# Patient Record
Sex: Female | Born: 1965 | Race: Black or African American | Hispanic: No | Smoking: Never smoker
Health system: Southern US, Community
[De-identification: ages and names within clinical notes are randomized; demographics above are authoritative.]

## PROBLEM LIST (undated history)

## (undated) DIAGNOSIS — G8929 Other chronic pain: Secondary | ICD-10-CM

## (undated) DIAGNOSIS — F450 Somatization disorder: Secondary | ICD-10-CM

## (undated) DIAGNOSIS — R112 Nausea with vomiting, unspecified: Secondary | ICD-10-CM

## (undated) DIAGNOSIS — I10 Essential (primary) hypertension: Secondary | ICD-10-CM

## (undated) DIAGNOSIS — K219 Gastro-esophageal reflux disease without esophagitis: Secondary | ICD-10-CM

## (undated) DIAGNOSIS — E1110 Type 2 diabetes mellitus with ketoacidosis without coma: Secondary | ICD-10-CM

## (undated) DIAGNOSIS — E079 Disorder of thyroid, unspecified: Secondary | ICD-10-CM

## (undated) DIAGNOSIS — R109 Unspecified abdominal pain: Secondary | ICD-10-CM

## (undated) DIAGNOSIS — G4730 Sleep apnea, unspecified: Secondary | ICD-10-CM

## (undated) DIAGNOSIS — K3184 Gastroparesis: Secondary | ICD-10-CM

## (undated) DIAGNOSIS — A419 Sepsis, unspecified organism: Secondary | ICD-10-CM

## (undated) DIAGNOSIS — E785 Hyperlipidemia, unspecified: Secondary | ICD-10-CM

## (undated) DIAGNOSIS — R079 Chest pain, unspecified: Secondary | ICD-10-CM

## (undated) DIAGNOSIS — I4891 Unspecified atrial fibrillation: Secondary | ICD-10-CM

## (undated) DIAGNOSIS — E669 Obesity, unspecified: Secondary | ICD-10-CM

## (undated) DIAGNOSIS — D496 Neoplasm of unspecified behavior of brain: Secondary | ICD-10-CM

## (undated) HISTORY — DX: Obesity, unspecified: E66.9

## (undated) HISTORY — DX: Sleep apnea, unspecified: G47.30

## (undated) HISTORY — DX: Unspecified atrial fibrillation: I48.91

## (undated) HISTORY — PX: CHOLECYSTECTOMY: SHX55

## (undated) HISTORY — PX: NASAL SINUS SURGERY: SHX719

## (undated) HISTORY — DX: Hyperlipidemia, unspecified: E78.5

## (undated) HISTORY — DX: Essential (primary) hypertension: I10

## (undated) HISTORY — DX: Neoplasm of unspecified behavior of brain: D49.6

## (undated) HISTORY — DX: Gastro-esophageal reflux disease without esophagitis: K21.9

## (undated) HISTORY — PX: PARTIAL HYSTERECTOMY: SHX80

## (undated) HISTORY — PX: ABDOMINAL HYSTERECTOMY: SHX81

## (undated) HISTORY — DX: Disorder of thyroid, unspecified: E07.9

---

## 2000-12-10 ENCOUNTER — Encounter: Payer: Self-pay | Admitting: Internal Medicine

## 2000-12-10 ENCOUNTER — Ambulatory Visit (HOSPITAL_COMMUNITY): Admission: RE | Admit: 2000-12-10 | Discharge: 2000-12-10 | Payer: Self-pay | Admitting: Internal Medicine

## 2000-12-17 ENCOUNTER — Ambulatory Visit (HOSPITAL_COMMUNITY): Admission: RE | Admit: 2000-12-17 | Discharge: 2000-12-17 | Payer: Self-pay | Admitting: Internal Medicine

## 2000-12-17 ENCOUNTER — Encounter: Payer: Self-pay | Admitting: Internal Medicine

## 2000-12-18 ENCOUNTER — Encounter: Payer: Self-pay | Admitting: Internal Medicine

## 2001-01-13 ENCOUNTER — Emergency Department (HOSPITAL_COMMUNITY): Admission: EM | Admit: 2001-01-13 | Discharge: 2001-01-13 | Payer: Self-pay | Admitting: Emergency Medicine

## 2002-07-09 ENCOUNTER — Encounter: Payer: Self-pay | Admitting: Internal Medicine

## 2002-07-09 ENCOUNTER — Ambulatory Visit (HOSPITAL_COMMUNITY): Admission: RE | Admit: 2002-07-09 | Discharge: 2002-07-09 | Payer: Self-pay | Admitting: Internal Medicine

## 2003-09-20 ENCOUNTER — Emergency Department (HOSPITAL_COMMUNITY): Admission: EM | Admit: 2003-09-20 | Discharge: 2003-09-20 | Payer: Self-pay | Admitting: Emergency Medicine

## 2004-07-22 ENCOUNTER — Ambulatory Visit (HOSPITAL_BASED_OUTPATIENT_CLINIC_OR_DEPARTMENT_OTHER): Admission: RE | Admit: 2004-07-22 | Discharge: 2004-07-22 | Payer: Self-pay | Admitting: Internal Medicine

## 2004-07-27 ENCOUNTER — Ambulatory Visit: Payer: Self-pay | Admitting: Internal Medicine

## 2004-09-15 ENCOUNTER — Emergency Department (HOSPITAL_COMMUNITY): Admission: EM | Admit: 2004-09-15 | Discharge: 2004-09-15 | Payer: Self-pay | Admitting: Family Medicine

## 2004-11-26 ENCOUNTER — Ambulatory Visit (HOSPITAL_COMMUNITY): Admission: RE | Admit: 2004-11-26 | Discharge: 2004-11-26 | Payer: Self-pay | Admitting: Internal Medicine

## 2006-04-03 ENCOUNTER — Emergency Department (HOSPITAL_COMMUNITY): Admission: EM | Admit: 2006-04-03 | Discharge: 2006-04-03 | Payer: Self-pay | Admitting: Emergency Medicine

## 2006-11-13 ENCOUNTER — Emergency Department (HOSPITAL_COMMUNITY): Admission: EM | Admit: 2006-11-13 | Discharge: 2006-11-13 | Payer: Self-pay | Admitting: Emergency Medicine

## 2007-04-04 ENCOUNTER — Emergency Department (HOSPITAL_COMMUNITY): Admission: EM | Admit: 2007-04-04 | Discharge: 2007-04-04 | Payer: Self-pay | Admitting: Family Medicine

## 2007-04-21 ENCOUNTER — Emergency Department (HOSPITAL_COMMUNITY): Admission: EM | Admit: 2007-04-21 | Discharge: 2007-04-21 | Payer: Self-pay | Admitting: Family Medicine

## 2007-04-22 ENCOUNTER — Emergency Department (HOSPITAL_COMMUNITY): Admission: EM | Admit: 2007-04-22 | Discharge: 2007-04-22 | Payer: Self-pay | Admitting: Emergency Medicine

## 2007-12-28 ENCOUNTER — Emergency Department (HOSPITAL_BASED_OUTPATIENT_CLINIC_OR_DEPARTMENT_OTHER): Admission: EM | Admit: 2007-12-28 | Discharge: 2007-12-28 | Payer: Self-pay | Admitting: Emergency Medicine

## 2008-05-15 ENCOUNTER — Emergency Department (HOSPITAL_COMMUNITY): Admission: EM | Admit: 2008-05-15 | Discharge: 2008-05-15 | Payer: Self-pay | Admitting: Emergency Medicine

## 2008-05-21 ENCOUNTER — Emergency Department (HOSPITAL_COMMUNITY): Admission: EM | Admit: 2008-05-21 | Discharge: 2008-05-21 | Payer: Self-pay | Admitting: Family Medicine

## 2008-09-10 ENCOUNTER — Emergency Department (HOSPITAL_COMMUNITY): Admission: EM | Admit: 2008-09-10 | Discharge: 2008-09-10 | Payer: Self-pay | Admitting: Family Medicine

## 2008-09-11 ENCOUNTER — Emergency Department (HOSPITAL_COMMUNITY): Admission: EM | Admit: 2008-09-11 | Discharge: 2008-09-11 | Payer: Self-pay | Admitting: Family Medicine

## 2008-09-13 ENCOUNTER — Emergency Department (HOSPITAL_COMMUNITY): Admission: EM | Admit: 2008-09-13 | Discharge: 2008-09-13 | Payer: Self-pay | Admitting: Family Medicine

## 2008-12-09 ENCOUNTER — Emergency Department (HOSPITAL_COMMUNITY): Admission: EM | Admit: 2008-12-09 | Discharge: 2008-12-09 | Payer: Self-pay | Admitting: Family Medicine

## 2009-05-07 ENCOUNTER — Emergency Department (HOSPITAL_COMMUNITY): Admission: EM | Admit: 2009-05-07 | Discharge: 2009-05-07 | Payer: Self-pay | Admitting: Family Medicine

## 2009-09-10 ENCOUNTER — Emergency Department (HOSPITAL_COMMUNITY): Admission: EM | Admit: 2009-09-10 | Discharge: 2009-09-10 | Payer: Self-pay | Admitting: Family Medicine

## 2009-09-27 ENCOUNTER — Ambulatory Visit: Payer: Self-pay | Admitting: Family Medicine

## 2009-09-27 DIAGNOSIS — E785 Hyperlipidemia, unspecified: Secondary | ICD-10-CM | POA: Insufficient documentation

## 2009-09-27 DIAGNOSIS — J309 Allergic rhinitis, unspecified: Secondary | ICD-10-CM | POA: Insufficient documentation

## 2009-09-27 DIAGNOSIS — D332 Benign neoplasm of brain, unspecified: Secondary | ICD-10-CM | POA: Insufficient documentation

## 2009-09-27 DIAGNOSIS — J45909 Unspecified asthma, uncomplicated: Secondary | ICD-10-CM | POA: Insufficient documentation

## 2009-09-27 DIAGNOSIS — E119 Type 2 diabetes mellitus without complications: Secondary | ICD-10-CM

## 2009-09-27 DIAGNOSIS — I10 Essential (primary) hypertension: Secondary | ICD-10-CM

## 2009-10-03 ENCOUNTER — Telehealth (INDEPENDENT_AMBULATORY_CARE_PROVIDER_SITE_OTHER): Payer: Self-pay | Admitting: *Deleted

## 2009-10-03 LAB — CONVERTED CEMR LAB
Basophils Absolute: 0 10*3/uL (ref 0.0–0.1)
HCT: 37.1 % (ref 36.0–46.0)
Lymphs Abs: 1.8 10*3/uL (ref 0.7–4.0)
MCV: 89.2 fL (ref 78.0–100.0)
Monocytes Absolute: 0.4 10*3/uL (ref 0.1–1.0)
Monocytes Relative: 7.8 % (ref 3.0–12.0)
Platelets: 309 10*3/uL (ref 150.0–400.0)
RDW: 12.8 % (ref 11.5–14.6)
TSH: 1.07 microintl units/mL (ref 0.35–5.50)
Total Bilirubin: 0.9 mg/dL (ref 0.3–1.2)

## 2009-10-25 ENCOUNTER — Ambulatory Visit: Payer: Self-pay | Admitting: Family Medicine

## 2009-10-25 DIAGNOSIS — R740 Nonspecific elevation of levels of transaminase and lactic acid dehydrogenase [LDH]: Secondary | ICD-10-CM

## 2009-12-26 ENCOUNTER — Ambulatory Visit: Payer: Self-pay | Admitting: Family Medicine

## 2009-12-30 LAB — CONVERTED CEMR LAB
Chloride: 103 meq/L (ref 96–112)
Hgb A1c MFr Bld: 11 % — ABNORMAL HIGH (ref 4.6–6.5)
Potassium: 3.7 meq/L (ref 3.5–5.1)
Sodium: 139 meq/L (ref 135–145)

## 2010-02-17 ENCOUNTER — Ambulatory Visit: Payer: Self-pay | Admitting: Family Medicine

## 2010-02-17 ENCOUNTER — Encounter (INDEPENDENT_AMBULATORY_CARE_PROVIDER_SITE_OTHER): Payer: Self-pay | Admitting: *Deleted

## 2010-02-17 DIAGNOSIS — K219 Gastro-esophageal reflux disease without esophagitis: Secondary | ICD-10-CM | POA: Insufficient documentation

## 2010-04-01 ENCOUNTER — Emergency Department (HOSPITAL_COMMUNITY)
Admission: EM | Admit: 2010-04-01 | Discharge: 2010-04-01 | Payer: Self-pay | Source: Home / Self Care | Admitting: Family Medicine

## 2010-04-02 ENCOUNTER — Ambulatory Visit: Payer: Self-pay | Admitting: Family Medicine

## 2010-04-02 DIAGNOSIS — J019 Acute sinusitis, unspecified: Secondary | ICD-10-CM

## 2010-04-03 ENCOUNTER — Encounter: Payer: Self-pay | Admitting: Family Medicine

## 2010-05-18 LAB — CONVERTED CEMR LAB
Albumin: 3.8 g/dL (ref 3.5–5.2)
Cholesterol: 252 mg/dL — ABNORMAL HIGH (ref 0–200)
Direct LDL: 189.7 mg/dL
Total CHOL/HDL Ratio: 5
Triglycerides: 136 mg/dL (ref 0.0–149.0)
VLDL: 27.2 mg/dL (ref 0.0–40.0)

## 2010-05-20 NOTE — Progress Notes (Signed)
Summary: labs  Phone Note Outgoing Call   Call placed by: Doristine Devoid,  October 03, 2009 11:52 AM Call placed to: Patient Summary of Call: t's DM is out of control.  needs to start Metformin 500mg  two times a day x1 week and then increase to 1000mg  two times a day.  prescribe 1000mg  pills, take 1/2 two times a day x1 week and then 1 two times a day.  liver enzymes also abnormal.  should abstain from tylenol and ETOH and recheck in 2 weeks (or pt's fasting lab draw if more than 1 week from now)   Follow-up for Phone Call        left message on machine ............Marland KitchenDoristine Devoid  October 03, 2009 11:53 AM   spoke w/ patient aware of labs and that additional medication needs to be started also mailed copy of labs .........Marland KitchenDoristine Devoid  October 04, 2009 8:30 AM     New/Updated Medications: METFORMIN HCL 1000 MG TABS (METFORMIN HCL) take one half tablet two times a day x1 week, then 1 tablet two times a day Prescriptions: METFORMIN HCL 1000 MG TABS (METFORMIN HCL) take one half tablet two times a day x1 week, then 1 tablet two times a day  #60 x 3   Entered by:   Doristine Devoid   Authorized by:   Neena Rhymes MD   Signed by:   Doristine Devoid on 10/04/2009   Method used:   Electronically to        [facility]* (retail)       [address]       [address] Shipping/mailing       [place], [place]  [address]       Ph: [phone]       Fax: [phone]   RxID:   [id number]

## 2010-05-20 NOTE — Assessment & Plan Note (Signed)
Summary: cpx & lab/cbs   Vital Signs:  Patient profile:   45 year old female Height:      61.50 inches Weight:      201 pounds BMI:     37.50 Pulse rate:   70 / minute BP sitting:   126 / 80  (left arm)  Vitals Entered By: Doristine Devoid (October 25, 2009 8:13 AM) CC: CPX AND LABS    History of Present Illness: 45 yo woman here today for CPE.  mammogram in Dec at [facility].  1) DM- recent A1C was 12.3.  was previously only taking Glipizide.  pt now on Metformin.  tolerating w/out difficulty.  not checking CBGs.  doesn't have a meter.  denies symptomatic lows.  not exercising.  not following ADA diet.  2) Abormal LFTs- due for recheck  Preventive Screening-Counseling & Management  Alcohol-Tobacco     Alcohol drinks/day: 0     Smoking Status: never  Caffeine-Diet-Exercise     Does Patient Exercise: no      Drug Use:  never.    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 2 Puffs Every 4 Hours As Needed 2)  Lisinopril 5 Mg Tabs (Lisinopril) .... Take One Tablet Daily 3)  Atenolol-Chlorthalidone 50-25 Mg Tabs (Atenolol-Chlorthalidone) .... Take One Tablet Daily 4)  Klor-Con M20 20 Meq Cr-Tabs (Potassium Chloride Crys Cr) .... Take One Tablet Daily 5)  Glipizide 10 Mg Tabs (Glipizide) .... Take One Tablet Daily 6)  Nasonex 50 Mcg/act Susp (Mometasone Furoate) .... 2 Sprays Each Nostril Once Daily 7)  Metformin Hcl 1000 Mg Tabs (Metformin Hcl) .... Take One Half Tablet Two Times A Day X1 Week, Then 1 Tablet Two Times A Day  Allergies (verified): No Known Drug Allergies  Past  History:  Past Medical History: Last updated: 09/27/2009 Asthma Diabetes mellitus, type II Hyperlipidemia Hypertension Cerebellar Tumor- [facility]  Past Surgical History: Last updated: 09/27/2009 Cholecystectomy Hysterectomy-ovaries remain Sinus Surgery- [facility]  Family History: Last updated: 09/27/2009 CAD-father deaceased cardiac issues HTN-father DM-maternal grandparents STROKE-no COLON CA-no BREAST CA-no  Social History: Never Smoked Alcohol use-no Drug use-no works at [employer] Does Patient Exercise:  no Drug Use:  never  Review of Systems  The patient denies anorexia, fever, weight loss, weight gain, vision loss, decreased hearing, hoarseness, chest pain, syncope, dyspnea on exertion, peripheral edema, prolonged cough, headaches, abdominal pain, melena, hematochezia, severe indigestion/heartburn, hematuria, suspicious skin lesions, depression, abnormal bleeding, enlarged lymph nodes, and breast masses.    Physical Exam  General:  Well-developed,well-nourished,in no acute distress; alert,appropriate and cooperative throughout examination Head:  Normocephalic and atraumatic without obvious abnormalities. No apparent alopecia or balding. Eyes:  No corneal or conjunctival inflammation noted. EOMI. Perrla. Funduscopic exam benign, without hemorrhages, exudates or papilledema. Vision grossly normal. Ears:  External ear exam shows no significant lesions or deformities.  Otoscopic examination reveals clear canals, tympanic membranes are intact bilaterally without bulging, retraction, inflammation or discharge. Hearing is grossly normal bilaterally. Nose:  External nasal examination shows no deformity or inflammation. Nasal mucosa are pink and moist without lesions or exudates. Mouth:  Oral mucosa and oropharynx  without lesions or exudates.  Teeth in good repair. Neck:  No deformities, masses, or tenderness noted. Breasts:  No mass, nodules, thickening,  tenderness, bulging, retraction, inflamation, nipple discharge or skin changes noted.   Lungs:  Normal respiratory effort, chest expands symmetrically. Lungs are clear to auscultation, no crackles or wheezes. Heart:  Normal rate and regular rhythm. S1 and S2 normal without gallop, murmur, click, rub or other extra sounds. Abdomen:  Bowel sounds positive,abdomen soft and non-tender without masses, organomegaly or hernias noted. Genitalia:  normal introitus, no external lesions, no vaginal discharge, mucosa pink and moist, and no adnexal masses or tenderness.   Msk:  No deformity or scoliosis noted of thoracic or lumbar spine.   Pulses:  +2 carotid, radial, DP Extremities:  No clubbing, cyanosis, edema, or deformity noted with normal full range of motion of all joints.   Neurologic:  No cranial nerve deficits noted. Station and gait are normal. Plantar reflexes are down-going bilaterally. DTRs are symmetrical throughout. Sensory, motor and coordinative functions appear intact. Skin:  Intact without suspicious lesions or rashes Cervical Nodes:  No lymphadenopathy noted Axillary Nodes:  No palpable lymphadenopathy Psych:  Cognition and judgment appear intact. Alert and cooperative with normal attention span and concentration. No apparent delusions, illusions, hallucinations   Impression & Recommendations:  Problem # 1:  PHYSICAL EXAMINATION (ICD-V70.0) Assessment New pt's PE WNL.  UTD on mammogram.  no need for pap due to hysterectomy.  bimanual exam normal.  anticipatory guidance provided.  Problem # 2:  DIABETES MELLITUS, TYPE II (ICD-250.00) Assessment: Unchanged  pt w/ terrible glycemic control.  reports she doesn't have a meter.  1 provided today along w/ instructions on use.  stressed importance of ADA diet and regular exercise.  will have pt check sugars for 6 weeks and return w/ data.  at that time will likely need to add another med.  will follow closely. Her updated medication list for  this problem includes:    Lisinopril 5 Mg Tabs (Lisinopril) .Marland Kitchen... Take one tablet daily    Glipizide 10 Mg Tabs (Glipizide) .Marland Kitchen... Take one tablet daily    Metformin Hcl 1000 Mg Tabs (Metformin hcl) .Marland Kitchen... Take one half tablet two times a day x1 week, then 1 tablet two times a day  Orders: EKG w/ Interpretation (93000)  Problem # 3:  HYPERLIPIDEMIA (ICD-272.4) Assessment: Unchanged per pt's report.  not currently on meds.  check labs. Orders: Venipuncture (04540) TLB-Lipid Panel (80061-LIPID)  Problem # 4:  TRANSAMINASES, SERUM, ELEVATED (ICD-790.4) Assessment: New LFTs elevated at last check.  repeat today in anticipation of starting a statin. Orders: TLB-Hepatic/Liver Function Pnl (80076-HEPATIC)  Complete Medication List: 1)  Ventolin Hfa 108 (90 Base) Mcg/act Aers (Albuterol sulfate) .... 2 puffs every 4 hours as needed 2)  Lisinopril 5 Mg Tabs (Lisinopril) .... Take one tablet daily 3)  Atenolol-chlorthalidone 50-25 Mg Tabs (Atenolol-chlorthalidone) .... Take one tablet daily 4)  Klor-con M20 20 Meq Cr-tabs (Potassium chloride crys cr) .... Take one tablet daily 5)  Glipizide 10 Mg Tabs (Glipizide) .... Take one tablet daily 6)  Nasonex 50 Mcg/act Susp (Mometasone furoate) .... 2 sprays each nostril once daily 7)  Metformin Hcl 1000 Mg Tabs (Metformin hcl) .... Take one half tablet two times a day x1 week, then 1 tablet two times a day 8)  Onetouch Ultra Test Strp (Glucose blood) .... Use two times a day 9)  Onetouch Delica Lancets Misc (Lancets) .... Use two times a day  Patient Instructions: 1)  Please return in 6 weeks with your meter and your log book to review your diabetes 2)  We'll notify you of your lab results 3)  Check your sugar every morning before eating and then 2 hours after eating either breakfast or dinner 4)  Make sure you are making healthy food choices and try and get regular exercise 5)  Call with any questions or concerns 6)  See you  soon! Prescriptions: ONETOUCH DELICA LANCETS  MISC (LANCETS) use two times a day  #1box x 3   Entered by:   Doristine Devoid   Authorized by:   Neena Rhymes MD   Signed by:   Doristine Devoid on 10/25/2009   Method used:   Electronically to        [facility]* (retail)       [address]       [address] Shipping/mailing       [place], [place]  [address]       Ph: [phone]       Fax: [phone]   RxID:   [id number] [place] TEST  STRP (GLUCOSE BLOOD) use two times a day  #1box x 2   Entered by:   Doristine Devoid   Authorized by:   Neena Rhymes MD   Signed by:   Doristine Devoid on 10/25/2009   Method used:   Electronically to        [facility]* (retail)       [address]       [address] Shipping/mailing       [place], [place]  [address]       Ph: [phone]       Fax: [phone]   RxID:   [id number]

## 2010-05-20 NOTE — Assessment & Plan Note (Signed)
Summary: new to est/cbs   Vital Signs:  Patient profile:   45 year old female Height:      61.50 inches Weight:      196 pounds BMI:     36.57 Pulse rate:   96 / minute BP sitting:   134 / 82  (left arm)  Vitals Entered By: Doristine Devoid (September 27, 2009 10:15 AM) CC: NEW EST- f/u on diabetes    History of Present Illness: 45 yo woman here today to establish care.  Previous MD- Dr Brock Bad, [place].  Health Maintainence- UTD on mammograms, no need for pap- needs bimanual exam.  1) DM- on Glipizide, last A1C 'sometime last year'.  last eye exam 5/10.  not checking sugars regularly.  no symptomatic lows.  no N/V/D, polyuria, polydipsia.  2) HTN- controlled on Lisinopril, Atenolol/Chlorthiadone.  no CP, SOB, edema, HAs, visual changes  3) Allergies- constant nasal drainage, sinus pressure, sneezing, itchy eyes in the AM.  hx of sinus surgery- they told her it would need to be repeated.  4) Asthma- has rescue inhaler, never needed controller medication.  sxs adequately controlled.  5) Cerebellar tumor- reports this has shrunk, only to f/u if again w/ severe HAs, dizziness.  saw [facility]  Preventive Screening-Counseling & Management  Alcohol-Tobacco     Alcohol drinks/day: 0     Smoking Status: never  Caffeine-Diet-Exercise     Does Patient Exercise: yes     Type of exercise: walking      Drug Use:  no.    Current Medications (verified): 1)  Ventolin Hfa 108 (90 Base) Mcg/act Aers (Albuterol Sulfate) .... 2 Puffs Every 4 Hours As Needed 2)  Lisinopril 5 Mg Tabs (Lisinopril) .... Take One Tablet Daily 3)  Atenolol-Chlorthalidone 50-25 Mg Tabs (Atenolol-Chlorthalidone) .... Take One Tablet Daily 4)  Klor-Con M20 20 Meq Cr-Tabs (Potassium Chloride Crys Cr) .... Take One Tablet Daily 5)  Glipizide 10 Mg Tabs (Glipizide) .... Take One Tablet Daily  Allergies (verified): No Known Drug Allergies  Past History:  Past Medical History: Asthma Diabetes mellitus, type  II Hyperlipidemia Hypertension Cerebellar Tumor- [facility]  Past Surgical History: Cholecystectomy Hysterectomy-ovaries remain Sinus Surgery- [facility]  Family History: CAD-father deaceased cardiac issues HTN-father DM-maternal grandparents STROKE-no COLON CA-no BREAST CA-no  Social History: Never Smoked Alcohol use-no Drug use-no Smoking Status:  never Drug Use:  no Does Patient Exercise:  yes  Review of Systems      See HPI  Physical Exam  General:  Well-developed,well-nourished,in no acute distress; alert,appropriate and cooperative throughout examination Head:  Normocephalic and atraumatic without obvious abnormalities. No apparent alopecia or balding. Eyes:  no injxn, inflammation Nose:  turbinate edema and mild congestion Mouth:  + PND Neck:  No deformities, masses, or tenderness noted. Lungs:  Normal respiratory effort, chest expands symmetrically. Lungs are clear to auscultation, no crackles or wheezes. Heart:  Normal rate and regular rhythm. S1 and S2 normal without gallop, murmur, click, rub or other extra sounds. Pulses:  +2 carotid, radial, DP Extremities:  no edema on R, trace edema on L Psych:  Cognition and judgment appear intact. Alert and cooperative with normal attention span and concentration. No apparent delusions, illusions, hallucinations   Impression & Recommendations:  Problem # 1:  DIABETES MELLITUS, TYPE II (ICD-250.00) Assessment New pt due for eye exam, overdue for A1C- check labs today, adjust meds as needed.  encouraged healthy diet and regular exercise. Her updated medication list for this problem includes:    Lisinopril  5 Mg Tabs (Lisinopril) .Marland Kitchen... Take one tablet daily    Glipizide 10 Mg Tabs (Glipizide) .Marland Kitchen... Take one tablet daily  Orders: Venipuncture (38756) TLB-A1C / Hgb A1C (Glycohemoglobin) (83036-A1C) TLB-TSH (Thyroid Stimulating Hormone) (84443-TSH)  Problem # 2:  HYPERTENSION (ICD-401.9) Assessment: New BP  adequate but not ideal.  pt pleased w/ reading today.  continue current meds.  may increase lisinopril in the future if better control is needed. Her updated medication list for this problem includes:    Lisinopril 5 Mg Tabs (Lisinopril) .Marland Kitchen... Take one tablet daily    Atenolol-chlorthalidone 50-25 Mg Tabs (Atenolol-chlorthalidone) .Marland Kitchen... Take one tablet daily  Orders: TLB-CBC Platelet - w/Differential (85025-CBCD)  Problem # 3:  ASTHMA (ICD-493.90) Assessment: New reports sxs well controlled on albuterol as needed.  will follow. Her updated medication list for this problem includes:    Ventolin Hfa 108 (90 Base) Mcg/act Aers (Albuterol sulfate) .Marland Kitchen... 2 puffs every 4 hours as needed  Problem # 4:  HYPERLIPIDEMIA (ICD-272.4) Assessment: New not on meds but has hx by pt report.  not fasting today but will check labs at upcoming CPE. Orders: TLB-Hepatic/Liver Function Pnl (80076-HEPATIC)  Problem # 5:  BENIGN NEOPLASM OF BRAIN (ICD-225.0) Assessment: New had 'complete w/u' per pt, saw [facility].  was told to only f/u if severe HAs or dizziness.  will follow.  Problem # 6:  RHINITIS (ICD-477.9) Assessment: New pt's nasal congestion, drainage, and PND all likely due to allergies.  not currently treated.  start Claritin and Nasonex. Her updated medication list for this problem includes:    Nasonex 50 Mcg/act Susp (Mometasone furoate) .Marland Kitchen... 2 sprays each nostril once daily  Complete Medication List: 1)  Ventolin Hfa 108 (90 Base) Mcg/act Aers (Albuterol sulfate) .... 2 puffs every 4 hours as needed 2)  Lisinopril 5 Mg Tabs (Lisinopril) .... Take one tablet daily 3)  Atenolol-chlorthalidone 50-25 Mg Tabs (Atenolol-chlorthalidone) .... Take one tablet daily 4)  Klor-con M20 20 Meq Cr-tabs (Potassium chloride crys cr) .... Take one tablet daily 5)  Glipizide 10 Mg Tabs (Glipizide) .... Take one tablet daily 6)  Nasonex 50 Mcg/act Susp (Mometasone furoate) .... 2 sprays each nostril once  daily  Patient Instructions: 1)  Please schedule your complete physical in the next few weeks at your convenience- do not eat before this appt 2)  We'll notify you of your lab results 3)  Call and schedule your eye exam 4)  Use the Nasonex as directed and take the Claritin daily for nasal congestion/post nasal drip 5)  Call with any questions or concerns 6)  Welcome!  We're glad to have you! Prescriptions: GLIPIZIDE 10 MG TABS (GLIPIZIDE) take one tablet daily  #90 x 3   Entered and Authorized by:   Neena Rhymes MD   Signed by:   Neena Rhymes MD on 09/27/2009   Method used:   Electronically to        [facility]* (retail)       [address]       [address] Shipping/mailing       [place], [place]  [address]       Ph: [phone]       Fax: [phone]   RxID:   [id number] KLOR-CON M20 20 MEQ CR-TABS (POTASSIUM CHLORIDE CRYS CR) take one tablet daily  #90 x 3   Entered and Authorized by:   Neena Rhymes MD   Signed by:   Neena Rhymes MD on 09/27/2009  Method used:   Electronically to        [facility]* (retail)       [address]       [address] Shipping/mailing       [place], [place]  [address]       Ph: [phone]       Fax: [phone]   RxID:   [id number] ATENOLOL-CHLORTHALIDONE 50-25 MG TABS (ATENOLOL-CHLORTHALIDONE) take one tablet daily  #90 x 3   Entered and Authorized by:   Neena Rhymes MD   Signed by:   Neena Rhymes MD on 09/27/2009   Method used:   Electronically to        [facility]* (retail)       [address]       [address] Shipping/mailing       [place], [place]  [address]       Ph: [phone]       Fax: [phone]   RxID:   [id number] LISINOPRIL 5 MG TABS (LISINOPRIL) take one tablet daily  #90 x 3   Entered and Authorized by:   Neena Rhymes MD   Signed by:   Neena Rhymes MD on 09/27/2009   Method used:   Electronically to         [facility]* (retail)       [address]       [address] Shipping/mailing       [place], [place]  [address]       Ph: [phone]       Fax: [phone]   RxID:   [id number] VENTOLIN HFA 108 (90 BASE) MCG/ACT AERS (ALBUTEROL SULFATE) 2 puffs every 4 hours as needed  #1 x 3   Entered and Authorized by:   Neena Rhymes MD   Signed by:   Neena Rhymes MD on 09/27/2009   Method used:   Electronically to        [facility]* (retail)       [address]       [address] Shipping/mailing       [place], [place]  [address]       Ph: [phone]       Fax: [phone]   RxID:   [id number] NASONEX 50 MCG/ACT SUSP (MOMETASONE FUROATE) 2 sprays each nostril once daily  #1 x 3   Entered and Authorized by:   Neena Rhymes MD   Signed by:   Neena Rhymes MD on 09/27/2009   Method used:   Electronically to        [facility]* (retail)       [address]       [address] Shipping/mailing       [place], [place]  [address]       Ph: [phone]       Fax: [phone]   RxID:   [id number]

## 2010-05-20 NOTE — Assessment & Plan Note (Signed)
Summary: 6 week followup/kn   Vital Signs:  Patient profile:   45 year old Crystal Peck Weight:      209 pounds Pulse rate:   88 / minute BP sitting:   120 / 78  (left arm)  Vitals Entered By: Doristine Devoid CMA (December 26, 2009 2:31 PM) CC: f/u on DM    History of Present Illness: 45 yo woman here today for f/u on  1) DM- A1C 12.3 in June.  started on Metformin.  CBGs 140 after eating by pt report- did not bring meter or log book.  has been walking regularly.  no edema, no CP, SOB, HAs, visual changes.  no symptomatic lows.  has not had eye exam- plans on going on her next day off.  2) Cholesterol- LDL was 189.  goal is <70.  needs to start medication to bring lipids within range.  3) HTN- well controlled.  asymptomatic  Current Medications (verified): 1)  Ventolin Hfa 108 (90 Base) Mcg/act Aers (Albuterol Sulfate) .... 2 Puffs Every 4 Hours As Needed 2)  Lisinopril 5 Mg Tabs (Lisinopril) .... Take One Tablet Daily 3)  Atenolol-Chlorthalidone 50-25 Mg Tabs (Atenolol-Chlorthalidone) .... Take One Tablet Daily 4)  Klor-Con M20 20 Meq Cr-Tabs (Potassium Chloride Crys Cr) .... Take One Tablet Daily 5)  Glipizide 10 Mg Tabs (Glipizide) .... Take One Tablet Daily 6)  Nasonex 50 Mcg/act Susp (Mometasone Furoate) .... 2 Sprays Each Nostril Once Daily 7)  Metformin Hcl 1000 Mg Tabs (Metformin Hcl) .... Then 1 Tablet Two Times A Day 8)  Onetouch Ultra Test  Strp (Glucose Blood) .... Use Two Times A Day 9)  Onetouch Delica Lancets  Misc (Lancets) .... Use Two Times A Day  Allergies (verified): No Known Drug Allergies  Past History:  Past Medical History: Last updated: 09/27/2009 Asthma Diabetes mellitus, type II Hyperlipidemia Hypertension Cerebellar Tumor- [facility]  Review of Systems      See HPI  Physical Exam  General:  Well-developed,well-nourished,in no acute distress; alert,appropriate and cooperative throughout examination Neck:  No deformities, masses, or  tenderness noted. Lungs:  Normal respiratory effort, chest expands symmetrically. Lungs are clear to auscultation, no crackles or wheezes. Heart:  Normal rate and regular rhythm. S1 and S2 normal without gallop, murmur, click, rub or other extra sounds. Pulses:  +2 carotid, radial, DP Extremities:  No clubbing, cyanosis, edema, or deformity noted Psych:  pt w/ poor insight into disease process   Impression & Recommendations:  Problem # 1:  DIABETES MELLITUS, TYPE II (ICD-250.00) Assessment Unchanged pt did not bring meter or log- no actual values to review.  will get A1C to determine med adjustments.  stressed importance of eye exam, regular exercise, and healthy diet.  will follow closely. Her updated medication list for this problem includes:    Lisinopril 5 Mg Tabs (Lisinopril) .Marland Kitchen... Take one tablet daily    Glipizide 10 Mg Tabs (Glipizide) .Marland Kitchen... Take one tablet daily    Metformin Hcl 1000 Mg Tabs (Metformin hcl) .Marland Kitchen... Then 1 tablet two times a day    Onglyza 5 Mg Tabs (Saxagliptin hcl) .Marland Kitchen... Take 1 tab once daily  Orders: Venipuncture ([address]) Specimen Handling ([address]) TLB-BMP (Basic Metabolic Panel-BMET) (80048-METABOL) TLB-A1C / Hgb A1C (Glycohemoglobin) (83036-A1C)  Problem # 2:  HYPERLIPIDEMIA (ICD-272.4) Assessment: Unchanged pt's cholesterol not anywhere near goal.  needs to start crestor as she needs >50% reduction. Her updated medication list for this problem includes:    Crestor 40 Mg Tabs (Rosuvastatin calcium) .Marland KitchenMarland KitchenMarland KitchenMarland Kitchen 1  tablet by mouth daily  Problem # 3:  HYPERTENSION (ICD-401.9) Assessment: Unchanged well controlled today.  asymptomatic. Her updated medication list for this problem includes:    Lisinopril 5 Mg Tabs (Lisinopril) .Marland Kitchen... Take one tablet daily    Atenolol-chlorthalidone 50-25 Mg Tabs (Atenolol-chlorthalidone) .Marland Kitchen... Take one tablet daily  Complete Medication List: 1)  Ventolin Hfa 108 (90 Base) Mcg/act Aers (Albuterol sulfate) .... 2 puffs every 4 hours  as needed 2)  Lisinopril 5 Mg Tabs (Lisinopril) .... Take one tablet daily 3)  Atenolol-chlorthalidone 50-25 Mg Tabs (Atenolol-chlorthalidone) .... Take one tablet daily 4)  Klor-con M20 20 Meq Cr-tabs (Potassium chloride crys cr) .... Take one tablet daily 5)  Glipizide 10 Mg Tabs (Glipizide) .... Take one tablet daily 6)  Nasonex 50 Mcg/act Susp (Mometasone furoate) .... 2 sprays each nostril once daily 7)  Metformin Hcl 1000 Mg Tabs (Metformin hcl) .... Then 1 tablet two times a day 8)  Onetouch Ultra Test Strp (Glucose blood) .... Use two times a day 9)  Onetouch Delica Lancets Misc (Lancets) .... Use two times a day 10)  Crestor 40 Mg Tabs (Rosuvastatin calcium) .Marland Kitchen.. 1 tablet by mouth daily 11)  Onglyza 5 Mg Tabs (Saxagliptin hcl) .... Take 1 tab once daily  Patient Instructions: 1)  Please schedule a follow-up appointment in 6 weeks to check on the diabetes and cholesterol. 2)  We'll call you with your lab results 3)  Start the Crestor nightly- 2 tabs of the samples, 1 tab when you pick up your prescription (40 mg in your dose) 4)  Continue the Metformin and Glipizide 5)  I'm proud of your for exercising!  Keep up the good work! 6)  Schedule your eye exam! 7)  Call with any questions or concerns 8)  Hang in there!!  Prescriptions: CRESTOR 40 MG TABS (ROSUVASTATIN CALCIUM) 1 tablet by mouth daily  #30 x 3   Entered and Authorized by:   Neena Rhymes MD   Signed by:   Neena Rhymes MD on 12/26/2009   Method used:   Electronically to        [facility]* (retail)       [address]       [address] Shipping/mailing       [place], [place]  [address]       Ph: [phone]       Fax: [phone]   RxID:   [id number]

## 2010-05-20 NOTE — Assessment & Plan Note (Signed)
Summary: rto 6 weeks/cbs   Vital Signs:  Patient profile:   45 year old female Weight:      209 pounds Pulse rate:   98 / minute BP sitting:   120 / 80  (left arm)  Vitals Entered By: Doristine Devoid CMA (February 17, 2010 9:52 AM) CC: rx for nexium and f/u on diabetes    CC:  rx for nexium and f/u on diabetes .  History of Present Illness: 45 yo woman here today for   1) DM- has not been check sugars b/c she did not have strips but she did not call office.  CBGs 120 after eating (by pt report) prior to running out of strips.  no symptomatic lows- denies shaky or dizziness.  no CP, SOB, HAs, visual changes, edema  2) Hyperlipidemia- stopped taking Crestor, 'it didn't agree w/ me'.  'i got to hurtin' (legs).  was taking med for 3 days when legs started hurting.  denied abd pain, N/V.  3) GERD- 'i've always had sxs'.  wants script for nexium.  has taken this previously.  Current Medications (verified): 1)  Ventolin Hfa 108 (90 Base) Mcg/act Aers (Albuterol Sulfate) .... 2 Puffs Every 4 Hours As Needed 2)  Lisinopril 5 Mg Tabs (Lisinopril) .... Take One Tablet Daily 3)  Atenolol-Chlorthalidone 50-25 Mg Tabs (Atenolol-Chlorthalidone) .... Take One Tablet Daily 4)  Klor-Con M20 20 Meq Cr-Tabs (Potassium Chloride Crys Cr) .... Take One Tablet Daily 5)  Glipizide 10 Mg Tabs (Glipizide) .... Take One Tablet Daily 6)  Nasonex 50 Mcg/act Susp (Mometasone Furoate) .... 2 Sprays Each Nostril Once Daily 7)  Metformin Hcl 1000 Mg Tabs (Metformin Hcl) .... Then 1 Tablet Two Times A Day 8)  Onetouch Ultra Test  Strp (Glucose Blood) .... Use Two Times A Day 9)  Onetouch Delica Lancets  Misc (Lancets) .... Use Two Times A Day 10)  Onglyza 5 Mg Tabs (Saxagliptin Hcl) .... Take 1 Tab Once Daily 11)  Fish Oil  Oil (Fish Oil) .... Take Three Times A Day 12)  Lipitor 40 Mg Tabs (Atorvastatin Calcium) .... Take 1 Tab By Mouth Daily 13)  Nexium 40 Mg Cpdr (Esomeprazole Magnesium) .... Take 1 Tab Each  Morning  Allergies (verified): No Known Drug Allergies  Past History:  Past medical, surgical, family and social histories (including risk factors) reviewed, and no changes noted (except as noted below).  Past Medical History: Asthma Diabetes mellitus, type II Hyperlipidemia Hypertension Cerebellar Tumor- [facility] GERD  Past Surgical History: Reviewed history from 09/27/2009 and no changes required. Cholecystectomy Hysterectomy-ovaries remain Sinus Surgery- [place]  Family History: Reviewed history from 09/27/2009 and no changes required. CAD-father deaceased cardiac issues HTN-father DM-maternal grandparents STROKE-no COLON CA-no BREAST CA-no  Social History: Reviewed history from 10/25/2009 and no changes required. Never Smoked Alcohol use-no Drug use-no works at [employer]  Review of Systems      See HPI  Physical Exam  General:  Well-developed,well-nourished,in no acute distress; alert,appropriate and cooperative throughout examination Head:  Normocephalic and atraumatic without obvious abnormalities. No apparent alopecia or balding. Eyes:  PERRL, EOMI Neck:  No deformities, masses, or tenderness noted. Lungs:  Normal respiratory effort, chest expands symmetrically. Lungs are clear to auscultation, no crackles or wheezes. Heart:  Normal rate and regular rhythm. S1 and S2 normal without gallop, murmur, click, rub or other extra sounds. Abdomen:  Bowel sounds positive,abdomen soft and non-tender without masses, organomegaly or hernias noted. Pulses:  +2 carotid, radial, DP Extremities:  No clubbing, cyanosis, edema, or deformity noted Cervical Nodes:  No lymphadenopathy noted Psych:  pt w/ poor insight into disease process   Impression & Recommendations:  Problem # 1:  DIABETES MELLITUS, TYPE II (ICD-250.00) Assessment Unchanged pt did not bring meter or log.  ran out of strips (not sure when) but reports CBGs were improved w/  starting Onglyza.  again stressed the importance of compliance w/ meds and management.  pt has been trying to make better food choices.  will follow closely.  if A1C not improved next visit, will refer to Endo. Her updated medication list for this problem includes:    Lisinopril 5 Mg Tabs (Lisinopril) .Marland Kitchen... Take one tablet daily    Glipizide 10 Mg Tabs (Glipizide) .Marland Kitchen... Take one tablet daily    Metformin Hcl 1000 Mg Tabs (Metformin hcl) .Marland Kitchen... Then 1 tablet two times a day    Onglyza 5 Mg Tabs (Saxagliptin hcl) .Marland Kitchen... Take 1 tab once daily  Problem # 2:  HYPERLIPIDEMIA (ICD-272.4) Assessment: Unchanged Pt stopped meds w/out informing us of the side effects.  stressed to her that w/ LDL of 190 and LDL goal of 70, fish oil will not be sufficient.  pt willing to try lipitor and call if she has problems.  will follow. The following medications were removed from the medication list:    Crestor 40 Mg Tabs (Rosuvastatin calcium) .Marland Kitchen... 1 tablet by mouth daily Her updated medication list for this problem includes:    Lipitor 40 Mg Tabs (Atorvastatin calcium) .Marland Kitchen... Take 1 tab by mouth daily  Problem # 3:  GERD (ICD-530.81) Assessment: New nexium script given at pt's request. Her updated medication list for this problem includes:    Nexium 40 Mg Cpdr (Esomeprazole magnesium) .Marland Kitchen... Take 1 tab each morning  Complete Medication List: 1)  Ventolin Hfa 108 (90 Base) Mcg/act Aers (Albuterol sulfate) .... 2 puffs every 4 hours as needed 2)  Lisinopril 5 Mg Tabs (Lisinopril) .... Take one tablet daily 3)  Atenolol-chlorthalidone 50-25 Mg Tabs (Atenolol-chlorthalidone) .... Take one tablet daily 4)  Klor-con M20 20 Meq Cr-tabs (Potassium chloride crys cr) .... Take one tablet daily 5)  Glipizide 10 Mg Tabs (Glipizide) .... Take one tablet daily 6)  Nasonex 50 Mcg/act Susp (Mometasone furoate) .... 2 sprays each nostril once daily 7)  Metformin Hcl 1000 Mg Tabs (Metformin hcl) .... Then 1 tablet two times a  day 8)  Onetouch Ultra Test Strp (Glucose blood) .... Use two times a day 9)  Onetouch Delica Lancets Misc (Lancets) .... Use two times a day 10)  Onglyza 5 Mg Tabs (Saxagliptin hcl) .... Take 1 tab once daily 11)  Fish Oil Oil (Fish oil) .... Take three times a day 12)  Lipitor 40 Mg Tabs (Atorvastatin calcium) .... Take 1 tab by mouth daily 13)  Nexium 40 Mg Cpdr (Esomeprazole magnesium) .... Take 1 tab each morning  Patient Instructions: 1)  Please schedule a follow-up appointment in 1 month for our regular diabetes visit. 2)  Check your sugars regularly and bring your meter to your next appt 3)  Start the Lipitor daily- call me if this doesn't work for you 4)  Continue the Fish Oil 5)  Keep up the good work on healthy diet- this is very important for your diabetes 6)  Call with any questions or concerns 7)  Hang in there!!!  Prescriptions: NEXIUM 40 MG CPDR (ESOMEPRAZOLE MAGNESIUM) Take 1 tab each morning  #30 x 6   Entered and  Authorized by:   Neena Rhymes MD   Signed by:   Neena Rhymes MD on 02/17/2010   Method used:   Electronically to        [facility]* (retail)       [address]       [address] Shipping/mailing       [place], [place]  [address]       Ph: [phone]       Fax: [phone]   RxID:   [id number] LIPITOR 40 MG TABS (ATORVASTATIN CALCIUM) Take 1 tab by mouth daily  #30 x 3   Entered and Authorized by:   Neena Rhymes MD   Signed by:   Neena Rhymes MD on 02/17/2010   Method used:   Electronically to        [facility]* (retail)       [address]       [address] Shipping/mailing       [place], [place]  [address]       Ph: [phone]       Fax: [phone]   RxID:   [id number] ONGLYZA 5 MG TABS (SAXAGLIPTIN HCL) Take 1 tab once daily  #30 x 6   Entered and Authorized by:   Neena Rhymes MD   Signed by:   Neena Rhymes MD on 02/17/2010   Method used:   Electronically to         [facility]* (retail)       [address]       [address] Shipping/mailing       [place], [place]  [address]       Ph: [phone]       Fax: [phone]   RxID:   [id number] Dola Argyle LANCETS  MISC (LANCETS) use two times a day  #1box x 11   Entered and Authorized by:   Neena Rhymes MD   Signed by:   Neena Rhymes MD on 02/17/2010   Method used:   Electronically to        [facility]* (retail)       [address]       [address] Shipping/mailing       [place], [place]  [address]       Ph: [phone]       Fax: [phone]   RxID:   [id number] [place] Bound TEST  STRP (GLUCOSE BLOOD) use two times a day  #1box x 11   Entered and Authorized by:   Neena Rhymes MD   Signed by:   Neena Rhymes MD on 02/17/2010   Method used:   Electronically to        [facility]* (retail)       [address]       [address] Shipping/mailing       [place], [place]  [address]       Ph: [phone]       Fax: [phone]   RxID:   [id number]    Orders Added: 1)  Est. Patient Level IV [[address]]

## 2010-05-20 NOTE — Miscellaneous (Signed)
  Clinical Lists Changes  Observations: Added new observation of FLU VAX: Historical (02/03/2010 14:47)      Immunization History:  Influenza Immunization History:    Influenza:  historical (02/03/2010)

## 2010-05-22 NOTE — Letter (Signed)
Summary: Narda Bonds MD ENT  Narda Bonds MD ENT   Imported By: Lanelle Bal 04/17/2010 09:34:54  _____________________________________________________________________  External Attachment:    Type:   Image     Comment:   External Document

## 2010-05-22 NOTE — Assessment & Plan Note (Signed)
Summary: sinus inf///sph   Vital Signs:  Patient profile:   45 year old female Weight:      207 pounds BMI:     38.62 Temp:     98.4 degrees F oral BP sitting:   120 / 80  (left arm)  Vitals Entered By: Doristine Devoid CMA (April 02, 2010 2:34 PM) CC: sinus infection? L eye swollen on atb drops    History of Present Illness: 45 yo woman here today for ? sinus infxn.  sxs started Sunday w/ itchy L eye, rinsed w/ saline w/out relief.  went to UC on tuesday and started Polymyxin drops and ketorolac.  yesterday eye was swollen.  has ENT appt tomorrow.  having current facial pressure.  hx of sinus surgery.  intermittant ear pain.  Current Medications (verified): 1)  Ventolin Hfa 108 (90 Base) Mcg/act Aers (Albuterol Sulfate) .... 2 Puffs Every 4 Hours As Needed 2)  Lisinopril 5 Mg Tabs (Lisinopril) .... Take One Tablet Daily 3)  Atenolol-Chlorthalidone 50-25 Mg Tabs (Atenolol-Chlorthalidone) .... Take One Tablet Daily 4)  Klor-Con M20 20 Meq Cr-Tabs (Potassium Chloride Crys Cr) .... Take One Tablet Daily 5)  Glipizide 10 Mg Tabs (Glipizide) .... Take One Tablet Daily 6)  Nasonex 50 Mcg/act Susp (Mometasone Furoate) .... 2 Sprays Each Nostril Once Daily 7)  Metformin Hcl 1000 Mg Tabs (Metformin Hcl) .... Then 1 Tablet Two Times A Day 8)  Onetouch Ultra Test  Strp (Glucose Blood) .... Use Two Times A Day 9)  Onetouch Delica Lancets  Misc (Lancets) .... Use Two Times A Day 10)  Onglyza 5 Mg Tabs (Saxagliptin Hcl) .... Take 1 Tab Once Daily 11)  Fish Oil  Oil (Fish Oil) .... Take Three Times A Day 12)  Lipitor 40 Mg Tabs (Atorvastatin Calcium) .... Take 1 Tab By Mouth Daily 13)  Nexium 40 Mg Cpdr (Esomeprazole Magnesium) .... Take 1 Tab Each Morning  Allergies (verified): No Known Drug Allergies  Past History:  Past medical, surgical, family and social histories (including risk factors) reviewed for relevance to current acute and chronic problems.  Past Medical History: Reviewed  history from 02/17/2010 and no changes required. Asthma Diabetes mellitus, type II Hyperlipidemia Hypertension Cerebellar Tumor- [facility] GERD  Past Surgical History: Reviewed history from 09/27/2009 and no changes required. Cholecystectomy Hysterectomy-ovaries remain Sinus Surgery- [place]  Family History: Reviewed history from 09/27/2009 and no changes required. CAD-father deaceased cardiac issues HTN-father DM-maternal grandparents STROKE-no COLON CA-no BREAST CA-no  Social History: Reviewed history from 10/25/2009 and no changes required. Never Smoked Alcohol use-no Drug use-no works at [employer]  Review of Systems      See HPI  Physical Exam  General:  Well-developed,well-nourished,in no acute distress; alert,appropriate and cooperative throughout examination Head:  + TTP over maxillary sinuses Eyes:  L lower lid erythematous, swollen, TTP no injxn or inflammation Ears:  External ear exam shows no significant lesions or deformities.  Otoscopic examination reveals clear canals, tympanic membranes are intact bilaterally without bulging, retraction, inflammation or discharge. Hearing is grossly normal bilaterally. Nose:  External nasal examination shows no deformity or inflammation. Nasal mucosa are pink and moist without lesions or exudates. Mouth:  Oral mucosa and oropharynx without lesions or exudates.  Teeth in good repair. Lungs:  Normal respiratory effort, chest expands symmetrically. Lungs are clear to auscultation, no crackles or wheezes. Heart:  Normal rate and regular rhythm. S1 and S2 normal without gallop, murmur, click, rub or other extra sounds.   Impression &  Recommendations:  Problem # 1:  CELLULITIS, FACE (ICD-682.0) Assessment New pt w/out evidence of pink eye but obvious cellulitis under L eye.  start abx.  hot compresses.  has ENT appt tomorrow. Her updated medication list for this problem includes:    Doxycycline Hyclate  100 Mg Caps (Doxycycline hyclate) .Marland Kitchen... Take 1 tab twice a day.  take w/ food to avoid upset stomach  Problem # 2:  SINUSITIS - ACUTE-NOS (ICD-461.9) Assessment: New + TTP over maxillary sinuses.  start abx.  continue allergy meds. Her updated medication list for this problem includes:    Nasonex 50 Mcg/act Susp (Mometasone furoate) .Marland Kitchen... 2 sprays each nostril once daily    Doxycycline Hyclate 100 Mg Caps (Doxycycline hyclate) .Marland Kitchen... Take 1 tab twice a day.  take w/ food to avoid upset stomach  Complete Medication List: 1)  Ventolin Hfa 108 (90 Base) Mcg/act Aers (Albuterol sulfate) .... 2 puffs every 4 hours as needed 2)  Lisinopril 5 Mg Tabs (Lisinopril) .... Take one tablet daily 3)  Atenolol-chlorthalidone 50-25 Mg Tabs (Atenolol-chlorthalidone) .... Take one tablet daily 4)  Klor-con M20 20 Meq Cr-tabs (Potassium chloride crys cr) .... Take one tablet daily 5)  Glipizide 10 Mg Tabs (Glipizide) .... Take one tablet daily 6)  Nasonex 50 Mcg/act Susp (Mometasone furoate) .... 2 sprays each nostril once daily 7)  Metformin Hcl 1000 Mg Tabs (Metformin hcl) .... Then 1 tablet two times a day 8)  Onetouch Ultra Test Strp (Glucose blood) .... Use two times a day 9)  Onetouch Delica Lancets Misc (Lancets) .... Use two times a day 10)  Onglyza 5 Mg Tabs (Saxagliptin hcl) .... Take 1 tab once daily 11)  Fish Oil Oil (Fish oil) .... Take three times a day 12)  Lipitor 40 Mg Tabs (Atorvastatin calcium) .... Take 1 tab by mouth daily 13)  Nexium 40 Mg Cpdr (Esomeprazole magnesium) .... Take 1 tab each morning 14)  Doxycycline Hyclate 100 Mg Caps (Doxycycline hyclate) .... Take 1 tab twice a day.  take w/ food to avoid upset stomach  Patient Instructions: 1)  This appears to be a cellulitis or skin infection 2)  Take the Doxycycline two times a day as directed- take w/ food to avoid upset stomach 3)  You do not have pink eye so you do not have to use the polymyxin in the eye but you can continue to  use the pain drops 4)  Apply hot compresses to the area for pain relief 5)  Call with any questions or concerns 6)  Hang in there!!! 7)  Happy Holidays! Prescriptions: DOXYCYCLINE HYCLATE 100 MG CAPS (DOXYCYCLINE HYCLATE) Take 1 tab twice a day.  take w/ food to avoid upset stomach  #20 x 0   Entered and Authorized by:   Neena Rhymes MD   Signed by:   Neena Rhymes MD on 04/02/2010   Method used:   Electronically to        [facility] [phone]* (retail)       [address]       [place], [place]  [address]       Ph: [phone]       Fax: [phone]   RxID:   [id number]    Orders Added: 1)  Est. Patient Level III [[address]]

## 2010-06-03 ENCOUNTER — Encounter: Payer: Self-pay | Admitting: Family Medicine

## 2010-06-04 ENCOUNTER — Encounter: Payer: Self-pay | Admitting: Family Medicine

## 2010-06-09 ENCOUNTER — Encounter: Payer: Self-pay | Admitting: Family Medicine

## 2010-06-11 NOTE — Miscellaneous (Signed)
Summary: Diabetes Care Coordinator Requests  Clinical Lists Changes  at request of Dr Eda Keys PharmD will switch pt to Januvia.  will also provide scripts for pt's testing supplies.   Prescriptions: ALCOHOL PREP 70 % PADS (ALCOHOL SWABS) use for diabetic testing.  disp 1 box  #1 x 3   Entered and Authorized by:   Neena Rhymes MD   Signed by:   Neena Rhymes MD on 06/04/2010   Method used:   Electronically to        [facility]* (retail)       [address]       [address] Shipping/mailing       [place], [place]  [address]       Ph: [phone]       Fax: [phone]   RxID:   [id number] LANCETS  MISC (LANCETS) use to test as directed.  disp 1 box  #1 x 3   Entered and Authorized by:   Neena Rhymes MD   Signed by:   Neena Rhymes MD on 06/04/2010   Method used:   Electronically to        [facility]* (retail)       [address]       [address] Shipping/mailing       [place], [place]  [address]       Ph: [phone]       Fax: [phone]   RxID:   [id number] TRUETEST TEST  STRP (GLUCOSE BLOOD) use to test as directed.  disp 1 box  #1 x 6   Entered and Authorized by:   Neena Rhymes MD   Signed by:   Neena Rhymes MD on 06/04/2010   Method used:   Electronically to        [facility]* (retail)       [address]       [address] Shipping/mailing       [place], [place]  [address]       Ph: [phone]       Fax: [phone]   RxID:   [id number] JANUVIA 100 MG TABS (SITAGLIPTIN PHOSPHATE) Take 1 tab by mouth daily  #90 x 3   Entered and Authorized by:   Neena Rhymes MD   Signed by:   Neena Rhymes MD on 06/04/2010   Method used:   Electronically to        [facility]* (retail)       [address]       [address] Shipping/mailing       [place], [place]  [address]       Ph: [phone]       Fax: [phone]   RxID:    [id number]

## 2010-06-12 ENCOUNTER — Encounter (INDEPENDENT_AMBULATORY_CARE_PROVIDER_SITE_OTHER): Payer: Self-pay | Admitting: *Deleted

## 2010-06-17 NOTE — Miscellaneous (Signed)
  Clinical Lists Changes  Observations: Added new observation of MAMMOGRAM: normal (06/09/2010 13:47)      Preventive Care Screening  Mammogram:    Date:  06/09/2010    Results:  normal

## 2010-06-17 NOTE — Miscellaneous (Signed)
Summary: Request for Rx and Progress Note/[facility]  Request for Rx and Progress Note/[facility]   Imported By: Maryln Gottron 06/11/2010 12:39:43  _____________________________________________________________________  External Attachment:    Type:   Image     Comment:   External Document

## 2010-07-29 LAB — CULTURE, ROUTINE-ABSCESS

## 2010-07-29 LAB — GLUCOSE, CAPILLARY
Glucose-Capillary: 211 mg/dL — ABNORMAL HIGH (ref 70–99)
Glucose-Capillary: 366 mg/dL — ABNORMAL HIGH (ref 70–99)

## 2010-09-05 NOTE — Procedures (Signed)
Crystal Peck, Crystal Peck                 ACCOUNT NO.:  [id number]   MEDICAL RECORD NO.:  [id number]          PATIENT TYPE:  OUT   LOCATION:  SLEEP CENTER                 FACILITY:  [facility]   PHYSICIAN:  Clinton D. Maple Hudson, M.D. DATE OF BIRTH:  August 06, 1965   DATE OF STUDY:                              NOCTURNAL POLYSOMNOGRAM   STUDY DATE:  July 22, 2004   REFERRING PHYSICIAN:  Dr. Barney Drain   INDICATION FOR STUDY:  Hypersomnia with sleep apnea.  Epworth Sleepiness  Score 15/24, BMI 43, weight 230 pounds.   SLEEP ARCHITECTURE:  Total sleep time 339 minutes with sleep efficiency 84%.  Stage I was 4%, stage II 78%, stages III and IV were absent, REM was 18% of  total sleep time.  Sleep latency 15 minutes, REM latency 86 minutes, awake  after sleep onset 52 minutes, arousal index 43.  No medication was taken.   RESPIRATORY DATA:  Split-study protocol.  Respiratory disturbance index  (RDI/AHI) 137 obstructive events per hour indicating severe obstructive  sleep apnea/hypopnea syndrome before CPAP.  This included 174 obstructive  apneas and 196 hypopneas before CPAP.  Events were recorded supine and while  on right side.  REM RDI 50 per hour.  CPAP was titrated to 13 CWP, RDI 1 per  hour using a small Respironics ComfortGel Nasal Mask with heated humidifier.   OXYGEN DATA:  Moderate to loud snoring with oxygen desaturation to a nadir  of 64% before CPAP.  After CPAP control saturation held 95-98% on room air.   CARDIAC DATA:  Normal sinus rhythm.   MOVEMENT/PARASOMNIA:  Occasional leg jerk with little effect on sleep.   IMPRESSION/RECOMMENDATION:  1.  Severe obstructive sleep apnea/hypopnea syndrome, respiratory      disturbance index 137 per hour with loud snoring and oxygen desaturation      to 64%.  2.  Successful continuous positive airway pressure titration to 13 CWP,      respiratory disturbance index 1 per hour using a small Respironics      ComfortGel Nasal Mask with heated  humidifier.      CDY/MEDQ  D:  07/27/2004 11:52:02  T:  07/27/2004 13:53:39  Job:  [id number]

## 2010-10-10 ENCOUNTER — Encounter: Payer: Self-pay | Admitting: Family Medicine

## 2010-10-21 ENCOUNTER — Other Ambulatory Visit: Payer: Self-pay | Admitting: Family Medicine

## 2010-10-21 NOTE — Telephone Encounter (Signed)
Pt is overdue for ov, noted on rx and sent refill.

## 2011-01-05 ENCOUNTER — Encounter: Payer: Self-pay | Admitting: Family Medicine

## 2011-01-05 ENCOUNTER — Ambulatory Visit (INDEPENDENT_AMBULATORY_CARE_PROVIDER_SITE_OTHER): Payer: 59 | Admitting: Family Medicine

## 2011-01-05 DIAGNOSIS — I10 Essential (primary) hypertension: Secondary | ICD-10-CM

## 2011-01-05 DIAGNOSIS — E785 Hyperlipidemia, unspecified: Secondary | ICD-10-CM

## 2011-01-05 DIAGNOSIS — E119 Type 2 diabetes mellitus without complications: Secondary | ICD-10-CM

## 2011-01-05 NOTE — Patient Instructions (Signed)
Follow up in 3 months to recheck diabetes We'll notify you of your lab results and make changes to meds if needed Call with any questions or concerns Keep up the good work on diet and exercise- you can do it! Hang in there!

## 2011-01-05 NOTE — Progress Notes (Signed)
  Subjective:    Patient ID: Crystal Peck, female    DOB: 12/22/1965, 45 y.o.   MRN: [id number]  HPI DM- chronic problem for pt, has not been seen since Dec 2011.  On Metformin, Januvia, glipizide.  Reports CBGs are 'pretty good'.  In [facility] program.  Walking regularly.  Goal is to weigh less than 200 lbs.  Not following ADA diet.  Denies symptomatic lows.  Had eye exam at [facility] within the year.  Hyperlipidemia- chronic problem, overdue for labs.  On Lipitor.  Denies abd pain, N/V, myalgias.  HTN- chronic problem, on Tenoretic, Lisinopril.  Well controlled today.  Denies CP, SOB, visual changes, edema.  Review of Systems For ROS see HPI     Objective:   Physical Exam  Constitutional: She is oriented to person, place, and time. She appears well-developed and well-nourished. No distress.  HENT:  Head: Normocephalic and atraumatic.  Eyes: Conjunctivae and EOM are normal. Pupils are equal, round, and reactive to light.  Neck: Normal range of motion. Neck supple. No thyromegaly present.  Cardiovascular: Normal rate, regular rhythm, normal heart sounds and intact distal pulses.   No murmur heard. Pulmonary/Chest: Effort normal and breath sounds normal. No respiratory distress.  Abdominal: Soft. She exhibits no distension. There is no tenderness.  Musculoskeletal: She exhibits no edema.  Lymphadenopathy:    She has no cervical adenopathy.  Neurological: She is alert and oriented to person, place, and time.  Skin: Skin is warm and dry.  Psychiatric: She has a normal mood and affect. Her behavior is normal.          Assessment & Plan:

## 2011-01-06 NOTE — Assessment & Plan Note (Signed)
Chronic problem, well controlled today.  Asymptomatic.  No changes at this time.

## 2011-01-06 NOTE — Assessment & Plan Note (Signed)
Chronic problem, LDL goal is 70 due to DM.  Tolerating current statin w/out difficulty.  Check labs and adjust meds prn.

## 2011-01-06 NOTE — Assessment & Plan Note (Signed)
Chronic problem for pt, typically not well controlled.  Has not been following up as directed.  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  Reports she is working on healthy diet and attempting to increase her amount of exercise.  Applauded these efforts.  Check labs.  Adjust meds prn.

## 2011-01-07 LAB — POCT RAPID STREP A: Streptococcus, Group A Screen (Direct): NEGATIVE

## 2011-01-08 ENCOUNTER — Telehealth: Payer: Self-pay

## 2011-01-08 DIAGNOSIS — Z0279 Encounter for issue of other medical certificate: Secondary | ICD-10-CM

## 2011-01-08 NOTE — Telephone Encounter (Signed)
Left message for pt to return call for clarification of reason for FMLA document completion

## 2011-01-16 ENCOUNTER — Other Ambulatory Visit: Payer: Self-pay | Admitting: Family Medicine

## 2011-01-16 DIAGNOSIS — E119 Type 2 diabetes mellitus without complications: Secondary | ICD-10-CM

## 2011-01-16 DIAGNOSIS — E785 Hyperlipidemia, unspecified: Secondary | ICD-10-CM

## 2011-01-16 DIAGNOSIS — I10 Essential (primary) hypertension: Secondary | ICD-10-CM

## 2011-01-19 ENCOUNTER — Other Ambulatory Visit (INDEPENDENT_AMBULATORY_CARE_PROVIDER_SITE_OTHER): Payer: 59

## 2011-01-19 DIAGNOSIS — E785 Hyperlipidemia, unspecified: Secondary | ICD-10-CM

## 2011-01-19 DIAGNOSIS — E119 Type 2 diabetes mellitus without complications: Secondary | ICD-10-CM

## 2011-01-19 DIAGNOSIS — I10 Essential (primary) hypertension: Secondary | ICD-10-CM

## 2011-01-19 LAB — BASIC METABOLIC PANEL
CO2: 29 mEq/L (ref 19–32)
Chloride: 99 mEq/L (ref 96–112)
Creatinine, Ser: 0.7 mg/dL (ref 0.4–1.2)
Potassium: 3.4 mEq/L — ABNORMAL LOW (ref 3.5–5.1)
Sodium: 138 mEq/L (ref 135–145)

## 2011-01-19 LAB — HEPATIC FUNCTION PANEL
ALT: 25 U/L (ref 0–35)
AST: 16 U/L (ref 0–37)
Albumin: 3.9 g/dL (ref 3.5–5.2)
Alkaline Phosphatase: 88 U/L (ref 39–117)
Total Protein: 7.3 g/dL (ref 6.0–8.3)

## 2011-01-19 LAB — CBC WITH DIFFERENTIAL/PLATELET
Basophils Absolute: 0 10*3/uL (ref 0.0–0.1)
Hemoglobin: 12 g/dL (ref 12.0–15.0)
Lymphocytes Relative: 25.2 % (ref 12.0–46.0)
Monocytes Relative: 7.3 % (ref 3.0–12.0)
Neutro Abs: 4.2 10*3/uL (ref 1.4–7.7)
RDW: 13.6 % (ref 11.5–14.6)

## 2011-01-19 LAB — HEMOGLOBIN A1C: Hgb A1c MFr Bld: 12.4 % — ABNORMAL HIGH (ref 4.6–6.5)

## 2011-01-19 LAB — LIPID PANEL
Cholesterol: 204 mg/dL — ABNORMAL HIGH (ref 0–200)
HDL: 50 mg/dL (ref >39.00)
Total CHOL/HDL Ratio: 4
Triglycerides: 109 mg/dL (ref 0.0–149.0)
VLDL: 21.8 mg/dL (ref 0.0–40.0)

## 2011-01-19 NOTE — Progress Notes (Signed)
Labs only

## 2011-01-20 ENCOUNTER — Telehealth: Payer: Self-pay

## 2011-01-20 NOTE — Progress Notes (Signed)
Quick Note:  Pt aware ______ 

## 2011-01-20 NOTE — Telephone Encounter (Signed)
Message copied by Beverely Low on Tue Jan 20, 2011  3:35 PM ------      Message from: Sheliah Hatch      Created: Mon Jan 19, 2011  3:19 PM       Pt's A1C is worse than previously- 11 --> 12.4  This indicates terrible control of her diabetes.  I fear that she is going to have serious complications unless this gets better.  Please refer to Endo for more intensive treatment.            Based on LDL it seems unlikely she is taking her Lipitor.  Please ask her if she is taking her meds and stress the importance of this.            Thyroid and blood count look fine.

## 2011-01-20 NOTE — Telephone Encounter (Signed)
Left message for pt to call back  °

## 2011-01-20 NOTE — Telephone Encounter (Signed)
Message copied by Beverely Low on Tue Jan 20, 2011  3:56 PM ------      Message from: Sheliah Hatch      Created: Mon Jan 19, 2011  3:19 PM       Pt's A1C is worse than previously- 11 --> 12.4  This indicates terrible control of her diabetes.  I fear that she is going to have serious complications unless this gets better.  Please refer to Endo for more intensive treatment.            Based on LDL it seems unlikely she is taking her Lipitor.  Please ask her if she is taking her meds and stress the importance of this.            Thyroid and blood count look fine.

## 2011-01-20 NOTE — Progress Notes (Signed)
Quick Note:  Left message for pt to call back ______ 

## 2011-01-20 NOTE — Telephone Encounter (Signed)
Pt aware and referral placed.  

## 2011-01-23 LAB — POCT URINALYSIS DIP (DEVICE)
Glucose, UA: >=1000 — AB
Hgb urine dipstick: NEGATIVE
Specific Gravity, Urine: 1.025
Urobilinogen, UA: 0.2

## 2011-01-28 ENCOUNTER — Ambulatory Visit: Payer: 59 | Admitting: Endocrinology

## 2011-01-30 ENCOUNTER — Ambulatory Visit: Payer: 59 | Admitting: Endocrinology

## 2011-02-02 LAB — CULTURE, ROUTINE-ABSCESS

## 2011-02-13 ENCOUNTER — Ambulatory Visit: Payer: 59 | Admitting: Endocrinology

## 2011-02-17 ENCOUNTER — Ambulatory Visit (INDEPENDENT_AMBULATORY_CARE_PROVIDER_SITE_OTHER): Payer: 59 | Admitting: Endocrinology

## 2011-02-17 ENCOUNTER — Encounter: Payer: Self-pay | Admitting: Endocrinology

## 2011-02-17 DIAGNOSIS — E119 Type 2 diabetes mellitus without complications: Secondary | ICD-10-CM

## 2011-02-17 NOTE — Patient Instructions (Addendum)
good diet and exercise habits significanly improve the control of your diabetes.  please let me know if you wish to be referred to a dietician.  high blood sugar is very risky to your health.  you should see an eye doctor every year. controlling your blood pressure and cholesterol drastically reduces the damage diabetes does to your body.  this also applies to quitting smoking.  please discuss these with your doctor.  you should take an aspirin every day, unless you have been advised by a doctor not to. we will need to take this complex situation in stages check your blood sugar 1 time a day.  vary the time of day when you check, between before the 3 meals, and at bedtime.  also check if you have symptoms of your blood sugar being too high or too low.  please keep a record of the readings and bring it to your next appointment here.  please call us sooner if you are having low blood sugar episodes, or if it stays over 200. Please try to remember your meds by taking during your mid-morning break from work, or when you get home. Please come back for a follow-up appointment in 2 weeks

## 2011-02-17 NOTE — Progress Notes (Signed)
Subjective:    Patient ID: Crystal Peck, female    DOB: 1966-01-09, 45 y.o.   MRN: [id number]  HPI pt states 4 years h/o dm.  she is unaware of any chronic complications.  she has never been on insulin.  she takes 3 oral agents.  pt says her diet and exercise are good.  She says she inconsistently takes her meds.  She says she has trouble remembering to take her meds in am, as she works an early shift.   Symptomatically, she has 1 month of moderate congestion at the maxillary areas, and assoc rhinorrhea. Past Medical History  Diagnosis Date  . Asthma   . Diabetes mellitus   . Hyperlipidemia   . Hypertension   . GERD (gastroesophageal reflux disease)   . Cerebellar tumor     Past Surgical History  Procedure Date  . Cholecystectomy   . Partial hysterectomy     ovaries remain  . Nasal sinus surgery     Dr. Marisa Sprinkles    History   Social History  . Marital Status: Legally Separated    Spouse Name: N/A    Number of Children: N/A  . Years of Education: N/A   Occupational History  . Not on file.   Social History Main Topics  . Smoking status: Never Smoker   . Smokeless tobacco: Not on file  . Alcohol Use: No  . Drug Use: No  . Sexually Active:    Other Topics Concern  . Not on file   Social History Narrative  . No narrative on file    Current Outpatient Prescriptions on File Prior to Visit  Medication Sig Dispense Refill  . albuterol (VENTOLIN HFA) 108 (90 BASE) MCG/ACT inhaler Inhale 2 puffs into the lungs every 4 (four) hours as needed.        . Alcohol Swabs (ALCOHOL PREP) 70 % PADS by Does not apply route as directed.        Marland Kitchen atenolol-chlorthalidone (TENORETIC) 50-25 MG per tablet Take 1 tablet by mouth daily.        Marland Kitchen atorvastatin (LIPITOR) 40 MG tablet TAKE 1 TABLET BY MOUTH DAILY  30 tablet  0  . esomeprazole (NEXIUM) 40 MG capsule Take 40 mg by mouth daily before breakfast.        . glipiZIDE (GLUCOTROL) 10 MG tablet Take 10 mg by mouth daily.        Marland Kitchen  glucose blood (TRUETEST TEST) test strip 1 each by Other route as directed. Use as instructed       . glucose blood test strip 1 each by Other route as needed. Onetouch ultra test strip       . Lancet Devices (B-D LANCET DEVICE) MISC by Does not apply route as directed.        Marland Kitchen lisinopril (PRINIVIL,ZESTRIL) 5 MG tablet Take 5 mg by mouth daily.        . metFORMIN (GLUCOPHAGE) 1000 MG tablet TAKE 1/2 TABLETS TWO TIMES A DAY FOR 1 WEEK, THEN 1 TABLET TWO TIMES DAY  60 tablet  0  . mometasone (NASONEX) 50 MCG/ACT nasal spray Place 2 sprays into the nose daily.        . Omega-3 Fatty Acids (FISH OIL) 1000 MG CAPS Take by mouth 3 (three) times daily.        Letta Pate DELICA LANCETS MISC by Does not apply route as directed.        . potassium chloride SA (K-DUR,KLOR-CON) 20 MEQ tablet  Take 20 mEq by mouth daily.        . sitaGLIPtan (JANUVIA) 100 MG tablet Take 100 mg by mouth daily.          No Known Allergies  Family History  Problem Relation Age of Onset  . Coronary artery disease Father   . Hypertension Father   . Diabetes      maternal grandparents  dm: brother  BP 166/98  Pulse 109  Temp(Src) 98 F (36.7 C) (Oral)  Resp 14  Ht 5' 1.5" (1.562 m)  Wt 200 lb (90.719 kg)  BMI 37.18 kg/m2  SpO2 97%  Review of Systems denies weight loss, blurry vision, headache, chest pain, sob, n/v, urinary frequency, cramps, excessive diaphoresis, memory loss, depression, hypoglycemia, easy bruising, and numbness.     Objective:   Physical Exam VS: see vs page GEN: no distress HEAD: head: no deformity eyes: no periorbital swelling, no proptosis external nose and ears are normal mouth: no lesion seen NECK: supple, thyroid is not enlarged CHEST WALL: no deformity LUNGS:  Clear to auscultation CV: reg rate and rhythm, no murmur ABD: abdomen is soft, nontender.  no hepatosplenomegaly.  not distended.  no hernia MUSCULOSKELETAL: muscle bulk and strength are grossly normal.  no obvious joint  swelling.  gait is normal and steady EXTEMITIES: no deformity.  no ulcer on the feet.  feet are of normal color and temp.  no edema PULSES: dorsalis pedis intact bilat.  no carotid bruit NEURO:  cn 2-12 grossly intact.   readily moves all 4's.  sensation is intact to touch on the feet SKIN:  Normal texture and temperature.  No rash or suspicious lesion is visible.   NODES:  None palpable at the neck PSYCH: alert, oriented x3.  Does not appear anxious nor depressed.  Lab Results  Component Value Date   HGBA1C 12.4* 01/19/2011   Lab Results  Component Value Date   ALT 25 01/19/2011   AST 16 01/19/2011   ALKPHOS 88 01/19/2011   BILITOT 0.5 01/19/2011   Lab Results  Component Value Date   CHOL 204* 01/19/2011   HDL 50.00 01/19/2011   LDLDIRECT 146.9 01/19/2011   TRIG 109.0 01/19/2011   CHOLHDL 4 01/19/2011      Assessment & Plan:  Type 2 dm, therapy severely limited by noncompliance.  This causes severe risk to her health.  i'll do the best i can. Htn, therapy severely limited by noncompliance.  i'll do the best i can.  Allergic rhinitis, chronic Dyslipidemia.  Therapy also limited by noncompliance

## 2011-03-02 ENCOUNTER — Ambulatory Visit: Payer: 59 | Admitting: Endocrinology

## 2011-03-16 ENCOUNTER — Ambulatory Visit: Payer: 59 | Admitting: Endocrinology

## 2011-03-30 ENCOUNTER — Ambulatory Visit (INDEPENDENT_AMBULATORY_CARE_PROVIDER_SITE_OTHER): Payer: 59 | Admitting: Endocrinology

## 2011-03-30 ENCOUNTER — Encounter: Payer: Self-pay | Admitting: Endocrinology

## 2011-03-30 VITALS — BP 140/82 | HR 67 | Temp 98.2°F | Ht 61.0 in | Wt 203.0 lb

## 2011-03-30 DIAGNOSIS — E119 Type 2 diabetes mellitus without complications: Secondary | ICD-10-CM

## 2011-03-30 NOTE — Patient Instructions (Addendum)
check your blood sugar 1 time a day.  vary the time of day when you check, between before the 3 meals, and at bedtime.  also check if you have symptoms of your blood sugar being too high or too low.  please keep a record of the readings and bring it to your next appointment here.  please call us sooner if you are having low blood sugar episodes, or if it stays over 200. Please try to remember your meds by taking during your mid-morning break from work, or when you get home. Please come back for a follow-up appointment in 6 weeks.  Please do the a1c a few days prior.

## 2011-03-30 NOTE — Progress Notes (Signed)
Subjective:    Patient ID: Crystal Peck, female    DOB: Sep 06, 1965, 45 y.o.   MRN: [id number]  HPI Pt returns for type 2 DM (2010).  She takes 3 oral agents.  pt states she feels well in general.  Since last ov, she has missed her meds only 3 days. no cbg record, but states cbg's are low to mid-100's.  Past Medical History  Diagnosis Date  . Asthma   . Diabetes mellitus   . Hyperlipidemia   . Hypertension   . GERD (gastroesophageal reflux disease)   . Cerebellar tumor     Past Surgical History  Procedure Date  . Cholecystectomy   . Partial hysterectomy     ovaries remain  . Nasal sinus surgery     Dr. Marisa Sprinkles    History   Social History  . Marital Status: Legally Separated    Spouse Name: N/A    Number of Children: N/A  . Years of Education: N/A   Occupational History  . Not on file.   Social History Main Topics  . Smoking status: Never Smoker   . Smokeless tobacco: Not on file  . Alcohol Use: No  . Drug Use: No  . Sexually Active:    Other Topics Concern  . Not on file   Social History Narrative  . No narrative on file    Current Outpatient Prescriptions on File Prior to Visit  Medication Sig Dispense Refill  . albuterol (VENTOLIN HFA) 108 (90 BASE) MCG/ACT inhaler Inhale 2 puffs into the lungs every 4 (four) hours as needed.        . Alcohol Swabs (ALCOHOL PREP) 70 % PADS by Does not apply route as directed.        Marland Kitchen atenolol-chlorthalidone (TENORETIC) 50-25 MG per tablet Take 1 tablet by mouth daily.        Marland Kitchen atorvastatin (LIPITOR) 40 MG tablet TAKE 1 TABLET BY MOUTH DAILY  30 tablet  0  . esomeprazole (NEXIUM) 40 MG capsule Take 40 mg by mouth daily before breakfast.        . glipiZIDE (GLUCOTROL) 10 MG tablet Take 10 mg by mouth daily.        Marland Kitchen glucose blood (TRUETEST TEST) test strip 1 each by Other route as directed. Use as instructed       . glucose blood test strip 1 each by Other route as needed. Onetouch ultra test strip       . Lancet  Devices (B-D LANCET DEVICE) MISC by Does not apply route as directed.        Marland Kitchen lisinopril (PRINIVIL,ZESTRIL) 5 MG tablet Take 5 mg by mouth daily.        . metFORMIN (GLUCOPHAGE) 1000 MG tablet TAKE 1/2 TABLETS TWO TIMES A DAY FOR 1 WEEK, THEN 1 TABLET TWO TIMES DAY  60 tablet  0  . mometasone (NASONEX) 50 MCG/ACT nasal spray Place 2 sprays into the nose daily.        . Omega-3 Fatty Acids (FISH OIL) 1000 MG CAPS Take by mouth 3 (three) times daily.        Letta Pate DELICA LANCETS MISC by Does not apply route as directed.        . potassium chloride SA (K-DUR,KLOR-CON) 20 MEQ tablet Take 20 mEq by mouth daily.        . sitaGLIPtan (JANUVIA) 100 MG tablet Take 100 mg by mouth daily.          No Known  Allergies  Family History  Problem Relation Age of Onset  . Coronary artery disease Father   . Hypertension Father   . Diabetes      maternal grandparents    BP 140/82  Pulse 67  Temp(Src) 98.2 F (36.8 C) (Oral)  Ht 5\' 1"  (1.549 m)  Wt 203 lb (92.08 kg)  BMI 38.36 kg/m2  SpO2 97%  Review of Systems denies hypoglycemia    Objective:   Physical Exam VITAL SIGNS:  See vs page GENERAL: no distress Trace left leg edema--none on the right.     Assessment & Plan:  Type 2 DM, with apparently improved control, due to improved compliance.

## 2011-04-07 ENCOUNTER — Other Ambulatory Visit: Payer: Self-pay | Admitting: Family Medicine

## 2011-04-08 NOTE — Telephone Encounter (Signed)
rx sent to pharmacy by e-script  

## 2011-05-11 ENCOUNTER — Ambulatory Visit: Payer: 59 | Admitting: Endocrinology

## 2011-06-05 ENCOUNTER — Ambulatory Visit: Payer: 59 | Admitting: Endocrinology

## 2011-09-16 ENCOUNTER — Other Ambulatory Visit: Payer: Self-pay | Admitting: Family Medicine

## 2011-09-16 NOTE — Telephone Encounter (Signed)
Refills x 3 1-*Truetest glucose test strip  Qty 100 Use to test as directed Last fill 12.18.12  2-Januvia 100MG  Tablet  Qty 90 Take one table by mouth daily  Last filled 12.18.12  3-Ventolin HFA 90 MCG Inhalet  Qty 18 Use 2 puffs every 4-hours as needed Last filled 06.10.11  Last OV 9.17.12

## 2011-09-16 NOTE — Telephone Encounter (Signed)
Pt should get diabetes meds/supplies from treating MD (in this case Dr Everardo All)

## 2011-09-16 NOTE — Telephone Encounter (Signed)
Noted pt has seen MD Everardo All 03-30-11, does he need to be the MD to prescribe Januvia and test strips, ok to send ventolin per last refill 09-27-09

## 2011-09-17 ENCOUNTER — Other Ambulatory Visit: Payer: Self-pay | Admitting: *Deleted

## 2011-09-17 MED ORDER — SITAGLIPTIN PHOSPHATE 100 MG PO TABS: 100.0000 mg | ORAL_TABLET | Freq: Every day | ORAL | Status: DC

## 2011-09-17 MED ORDER — GLUCOSE BLOOD VI STRP: ORAL_STRIP | Status: DC

## 2011-09-17 NOTE — Telephone Encounter (Signed)
R'cd fax from [facility] for refill of Januvia and test strips.

## 2011-09-17 NOTE — Telephone Encounter (Signed)
Called [facility] pharmacy to advise DM supplies need to be sent to MD Everardo All per in charge of pt DM, spoke to rep Shanda Bumps to advise the medication refill for the test strips and Januvia to go to MD Everardo All, did give verbal order for the ventolin inhaler, one inhaler with 3 refills, Shanda Bumps advised she will fill the inhaler RX and send the other to MD Everardo All

## 2011-12-18 ENCOUNTER — Telehealth: Payer: Self-pay | Admitting: Family Medicine

## 2011-12-18 MED ORDER — LISINOPRIL 5 MG PO TABS: 5.0000 mg | ORAL_TABLET | Freq: Every day | ORAL | Status: DC

## 2011-12-18 MED ORDER — POTASSIUM CHLORIDE CRYS ER 20 MEQ PO TBCR: 20.0000 meq | EXTENDED_RELEASE_TABLET | Freq: Every day | ORAL | Status: DC

## 2011-12-18 NOTE — Telephone Encounter (Signed)
Refill: Lisinopril 5mg  tablet. Take one tablet by mouth daily. Qty 90. Last fill 06-03-10 Klor-con m20 tablet. Take one tablet by mouth daily. Qty 90. Last fill 09-27-09

## 2011-12-18 NOTE — Telephone Encounter (Signed)
Scheduled pt for upcoming CPE 02-19-12 at 8:30am, MD Tabori advised to send pt #30 with 2 refills to last til OV, pt accepted apt and is aware to be fasting

## 2011-12-18 NOTE — Telephone Encounter (Signed)
Ok for #30, no refills w/out appt b/c pt has not followed up as directed

## 2011-12-18 NOTE — Telephone Encounter (Signed)
FYI: last OV noted as med check on 01-05-11, last OV with Everardo All noted on 03-30-11 and was noted to f/u in 6 weeks, apts cancelled per pt

## 2011-12-23 ENCOUNTER — Telehealth: Payer: Self-pay | Admitting: *Deleted

## 2011-12-23 NOTE — Telephone Encounter (Signed)
Noted incoming fax to advise 2nd refill request for lisinopril, noted sent on 12-17-11, called [facility] and spoke to [place] whom advised that they did receive the RX refill and to disregard the request

## 2011-12-29 ENCOUNTER — Ambulatory Visit (HOSPITAL_BASED_OUTPATIENT_CLINIC_OR_DEPARTMENT_OTHER)
Admission: RE | Admit: 2011-12-29 | Discharge: 2011-12-29 | Disposition: A | Discharge status: Home / Self Care | Payer: 59 | Source: Ambulatory Visit | Attending: Family | Admitting: Family

## 2011-12-29 ENCOUNTER — Encounter: Payer: Self-pay | Admitting: Family

## 2011-12-29 ENCOUNTER — Ambulatory Visit (INDEPENDENT_AMBULATORY_CARE_PROVIDER_SITE_OTHER): Payer: 59 | Admitting: Family

## 2011-12-29 VITALS — BP 140/88 | HR 78 | Temp 98.5°F | Resp 16 | Wt 195.1 lb

## 2011-12-29 DIAGNOSIS — L03116 Cellulitis of left lower limb: Secondary | ICD-10-CM

## 2011-12-29 DIAGNOSIS — M7989 Other specified soft tissue disorders: Secondary | ICD-10-CM | POA: Insufficient documentation

## 2011-12-29 DIAGNOSIS — R609 Edema, unspecified: Secondary | ICD-10-CM

## 2011-12-29 DIAGNOSIS — L02419 Cutaneous abscess of limb, unspecified: Secondary | ICD-10-CM

## 2011-12-29 DIAGNOSIS — M79609 Pain in unspecified limb: Secondary | ICD-10-CM | POA: Insufficient documentation

## 2011-12-29 DIAGNOSIS — E119 Type 2 diabetes mellitus without complications: Secondary | ICD-10-CM | POA: Insufficient documentation

## 2011-12-29 NOTE — Progress Notes (Signed)
Subjective:    Patient ID: Crystal Peck, female    DOB: 02-May-1965, 46 y.o.   MRN: [id number]  HPI  Crystal Peck is a 46 yr old female who presents today with chief complaint of left leg swelling.  She reports that she developed pain in the left leg on 12/23/11.  She denies recent travel or injury.  No recent travel or injury. Pt denies associated chest pain  except "burping".  She also denies associated shortness of breath. Review of Systems See HPI  Past Medical History  Diagnosis Date  . Asthma   . Diabetes mellitus   . Hyperlipidemia   . Hypertension   . GERD (gastroesophageal reflux disease)   . Cerebellar tumor     History   Social History  . Marital Status: Legally Separated    Spouse Name: N/A    Number of Children: N/A  . Years of Education: N/A   Occupational History  . Not on file.   Social History Main Topics  . Smoking status: Never Smoker   . Smokeless tobacco: Not on file  . Alcohol Use: No  . Drug Use: No  . Sexually Active:    Other Topics Concern  . Not on file   Social History Narrative  . No narrative on file    Past Surgical History  Procedure Date  . Cholecystectomy   . Partial hysterectomy     ovaries remain  . Nasal sinus surgery     Dr. Marisa Sprinkles    Family History  Problem Relation Age of Onset  . Coronary artery disease Father   . Hypertension Father   . Diabetes      maternal grandparents    No Known Allergies  Current Outpatient Prescriptions on File Prior to Visit  Medication Sig Dispense Refill  . albuterol (VENTOLIN HFA) 108 (90 BASE) MCG/ACT inhaler Inhale 2 puffs into the lungs every 4 (four) hours as needed.        . Alcohol Swabs (ALCOHOL PREP) 70 % PADS by Does not apply route as directed.        Marland Kitchen atenolol-chlorthalidone (TENORETIC) 50-25 MG per tablet Take 1 tablet by mouth daily.        Marland Kitchen atorvastatin (LIPITOR) 40 MG tablet TAKE 1 TABLET BY MOUTH DAILY  30 tablet  3  . glipiZIDE (GLUCOTROL) 10 MG tablet Take  10 mg by mouth daily.        Marland Kitchen glucose blood (TRUETEST TEST) test strip Use as directed once daily  90 each  1  . glucose blood test strip 1 each by Other route as needed. Onetouch ultra test strip       . Lancet Devices (B-D LANCET DEVICE) MISC by Does not apply route as directed.        Marland Kitchen lisinopril (PRINIVIL,ZESTRIL) 5 MG tablet Take 1 tablet (5 mg total) by mouth daily.  30 tablet  2  . metFORMIN (GLUCOPHAGE) 1000 MG tablet TAKE 1/2 TABLET BY MOUTH TWICE DAILY FOR 1 WEEK, THEN 1 TABLET TWICE DAILY  60 tablet  3  . mometasone (NASONEX) 50 MCG/ACT nasal spray Place 2 sprays into the nose daily.        Marland Kitchen NEXIUM 40 MG capsule TAKE 1 CAPSULE BY MOUTH EACH MORNING  30 capsule  6  . Omega-3 Fatty Acids (FISH OIL) 1000 MG CAPS Take by mouth 3 (three) times daily.        Letta Pate DELICA LANCETS MISC by Does not apply route  as directed.        . potassium chloride SA (K-DUR,KLOR-CON) 20 MEQ tablet Take 1 tablet (20 mEq total) by mouth daily.  30 tablet  2  . sitaGLIPtin (JANUVIA) 100 MG tablet Take 1 tablet (100 mg total) by mouth daily.  30 tablet  6    BP 140/88  Pulse 78  Temp 98.5 F (36.9 C) (Oral)  Resp 16  Wt 195 lb 1.3 oz (88.488 kg)  SpO2 98%       Objective:   Physical Exam  Constitutional: She is oriented to person, place, and time. She appears well-developed and well-nourished. No distress.       AA female.   Cardiovascular: Normal rate and regular rhythm.   No murmur heard. Pulmonary/Chest: Effort normal and breath sounds normal. No respiratory distress. She has no wheezes. She has no rales. She exhibits no tenderness.  Musculoskeletal:       + significant swelling is noted of the left leg extending above the left knee.  Some tenderness to palpation of the left calf.  No visible erythema.  No unusual warmth.  Neurological: She is alert and oriented to person, place, and time.  Skin: Skin is warm and dry. No rash noted. No erythema. No pallor.  Psychiatric: She has a  normal mood and affect. Her behavior is normal. Judgment and thought content normal.          Assessment & Plan:

## 2011-12-29 NOTE — Patient Instructions (Addendum)
Please go to imaging on first floor for ultrasound of your leg. We will contact you with the results.

## 2011-12-30 DIAGNOSIS — L03116 Cellulitis of left lower limb: Secondary | ICD-10-CM | POA: Insufficient documentation

## 2011-12-30 NOTE — Assessment & Plan Note (Signed)
LE doppler is performed and is negative for DVT.  Will plan to treat for cellulitis with keflex QID x 7 days.  Pt instructed to arrange follow up with Dr. Beverely Low in 2 days.

## 2012-01-15 ENCOUNTER — Encounter: Payer: Self-pay | Admitting: *Deleted

## 2012-01-15 ENCOUNTER — Encounter: Payer: 59 | Attending: Family Medicine | Admitting: *Deleted

## 2012-01-15 VITALS — Ht 61.0 in | Wt 194.5 lb

## 2012-01-15 DIAGNOSIS — E669 Obesity, unspecified: Secondary | ICD-10-CM | POA: Insufficient documentation

## 2012-01-15 DIAGNOSIS — E119 Type 2 diabetes mellitus without complications: Secondary | ICD-10-CM | POA: Insufficient documentation

## 2012-01-15 DIAGNOSIS — E785 Hyperlipidemia, unspecified: Secondary | ICD-10-CM | POA: Insufficient documentation

## 2012-01-15 DIAGNOSIS — Z713 Dietary counseling and surveillance: Secondary | ICD-10-CM | POA: Insufficient documentation

## 2012-01-15 DIAGNOSIS — I10 Essential (primary) hypertension: Secondary | ICD-10-CM | POA: Insufficient documentation

## 2012-01-15 NOTE — Progress Notes (Signed)
  Medical Nutrition Therapy:  Appt start time: 0830 end time:  0930.  Assessment:  Primary concerns today: diabetes (HbA1C: 13.7%), obesity, hyperlipidemia.   MEDICATIONS: see list   DIETARY INTAKE:  Usual eating pattern includes 3-4 meals and 1 snacks per day.  Everyday foods include refined carbohydrates, fatty meats, some vegetables.  Avoided foods include- just stopped drinking soda.    24-hr recall:  B ( AM): egg white, cheese toast, 8 oz grits with minute maid juice (20 oz)  Snk ( AM): none  L ( PM): vegetables or meat Snk ( PM): pb and j sandwich or hamburger D ( PM): burger with vegetables Snk ( PM): cookie Beverages: water, juice,   Usual physical activity: walks 20 minutes every day  Estimated energy needs: 1400 calories 158 g carbohydrates 105 g protein 39 g fat  Progress Towards Goal(s):  In progress.   Nutritional Diagnosis:  NB-1.1 Food and nutrition-related knowledge deficit As related to proper balance of fats, proteins, and carbohydrates.  As evidenced by uncontrolled diabetes, hyperlipidemia, HTN, and obesity.    Intervention:  Nutrition counseling provided.  Crystal Peck is here for nutrition counseling.  She is obese, has DM, HTN, and hyperlipidemia.  She reports knowing how to control her HTN and somewhat how to control her cholesterol, but she is not knowledgeable about her diabetes.  Her current diet is very high in refined carbohydrates and fatty meats.  She eats some vegetables, but not enough, and her only fruits come in the form of fruit juice. She walks some.  Discussed physiology of diabetes and role of cholesterol, HTN, and obesity on rising blood sugars and progression of the disease.  Recommended eye exam (she's complaining of declining vision).  Recommended 10-15 pound weight loss for increased insulin sensitivity.  Discussed what foods contain carbohydrates (ie what foods raise blood glucose).  Taught [name] how to count carbs in starches, fruits, and dairy  products.  She says she's recently cut out sodas, doesn't drink tea, but she does drink copious amounts of fruit juice.  She realized she needs to cut that back.  Discussed limiting portions of fats and proteins for weight reduction and lowering cholesterol.  Reviewed reading food labels and discussed MyPlate recommendations for meal planning.    Handouts given during visit include: Living Well with Diabetes Carb Counting and Food Label handouts Meal Plan Card  Monitoring/Evaluation:  Dietary intake, exercise, BGM, and body weight in 1 month(s).

## 2012-01-15 NOTE — Patient Instructions (Addendum)
Goals:  Follow Diabetes Meal Plan as instructed  Eat 3 meals and 2 snacks, every 3-5 hrs  Limit carbohydrate intake to 30-45 grams carbohydrate/meal  Limit carbohydrate intake to 15 grams carbohydrate/snack  Add lean protein foods to meals/snacks  Monitor glucose levels as instructed by your doctor  Aim for 20 mins of physical activity daily  Bring food record and glucose log to your next nutrition visit 

## 2012-02-15 ENCOUNTER — Ambulatory Visit: Payer: 59 | Admitting: *Deleted

## 2012-02-16 ENCOUNTER — Encounter: Payer: 59 | Attending: Family Medicine | Admitting: *Deleted

## 2012-02-16 VITALS — Ht 61.0 in | Wt 194.0 lb

## 2012-02-16 DIAGNOSIS — E785 Hyperlipidemia, unspecified: Secondary | ICD-10-CM | POA: Insufficient documentation

## 2012-02-16 DIAGNOSIS — E669 Obesity, unspecified: Secondary | ICD-10-CM | POA: Insufficient documentation

## 2012-02-16 DIAGNOSIS — I10 Essential (primary) hypertension: Secondary | ICD-10-CM | POA: Insufficient documentation

## 2012-02-16 DIAGNOSIS — Z713 Dietary counseling and surveillance: Secondary | ICD-10-CM | POA: Insufficient documentation

## 2012-02-16 DIAGNOSIS — E119 Type 2 diabetes mellitus without complications: Secondary | ICD-10-CM | POA: Insufficient documentation

## 2012-02-16 NOTE — Patient Instructions (Addendum)
Aim for 30-45 g carbohydrates per meal Aim for 15 g carbohydrates per snack Always have protein at each meal (eggs, nuts, meats, [place] cheese wedges) Continue exercise Continue to monitor portions- use portion guide in brochure Read food labels for carbs, sugars, fiber, and fat.  Look at serving size to determine how much to eat!

## 2012-02-16 NOTE — Progress Notes (Signed)
  Medical Nutrition Therapy:  Appt start time: 1500 end time:  1530.   Assessment:  Primary concerns today: diabetes, hyperlipidemia, HTN, obesity.   MEDICATIONS: see list   DIETARY INTAKE:  Usual eating pattern includes 3 meals and 1 snacks per day.  Everyday foods include lean proteins, starches, fruit and vegetable.  Avoided foods include fatty meats and sweets.    24-hr recall:  B ( AM): egg whites and [place] sausage  Snk ( AM): not usually  L ( PM): bead with rotisserie chicken sometimes has veggies Snk ( PM): pb and j sandwich D ( PM): spaghetti and broccoli; vegetables and chicken or steak Snk ( PM): none usually Beverages: water, tea sometimes, 4 oz juice  Usual physical activity: walks 3 days a week  Estimated energy needs: 1500 calories 170 g carbohydrates 112 g protein 42 g fat  Progress Towards Goal(s):  Some progress.  Increased physical activity by walking 3 days a weeks for 45 minutes and stretching; cut back portions; cut back on fried food   Nutritional Diagnosis:  NB-1.1 Food and nutrition-related knowledge deficit As related to proper balance of fats, proteins, and carbohydrates. As evidenced by uncontrolled diabetes, hyperlipidemia, HTN, and obesity.    Intervention:  Nutrition counseling provided.  Divija reports positive changes in her HbA1C and cholesterol levels.  She's been monitoring her glucose at home BID and reports her values range in the 190-210 mg/dl range.  This is a great improvement from her previous values of 300mg /dl ++.  She's cut back in a lot of ways, but is not counting her carbohydrates.  When quizzed about carb choices and how many grams of carbohydrate she should be consuming, she was not sure.  Reviewed carb counting and how many carbs she should be eating.  Recommended 30-45g/meal and 15g/snack.  Suggested moving 15 grams of carbohydrate to breakfast and 15 to lunch from her afternoon snack and her dinner meals in order to better spread  our her carbohydrates through the day.  Recommended regular activity and minimizing fats.  Reviewed reading food labels.    Handouts given during visit include:  Carb counting brochure  Monitoring/Evaluation:  Dietary intake, exercise, BGM, and body weight in 3 month(s).

## 2012-02-19 ENCOUNTER — Ambulatory Visit (INDEPENDENT_AMBULATORY_CARE_PROVIDER_SITE_OTHER): Payer: 59 | Admitting: Family Medicine

## 2012-02-19 ENCOUNTER — Encounter: Payer: Self-pay | Admitting: Family Medicine

## 2012-02-19 VITALS — BP 132/80 | HR 102 | Temp 98.2°F | Ht 61.5 in | Wt 194.0 lb

## 2012-02-19 DIAGNOSIS — Z0000 Encounter for general adult medical examination without abnormal findings: Secondary | ICD-10-CM | POA: Insufficient documentation

## 2012-02-19 DIAGNOSIS — E119 Type 2 diabetes mellitus without complications: Secondary | ICD-10-CM

## 2012-02-19 DIAGNOSIS — E785 Hyperlipidemia, unspecified: Secondary | ICD-10-CM

## 2012-02-19 DIAGNOSIS — Z23 Encounter for immunization: Secondary | ICD-10-CM

## 2012-02-19 DIAGNOSIS — I10 Essential (primary) hypertension: Secondary | ICD-10-CM

## 2012-02-19 LAB — CBC WITH DIFFERENTIAL/PLATELET
Basophils Absolute: 0.1 10*3/uL (ref 0.0–0.1)
Eosinophils Absolute: 0.1 10*3/uL (ref 0.0–0.7)
MCHC: 32.2 g/dL (ref 30.0–36.0)
MCV: 89.7 fl (ref 78.0–100.0)
Monocytes Absolute: 0.4 10*3/uL (ref 0.1–1.0)
Neutrophils Relative %: 60.5 % (ref 43.0–77.0)
Platelets: 320 10*3/uL (ref 150.0–400.0)
RDW: 12.9 % (ref 11.5–14.6)
WBC: 5.6 10*3/uL (ref 4.5–10.5)

## 2012-02-19 LAB — BASIC METABOLIC PANEL
BUN: 17 mg/dL (ref 6–23)
CO2: 26 mEq/L (ref 19–32)
Calcium: 9.2 mg/dL (ref 8.4–10.5)
Chloride: 102 mEq/L (ref 96–112)
Creatinine, Ser: 0.6 mg/dL (ref 0.4–1.2)
GFR: 127.71 mL/min (ref >60.00)
Glucose, Bld: 255 mg/dL — ABNORMAL HIGH (ref 70–99)
Potassium: 3.5 mEq/L (ref 3.5–5.1)
Sodium: 137 mEq/L (ref 135–145)

## 2012-02-19 LAB — LDL CHOLESTEROL, DIRECT: Direct LDL: 167.7 mg/dL

## 2012-02-19 LAB — HEPATIC FUNCTION PANEL
AST: 19 U/L (ref 0–37)
Alkaline Phosphatase: 88 U/L (ref 39–117)
Bilirubin, Direct: 0 mg/dL (ref 0.0–0.3)
Total Bilirubin: 0.7 mg/dL (ref 0.3–1.2)

## 2012-02-19 LAB — LIPID PANEL
Cholesterol: 223 mg/dL — ABNORMAL HIGH (ref 0–200)
HDL: 46 mg/dL (ref >39.00)
Total CHOL/HDL Ratio: 5
Triglycerides: 94 mg/dL (ref 0.0–149.0)
VLDL: 18.8 mg/dL (ref 0.0–40.0)

## 2012-02-19 NOTE — Progress Notes (Signed)
  Subjective:    Patient ID: Crystal Peck, female    DOB: 28-Aug-1965, 46 y.o.   MRN: [id number]  HPI CPE- pt has not been seen in over a year.  Mammo 07/15/11 at [facility], too young for colonoscopy.  HTN- chronic problem, on lisinopril.  Adequate control.  No CP, SOB, HAs, visual changes, edema.  DM- chronic problem, not well controlled.  Overdue on eye exam-scheduled for today.  On Glipizide, metformin, januvia.  Went and saw nutrition- after the appt saw some improvement in #s.  Home CBGs 'running like 2 something'.  Denies symptomatic lows, dizziness, shaky feelings.  Hyperlipidemia- chronic problem, on Lipitor.  Denies abd pain, N/V, myalgias.   Review of Systems Patient reports no vision/ hearing changes, adenopathy,fever, weight change,  persistant/recurrent hoarseness , swallowing issues, chest pain, palpitations, edema, persistant/recurrent cough, hemoptysis, dyspnea (rest/exertional/paroxysmal nocturnal), gastrointestinal bleeding (melena, rectal bleeding), abdominal pain, significant heartburn, bowel changes, GU symptoms (dysuria, hematuria, incontinence), Gyn symptoms (abnormal  bleeding, pain),  syncope, focal weakness, memory loss, numbness & tingling, skin/hair/nail changes, abnormal bruising or bleeding, anxiety, or depression.     Objective:   Physical Exam General Appearance:    Alert, cooperative, no distress, appears stated age  Head:    Normocephalic, without obvious abnormality, atraumatic  Eyes:    PERRL, conjunctiva/corneas clear, EOM's intact, fundi    benign, both eyes  Ears:    Normal TM's and external ear canals, both ears  Nose:   Nares normal, septum midline, mucosa normal, no drainage    or sinus tenderness  Throat:   Lips, mucosa, and tongue normal; teeth and gums normal  Neck:   Supple, symmetrical, trachea midline, no adenopathy;    Thyroid: no enlargement/tenderness/nodules  Back:     Symmetric, no curvature, ROM normal, no CVA tenderness  Lungs:      Clear to auscultation bilaterally, respirations unlabored  Chest Wall:    No tenderness or deformity   Heart:    Regular rate and rhythm, S1 and S2 normal, no murmur, rub   or gallop  Breast Exam:    Deferred to GYN  Abdomen:     Soft, non-tender, bowel sounds active all four quadrants,    no masses, no organomegaly  Genitalia:    Deferred to GYN  Rectal:    Extremities:   Extremities normal, atraumatic, no cyanosis or edema  Pulses:   2+ and symmetric all extremities  Skin:   Skin color, texture, turgor normal, no rashes or lesions  Lymph nodes:   Cervical, supraclavicular, and axillary nodes normal  Neurologic:   CNII-XII intact, normal strength, sensation and reflexes    throughout          Assessment & Plan:

## 2012-02-19 NOTE — Patient Instructions (Addendum)
Follow up in 3 months to recheck diabetes Keep up the good work on healthy diet and regular exercise We'll notify you of your lab results and make any changes if needed Please have them send me a copy of your eye exam Call with any questions or concerns Happy Fall!!!

## 2012-02-26 ENCOUNTER — Telehealth: Payer: Self-pay | Admitting: Family Medicine

## 2012-02-26 ENCOUNTER — Encounter: Payer: Self-pay | Admitting: General Practice

## 2012-02-26 LAB — VITAMIN D 1,25 DIHYDROXY
Vitamin D 1, 25 (OH)2 Total: 98 pg/mL — ABNORMAL HIGH (ref 18–72)
Vitamin D3 1, 25 (OH)2: 98 pg/mL

## 2012-02-26 MED ORDER — ESOMEPRAZOLE MAGNESIUM 40 MG PO CPDR: DELAYED_RELEASE_CAPSULE | ORAL | Status: DC

## 2012-02-26 MED ORDER — SITAGLIPTIN PHOSPHATE 100 MG PO TABS: 100.0000 mg | ORAL_TABLET | Freq: Every day | ORAL | Status: DC

## 2012-02-26 MED ORDER — LISINOPRIL 5 MG PO TABS: 5.0000 mg | ORAL_TABLET | Freq: Every day | ORAL | Status: DC

## 2012-02-26 MED ORDER — GLIPIZIDE 10 MG PO TABS: 10.0000 mg | ORAL_TABLET | Freq: Every day | ORAL | Status: DC

## 2012-02-26 MED ORDER — METFORMIN HCL 1000 MG PO TABS: ORAL_TABLET | ORAL | Status: DC

## 2012-02-26 MED ORDER — ATENOLOL-CHLORTHALIDONE 50-25 MG PO TABS: 1.0000 | ORAL_TABLET | Freq: Every day | ORAL | Status: DC

## 2012-02-26 MED ORDER — POTASSIUM CHLORIDE CRYS ER 20 MEQ PO TBCR: EXTENDED_RELEASE_TABLET | ORAL | Status: DC

## 2012-02-26 MED ORDER — ATORVASTATIN CALCIUM 40 MG PO TABS: ORAL_TABLET | ORAL | Status: DC

## 2012-02-26 NOTE — Telephone Encounter (Signed)
Patient walk in for her labs result.

## 2012-02-26 NOTE — Telephone Encounter (Signed)
Reviewed lab results with Pt. Pt indicated that she is taking the Lipitor but not regularly. Pt states that she will start taking med daily and will contact endo for a appt since she already seeing Dr Everardo All. Pt is requested refill on all med. Please advise which med you are prescribing for Pt.

## 2012-02-26 NOTE — Telephone Encounter (Signed)
Ok for 3 months of: Lipitor Tenoretic Glipizide Lisinopril Metformin 1000mg  BID Nexium Januvia K Dur

## 2012-02-26 NOTE — Telephone Encounter (Signed)
Meds filled

## 2012-02-28 NOTE — Assessment & Plan Note (Signed)
Chronic problem, pt has hx of noncompliance and has not been seen in over 1 year.  Reports she is taking meds as directed, has seen nutrition.  Check labs.  Adjust meds prn or refer to endo if very poor control.  Pt has eye exam scheduled for today.

## 2012-02-28 NOTE — Assessment & Plan Note (Signed)
Pt's PE WNL.  UTD on mammo.  Check labs.  Stressed importance of routine followup for her multiple medical conditions.  Anticipatory guidance provided.

## 2012-02-28 NOTE — Assessment & Plan Note (Signed)
Chronic problem.  Tolerating statin w/out difficulty.  Check labs.  Adjust meds prn.  When pt called w/ lab results, she indicated she has not been taking statin regularly.  Plans to resume.

## 2012-02-28 NOTE — Assessment & Plan Note (Signed)
Chronic problem.  Adequate control.  Asymptomatic.  Check labs.  No anticipated changes. 

## 2012-04-14 DIAGNOSIS — Z0279 Encounter for issue of other medical certificate: Secondary | ICD-10-CM

## 2012-05-18 ENCOUNTER — Ambulatory Visit: Payer: 59 | Admitting: *Deleted

## 2012-05-23 ENCOUNTER — Ambulatory Visit (INDEPENDENT_AMBULATORY_CARE_PROVIDER_SITE_OTHER): Payer: 59 | Admitting: Family Medicine

## 2012-05-23 ENCOUNTER — Encounter: Payer: Self-pay | Admitting: Family Medicine

## 2012-05-23 VITALS — BP 120/80 | HR 83 | Temp 98.6°F | Ht 61.75 in | Wt 190.6 lb

## 2012-05-23 DIAGNOSIS — E119 Type 2 diabetes mellitus without complications: Secondary | ICD-10-CM

## 2012-05-23 DIAGNOSIS — J45909 Unspecified asthma, uncomplicated: Secondary | ICD-10-CM

## 2012-05-23 DIAGNOSIS — I10 Essential (primary) hypertension: Secondary | ICD-10-CM

## 2012-05-23 LAB — BASIC METABOLIC PANEL
CO2: 28 mEq/L (ref 19–32)
Calcium: 9.1 mg/dL (ref 8.4–10.5)
Creatinine, Ser: 0.6 mg/dL (ref 0.4–1.2)
GFR: 140.12 mL/min (ref >60.00)
Sodium: 138 mEq/L (ref 135–145)

## 2012-05-23 LAB — HEMOGLOBIN A1C: Hgb A1c MFr Bld: 12.6 % — ABNORMAL HIGH (ref 4.6–6.5)

## 2012-05-23 MED ORDER — BECLOMETHASONE DIPROPIONATE 80 MCG/ACT IN AERS: 1.0000 | INHALATION_SPRAY | RESPIRATORY_TRACT | Status: DC | PRN

## 2012-05-23 NOTE — Patient Instructions (Addendum)
Follow up in 3 months to recheck diabetes We'll notify you of your lab results and make any changes if needed Start the Qvar- 1 puff twice daily Call with any questions or concerns Happy New Year!!

## 2012-05-23 NOTE — Assessment & Plan Note (Signed)
Deteriorated.  Pt reports SOB w/ walking from parking lot.  Has never been on daily controller inhaled corticosteroid.  Start Qvar and monitor for decreased albuterol use.  If no improvement, will need pulm referral.  Will follow.

## 2012-05-23 NOTE — Assessment & Plan Note (Signed)
Pt reports CBGs have improved but labs today indicate this is not the case.  A1C has increased.  Pt now needs Endo referral as she likely needs to start insulin.  UTD on eye exam.  Foot exam done today.  Will continue to follow.

## 2012-05-23 NOTE — Assessment & Plan Note (Signed)
Chronic problem.  Adequate control.  Asymptomatic.  No changes. 

## 2012-05-23 NOTE — Progress Notes (Signed)
  Subjective:    Patient ID: Crystal Peck, female    DOB: Dec 20, 1965, 47 y.o.   MRN: [id number]  HPI DM- pt's last A1C was 11.6.  Did not go to Endo as recommended.  Currently seeing DM educator.  On Januvia, Metformin, glipizide.  UTD on eye exam.  CBGs 120-150s.  Has had rare symptomatic lows.  No CP, + SOB, no HAs, visual changes, edema.  No numbness/tingling hands/feet.  HTN- chronic problem, well controlled.  On Tenoretic, lisinopril.  Currently asymptomatic.  Asthma- chronic problem, pt reports breathing has worsened.  Having SOB w/ walking from parking lot.  Using Albuterol daily.  Has not been on daily controller medication.   Review of Systems For ROS see HPI     Objective:   Physical Exam  Vitals reviewed. Constitutional: She is oriented to person, place, and time. She appears well-developed and well-nourished. No distress.  HENT:  Head: Normocephalic and atraumatic.  Eyes: Conjunctivae normal and EOM are normal. Pupils are equal, round, and reactive to light.  Neck: Normal range of motion. Neck supple. No thyromegaly present.  Cardiovascular: Normal rate, regular rhythm, normal heart sounds and intact distal pulses.   No murmur heard. Pulmonary/Chest: Effort normal and breath sounds normal. No respiratory distress.  Abdominal: Soft. She exhibits no distension. There is no tenderness.  Musculoskeletal: She exhibits no edema.  Lymphadenopathy:    She has no cervical adenopathy.  Neurological: She is alert and oriented to person, place, and time.  Skin: Skin is warm and dry.  Psychiatric: She has a normal mood and affect. Her behavior is normal.          Assessment & Plan:

## 2012-07-13 ENCOUNTER — Ambulatory Visit (INDEPENDENT_AMBULATORY_CARE_PROVIDER_SITE_OTHER): Payer: 59 | Admitting: Internal Medicine

## 2012-07-13 ENCOUNTER — Encounter: Payer: Self-pay | Admitting: Internal Medicine

## 2012-07-13 VITALS — BP 128/88 | HR 79 | Temp 98.0°F | Wt 195.0 lb

## 2012-07-13 DIAGNOSIS — R600 Localized edema: Secondary | ICD-10-CM

## 2012-07-13 DIAGNOSIS — R0789 Other chest pain: Secondary | ICD-10-CM

## 2012-07-13 DIAGNOSIS — L299 Pruritus, unspecified: Secondary | ICD-10-CM

## 2012-07-13 DIAGNOSIS — R609 Edema, unspecified: Secondary | ICD-10-CM

## 2012-07-13 LAB — CK TOTAL AND CKMB (NOT AT ARMC)
CK, MB: 1.7 ng/mL (ref 0.3–4.0)
Relative Index: 1.4 (ref 0.0–2.5)
Total CK: 122 U/L (ref 7–177)

## 2012-07-13 NOTE — Progress Notes (Signed)
  Subjective:    Patient ID: Crystal Peck, female    DOB: [birth year], 47 y.o.   MRN: [id number]  HPI  Symptoms began 07/08/12 as soreness in the left lower extremity in the anterior inferior ankle area. The soreness did radiate superiorly but not above the knee. She used a topical agent on this with improvement in the soreness. Associated swelling in the LLE did not respond to the topical agent.  Yesterday she began to have substernal burning discomfort which has been persistent. This was reminiscent of her reflux;but she takes a PPI prn only. This was treated with Rolaids without resolution  Significantly she does have a history of reflux, asthma, and left lower extremity cellulitis.  She has diabetes which is uncontrolled; A1c was 12.6% in February. She is on Januvia, metformin, glipizide, and a long-acting basal insulin. She was referred to Dr Katrinka Blazing , Endocrinologist    Review of Systems Today she developed diffuse itching for which she has taken Zyrtec.  She also describes a burning ear discomfort without discharge.  There has been no associated frontal headache, facial pain, nasal purulence, dental pain, or sore throat.She describes some bitemporal discomfort.     Objective:   Physical Exam  General appearance:good health ;well nourished; no acute distress or increased work of breathing is present.  No  lymphadenopathy about the head, neck, or axilla noted.   Eyes: No conjunctival inflammation or lid edema is present.   Ears:  External ear exam shows no significant lesions or deformities.  Otoscopic examination reveals clear canals, tympanic membranes are intact bilaterally without bulging, retraction, inflammation or discharge.  Nose:  External nasal examination shows no deformity or inflammation. Nasal mucosa are pink and moist without lesions or exudates. No septal dislocation or deviation.No obstruction to airflow.   Oral exam: Dental hygiene is good; lips and gums are healthy  appearing.There is no oropharyngeal erythema or exudate noted.   Neck:  No deformities or masses. Slight  tenderness on R with palpation.    Heart:  Normal rate and regular rhythm. S1 and S2 normal without gallop,  click, rub or other extra sounds. Grade 1/6 systolic murmur  Lungs:Chest clear to auscultation; no wheezes, rhonchi,rales ,or rubs present.No increased work of breathing.    Bowel sounds are normal. Abdomen is protuberant but soft and nontender with no organomegaly, hernias  or masses.  Extremities:  No cyanosis  or clubbing  noted . Homan's negative. Trace -1/2 + edema   Skin: Warm & dry w/o urticaria         Assessment & Plan:  #1 atypical chest pain  #2 GERD; suboptimal maintenance medication adherence  #3 ankle pain and swelling; clinically deep venous thrombosis is not suggested  #4 diabetes, severely uncontrolled with associated increased cardiovascular risk  Plan: See orders and recommendations

## 2012-07-13 NOTE — Patient Instructions (Addendum)
Reflux of gastric acid may be asymptomatic as this may occur mainly during sleep.The triggers for reflux  include stress; the "aspirin family" ; alcohol; peppermint; and caffeine (coffee, tea, cola, and chocolate). The aspirin family would include aspirin and the nonsteroidal agents such as ibuprofen &  Naproxen. Tylenol would not cause reflux. If having symptoms ; food & drink should be avoided for @ least 2 hours before going to bed.   Please take the Prilosec 30 minutes before breakfast and evening meal.   If you activate the  My Chart system; lab & Xray results will be released directly  to you as soon as I review & address these through the computer. If you choose not to sign up for My Chart within 36 hours of labs being drawn; results will be reviewed & interpretation added before being copied & mailed, causing a delay in getting the results to you.If you do not receive that report within 7-10 days ,please call. Additionally you can use this system to gain direct  access to your records  if  out of town or @ an office of a  physician who is not in  the My Chart network.  This improves continuity of care & places you in control of your medical record.

## 2012-07-14 ENCOUNTER — Telehealth: Payer: Self-pay | Admitting: Family Medicine

## 2012-07-14 LAB — CBC WITH DIFFERENTIAL/PLATELET
Basophils Relative: 0.7 % (ref 0.0–3.0)
Eosinophils Absolute: 0.1 10*3/uL (ref 0.0–0.7)
Lymphocytes Relative: 39.1 % (ref 12.0–46.0)
MCHC: 33.2 g/dL (ref 30.0–36.0)
Neutrophils Relative %: 53.1 % (ref 43.0–77.0)
Platelets: 322 10*3/uL (ref 150.0–400.0)
RBC: 3.98 Mil/uL (ref 3.87–5.11)
WBC: 6 10*3/uL (ref 4.5–10.5)

## 2012-07-14 NOTE — Telephone Encounter (Signed)
[facility] Triage Call Report Triage Record Num: [id number] Operator: Tomasita Crumble Patient Name: Crystal Peck Call Date & Time: 07/13/2012 8:57:08PM Patient Phone: [phone] PCP: Jethro Bolus Name: Casimiro Needle Relationship to Patient: Unknown Patient Gender: Female PCP Fax : Patient DOB: 1965-10-01 Practice Name: [facility] Reason for Call: Caller: Montel Clock; PCP: Marga Melnick; CB#: [phone]; Call regarding Manjou is calling from [facility] regarding CK total, CK MB and D-Dimer ordered by Dr. Alwyn Ren. CK total 122, CK-MB 1.7, Relative index 1.4, D-dimer 0.27. These were done STAT and all were within range per caller. Information noted and sent to office for follow up per PCP Calls, No Triage protocol and direction from L. Steuer, RN Protocol(s) Used: PCP Calls, No Triage (Adult) Recommended Outcome per Protocol: Call Provider within 24 Hours Reason for Outcome: Lab calling with test results Care Advice: ~

## 2012-07-14 NOTE — Telephone Encounter (Signed)
She should be considered for cardiac evaluation if her symptoms do not respond to the omeprazole samples one before breakfast and evening meal.

## 2012-07-14 NOTE — Telephone Encounter (Signed)
Left message on VM for patient to return call when available  

## 2012-07-14 NOTE — Telephone Encounter (Signed)
Patient returned your call. CB# [phone]

## 2012-07-14 NOTE — Telephone Encounter (Signed)
Spoke with patient, patient verbalized understanding of Dr.Hopper's instructions.  Patient has pending appointment 08/22/2012 with Dr.Tabori

## 2012-07-14 NOTE — Telephone Encounter (Signed)
Please have pt schedule f/u in 1 month to see if symptoms improve

## 2012-07-14 NOTE — Telephone Encounter (Signed)
Dr Alwyn Ren ordered labs- will forward for review

## 2012-08-18 ENCOUNTER — Telehealth: Payer: Self-pay | Admitting: Family Medicine

## 2012-08-18 NOTE — Telephone Encounter (Signed)
Pt called to give lab results. LM for her to return call.

## 2012-08-18 NOTE — Telephone Encounter (Signed)
Pt came in to office and stated she already had received the results we had in the computer. Pt just turned in the IFOB on Tuesday has an appt with Tabori on Monday.

## 2012-08-18 NOTE — Telephone Encounter (Signed)
Pt called about her recent lab results seeing were they back yet. thanks

## 2012-08-22 ENCOUNTER — Encounter: Payer: Self-pay | Admitting: Family Medicine

## 2012-08-22 ENCOUNTER — Ambulatory Visit (INDEPENDENT_AMBULATORY_CARE_PROVIDER_SITE_OTHER): Payer: 59 | Admitting: Family Medicine

## 2012-08-22 VITALS — BP 140/80 | HR 70 | Temp 98.3°F | Ht 61.75 in | Wt 200.0 lb

## 2012-08-22 DIAGNOSIS — J45909 Unspecified asthma, uncomplicated: Secondary | ICD-10-CM

## 2012-08-22 DIAGNOSIS — I10 Essential (primary) hypertension: Secondary | ICD-10-CM

## 2012-08-22 DIAGNOSIS — E785 Hyperlipidemia, unspecified: Secondary | ICD-10-CM

## 2012-08-22 MED ORDER — ALBUTEROL SULFATE HFA 108 (90 BASE) MCG/ACT IN AERS: 2.0000 | INHALATION_SPRAY | RESPIRATORY_TRACT | Status: DC | PRN

## 2012-08-22 MED ORDER — BECLOMETHASONE DIPROPIONATE 80 MCG/ACT IN AERS: 1.0000 | INHALATION_SPRAY | RESPIRATORY_TRACT | Status: DC | PRN

## 2012-08-22 NOTE — Assessment & Plan Note (Signed)
Chronic problem, adequate control.  Asymptomatic.  No med changes at this time.  Check labs.  Anticipatory guidance provided.

## 2012-08-22 NOTE — Assessment & Plan Note (Signed)
Now following w/ Endo and on insulin.  Pt reports she is being compliant w/ their advice.  Applauded her efforts at compliance.  Will continue to follow along.

## 2012-08-22 NOTE — Patient Instructions (Addendum)
Schedule your complete physical in 6 months We'll notify you of your lab results and make any changes if needed Keep up the good work! Happy Spring!

## 2012-08-22 NOTE — Assessment & Plan Note (Signed)
Chronic problem.  On statin.  Check labs.  Adjust meds prn

## 2012-08-22 NOTE — Assessment & Plan Note (Signed)
Refills provided on inhalers

## 2012-08-22 NOTE — Progress Notes (Signed)
  Subjective:    Patient ID: Crystal Peck, female    DOB: 04-02-1966, 47 y.o.   MRN: [id number]  HPI DM- now following w/ Endo.  On Insulin.  Seeing Diabetes Educator to improve understanding and compliance.  HTN- chronic problem, on Tenoretic and Lisinopril.  Fair control today.  Denies CP, SOB, HAs, visual changes, edema.  Recently started walking.  Hyperlipidemia- chronic problem, on Lipitor.  Denies abd pain, N/V, myalgias.   Review of Systems For ROS see HPI     Objective:   Physical Exam  Vitals reviewed. Constitutional: She is oriented to person, place, and time. She appears well-developed and well-nourished. No distress.  HENT:  Head: Normocephalic and atraumatic.  Eyes: Conjunctivae and EOM are normal. Pupils are equal, round, and reactive to light.  Neck: Normal range of motion. Neck supple. No thyromegaly present.  Cardiovascular: Normal rate, regular rhythm, normal heart sounds and intact distal pulses.   No murmur heard. Pulmonary/Chest: Effort normal and breath sounds normal. No respiratory distress.  Abdominal: Soft. She exhibits no distension. There is no tenderness.  Musculoskeletal: She exhibits no edema.  Lymphadenopathy:    She has no cervical adenopathy.  Neurological: She is alert and oriented to person, place, and time.  Skin: Skin is warm and dry.  Psychiatric: She has a normal mood and affect. Her behavior is normal.          Assessment & Plan:

## 2012-08-23 LAB — BASIC METABOLIC PANEL
BUN: 23 mg/dL (ref 6–23)
Calcium: 9.1 mg/dL (ref 8.4–10.5)
Creatinine, Ser: 0.7 mg/dL (ref 0.4–1.2)
GFR: 113.05 mL/min (ref >60.00)
Glucose, Bld: 130 mg/dL — ABNORMAL HIGH (ref 70–99)
Potassium: 3.4 mEq/L — ABNORMAL LOW (ref 3.5–5.1)

## 2012-08-23 LAB — LIPID PANEL
Cholesterol: 162 mg/dL (ref 0–200)
HDL: 42 mg/dL (ref >39.00)
LDL Cholesterol: 96 mg/dL (ref 0–99)
Triglycerides: 122 mg/dL (ref 0.0–149.0)
VLDL: 24.4 mg/dL (ref 0.0–40.0)

## 2012-08-23 LAB — HEPATIC FUNCTION PANEL
AST: 26 U/L (ref 0–37)
Albumin: 3.9 g/dL (ref 3.5–5.2)
Total Bilirubin: 0.6 mg/dL (ref 0.3–1.2)

## 2012-08-24 ENCOUNTER — Encounter: Payer: Self-pay | Admitting: *Deleted

## 2012-08-25 ENCOUNTER — Other Ambulatory Visit (INDEPENDENT_AMBULATORY_CARE_PROVIDER_SITE_OTHER): Payer: 59

## 2012-08-25 DIAGNOSIS — Z0000 Encounter for general adult medical examination without abnormal findings: Secondary | ICD-10-CM

## 2012-08-25 DIAGNOSIS — Z1289 Encounter for screening for malignant neoplasm of other sites: Secondary | ICD-10-CM

## 2012-08-25 LAB — HEMOCCULT GUIAC POC 1CARD (OFFICE): Card #2 Fecal Occult Blod, POC: NEGATIVE

## 2012-10-23 ENCOUNTER — Ambulatory Visit (INDEPENDENT_AMBULATORY_CARE_PROVIDER_SITE_OTHER): Payer: 59 | Admitting: Internal Medicine

## 2012-10-23 ENCOUNTER — Encounter: Payer: Self-pay | Admitting: Internal Medicine

## 2012-10-23 VITALS — BP 144/84 | HR 80 | Temp 98.1°F | Resp 16 | Ht 61.5 in | Wt 195.4 lb

## 2012-10-23 DIAGNOSIS — H5711 Ocular pain, right eye: Secondary | ICD-10-CM

## 2012-10-23 DIAGNOSIS — B300 Keratoconjunctivitis due to adenovirus: Secondary | ICD-10-CM

## 2012-10-23 DIAGNOSIS — H5710 Ocular pain, unspecified eye: Secondary | ICD-10-CM

## 2012-10-23 MED ORDER — OFLOXACIN 0.3 % OP SOLN: 1.0000 [drp] | OPHTHALMIC | Status: DC

## 2012-10-23 NOTE — Patient Instructions (Addendum)

## 2012-10-23 NOTE — Progress Notes (Signed)
  Subjective:    Patient ID: Crystal Peck, female    DOB: May 18, 1965, 47 y.o.   MRN: [id number]  HPI    Review of Systems     Objective:   Physical Exam Mildly red eye /conjunctiva on right  No vision loss,       Assessment & Plan:  Pain eye right/Probable [facility] Ocuflox and see your opthalmologist

## 2012-11-11 ENCOUNTER — Encounter: Payer: Self-pay | Admitting: Family Medicine

## 2013-01-12 ENCOUNTER — Other Ambulatory Visit: Payer: Self-pay | Admitting: Family Medicine

## 2013-01-12 NOTE — Telephone Encounter (Signed)
Rx filled for 2 month supply. Patient has an upcoming appointment 02/24/13. SW, CMA

## 2013-01-13 ENCOUNTER — Telehealth: Payer: Self-pay | Admitting: General Practice

## 2013-01-13 MED ORDER — PANTOPRAZOLE SODIUM 40 MG PO TBEC: 40.0000 mg | DELAYED_RELEASE_TABLET | Freq: Every day | ORAL | Status: DC

## 2013-01-13 NOTE — Telephone Encounter (Signed)
Med switched and filled. Chart updated.

## 2013-01-13 NOTE — Telephone Encounter (Signed)
Please switch -  

## 2013-01-13 NOTE — Telephone Encounter (Signed)
Received a fax from the pharmacy. In order for pt to have $0 for copay for medication can pt be switched from nexium 40mg  to pantoprazole 40mg ?

## 2013-02-22 ENCOUNTER — Telehealth: Payer: Self-pay

## 2013-02-22 NOTE — Telephone Encounter (Signed)
Left message for call back Non identifiable  Pap--? Flu vaccine? [facility]--10/13/2012 normal [facility]--09/2012--(4-6 wks follow up?)

## 2013-02-24 ENCOUNTER — Encounter: Payer: Self-pay | Admitting: Family Medicine

## 2013-02-24 ENCOUNTER — Ambulatory Visit (INDEPENDENT_AMBULATORY_CARE_PROVIDER_SITE_OTHER): Payer: 59 | Admitting: Family Medicine

## 2013-02-24 ENCOUNTER — Other Ambulatory Visit: Payer: Self-pay | Admitting: General Practice

## 2013-02-24 ENCOUNTER — Ambulatory Visit: Payer: 59 | Admitting: Family Medicine

## 2013-02-24 VITALS — BP 132/80 | HR 85 | Temp 98.3°F | Resp 16 | Ht 62.5 in | Wt 194.0 lb

## 2013-02-24 DIAGNOSIS — IMO0001 Reserved for inherently not codable concepts without codable children: Secondary | ICD-10-CM

## 2013-02-24 DIAGNOSIS — J45909 Unspecified asthma, uncomplicated: Secondary | ICD-10-CM

## 2013-02-24 DIAGNOSIS — Z1331 Encounter for screening for depression: Secondary | ICD-10-CM

## 2013-02-24 DIAGNOSIS — Z0000 Encounter for general adult medical examination without abnormal findings: Secondary | ICD-10-CM

## 2013-02-24 LAB — HEPATIC FUNCTION PANEL
Alkaline Phosphatase: 83 U/L (ref 39–117)
Bilirubin, Direct: 0 mg/dL (ref 0.0–0.3)

## 2013-02-24 LAB — CBC WITH DIFFERENTIAL/PLATELET
Basophils Absolute: 0.1 10*3/uL (ref 0.0–0.1)
Eosinophils Absolute: 0.1 10*3/uL (ref 0.0–0.7)
HCT: 35 % — ABNORMAL LOW (ref 36.0–46.0)
Lymphs Abs: 1.8 10*3/uL (ref 0.7–4.0)
MCHC: 33.1 g/dL (ref 30.0–36.0)
MCV: 86.7 fl (ref 78.0–100.0)
Monocytes Absolute: 0.4 10*3/uL (ref 0.1–1.0)
Monocytes Relative: 5.8 % (ref 3.0–12.0)
Platelets: 314 10*3/uL (ref 150.0–400.0)
RDW: 13.2 % (ref 11.5–14.6)

## 2013-02-24 LAB — HEMOGLOBIN A1C: Hgb A1c MFr Bld: 12.9 % — ABNORMAL HIGH (ref 4.6–6.5)

## 2013-02-24 LAB — BASIC METABOLIC PANEL
Calcium: 9.3 mg/dL (ref 8.4–10.5)
GFR: 120.61 mL/min (ref >60.00)
Sodium: 138 mEq/L (ref 135–145)

## 2013-02-24 LAB — LIPID PANEL
Cholesterol: 240 mg/dL — ABNORMAL HIGH (ref 0–200)
HDL: 49.2 mg/dL (ref >39.00)
Total CHOL/HDL Ratio: 5
Triglycerides: 127 mg/dL (ref 0.0–149.0)

## 2013-02-24 LAB — LDL CHOLESTEROL, DIRECT: Direct LDL: 183.1 mg/dL

## 2013-02-24 MED ORDER — ATORVASTATIN CALCIUM 40 MG PO TABS: ORAL_TABLET | ORAL | Status: DC

## 2013-02-24 MED ORDER — BECLOMETHASONE DIPROPIONATE 80 MCG/ACT IN AERS: 1.0000 | INHALATION_SPRAY | RESPIRATORY_TRACT | Status: DC | PRN

## 2013-02-24 MED ORDER — ATENOLOL-CHLORTHALIDONE 50-25 MG PO TABS: 1.0000 | ORAL_TABLET | Freq: Every day | ORAL | Status: DC

## 2013-02-24 MED ORDER — ALBUTEROL SULFATE HFA 108 (90 BASE) MCG/ACT IN AERS: 2.0000 | INHALATION_SPRAY | RESPIRATORY_TRACT | Status: DC | PRN

## 2013-02-24 MED ORDER — PANTOPRAZOLE SODIUM 40 MG PO TBEC: 40.0000 mg | DELAYED_RELEASE_TABLET | Freq: Every day | ORAL | Status: DC

## 2013-02-24 MED ORDER — LISINOPRIL 5 MG PO TABS: ORAL_TABLET | ORAL | Status: DC

## 2013-02-24 NOTE — Telephone Encounter (Signed)
Unable to reach pre visit.  

## 2013-02-24 NOTE — Assessment & Plan Note (Signed)
Check A1C.  Fax to Endo.

## 2013-02-24 NOTE — Patient Instructions (Signed)
Follow up in 6 months to recheck BP and cholesterol We'll notify you of your lab results and make any changes if needed Try and make healthy food choices and get regular exercise Call with any questions or concerns Happy Holidays! 

## 2013-02-24 NOTE — Progress Notes (Signed)
  Subjective:    Patient ID: Crystal Peck, female    DOB: 06-Nov-1965, 47 y.o.   MRN: [id number]  HPI CPE- UTD on mammo.  No need for pap due to hysterectomy.  Endo- Dr Katrinka Blazing [facility]), last seen May, has upcoming appt   Review of Systems Patient reports no vision/ hearing changes, adenopathy,fever, weight change,  persistant/recurrent hoarseness , swallowing issues, chest pain, palpitations, edema, persistant/recurrent cough, hemoptysis, dyspnea (rest/exertional/paroxysmal nocturnal), gastrointestinal bleeding (melena, rectal bleeding), abdominal pain, significant heartburn, bowel changes, GU symptoms (dysuria, hematuria, incontinence), Gyn symptoms (abnormal  bleeding, pain),  syncope, focal weakness, memory loss, numbness & tingling, skin/hair/nail changes, abnormal bruising or bleeding, anxiety, or depression.     Objective:   Physical Exam  General Appearance:    Alert, cooperative, no distress, appears stated age  Head:    Normocephalic, without obvious abnormality, atraumatic  Eyes:    PERRL, conjunctiva/corneas clear, EOM's intact, fundi    benign, both eyes  Ears:    Normal TM's and external ear canals, both ears  Nose:   Nares normal, septum midline, mucosa normal, no drainage    or sinus tenderness  Throat:   Lips, mucosa, and tongue normal; teeth and gums normal  Neck:   Supple, symmetrical, trachea midline, no adenopathy;    Thyroid: no enlargement/tenderness/nodules  Back:     Symmetric, no curvature, ROM normal, no CVA tenderness  Lungs:     Clear to auscultation bilaterally, respirations unlabored  Chest Wall:    No tenderness or deformity   Heart:    Regular rate and rhythm, S1 and S2 normal, no murmur, rub   or gallop  Breast Exam:    No tenderness, masses, or nipple abnormality  Abdomen:     Soft, non-tender, bowel sounds active all four quadrants,    no masses, no organomegaly  Genitalia:    External genitalia normal, cervix and uterus surgically absent, adnexa  w/out mass or tenderness, mucosa pink and moist, no lesions or discharge present  Rectal:    Normal external appearance  Extremities:   Extremities normal, atraumatic, no cyanosis or edema  Pulses:   2+ and symmetric all extremities  Skin:   Skin color, texture, turgor normal, no rashes or lesions  Lymph nodes:   Cervical, supraclavicular, and axillary nodes normal  Neurologic:   CNII-XII intact, normal strength, sensation and reflexes    throughout          Assessment & Plan:

## 2013-02-24 NOTE — Assessment & Plan Note (Signed)
Pt's PE WNL.  UTD on mammo.  Pelvic exam done today.  Check labs.  Anticipatory guidance provided.

## 2013-02-27 LAB — VITAMIN D 1,25 DIHYDROXY
Vitamin D 1, 25 (OH)2 Total: 102 pg/mL — ABNORMAL HIGH (ref 18–72)
Vitamin D2 1, 25 (OH)2: <8 pg/mL

## 2013-03-02 ENCOUNTER — Encounter: Payer: Self-pay | Admitting: General Practice

## 2013-03-19 ENCOUNTER — Ambulatory Visit (INDEPENDENT_AMBULATORY_CARE_PROVIDER_SITE_OTHER): Payer: 59 | Admitting: Family Medicine

## 2013-03-19 VITALS — BP 156/88 | HR 107 | Temp 98.7°F | Resp 16 | Ht 62.2 in | Wt 190.2 lb

## 2013-03-19 DIAGNOSIS — R599 Enlarged lymph nodes, unspecified: Secondary | ICD-10-CM

## 2013-03-19 DIAGNOSIS — J320 Chronic maxillary sinusitis: Secondary | ICD-10-CM

## 2013-03-19 DIAGNOSIS — R590 Localized enlarged lymph nodes: Secondary | ICD-10-CM

## 2013-03-19 LAB — POCT CBC
Granulocyte percent: 62.3 %G (ref 37–80)
HCT, POC: 39 % (ref 37.7–47.9)
Lymph, poc: 2.1 (ref 0.6–3.4)
MCHC: 30 g/dL — AB (ref 31.8–35.4)
MCV: 92.7 fL (ref 80–97)
MID (cbc): 0.4 (ref 0–0.9)
MPV: 8.4 fL (ref 0–99.8)
POC Granulocyte: 4.2 (ref 2–6.9)
POC LYMPH PERCENT: 31.6 %L (ref 10–50)
POC MID %: 6.1 %M (ref 0–12)
Platelet Count, POC: 300 10*3/uL (ref 142–424)
RDW, POC: 14.1 %

## 2013-03-19 MED ORDER — AMOXICILLIN-POT CLAVULANATE 875-125 MG PO TABS: 1.0000 | ORAL_TABLET | Freq: Two times a day (BID) | ORAL | Status: DC

## 2013-03-19 MED ORDER — IPRATROPIUM BROMIDE 0.03 % NA SOLN: 2.0000 | Freq: Two times a day (BID) | NASAL | Status: DC

## 2013-03-19 NOTE — Progress Notes (Signed)
Subjective:    Patient ID: Crystal Peck, female    DOB: [birth year], 47 y.o.   MRN: [id number]  This chart was scribed for Jamyia Fortune-MD, by Ladona Ridgel Day, Scribe. This patient was seen in room 9 and the patient's care was started at 2:21 PM.  HPI Crystal Peck is a 47 y.o. female who presents to the [facility] complaining of constant, gradually worsened sinus congestion and soreness of her right-lateral upper neck, onset 4 days ago. She reports symptoms are worse on the right side of her face/neck w/soreness and congestion. She states associated flushed/hot feeling, HA, discomfort/"tingling" of her right ear, intermittent rhinorrhea (green/yellow colored nasal d/c) and mild cough (she states was somewhat SOB and fatigued this AM while cleaning her house). She denies any sore throat, ear drainage, fever, nausea/emesis episodes. She states that she did have an episode of diarrhea 3 days ago which has since resolved. She tried taking tylenol cold and sinus medicine w/out any relief.   She also reports constant, aching, bilateral lower back pain, onset 4 days ago. She states her back pain improved slightly w/tylenol. She denies any acute injury. She denies pain radiating into her legs. She denies dysuria or other urinary symptoms.  She is diabetic and reports CBG this AM of 270 at home. She states normally runs about 150. Last A1C in November - 12.9 when she saw Dr. Beverely Low.  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   Past Medical History  Diagnosis Date  . Asthma   . Diabetes mellitus   . Hyperlipidemia   . Hypertension   . GERD (gastroesophageal reflux disease)   . Cerebellar tumor   . Obesity   . Sleep apnea     Past Surgical History  Procedure Laterality Date  . Cholecystectomy    . Partial hysterectomy      ovaries remain  . Nasal sinus surgery      Dr. Marisa Sprinkles  . Abdominal hysterectomy      Family History  Problem Relation Age of Onset  .  Coronary artery disease Father   . Hypertension Father   . Diabetes      maternal grandparents  . Hyperlipidemia Other     History   Social History  . Marital Status: Legally Separated    Spouse Name: N/A    Number of Children: N/A  . Years of Education: N/A   Occupational History  . Not on file.   Social History Main Topics  . Smoking status: Never Smoker   . Smokeless tobacco: Not on file  . Alcohol Use: No  . Drug Use: No  . Sexual Activity:    Other Topics Concern  . Not on file   Social History Narrative  . No narrative on file    No Known Allergies  Patient Active Problem List   Diagnosis Date Noted  . Type II or unspecified type diabetes mellitus without mention of complication, uncontrolled 07/13/2012  . Left leg cellulitis 12/30/2011  . GERD 02/17/2010  . TRANSAMINASES, SERUM, ELEVATED 10/25/2009  . BENIGN NEOPLASM OF BRAIN 09/27/2009  . HYPERLIPIDEMIA 09/27/2009  . HYPERTENSION 09/27/2009  . RHINITIS 09/27/2009  . ASTHMA 09/27/2009     Review of Systems  Constitutional: Positive for fatigue. Negative for fever and chills.  HENT: Positive for congestion, ear pain (right ear feels "tingly") and rhinorrhea. Negative for ear discharge, sore throat and tinnitus.   Respiratory: Positive for cough. Negative for shortness  of breath.   Cardiovascular: Negative for chest pain.  Gastrointestinal: Positive for diarrhea. Negative for nausea, vomiting and abdominal pain.  Genitourinary: Negative for dysuria.  Musculoskeletal: Positive for back pain (bilateral lower back pain). Negative for gait problem, neck pain and neck stiffness.  Skin: Negative for color change.  Neurological: Positive for headaches. Negative for syncope, weakness and numbness.   Triage Vitals: BP 156/88  Pulse 107  Temp(Src) 98.7 F (37.1 C) (Oral)  Resp 16  Ht 5' 2.2" (1.58 m)  Wt 190 lb 3.2 oz (86.274 kg)  BMI 34.56 kg/m2  SpO2 97%     Objective:   Physical Exam  Nursing note  and vitals reviewed. Constitutional: She is oriented to person, place, and time. She appears well-developed and well-nourished. No distress.  HENT:  Head: Normocephalic and atraumatic.  Right Ear: External ear normal.  Left Ear: External ear normal.  Mouth/Throat: Oropharynx is clear and moist. No oropharyngeal exudate.  Drainage in nose bilaterally.  Posterior oropharynx is clear, no erythema or exudates. Tender of frontal and maxillary sinuses on right side  Eyes: Conjunctivae are normal. Right eye exhibits no discharge. Left eye exhibits no discharge.  Neck: Normal range of motion. No tracheal deviation present.  Cervical adenopathy right worse than left. No pain w/flexion of neck. Neck is supple.   Cardiovascular: Normal rate, regular rhythm and normal heart sounds.   No murmur heard. Pulmonary/Chest: Effort normal and breath sounds normal. No respiratory distress. She has no wheezes. She has no rales.  Musculoskeletal: Normal range of motion. She exhibits no edema.  Full ROM of back w/rotation and forward flexion of spine    Lymphadenopathy:    She has cervical adenopathy.  Neurological: She is alert and oriented to person, place, and time.  Skin: Skin is warm and dry. No rash noted.  Psychiatric: She has a normal mood and affect. Thought content normal.   Results for orders placed in visit on 03/19/13  POCT CBC      Result Value Range   WBC 6.8  4.6 - 10.2 K/uL   Lymph, poc 2.1  0.6 - 3.4   POC LYMPH PERCENT 31.6  10 - 50 %L   MID (cbc) 0.4  0 - 0.9   POC MID % 6.1  0 - 12 %M   POC Granulocyte 4.2  2 - 6.9   Granulocyte percent 62.3  37 - 80 %G   RBC 4.21  4.04 - 5.48 M/uL   Hemoglobin 11.7 (*) 12.2 - 16.2 g/dL   HCT, POC 16.1  09.6 - 47.9 %   MCV 92.7  80 - 97 fL   MCH, POC 27.8  27 - 31.2 pg   MCHC 30.0 (*) 31.8 - 35.4 g/dL   RDW, POC 04.5     Platelet Count, POC 300  142 - 424 K/uL   MPV 8.4  0 - 99.8 fL  GLUCOSE, POCT (MANUAL RESULT ENTRY)      Result Value  Range   POC Glucose 276 (*) 70 - 99 mg/dl      Assessment & Plan:   1. Maxillary sinusitis   2. Type II or unspecified type diabetes mellitus without mention of complication, uncontrolled   3. Adenopathy, cervical   4. LAD (lymphadenopathy), cervical    1. Acute maxillary sinusitis:  New.  Rx for Augmentin provided; Atrovent nasal spray also provided. 2. Cervical LAD R anterior:  New.  Associated with sinusitis; rx for Augmentin provided; should expect complete resolution  in 2-3 weeks. 3. DMII: uncontrolled but chronic issue; recent worsening with acute infection; monitor closely with acute infection.  Meds ordered this encounter  Medications  . amoxicillin-clavulanate (AUGMENTIN) 875-125 MG per tablet    Sig: Take 1 tablet by mouth 2 (two) times daily.    Dispense:  20 tablet    Refill:  0  . ipratropium (ATROVENT) 0.03 % nasal spray    Sig: Place 2 sprays into the nose 2 (two) times daily.    Dispense:  30 mL    Refill:  5    I personally performed the services described in this documentation, which was scribed in my presence.  The recorded information has been reviewed and is accurate.

## 2013-05-10 ENCOUNTER — Encounter: Payer: Self-pay | Admitting: Family Medicine

## 2013-05-16 ENCOUNTER — Other Ambulatory Visit: Payer: Self-pay | Admitting: Family Medicine

## 2013-05-17 NOTE — Telephone Encounter (Signed)
Glipizide and Metformin refilled per protocol. JG//CMA

## 2013-06-01 ENCOUNTER — Emergency Department (HOSPITAL_COMMUNITY)
Admission: EM | Admit: 2013-06-01 | Discharge: 2013-06-01 | Disposition: A | Discharge status: Home / Self Care | Payer: 59 | Attending: Emergency Medicine | Admitting: Emergency Medicine

## 2013-06-01 ENCOUNTER — Emergency Department (HOSPITAL_COMMUNITY): Payer: 59

## 2013-06-01 ENCOUNTER — Encounter (HOSPITAL_COMMUNITY): Payer: Self-pay | Admitting: Emergency Medicine

## 2013-06-01 DIAGNOSIS — J45901 Unspecified asthma with (acute) exacerbation: Secondary | ICD-10-CM | POA: Insufficient documentation

## 2013-06-01 DIAGNOSIS — Z8669 Personal history of other diseases of the nervous system and sense organs: Secondary | ICD-10-CM | POA: Insufficient documentation

## 2013-06-01 DIAGNOSIS — Z79899 Other long term (current) drug therapy: Secondary | ICD-10-CM | POA: Insufficient documentation

## 2013-06-01 DIAGNOSIS — R079 Chest pain, unspecified: Secondary | ICD-10-CM | POA: Insufficient documentation

## 2013-06-01 DIAGNOSIS — I10 Essential (primary) hypertension: Secondary | ICD-10-CM | POA: Insufficient documentation

## 2013-06-01 DIAGNOSIS — M7989 Other specified soft tissue disorders: Secondary | ICD-10-CM | POA: Insufficient documentation

## 2013-06-01 DIAGNOSIS — J45909 Unspecified asthma, uncomplicated: Secondary | ICD-10-CM

## 2013-06-01 DIAGNOSIS — IMO0002 Reserved for concepts with insufficient information to code with codable children: Secondary | ICD-10-CM | POA: Insufficient documentation

## 2013-06-01 DIAGNOSIS — Z792 Long term (current) use of antibiotics: Secondary | ICD-10-CM | POA: Insufficient documentation

## 2013-06-01 DIAGNOSIS — K219 Gastro-esophageal reflux disease without esophagitis: Secondary | ICD-10-CM | POA: Insufficient documentation

## 2013-06-01 DIAGNOSIS — Z794 Long term (current) use of insulin: Secondary | ICD-10-CM | POA: Insufficient documentation

## 2013-06-01 DIAGNOSIS — Z9089 Acquired absence of other organs: Secondary | ICD-10-CM | POA: Insufficient documentation

## 2013-06-01 DIAGNOSIS — E119 Type 2 diabetes mellitus without complications: Secondary | ICD-10-CM | POA: Insufficient documentation

## 2013-06-01 DIAGNOSIS — E785 Hyperlipidemia, unspecified: Secondary | ICD-10-CM | POA: Insufficient documentation

## 2013-06-01 DIAGNOSIS — E669 Obesity, unspecified: Secondary | ICD-10-CM | POA: Insufficient documentation

## 2013-06-01 LAB — CBC WITH DIFFERENTIAL/PLATELET
BASOS ABS: 0 10*3/uL (ref 0.0–0.1)
Basophils Relative: 1 % (ref 0–1)
EOS PCT: 2 % (ref 0–5)
Eosinophils Absolute: 0.1 10*3/uL (ref 0.0–0.7)
HEMATOCRIT: 37.4 % (ref 36.0–46.0)
HEMOGLOBIN: 12.9 g/dL (ref 12.0–15.0)
LYMPHS ABS: 2.4 10*3/uL (ref 0.7–4.0)
Lymphocytes Relative: 30 % (ref 12–46)
MCH: 29.7 pg (ref 26.0–34.0)
MCHC: 34.5 g/dL (ref 30.0–36.0)
MCV: 86 fL (ref 78.0–100.0)
MONO ABS: 0.5 10*3/uL (ref 0.1–1.0)
MONOS PCT: 6 % (ref 3–12)
Neutro Abs: 4.9 10*3/uL (ref 1.7–7.7)
Neutrophils Relative %: 62 % (ref 43–77)
Platelets: 317 10*3/uL (ref 150–400)
RBC: 4.35 MIL/uL (ref 3.87–5.11)
RDW: 12.2 % (ref 11.5–15.5)
WBC: 7.9 10*3/uL (ref 4.0–10.5)

## 2013-06-01 LAB — BASIC METABOLIC PANEL
BUN: 15 mg/dL (ref 6–23)
CALCIUM: 9.5 mg/dL (ref 8.4–10.5)
CHLORIDE: 97 meq/L (ref 96–112)
CO2: 25 mEq/L (ref 19–32)
Creatinine, Ser: 0.58 mg/dL (ref 0.50–1.10)
GFR calc non Af Amer: >90 mL/min (ref >90)
Glucose, Bld: 371 mg/dL — ABNORMAL HIGH (ref 70–99)
Potassium: 3.8 mEq/L (ref 3.7–5.3)
SODIUM: 139 meq/L (ref 137–147)

## 2013-06-01 LAB — POCT I-STAT TROPONIN I
TROPONIN I, POC: 0.08 ng/mL (ref 0.00–0.08)
TROPONIN I, POC: 0.02 ng/mL (ref 0.00–0.08)

## 2013-06-01 LAB — D-DIMER, QUANTITATIVE (NOT AT ARMC)

## 2013-06-01 MED ORDER — SODIUM CHLORIDE 0.9 % IV BOLUS (SEPSIS)
1000.0000 mL | Freq: Once | INTRAVENOUS | Status: AC
  Administered 2013-06-01: 1000 mL via INTRAVENOUS

## 2013-06-01 MED ORDER — MORPHINE SULFATE 4 MG/ML IJ SOLN
4.0000 mg | Freq: Once | INTRAMUSCULAR | Status: AC
  Administered 2013-06-01: 4 mg via INTRAVENOUS
  Filled 2013-06-01: qty 1

## 2013-06-01 MED ORDER — METHYLPREDNISOLONE SODIUM SUCC 125 MG IJ SOLR
125.0000 mg | Freq: Once | INTRAMUSCULAR | Status: AC
  Administered 2013-06-01: 125 mg via INTRAVENOUS
  Filled 2013-06-01: qty 2

## 2013-06-01 MED ORDER — ATENOLOL 50 MG PO TABS
50.0000 mg | ORAL_TABLET | Freq: Once | ORAL | Status: AC
  Administered 2013-06-01: 50 mg via ORAL
  Filled 2013-06-01: qty 1

## 2013-06-01 MED ORDER — LISINOPRIL 10 MG PO TABS
5.0000 mg | ORAL_TABLET | Freq: Once | ORAL | Status: AC
  Administered 2013-06-01: 5 mg via ORAL
  Filled 2013-06-01: qty 1

## 2013-06-01 MED ORDER — PREDNISONE 20 MG PO TABS: 40.0000 mg | ORAL_TABLET | Freq: Every day | ORAL | Status: DC

## 2013-06-01 MED ORDER — ALBUTEROL SULFATE HFA 108 (90 BASE) MCG/ACT IN AERS: 2.0000 | INHALATION_SPRAY | RESPIRATORY_TRACT | Status: DC | PRN

## 2013-06-01 MED ORDER — ALBUTEROL SULFATE (2.5 MG/3ML) 0.083% IN NEBU
5.0000 mg | INHALATION_SOLUTION | Freq: Once | RESPIRATORY_TRACT | Status: AC
  Administered 2013-06-01: 5 mg via RESPIRATORY_TRACT
  Filled 2013-06-01: qty 6

## 2013-06-01 MED ORDER — CHLORTHALIDONE 25 MG PO TABS
25.0000 mg | ORAL_TABLET | Freq: Once | ORAL | Status: AC
  Administered 2013-06-01: 25 mg via ORAL
  Filled 2013-06-01: qty 1

## 2013-06-01 MED ORDER — IPRATROPIUM BROMIDE 0.02 % IN SOLN
0.5000 mg | Freq: Once | RESPIRATORY_TRACT | Status: AC
  Administered 2013-06-01: 0.5 mg via RESPIRATORY_TRACT
  Filled 2013-06-01: qty 2.5

## 2013-06-01 MED ORDER — ATENOLOL-CHLORTHALIDONE 50-25 MG PO TABS: 1.0000 | ORAL_TABLET | Freq: Every day | ORAL | Status: DC

## 2013-06-01 NOTE — ED Notes (Signed)
Patient presents to ED with complaints of shortness of breath that started about 15 minutes ago at work.

## 2013-06-01 NOTE — ED Provider Notes (Signed)
CSN: [id number]     Arrival date & time 06/01/13  0744 History   First MD Initiated Contact with Patient 06/01/13 0745     Chief Complaint  Patient presents with  . Asthma     (Consider location/radiation/quality/duration/timing/severity/associated sxs/prior Treatment) HPI Comments: Patient is 48 year old female history of diabetes, asthma, hyperlipidemia, hypertension, obesity who presents today with shortness of breath. She reports that this began approximately 20 minutes ago and feels like her asthma. She feels as though she's been wheezing. The pain is not pleuritic in nature. She has not taken any medications to improve her symptoms. She denies any recent illness, fever, cough, congestion. She has had several recent trips. She reports that she had left leg swelling that has since gone down. This was last week. She denies any other symptoms including nausea, vomiting, abdominal pain.   The history is provided by the patient. No language interpreter was used.    Past Medical History  Diagnosis Date  . Asthma   . Diabetes mellitus   . Hyperlipidemia   . Hypertension   . GERD (gastroesophageal reflux disease)   . Cerebellar tumor   . Obesity   . Sleep apnea    Past Surgical History  Procedure Laterality Date  . Cholecystectomy    . Partial hysterectomy      ovaries remain  . Nasal sinus surgery      Dr. Lyn Hollingshead  . Abdominal hysterectomy     Family History  Problem Relation Age of Onset  . Coronary artery disease Father   . Hypertension Father   . Diabetes      maternal grandparents  . Hyperlipidemia Other    History  Substance Use Topics  . Smoking status: Never Smoker   . Smokeless tobacco: Not on file  . Alcohol Use: No   OB History   Grav Para Term Preterm Abortions TAB SAB Ect Mult Living                 Review of Systems  Constitutional: Negative for fever and chills.  Respiratory: Positive for shortness of breath and wheezing. Negative for cough.    Cardiovascular: Positive for leg swelling. Negative for chest pain.  Gastrointestinal: Negative for nausea, vomiting and abdominal pain.  All other systems reviewed and are negative.      Allergies  Review of patient's allergies indicates no known allergies.  Home Medications   Current Outpatient Rx  Name  Route  Sig  Dispense  Refill  . albuterol (VENTOLIN HFA) 108 (90 BASE) MCG/ACT inhaler   Inhalation   Inhale 2 puffs into the lungs every 4 (four) hours as needed.   1 Inhaler   6   . Alcohol Swabs (ALCOHOL PREP) 70 % PADS   Does not apply   by Does not apply route as directed.           Marland Kitchen amoxicillin-clavulanate (AUGMENTIN) 875-125 MG per tablet   Oral   Take 1 tablet by mouth 2 (two) times daily.   20 tablet   0   . atenolol-chlorthalidone (TENORETIC) 50-25 MG per tablet   Oral   Take 1 tablet by mouth daily.   30 tablet   5   . atorvastatin (LIPITOR) 40 MG tablet      TAKE 1 TABLET BY MOUTH DAILY   30 tablet   5   . beclomethasone (QVAR) 80 MCG/ACT inhaler   Inhalation   Inhale 1 puff into the lungs as needed.   1  Inhaler   12   . glipiZIDE (GLUCOTROL) 10 MG tablet      TAKE 1 TABLET (10 MG TOTAL) BY MOUTH DAILY.   30 tablet   5   . glucose blood (TRUETEST TEST) test strip      Use as directed once daily   90 each   1   . glucose blood test strip   Other   1 each by Other route as needed. Onetouch ultra test strip          . insulin detemir (LEVEMIR FLEXPEN) 100 UNIT/ML injection   Subcutaneous   Inject 80 Units into the skin every morning.         Marland Kitchen ipratropium (ATROVENT) 0.03 % nasal spray   Nasal   Place 2 sprays into the nose 2 (two) times daily.   30 mL   5   . Lancet Devices (B-D LANCET DEVICE) MISC   Does not apply   by Does not apply route as directed.           Marland Kitchen lisinopril (PRINIVIL,ZESTRIL) 5 MG tablet      TAKE 1 TABLET (5 MG TOTAL) BY MOUTH DAILY.   30 tablet   5   . metFORMIN (GLUCOPHAGE) 1000 MG  tablet      1 TABLET TWICE DAILY         . metFORMIN (GLUCOPHAGE) 1000 MG tablet      TAKE 1/2 TABLET BY MOUTH TWICE DAILY FOR 1 WEEK, THEN 1 TABLET TWICE DAILY   60 tablet   5   . mometasone (NASONEX) 50 MCG/ACT nasal spray   Nasal   Place 2 sprays into the nose daily.           Marland Kitchen ofloxacin (OCUFLOX) 0.3 % ophthalmic solution   Left Eye   Place 1 drop into the left eye every 4 (four) hours.   5 mL   0   . Omega-3 Fatty Acids (FISH OIL) 1000 MG CAPS   Oral   Take by mouth 3 (three) times daily.           . pantoprazole (PROTONIX) 40 MG tablet   Oral   Take 1 tablet (40 mg total) by mouth daily.   30 tablet   5   . potassium chloride SA (K-DUR,KLOR-CON) 20 MEQ tablet   Oral   Take 20 mEq by mouth every other day. Take one tablet by mouth daily.          BP 155/69  Pulse 85  Temp(Src) 98.1 F (36.7 C) (Oral)  Resp 14  Ht 5\' 1"  (1.549 m)  Wt 195 lb (88.451 kg)  BMI 36.86 kg/m2  SpO2 98% Physical Exam  Nursing note and vitals reviewed. Constitutional: She is oriented to person, place, and time. She appears well-developed and well-nourished. She appears distressed.  HENT:  Head: Normocephalic and atraumatic.  Right Ear: External ear normal.  Left Ear: External ear normal.  Nose: Nose normal.  Mouth/Throat: Oropharynx is clear and moist.  Eyes: Conjunctivae and EOM are normal. Pupils are equal, round, and reactive to light.  Neck: Normal range of motion.  Cardiovascular: Normal rate, regular rhythm and normal heart sounds.   Pulmonary/Chest: No stridor. Tachypnea noted. No respiratory distress. She has wheezes (faint). She has no rales.  Abdominal: Soft. She exhibits no distension.  Musculoskeletal: Normal range of motion.  Neurological: She is alert and oriented to person, place, and time. She has normal strength. Coordination and gait normal.  Grip strength 5/5 bilaterally  Skin: Skin is warm and dry. She is not diaphoretic. No erythema.  Psychiatric:  She has a normal mood and affect. Her behavior is normal.    ED Course  Procedures (including critical care time) Labs Review Labs Reviewed  BASIC METABOLIC PANEL - Abnormal; Notable for the following:    Glucose, Bld 371 (*)    All other components within normal limits  CBC WITH DIFFERENTIAL  D-DIMER, QUANTITATIVE  POCT I-STAT TROPONIN I  POCT I-STAT TROPONIN I   Imaging Review Dg Chest 2 View  06/01/2013   CLINICAL DATA:  Chest pain, difficulty breathing, unable to walk this morning, history asthma, diabetes, hypertension  EXAM: CHEST  2 VIEW  COMPARISON:  05/21/2008  FINDINGS: Enlargement of cardiac silhouette.  Mediastinal contours and pulmonary vascularity normal.  Slight accentuation of interstitial markings since the previous exam particularly in mid right lung, difficult to exclude subtle infiltrate.  Remaining lungs clear.  No pleural effusion or pneumothorax.  No acute osseous findings.  IMPRESSION: Enlargement of cardiac silhouette.  Slight accentuation of markings particularly in the right mid lung, cannot exclude subtle infiltrate.   Electronically Signed   By: Lavonia Dana M.D.   On: 06/01/2013 11:46    EKG Interpretation   None      11:01 AM Patient reports she has developed chest pain and pressure. Will check troponin and recheck EKG. Likely delta trop and home.   MDM   Final diagnoses:  Asthma  Chest pain   Patient ambulated in ED with O2 saturations maintained >90, no current signs of respiratory distress. Lung exam improved after nebulizer treatment. Patient feels significantly improved and is not longer short of breath. D dimer negative. No concern for PE. Solumedrol given in the ED and pt will bd dc with 5 day burst of prednisone. Pt states they are breathing at baseline. Pt has been instructed to continue using prescribed medications and to speak with PCP about today's exacerbation. Pt was about to be discharged and began complaining of chest pain. Chest pain and  pressure. It was mild and only lasted a few minutes. Not associated with shortness of breath or diaphoresis. Atypical CP. Troponin negative x 2. Patient stable for d/c home. Strict return instructions given. Discussed case with Dr. Curly Rim who agrees with plan. Patient / Family / Caregiver informed of clinical course, understand medical decision-making process, and agree with plan.   Filed Vitals:   06/01/13 1045  BP: 155/69  Pulse: 85  Temp:   Resp: [address] [place] 06/01/13 1553

## 2013-06-01 NOTE — ED Provider Notes (Signed)
Medical screening examination/treatment/procedure(s) were performed by non-physician practitioner and as supervising physician I was immediately available for consultation/collaboration.  EKG Interpretation   None         Elmer Sow, MD 06/01/13 [phone]

## 2013-06-01 NOTE — ED Notes (Signed)
Ambulated pt. To the bathroom 

## 2013-06-01 NOTE — ED Notes (Signed)
Patient reports intermittent chest pressure. PA [facility] notified. Troponin 12 lead and chest xray ordered.

## 2013-06-01 NOTE — Discharge Instructions (Signed)
Asthma, Adult Asthma is a recurring condition in which the airways tighten and narrow. Asthma can make it difficult to breathe. It can cause coughing, wheezing, and shortness of breath. Asthma episodes (also called asthma attacks) range from minor to life-threatening. Asthma cannot be cured, but medicines and lifestyle changes can help control it. CAUSES Asthma is believed to be caused by inherited (genetic) and environmental factors, but its exact cause is unknown. Asthma may be triggered by allergens, lung infections, or irritants in the air. Asthma triggers are different for each person. Common triggers include:   Animal dander.  Dust mites.  Cockroaches.  Pollen from trees or grass.  Mold.  Smoke.  Air pollutants such as dust, household cleaners, hair sprays, aerosol sprays, paint fumes, strong chemicals, or strong odors.  Cold air, weather changes, and winds (which increase molds and pollens in the air).  Strong emotional expressions such as crying or laughing hard.  Stress.  Certain medicines (such as aspirin) or types of drugs (such as beta-blockers).  Sulfites in foods and drinks. Foods and drinks that may contain sulfites include dried fruit, potato chips, and sparkling grape juice.  Infections or inflammatory conditions such as the flu, a cold, or an inflammation of the nasal membranes (rhinitis).  Gastroesophageal reflux disease (GERD).  Exercise or strenuous activity. SYMPTOMS Symptoms may occur immediately after asthma is triggered or many hours later. Symptoms include:  Wheezing.  Excessive nighttime or early morning coughing.  Frequent or severe coughing with a common cold.  Chest tightness.  Shortness of breath. DIAGNOSIS  The diagnosis of asthma is made by a review of your medical history and a physical exam. Tests may also be performed. These may include:  Lung function studies. These tests show how much air you breath in and out.  Allergy  tests.  Imaging tests such as X-rays. TREATMENT  Asthma cannot be cured, but it can usually be controlled. Treatment involves identifying and avoiding your asthma triggers. It also involves medicines. There are 2 classes of medicine used for asthma treatment:   Controller medicines. These prevent asthma symptoms from occurring. They are usually taken every day.  Reliever or rescue medicines. These quickly relieve asthma symptoms. They are used as needed and provide short-term relief. Your health care provider will help you create an asthma action plan. An asthma action plan is a written plan for managing and treating your asthma attacks. It includes a list of your asthma triggers and how they may be avoided. It also includes information on when medicines should be taken and when their dosage should be changed. An action plan may also involve the use of a device called a peak flow meter. A peak flow meter measures how well the lungs are working. It helps you monitor your condition. HOME CARE INSTRUCTIONS   Take medicine as directed by your health care provider. Speak with your health care provider if you have questions about how or when to take the medicines.  Use a peak flow meter as directed by your health care provider. Record and keep track of readings.  Understand and use the action plan to help minimize or stop an asthma attack without needing to seek medical care.  Control your home environment in the following ways to help prevent asthma attacks:  Do not smoke. Avoid being exposed to secondhand smoke.  Change your heating and air conditioning filter regularly.  Limit your use of fireplaces and wood stoves.  Get rid of pests (such as roaches and  mice) and their droppings.  Throw away plants if you see mold on them.  Clean your floors and dust regularly. Use unscented cleaning products.  Try to have someone else vacuum for you regularly. Stay out of rooms while they are being  vacuumed and for a short while afterward. If you vacuum, use a dust mask from a hardware store, a double-layered or microfilter vacuum cleaner bag, or a vacuum cleaner with a HEPA filter.  Replace carpet with wood, tile, or vinyl flooring. Carpet can trap dander and dust.  Use allergy-proof pillows, mattress covers, and box spring covers.  Wash bed sheets and blankets every week in hot water and dry them in a dryer.  Use blankets that are made of polyester or cotton.  Clean bathrooms and kitchens with bleach. If possible, have someone repaint the walls in these rooms with mold-resistant paint. Keep out of the rooms that are being cleaned and painted.  Wash hands frequently. SEEK MEDICAL CARE IF:   You have wheezing, shortness of breath, or a cough even if taking medicine to prevent attacks.  The colored mucus you cough up (sputum) is thicker than usual.  Your sputum changes from clear or white to yellow, green, gray, or bloody.  You have any problems that may be related to the medicines you are taking (such as a rash, itching, swelling, or trouble breathing).  You are using a reliever medicine more than 2 3 times per week.  Your peak flow is still at 50 79% of you personal best after following your action plan for 1 hour. SEEK IMMEDIATE MEDICAL CARE IF:   You seem to be getting worse and are unresponsive to treatment during an asthma attack.  You are short of breath even at rest.  You get short of breath when doing very little physical activity.  You have difficulty eating, drinking, or talking due to asthma symptoms.  You develop chest pain.  You develop a fast heartbeat.  You have a bluish color to your lips or fingernails.  You are lightheaded, dizzy, or faint.  Your peak flow is less than 50% of your personal best.  You have a fever or persistent symptoms for more than 2 3 days.  You have a fever and symptoms suddenly get worse. MAKE SURE YOU:   Understand these  instructions.  Will watch your condition.  Will get help right away if you are not doing well or get worse. Document Released: 04/06/2005 Document Revised: 12/07/2012 Document Reviewed: 11/03/2012 [facility] Patient Information 2014 [place], [place].  Chest Pain Observation It is often hard to give a specific diagnosis for the cause of chest pain. Among other possibilities your symptoms might be caused by inadequate oxygen delivery to your heart (angina). Angina that is not treated or evaluated can lead to a heart attack (myocardial infarction) or death. Blood tests, electrocardiograms, and X-rays may have been done to help determine a possible cause of your chest pain. After evaluation and observation, your health care provider has determined that it is unlikely your pain was caused by an unstable condition that requires hospitalization. However, a full evaluation of your pain may need to be completed, with additional diagnostic testing as directed. It is very important to keep your follow-up appointments. Not keeping your follow-up appointments could result in permanent heart damage, disability, or death. If there is any problem keeping your follow-up appointments, you must call your health care provider. HOME CARE INSTRUCTIONS  Due to the slight chance that your pain  could be angina, it is important to follow your health care provider's treatment plan and also maintain a healthy lifestyle:  Maintain or work toward achieving a healthy weight.  Stay physically active and exercise regularly.  Decrease your salt intake.  Eat a balanced, healthy diet. Talk to a dietician to learn about heart healthy foods.  Increase your fiber intake by including whole grains, vegetables, fruits, and nuts in your diet.  Avoid situations that cause stress, anger, or depression.  Take medicines as advised by your health care provider. Report any side effects to your health care provider. Do not stop medicines or  adjust the dosages on your own.  Quit smoking. Do not use nicotine patches or gum until you check with your health care provider.  Keep your blood pressure, blood sugar, and cholesterol levels within normal limits.  Limit alcohol intake to no more than 1 drink per day for women that are not pregnant and 2 drinks per day for men.  Do not abuse drugs. SEEK IMMEDIATE MEDICAL CARE IF: You have severe chest pain or pressure which may include symptoms such as:  You feel pain or pressure in you arms, neck, jaw, or back.  You have severe back or abdominal pain, feel sick to your stomach (nauseous), or throw up (vomit).  You are sweating profusely.  You are having a fast or irregular heartbeat.  You feel short of breath while at rest.  You notice increasing shortness of breath during rest, sleep, or with activity.  You have chest pain that does not get better after rest or after taking your usual medicine.  You wake from sleep with chest pain.  You are unable to sleep because you cannot breathe.  You develop a frequent cough or you are coughing up blood.  You feel dizzy, faint, or experience extreme fatigue.  You develop severe weakness, dizziness, fainting, or chills. Any of these symptoms may represent a serious problem that is an emergency. Do not wait to see if the symptoms will go away. Call your local emergency services (911 in the [place] Do not drive yourself to the hospital. MAKE SURE YOU:  Understand these instructions.  Will watch your condition.  Will get help right away if you are not doing well or get worse. Document Released: 05/09/2010 Document Revised: 12/07/2012 Document Reviewed: 10/06/2012 [facility] Patient Information 2014 [place], [place].

## 2013-06-29 ENCOUNTER — Ambulatory Visit (INDEPENDENT_AMBULATORY_CARE_PROVIDER_SITE_OTHER): Payer: 59 | Admitting: Physician Assistant

## 2013-06-29 VITALS — BP 124/76 | HR 63 | Temp 99.2°F | Resp 16 | Ht 63.0 in | Wt 183.0 lb

## 2013-06-29 DIAGNOSIS — J329 Chronic sinusitis, unspecified: Secondary | ICD-10-CM

## 2013-06-29 DIAGNOSIS — J309 Allergic rhinitis, unspecified: Secondary | ICD-10-CM

## 2013-06-29 DIAGNOSIS — H1010 Acute atopic conjunctivitis, unspecified eye: Secondary | ICD-10-CM

## 2013-06-29 MED ORDER — AZELASTINE HCL 0.05 % OP SOLN: 1.0000 [drp] | Freq: Two times a day (BID) | OPHTHALMIC | Status: DC

## 2013-06-29 MED ORDER — AMOXICILLIN-POT CLAVULANATE 875-125 MG PO TABS: 1.0000 | ORAL_TABLET | Freq: Two times a day (BID) | ORAL | Status: DC

## 2013-06-29 MED ORDER — CETIRIZINE HCL 10 MG PO TABS: 10.0000 mg | ORAL_TABLET | Freq: Every day | ORAL | Status: DC

## 2013-06-29 NOTE — Progress Notes (Signed)
Subjective:    Patient ID: Crystal Peck, female    DOB: July 27, 1965, 48 y.o.   MRN: [id number]  HPI   Crystal Peck is a very pleasant 48 yr old female here with concern for illness.  She reports she's been having problems with her eyes for about a week.  Not sure if this is due to "sinus" or working on the computer all day.  Both eyes are burning, some watery discharge.  She denies FB sensation.  She denies eye pain, visual disturbance.  No purulent drainage.  She has also been experiencing some post-nasal drainage.  Previous allergy problems.  Prescribed Nasonex, but using regularly.  Long history of sinus problems.  Previous sinus surgery.  Has had a HA for the last two days.  No nasal drainage.  Some right side ear pain as well.  No fever but feels hot and cold.  She did feel nauseous this morning and had one episode of emesis.  She attributes this to post-nasal drainage.  She denies abdominal pain or diarrhea.    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  She reports this is followed closely and that she is doing well.   Review of Systems  Constitutional: Negative for fever and chills.  HENT: Positive for congestion, ear pain, postnasal drip and sinus pressure.   Eyes: Positive for discharge (watery) and redness. Negative for photophobia, pain, itching and visual disturbance.  Respiratory: Negative for cough, shortness of breath and wheezing.   Cardiovascular: Negative.   Gastrointestinal: Negative.   Musculoskeletal: Negative.   Skin: Negative.        Objective:   Physical Exam  Vitals reviewed. Constitutional: She is oriented to person, place, and time. She appears well-developed and well-nourished. No distress.  HENT:  Head: Normocephalic and atraumatic.  Right Ear: Tympanic membrane and ear canal normal.  Left Ear: Tympanic membrane and ear canal normal.  Nose: Right sinus exhibits maxillary sinus tenderness and frontal sinus tenderness. Left sinus exhibits no maxillary  sinus tenderness and no frontal sinus tenderness.  Mouth/Throat: Uvula is midline, oropharynx is clear and moist and mucous membranes are normal.  Eyes: EOM and lids are normal. Pupils are equal, round, and reactive to light. Right conjunctiva is not injected. Left conjunctiva is not injected.  Bilateral pinguecula; conjunctiva are not injected; there is no discharge from the eyes  Neck: Neck supple.  Cardiovascular: Normal rate, regular rhythm and normal heart sounds.   Pulmonary/Chest: Effort normal. She has no wheezes. She has no rales.  Lymphadenopathy:    She has no cervical adenopathy.  Neurological: She is alert and oriented to person, place, and time.  Skin: Skin is warm and dry.  Psychiatric: She has a normal mood and affect. Her behavior is normal.        Assessment & Plan:  Allergic conjunctivitis and rhinitis - Plan: azelastine (OPTIVAR) 0.05 % ophthalmic solution, cetirizine (ZYRTEC) 10 MG tablet  Sinusitis - Plan: amoxicillin-clavulanate (AUGMENTIN) 875-125 MG per tablet, cetirizine (ZYRTEC) 10 MG tablet   Crystal Peck is a very pleasant 48 yr old female here with allergic conjunctivitis and rhinitis.  Eye exam is normal today.  Pt with known underling allergies.  Will treat with Optivar drops.  Will also start Zyrtec.  Encouraged pt to use Nasonex daily to help control allergy symptoms and hopefully prevent development of sinus infections.  Today she does have significant tenderness over the Right frontal and Right maxillary sinuses - given this will treat with  abx for infection.  Work note provided per pt request.  Pt to call or RTC if worsening or not improving  E. Natividad Brood MHS, PA-C [facility] 3/12/20155:01 PM

## 2013-06-29 NOTE — Patient Instructions (Signed)
The antibiotic prescribed today is for your present infection only. It is very important to follow the directions for the medication prescribed. Antibiotics are generally given for a specified period of time (7-10 days, for example) to be taken at specific intervals (every 4, 6, 8 or 12 hours). This is necessary to keep the right amount of the medication in the bloodstream. Too much of the medication may cause an adverse reaction, too little may not be completely effective.  To clear your infection completely, continue taking the antibiotic for the full time of treatment, even if you begin to feel better after a few days.  If you miss a dose of the antibiotic, take it as soon as possible. Then go back to your regular dosing schedule. However, don't double up doses.    Take the augmentin (antibiotic) as directed.  Be sure to finish the full course  Make sure you are using the Nasonex EVERY DAY - this will help with allergy symptoms and help prevent sinus infections  Take the cetirizine (allergy medicine) every day - this will also help you feel better  If anything is worsening or not improving, please let us know   Allergic Rhinitis Allergic rhinitis is when the mucous membranes in the nose respond to allergens. Allergens are particles in the air that cause your body to have an allergic reaction. This causes you to release allergic antibodies. Through a chain of events, these eventually cause you to release histamine into the blood stream. Although meant to protect the body, it is this release of histamine that causes your discomfort, such as frequent sneezing, congestion, and an itchy, runny nose.  CAUSES  Seasonal allergic rhinitis (hay fever) is caused by pollen allergens that may come from grasses, trees, and weeds. Year-round allergic rhinitis (perennial allergic rhinitis) is caused by allergens such as house dust mites, pet dander, and mold spores.  SYMPTOMS   Nasal stuffiness  (congestion).  Itchy, runny nose with sneezing and tearing of the eyes. DIAGNOSIS  Your health care provider can help you determine the allergen or allergens that trigger your symptoms. If you and your health care provider are unable to determine the allergen, skin or blood testing may be used. TREATMENT  Allergic Rhinitis does not have a cure, but it can be controlled by:  Medicines and allergy shots (immunotherapy).  Avoiding the allergen. Hay fever may often be treated with antihistamines in pill or nasal spray forms. Antihistamines block the effects of histamine. There are over-the-counter medicines that may help with nasal congestion and swelling around the eyes. Check with your health care provider before taking or giving this medicine.  If avoiding the allergen or the medicine prescribed do not work, there are many new medicines your health care provider can prescribe. Stronger medicine may be used if initial measures are ineffective. Desensitizing injections can be used if medicine and avoidance does not work. Desensitization is when a patient is given ongoing shots until the body becomes less sensitive to the allergen. Make sure you follow up with your health care provider if problems continue. HOME CARE INSTRUCTIONS It is not possible to completely avoid allergens, but you can reduce your symptoms by taking steps to limit your exposure to them. It helps to know exactly what you are allergic to so that you can avoid your specific triggers. SEEK MEDICAL CARE IF:   You have a fever.  You develop a cough that does not stop easily (persistent).  You have shortness of breath.  You start wheezing.  Symptoms interfere with normal daily activities. Document Released: 12/30/2000 Document Revised: 01/25/2013 Document Reviewed: 12/12/2012 [facility] Patient Information 2014 [place].

## 2013-08-25 ENCOUNTER — Encounter: Payer: Self-pay | Admitting: Family Medicine

## 2013-08-25 ENCOUNTER — Ambulatory Visit (INDEPENDENT_AMBULATORY_CARE_PROVIDER_SITE_OTHER): Payer: 59 | Admitting: Family Medicine

## 2013-08-25 VITALS — BP 126/82 | HR 103 | Temp 98.4°F | Resp 16 | Wt 181.4 lb

## 2013-08-25 DIAGNOSIS — E1165 Type 2 diabetes mellitus with hyperglycemia: Secondary | ICD-10-CM

## 2013-08-25 DIAGNOSIS — IMO0001 Reserved for inherently not codable concepts without codable children: Secondary | ICD-10-CM

## 2013-08-25 DIAGNOSIS — I10 Essential (primary) hypertension: Secondary | ICD-10-CM

## 2013-08-25 DIAGNOSIS — G4733 Obstructive sleep apnea (adult) (pediatric): Secondary | ICD-10-CM | POA: Insufficient documentation

## 2013-08-25 DIAGNOSIS — E785 Hyperlipidemia, unspecified: Secondary | ICD-10-CM

## 2013-08-25 LAB — LIPID PANEL
CHOL/HDL RATIO: 5
Cholesterol: 223 mg/dL — ABNORMAL HIGH (ref 0–200)
HDL: 44.9 mg/dL (ref >39.00)
LDL Cholesterol: 160 mg/dL — ABNORMAL HIGH (ref 0–99)
Triglycerides: 91 mg/dL (ref 0.0–149.0)
VLDL: 18.2 mg/dL (ref 0.0–40.0)

## 2013-08-25 LAB — CBC WITH DIFFERENTIAL/PLATELET
BASOS PCT: 0.6 % (ref 0.0–3.0)
Basophils Absolute: 0 10*3/uL (ref 0.0–0.1)
Eosinophils Absolute: 0.1 10*3/uL (ref 0.0–0.7)
Eosinophils Relative: 1.2 % (ref 0.0–5.0)
HCT: 35.6 % — ABNORMAL LOW (ref 36.0–46.0)
Hemoglobin: 11.7 g/dL — ABNORMAL LOW (ref 12.0–15.0)
Lymphocytes Relative: 31.9 % (ref 12.0–46.0)
Lymphs Abs: 1.6 10*3/uL (ref 0.7–4.0)
MCHC: 32.9 g/dL (ref 30.0–36.0)
MCV: 87.6 fl (ref 78.0–100.0)
MONO ABS: 0.4 10*3/uL (ref 0.1–1.0)
Monocytes Relative: 7.8 % (ref 3.0–12.0)
NEUTROS PCT: 58.5 % (ref 43.0–77.0)
Neutro Abs: 3 10*3/uL (ref 1.4–7.7)
Platelets: 314 10*3/uL (ref 150.0–400.0)
RBC: 4.06 Mil/uL (ref 3.87–5.11)
RDW: 13.1 % (ref 11.5–15.5)
WBC: 5.2 10*3/uL (ref 4.0–10.5)

## 2013-08-25 LAB — BASIC METABOLIC PANEL
BUN: 13 mg/dL (ref 6–23)
CO2: 26 mEq/L (ref 19–32)
Calcium: 9.2 mg/dL (ref 8.4–10.5)
Chloride: 103 mEq/L (ref 96–112)
Creatinine, Ser: 0.5 mg/dL (ref 0.4–1.2)
GFR: 157.75 mL/min (ref >60.00)
GLUCOSE: 253 mg/dL — AB (ref 70–99)
POTASSIUM: 3.5 meq/L (ref 3.5–5.1)
SODIUM: 139 meq/L (ref 135–145)

## 2013-08-25 LAB — HEPATIC FUNCTION PANEL
ALT: 23 U/L (ref 0–35)
AST: 17 U/L (ref 0–37)
Albumin: 3.6 g/dL (ref 3.5–5.2)
Alkaline Phosphatase: 72 U/L (ref 39–117)
BILIRUBIN DIRECT: 0.1 mg/dL (ref 0.0–0.3)
BILIRUBIN TOTAL: 1 mg/dL (ref 0.2–1.2)
Total Protein: 7 g/dL (ref 6.0–8.3)

## 2013-08-25 LAB — HEMOGLOBIN A1C: Hgb A1c MFr Bld: 12.5 % — ABNORMAL HIGH (ref 4.6–6.5)

## 2013-08-25 LAB — TSH: TSH: 0.1 u[IU]/mL — ABNORMAL LOW (ref 0.35–4.50)

## 2013-08-25 NOTE — Patient Instructions (Signed)
Schedule your complete physical in 6 months We'll call you with your pulmonary appt to restart the CPAP We'll fax your labs to Endo at [place] to work on healthy diet and regular exercise Call with any questions or concerns Happy Mother's Day!!!

## 2013-08-25 NOTE — Progress Notes (Signed)
   Subjective:    Patient ID: Crystal Peck, female    DOB: Jan 31, 1966, 48 y.o.   MRN: [id number]  HPI HTN- chronic problem, well controlled on Lisinopril, Atenolol-Chlorthalidone.  No CP, SOB above asthma baseline, HAs above baseline,   Hyperlipidemia- chronic problem, on Lipitor.  Denies abd pain, N/V, myalgias.  DM- chronic problem, following w/ Endo ([facility]).  On ACE for renal protection.  Overdue on appt.  OSA- chronic problem, stopped using CPAP over a year ago.  Not currently seeing pulmonary.      Review of Systems For ROS see HPI     Objective:   Physical Exam  Vitals reviewed. Constitutional: She is oriented to person, place, and time. She appears well-developed and well-nourished. No distress.  HENT:  Head: Normocephalic and atraumatic.  Eyes: Conjunctivae and EOM are normal. Pupils are equal, round, and reactive to light.  Neck: Normal range of motion. Neck supple. No thyromegaly present.  Cardiovascular: Normal rate, regular rhythm, normal heart sounds and intact distal pulses.   No murmur heard. Pulmonary/Chest: Effort normal and breath sounds normal. No respiratory distress.  Abdominal: Soft. She exhibits no distension. There is no tenderness.  Musculoskeletal: She exhibits no edema.  Lymphadenopathy:    She has no cervical adenopathy.  Neurological: She is alert and oriented to person, place, and time.  Skin: Skin is warm and dry.  Psychiatric: She has a normal mood and affect. Her behavior is normal.          Assessment & Plan:

## 2013-08-25 NOTE — Assessment & Plan Note (Signed)
Chronic problem.  BP well controlled.  Asymptomatic.  Check labs.  No anticipated med changes. 

## 2013-08-25 NOTE — Assessment & Plan Note (Signed)
Chronic problem.  Tolerating statin w/o difficulty.  Check labs.  Adjust meds prn  

## 2013-08-25 NOTE — Assessment & Plan Note (Signed)
Chronic problem.  Pt had not been following w/ [facility] as directed.  Check labs today and fax them to [facility] for ongoing management.  Stressed need for routine follow up as previously discussed.  Pt expressed understanding.

## 2013-08-25 NOTE — Assessment & Plan Note (Signed)
New to provider.  Pt reports that she used to use a CPAP machine nightly but stopped using it and never told me about it.  Will refer back to pulmonary for complete evaluation and to restart CPAP as needed

## 2013-08-25 NOTE — Progress Notes (Signed)
Pre visit review using our clinic review tool, if applicable. No additional management support is needed unless otherwise documented below in the visit note. 

## 2013-08-26 ENCOUNTER — Telehealth: Payer: Self-pay | Admitting: Family Medicine

## 2013-08-26 NOTE — Telephone Encounter (Signed)
Relevant patient education mailed to patient.  

## 2013-08-30 ENCOUNTER — Ambulatory Visit (INDEPENDENT_AMBULATORY_CARE_PROVIDER_SITE_OTHER): Payer: 59 | Admitting: Pulmonary Disease

## 2013-08-30 ENCOUNTER — Encounter: Payer: Self-pay | Admitting: Pulmonary Disease

## 2013-08-30 ENCOUNTER — Telehealth: Payer: Self-pay | Admitting: Pulmonary Disease

## 2013-08-30 VITALS — BP 140/90 | HR 100 | Temp 97.8°F | Ht 61.5 in | Wt 181.6 lb

## 2013-08-30 DIAGNOSIS — G4733 Obstructive sleep apnea (adult) (pediatric): Secondary | ICD-10-CM

## 2013-08-30 NOTE — Telephone Encounter (Signed)
Corene Cornea from [facility] returning call to Triage.

## 2013-08-30 NOTE — Progress Notes (Signed)
Subjective:    Patient ID: Crystal Peck, female    DOB: Mar 06, 1966, 48 y.o.   MRN: [id number]  HPI The patient is a 48 year old female who I've been asked to see for management of obstructive sleep apnea. She was diagnosed in 2006 with very severe OSA, with an AHI of 137 events per hour and oxygen desaturation as low as 64%. She was treated with CPAP, and wore this for a period of time, and never felt comfortable with her device. She had issues with mask discomfort as well as pressure and tolerance. Since that time, she has lost over 50 pounds, but still is having some symptoms. She has been noted to have snoring as well as an abnormal breathing pattern during sleep by her family. She also notes occasional choking episodes at night, but blames this on postnasal drip. She is not rested in the mornings upon arising, and notes definite sleep pressure during the day with inactivity. She will fall asleep at night watching television or movies, and has occasional sleepiness driving longer distances. Of note, her weight is down 20 pounds over the last 2 years, and 50 since 2006 as stated above. Her Epworth score is 6.   Sleep Questionnaire What time do you typically go to bed?( Between what hours) 10pm 10pm at 1009 on 08/30/13 by Lilli Few, CMA How long does it take you to fall asleep? 5 minutes 5 minutes at 1009 on 08/30/13 by Lilli Few, CMA How many times during the night do you wake up? 2 2 at 1009 on 08/30/13 by Lilli Few, CMA What time do you get out of bed to start your day? 0400 0400 at 1009 on 08/30/13 by Lilli Few, CMA Do you drive or operate heavy machinery in your occupation? No No at 1009 on 08/30/13 by Lilli Few, CMA How much has your weight changed (up or down) over the past two years? (In pounds) 20 lb (9.072 kg) 20 lb (9.072 kg) at 1009 on 08/30/13 by Lilli Few, CMA Have you ever had a sleep study before? Yes Yes at  1009 on 08/30/13 by Lilli Few, CMA If yes, location of study? [place] at 1009 on 08/30/13 by Lilli Few, CMA If yes, date of study? 2004 2004 at 1009 on 08/30/13 by Lilli Few, CMA Do you currently use CPAP? No No at 1009 on 08/30/13 by Lilli Few, CMA Do you wear oxygen at any time? No    Review of Systems  Constitutional: Negative for fever and unexpected weight change.  HENT: Positive for congestion. Negative for dental problem, ear pain, nosebleeds, postnasal drip, rhinorrhea, sinus pressure, sneezing, sore throat and trouble swallowing.   Eyes: Negative for redness and itching.  Respiratory: Positive for shortness of breath. Negative for cough, chest tightness, wheezing and stridor.   Cardiovascular: Negative for palpitations and leg swelling.  Gastrointestinal: Negative for nausea and vomiting.  Genitourinary: Negative for dysuria.  Musculoskeletal: Negative for joint swelling.  Skin: Negative for rash.  Neurological: Negative for headaches.  Hematological: Does not bruise/bleed easily.  Psychiatric/Behavioral: Negative for dysphoric mood. The patient is not nervous/anxious.        Objective:   Physical Exam Constitutional:  Overweight female, no acute distress  HENT:  Nares patent without discharge  Oropharynx without exudate, palate and uvula are mildly elongated.  Eyes:  Perrla, eomi, no scleral icterus  Neck:  No JVD, no TMG  Cardiovascular:  Normal rate, regular rhythm, no rubs or gallops.  No murmurs        Intact distal pulses  Pulmonary :  Normal breath sounds, no stridor or respiratory distress   No rales, rhonchi, or wheezing  Abdominal:  Soft, nondistended, bowel sounds present.  No tenderness noted.   Musculoskeletal:  Minimal lower extremity edema noted.  Lymph Nodes:  No cervical lymphadenopathy noted  Skin:  No cyanosis noted  Neurologic:  Alert, appropriate, moves all 4 extremities  without obvious deficit.         Assessment & Plan:

## 2013-08-30 NOTE — Patient Instructions (Signed)
Will start on cpap with the automatic setting as we discussed. Keep working on weight loss.  You are doing great.  followup with me again in 8 weeks.

## 2013-08-30 NOTE — Telephone Encounter (Signed)
lmomtcb x1 for [employer]

## 2013-08-30 NOTE — Telephone Encounter (Signed)
Spoke with [facility] States that sleep study was done in 2006--there was a break in therapy where CPAP was not used for a long period of time. Insurance will not cover without repeat study d/t lapse in therapy Corene Cornea states that he has checked with CPAP specialist at [facility] and they verified this as well.   Please advise Dr Gwenette Greet. Thanks.

## 2013-08-30 NOTE — Telephone Encounter (Signed)
lmomtcb x1 for [employer]

## 2013-08-30 NOTE — Telephone Encounter (Signed)
[facility] returning call.

## 2013-08-30 NOTE — Assessment & Plan Note (Signed)
The patient has a history of very severe obstructive sleep apnea dating back to 2006, but has lost over 50 pounds since that time. Despite this, she continues to have classic symptoms for persistent sleep-disordered breathing, including inappropriate daytime sleepiness. I have given her the option to have a home sleep test to verify that she still has sleep apnea, but I do not think this is necessary. Her history is classic for persistent sleep-disordered breathing. She has decided to get back on CPAP while she continues to work on weight loss.

## 2013-08-31 NOTE — Telephone Encounter (Signed)
Please send to [facility] to call insurance and verify this is correct.  Medicare definitely does this, but only a few insurance companies do this.

## 2013-09-01 NOTE — Telephone Encounter (Signed)
Spoke to [place], [facility]. She is in the process of verifying this with the insurance company.

## 2013-09-05 NOTE — Telephone Encounter (Signed)
i spoke to [email or web address]  and they do not require a new npsg for restart on cpap notified [facility] Joellen [place]

## 2013-09-20 ENCOUNTER — Telehealth: Payer: Self-pay | Admitting: Pulmonary Disease

## 2013-09-20 NOTE — Telephone Encounter (Signed)
See note 5/19.  Crystal Peck [employer] who said they DO NOT require another sleep study.  Let pt know this.  If advanced refuses to help pt, then she needs to be referred to a different dme.

## 2013-09-20 NOTE — Telephone Encounter (Signed)
Pt aware of information below. Aware to contact our office if anything further needed from DME.  Nothing further needed.

## 2013-09-20 NOTE — Telephone Encounter (Signed)
[place]- please advise if okay to order another sleep study thanks

## 2013-09-21 LAB — HM DIABETES FOOT EXAM: HM Diabetic Foot Exam: NORMAL

## 2013-09-21 LAB — HEMOGLOBIN A1C: Hgb A1c MFr Bld: 11.3 % — AB (ref 4.0–6.0)

## 2013-09-25 ENCOUNTER — Telehealth: Payer: Self-pay

## 2013-09-25 NOTE — Telephone Encounter (Signed)
Relevant patient education mailed to patient.  

## 2013-09-28 ENCOUNTER — Encounter: Payer: Self-pay | Admitting: General Practice

## 2013-10-24 ENCOUNTER — Telehealth: Payer: Self-pay | Admitting: *Deleted

## 2013-10-24 NOTE — Telephone Encounter (Signed)
Pt is scheduled for an appt tomorrow for 8 week follow-up after cpap start but according to DME pt was never set-up. I have called the pt to cancel the appt and speak to her about the issues the DME has been having. [place] Bing, CMA

## 2013-10-25 ENCOUNTER — Ambulatory Visit: Payer: 59 | Admitting: Pulmonary Disease

## 2013-10-25 ENCOUNTER — Telehealth: Payer: Self-pay | Admitting: Pulmonary Disease

## 2013-10-25 DIAGNOSIS — G4733 Obstructive sleep apnea (adult) (pediatric): Secondary | ICD-10-CM

## 2013-10-25 NOTE — Telephone Encounter (Signed)
Called spoke w/ carol from [facility]. She reports she called [facility] and they told her since [place] notes states pt was non compliant she will need a new sleep study. Please advise [place] thanks

## 2013-10-25 NOTE — Telephone Encounter (Signed)
I spoke with the pt and advised that I spoke with Arbie Cookey from [place] yesterday and they are going to re-submit the order for the pt cpap since insurance does not require a new sleep study. She states she will work on this and advise the pt. Pt is aware of this. Appt has been cancelled for today. Pt aware to call us if she doe snot hear from [place].  [place] Bing, CMA

## 2013-10-25 NOTE — Telephone Encounter (Signed)
Put order in the change [place]

## 2013-10-25 NOTE — Telephone Encounter (Signed)
Would switch to different dme.  This is a medicare requirement, but to my knowledge is not a [employer] requirement.  Try another dme that may be more helpful.  Let pt know we are working on this so she doesn't have to have new sleep study.

## 2013-10-25 NOTE — Telephone Encounter (Signed)
Called pt and LMTCB x1 to make aware Please advise [facility] thanks

## 2013-10-25 NOTE — Telephone Encounter (Signed)
Order placed.  LMTCB X1 for pt

## 2013-10-26 NOTE — Telephone Encounter (Signed)
lmomtcb x2 for pt 

## 2013-10-27 LAB — HM MAMMOGRAPHY

## 2013-10-30 NOTE — Telephone Encounter (Signed)
lmtcb x1 

## 2013-10-30 NOTE — Telephone Encounter (Signed)
Called, spoke with pt.  Explained below to her regarding changing DME d/t [employer] requiring a new sleep study. She verbalized understanding and is aware order was sent to [facility] to see if they can provide this without her needing to have another sleep study.  Called [facility], [phone], spoke with Iris to check on the status of this. Was advised they received order on 10/26/13.  Per Iris, pt is scheduled to be called either today or tomorrow to schedule set up and nothing further is needed from our end.  lmomtcb for pt to provide her with update.

## 2013-10-30 NOTE — Telephone Encounter (Signed)
Pt returned call again, stated that someone just called her again.

## 2013-10-30 NOTE — Telephone Encounter (Signed)
Pt returned call

## 2013-10-31 NOTE — Telephone Encounter (Signed)
Pt returning call.Crystal Peck ° °

## 2013-10-31 NOTE — Telephone Encounter (Signed)
lmtcb x2 

## 2013-10-31 NOTE — Telephone Encounter (Signed)
I called spoke with pt. Made aware of below. She voiced her understanding and needed nothing further

## 2013-11-16 LAB — HEMOGLOBIN A1C: HEMOGLOBIN A1C: 10 % — AB (ref 4.0–6.0)

## 2013-11-21 ENCOUNTER — Ambulatory Visit: Payer: 59 | Admitting: Pulmonary Disease

## 2013-12-11 ENCOUNTER — Encounter: Payer: Self-pay | Admitting: General Practice

## 2013-12-19 ENCOUNTER — Encounter: Payer: Self-pay | Admitting: Pulmonary Disease

## 2013-12-19 ENCOUNTER — Ambulatory Visit (INDEPENDENT_AMBULATORY_CARE_PROVIDER_SITE_OTHER): Payer: 59 | Admitting: Pulmonary Disease

## 2013-12-19 VITALS — BP 130/90 | HR 100 | Temp 97.8°F | Ht 61.0 in | Wt 176.2 lb

## 2013-12-19 DIAGNOSIS — G4733 Obstructive sleep apnea (adult) (pediatric): Secondary | ICD-10-CM

## 2013-12-19 NOTE — Progress Notes (Signed)
   Subjective:    Patient ID: Crystal Peck, female    DOB: [birth year], 48 y.o.   MRN: [id number]  HPI The patient comes in today for followup of her obstructive sleep apnea. She was restarted on CPAP at the last visit, and unfortunately her downloaded shows very poor compliance. The patient denies having any issues with her mask fit or pressure, and is not able to come up with anything I can do to improve her compliance. Her download does show excellent control of her sleep apnea when she is wearing CPAP.   Review of Systems  Constitutional: Negative for fever and unexpected weight change.  HENT: Negative for congestion, dental problem, ear pain, nosebleeds, postnasal drip, rhinorrhea, sinus pressure, sneezing, sore throat and trouble swallowing.   Eyes: Negative for redness and itching.  Respiratory: Negative for cough, chest tightness, shortness of breath and wheezing.   Cardiovascular: Negative for palpitations and leg swelling.  Gastrointestinal: Negative for nausea and vomiting.  Genitourinary: Negative for dysuria.  Musculoskeletal: Negative for joint swelling.  Skin: Negative for rash.  Neurological: Negative for headaches.  Hematological: Does not bruise/bleed easily.  Psychiatric/Behavioral: Negative for dysphoric mood. The patient is not nervous/anxious.        Objective:   Physical Exam Overweight female in no acute distress Nose without purulence or discharge noted No skin breakdown or pressure necrosis from the CPAP mask Neck without lymphadenopathy or thyromegaly Lower extremities without edema, no cyanosis Alert and oriented, moves all 4 extremities.       Assessment & Plan:

## 2013-12-19 NOTE — Patient Instructions (Signed)
You need to wear your cpap everynight, and at least 6 hrs a night. Work on weight loss. Please call if you are having issues with tolerance. followup with me again in 37mos

## 2013-12-19 NOTE — Assessment & Plan Note (Signed)
The patient is not wearing CPAP compliantly, and I have had a long talk with her today. She is not having issues with mask fit or pressure, but simply needs to be more disciplined and wearing the device. She is going to make a better effort going forward. I've also encouraged her to work aggressively on weight loss.

## 2014-01-16 LAB — HEPATIC FUNCTION PANEL
ALT: 20 U/L (ref 7–35)
AST: 11 U/L — AB (ref 13–35)
BILIRUBIN, TOTAL: 0.3 mg/dL

## 2014-01-16 LAB — BASIC METABOLIC PANEL
BUN: 15 mg/dL (ref 4–21)
Creatinine: 0.7 mg/dL (ref <=1.1)
Glucose: 238 mg/dL
Potassium: 3.9 mmol/L (ref 3.4–5.3)
Sodium: 143 mmol/L (ref 137–147)

## 2014-01-16 LAB — TSH: TSH: 0.06 u[IU]/mL — AB (ref <=5.90)

## 2014-01-18 LAB — HM DIABETES EYE EXAM

## 2014-01-22 ENCOUNTER — Encounter: Payer: Self-pay | Admitting: General Practice

## 2014-01-24 ENCOUNTER — Other Ambulatory Visit: Payer: Self-pay | Admitting: Family Medicine

## 2014-01-24 NOTE — Telephone Encounter (Signed)
Med filled.  

## 2014-02-14 ENCOUNTER — Encounter (HOSPITAL_COMMUNITY): Payer: Self-pay | Admitting: Emergency Medicine

## 2014-02-14 ENCOUNTER — Inpatient Hospital Stay (HOSPITAL_COMMUNITY)
Admission: EM | Admit: 2014-02-14 | Discharge: 2014-02-15 | DRG: 310 | Disposition: A | Discharge status: Home / Self Care | Payer: 59 | Attending: Internal Medicine | Admitting: Internal Medicine

## 2014-02-14 ENCOUNTER — Emergency Department (HOSPITAL_COMMUNITY): Payer: 59

## 2014-02-14 DIAGNOSIS — J45909 Unspecified asthma, uncomplicated: Secondary | ICD-10-CM | POA: Diagnosis present

## 2014-02-14 DIAGNOSIS — I10 Essential (primary) hypertension: Secondary | ICD-10-CM | POA: Diagnosis present

## 2014-02-14 DIAGNOSIS — I480 Paroxysmal atrial fibrillation: Secondary | ICD-10-CM

## 2014-02-14 DIAGNOSIS — Z6832 Body mass index (BMI) 32.0-32.9, adult: Secondary | ICD-10-CM | POA: Diagnosis not present

## 2014-02-14 DIAGNOSIS — E669 Obesity, unspecified: Secondary | ICD-10-CM | POA: Diagnosis present

## 2014-02-14 DIAGNOSIS — E0590 Thyrotoxicosis, unspecified without thyrotoxic crisis or storm: Secondary | ICD-10-CM | POA: Diagnosis present

## 2014-02-14 DIAGNOSIS — Z79899 Other long term (current) drug therapy: Secondary | ICD-10-CM

## 2014-02-14 DIAGNOSIS — F419 Anxiety disorder, unspecified: Secondary | ICD-10-CM | POA: Diagnosis present

## 2014-02-14 DIAGNOSIS — R0602 Shortness of breath: Secondary | ICD-10-CM | POA: Diagnosis present

## 2014-02-14 DIAGNOSIS — E119 Type 2 diabetes mellitus without complications: Secondary | ICD-10-CM

## 2014-02-14 DIAGNOSIS — I369 Nonrheumatic tricuspid valve disorder, unspecified: Secondary | ICD-10-CM

## 2014-02-14 DIAGNOSIS — L03116 Cellulitis of left lower limb: Secondary | ICD-10-CM

## 2014-02-14 DIAGNOSIS — Z90710 Acquired absence of both cervix and uterus: Secondary | ICD-10-CM

## 2014-02-14 DIAGNOSIS — Z9089 Acquired absence of other organs: Secondary | ICD-10-CM | POA: Diagnosis present

## 2014-02-14 DIAGNOSIS — Z794 Long term (current) use of insulin: Secondary | ICD-10-CM

## 2014-02-14 DIAGNOSIS — I4891 Unspecified atrial fibrillation: Secondary | ICD-10-CM | POA: Diagnosis present

## 2014-02-14 DIAGNOSIS — E785 Hyperlipidemia, unspecified: Secondary | ICD-10-CM | POA: Diagnosis present

## 2014-02-14 DIAGNOSIS — R079 Chest pain, unspecified: Secondary | ICD-10-CM

## 2014-02-14 DIAGNOSIS — K219 Gastro-esophageal reflux disease without esophagitis: Secondary | ICD-10-CM

## 2014-02-14 DIAGNOSIS — G4733 Obstructive sleep apnea (adult) (pediatric): Secondary | ICD-10-CM | POA: Diagnosis present

## 2014-02-14 DIAGNOSIS — Z7982 Long term (current) use of aspirin: Secondary | ICD-10-CM | POA: Diagnosis not present

## 2014-02-14 DIAGNOSIS — R740 Nonspecific elevation of levels of transaminase and lactic acid dehydrogenase [LDH]: Secondary | ICD-10-CM

## 2014-02-14 DIAGNOSIS — D332 Benign neoplasm of brain, unspecified: Secondary | ICD-10-CM

## 2014-02-14 DIAGNOSIS — R7401 Elevation of levels of liver transaminase levels: Secondary | ICD-10-CM

## 2014-02-14 LAB — CBC
HCT: 39.6 % (ref 36.0–46.0)
HCT: 39 % (ref 36.0–46.0)
Hemoglobin: 12.9 g/dL (ref 12.0–15.0)
Hemoglobin: 12.6 g/dL (ref 12.0–15.0)
MCH: 28.5 pg (ref 26.0–34.0)
MCH: 28.3 pg (ref 26.0–34.0)
MCHC: 32.6 g/dL (ref 30.0–36.0)
MCHC: 32.3 g/dL (ref 30.0–36.0)
MCV: 87.6 fL (ref 78.0–100.0)
MCV: 87.6 fL (ref 78.0–100.0)
PLATELETS: 326 10*3/uL (ref 150–400)
Platelets: 306 10*3/uL (ref 150–400)
RBC: 4.52 MIL/uL (ref 3.87–5.11)
RBC: 4.45 MIL/uL (ref 3.87–5.11)
RDW: 13.3 % (ref 11.5–15.5)
RDW: 13.4 % (ref 11.5–15.5)
WBC: 5.7 10*3/uL (ref 4.0–10.5)
WBC: 5.9 10*3/uL (ref 4.0–10.5)

## 2014-02-14 LAB — T4, FREE: Free T4: 1.11 ng/dL (ref 0.80–1.80)

## 2014-02-14 LAB — BASIC METABOLIC PANEL
Anion gap: 13 (ref 5–15)
BUN: 22 mg/dL (ref 6–23)
CO2: 26 mEq/L (ref 19–32)
Calcium: 9.8 mg/dL (ref 8.4–10.5)
Chloride: 102 mEq/L (ref 96–112)
Creatinine, Ser: 0.78 mg/dL (ref 0.50–1.10)
GFR calc Af Amer: >90 mL/min (ref >90)
GFR calc non Af Amer: >90 mL/min (ref >90)
Glucose, Bld: 217 mg/dL — ABNORMAL HIGH (ref 70–99)
Potassium: 3.7 mEq/L (ref 3.7–5.3)
Sodium: 141 mEq/L (ref 137–147)

## 2014-02-14 LAB — I-STAT TROPONIN, ED: Troponin i, poc: 0.01 ng/mL (ref 0.00–0.08)

## 2014-02-14 LAB — CREATININE, SERUM
CREATININE: 0.68 mg/dL (ref 0.50–1.10)
GFR calc non Af Amer: >90 mL/min (ref >90)

## 2014-02-14 LAB — TROPONIN I: Troponin I: <0.3 ng/mL (ref <0.30)

## 2014-02-14 LAB — MRSA PCR SCREENING: MRSA by PCR: NEGATIVE

## 2014-02-14 LAB — GLUCOSE, CAPILLARY
GLUCOSE-CAPILLARY: 209 mg/dL — AB (ref 70–99)
Glucose-Capillary: 135 mg/dL — ABNORMAL HIGH (ref 70–99)
Glucose-Capillary: 201 mg/dL — ABNORMAL HIGH (ref 70–99)

## 2014-02-14 LAB — T3, FREE: T3 FREE: 3.7 pg/mL (ref 2.3–4.2)

## 2014-02-14 LAB — TSH: TSH: 0.135 u[IU]/mL — ABNORMAL LOW (ref 0.350–4.500)

## 2014-02-14 LAB — HEMOGLOBIN A1C
HEMOGLOBIN A1C: 9 % — AB (ref <5.7)
Mean Plasma Glucose: 212 mg/dL — ABNORMAL HIGH (ref <117)

## 2014-02-14 MED ORDER — DILTIAZEM HCL 100 MG IV SOLR
5.0000 mg/h | INTRAVENOUS | Status: DC
  Administered 2014-02-14: 10 mg/h via INTRAVENOUS
  Filled 2014-02-14: qty 100

## 2014-02-14 MED ORDER — DILTIAZEM LOAD VIA INFUSION
20.0000 mg | Freq: Once | INTRAVENOUS | Status: DC
  Filled 2014-02-14: qty 20

## 2014-02-14 MED ORDER — ENOXAPARIN SODIUM 80 MG/0.8ML ~~LOC~~ SOLN
80.0000 mg | SUBCUTANEOUS | Status: AC
  Administered 2014-02-14: 80 mg via SUBCUTANEOUS
  Filled 2014-02-14: qty 0.8

## 2014-02-14 MED ORDER — ATORVASTATIN CALCIUM 40 MG PO TABS
40.0000 mg | ORAL_TABLET | Freq: Every day | ORAL | Status: DC
  Administered 2014-02-14: 40 mg via ORAL
  Filled 2014-02-14: qty 1

## 2014-02-14 MED ORDER — SODIUM CHLORIDE 0.9 % IJ SOLN
3.0000 mL | Freq: Two times a day (BID) | INTRAMUSCULAR | Status: DC
  Administered 2014-02-14 – 2014-02-15 (×2): 3 mL via INTRAVENOUS

## 2014-02-14 MED ORDER — DILTIAZEM HCL 25 MG/5ML IV SOLN
20.0000 mg | Freq: Once | INTRAVENOUS | Status: AC
  Administered 2014-02-14: 20 mg via INTRAVENOUS
  Filled 2014-02-14: qty 5

## 2014-02-14 MED ORDER — PANTOPRAZOLE SODIUM 40 MG PO TBEC
40.0000 mg | DELAYED_RELEASE_TABLET | Freq: Every day | ORAL | Status: DC
  Administered 2014-02-14 – 2014-02-15 (×2): 40 mg via ORAL
  Filled 2014-02-14 (×2): qty 1

## 2014-02-14 MED ORDER — GLIPIZIDE 10 MG PO TABS
10.0000 mg | ORAL_TABLET | Freq: Every day | ORAL | Status: DC
  Administered 2014-02-15: 10 mg via ORAL
  Filled 2014-02-14: qty 1

## 2014-02-14 MED ORDER — OXYCODONE HCL 5 MG PO TABS: 5.0000 mg | ORAL_TABLET | ORAL | Status: DC | PRN

## 2014-02-14 MED ORDER — DEXTROSE 5 % IV SOLN
5.0000 mg/h | Freq: Once | INTRAVENOUS | Status: AC
  Administered 2014-02-14: 5 mg/h via INTRAVENOUS

## 2014-02-14 MED ORDER — ONDANSETRON HCL 4 MG/2ML IJ SOLN
4.0000 mg | Freq: Once | INTRAMUSCULAR | Status: AC
  Administered 2014-02-14: 4 mg via INTRAVENOUS
  Filled 2014-02-14: qty 2

## 2014-02-14 MED ORDER — INSULIN ASPART 100 UNIT/ML ~~LOC~~ SOLN
0.0000 [IU] | Freq: Three times a day (TID) | SUBCUTANEOUS | Status: DC
Start: 2014-02-14 — End: 2014-02-15
  Administered 2014-02-14: 5 [IU] via SUBCUTANEOUS
  Administered 2014-02-15: 3 [IU] via SUBCUTANEOUS
  Administered 2014-02-15: 2 [IU] via SUBCUTANEOUS

## 2014-02-14 MED ORDER — SODIUM CHLORIDE 0.9 % IJ SOLN: 3.0000 mL | INTRAMUSCULAR | Status: DC | PRN

## 2014-02-14 MED ORDER — INSULIN ASPART 100 UNIT/ML ~~LOC~~ SOLN
0.0000 [IU] | Freq: Every day | SUBCUTANEOUS | Status: DC
  Administered 2014-02-14: 2 [IU] via SUBCUTANEOUS

## 2014-02-14 MED ORDER — OFF THE BEAT BOOK
Freq: Once | Status: AC
  Administered 2014-02-15
  Filled 2014-02-14: qty 1

## 2014-02-14 MED ORDER — FISH OIL 1000 MG PO CAPS: 1000.0000 mg | ORAL_CAPSULE | Freq: Three times a day (TID) | ORAL | Status: DC

## 2014-02-14 MED ORDER — ACETAMINOPHEN 650 MG RE SUPP: 650.0000 mg | Freq: Four times a day (QID) | RECTAL | Status: DC | PRN

## 2014-02-14 MED ORDER — ATENOLOL-CHLORTHALIDONE 50-25 MG PO TABS: 1.0000 | ORAL_TABLET | Freq: Every day | ORAL | Status: DC

## 2014-02-14 MED ORDER — ENOXAPARIN SODIUM 80 MG/0.8ML ~~LOC~~ SOLN
80.0000 mg | Freq: Two times a day (BID) | SUBCUTANEOUS | Status: DC
  Administered 2014-02-14: 80 mg via SUBCUTANEOUS
  Filled 2014-02-14: qty 0.8

## 2014-02-14 MED ORDER — ONDANSETRON HCL 4 MG/2ML IJ SOLN
4.0000 mg | Freq: Four times a day (QID) | INTRAMUSCULAR | Status: DC | PRN
  Administered 2014-02-14: 4 mg via INTRAVENOUS
  Filled 2014-02-14: qty 2

## 2014-02-14 MED ORDER — METHIMAZOLE 5 MG PO TABS
15.0000 mg | ORAL_TABLET | Freq: Every day | ORAL | Status: DC
  Administered 2014-02-15: 15 mg via ORAL
  Filled 2014-02-14: qty 1

## 2014-02-14 MED ORDER — ALUM & MAG HYDROXIDE-SIMETH 200-200-20 MG/5ML PO SUSP: 30.0000 mL | Freq: Four times a day (QID) | ORAL | Status: DC | PRN

## 2014-02-14 MED ORDER — ACETAMINOPHEN 325 MG PO TABS: 650.0000 mg | ORAL_TABLET | Freq: Four times a day (QID) | ORAL | Status: DC | PRN

## 2014-02-14 MED ORDER — METHIMAZOLE 10 MG PO TABS
10.0000 mg | ORAL_TABLET | Freq: Every day | ORAL | Status: DC
  Administered 2014-02-14: 10 mg via ORAL
  Filled 2014-02-14: qty 1

## 2014-02-14 MED ORDER — ATENOLOL 25 MG PO TABS
50.0000 mg | ORAL_TABLET | Freq: Every day | ORAL | Status: DC
  Administered 2014-02-14: 50 mg via ORAL
  Filled 2014-02-14 (×2): qty 2

## 2014-02-14 MED ORDER — INSULIN DETEMIR 100 UNIT/ML ~~LOC~~ SOLN
80.0000 [IU] | Freq: Every morning | SUBCUTANEOUS | Status: DC
  Administered 2014-02-14 – 2014-02-15 (×2): 80 [IU] via SUBCUTANEOUS
  Filled 2014-02-14 (×2): qty 0.8

## 2014-02-14 MED ORDER — OMEGA-3-ACID ETHYL ESTERS 1 G PO CAPS
1.0000 g | ORAL_CAPSULE | Freq: Three times a day (TID) | ORAL | Status: DC
  Administered 2014-02-14 – 2014-02-15 (×3): 1 g via ORAL
  Filled 2014-02-14 (×3): qty 1

## 2014-02-14 MED ORDER — SODIUM CHLORIDE 0.9 % IV SOLN: 250.0000 mL | INTRAVENOUS | Status: DC | PRN

## 2014-02-14 MED ORDER — CHLORTHALIDONE 25 MG PO TABS
25.0000 mg | ORAL_TABLET | Freq: Every day | ORAL | Status: DC
  Administered 2014-02-14: 25 mg via ORAL
  Filled 2014-02-14 (×2): qty 1

## 2014-02-14 MED ORDER — ONDANSETRON HCL 4 MG PO TABS: 4.0000 mg | ORAL_TABLET | Freq: Four times a day (QID) | ORAL | Status: DC | PRN

## 2014-02-14 MED ORDER — ASPIRIN 325 MG PO TABS
325.0000 mg | ORAL_TABLET | Freq: Once | ORAL | Status: AC
  Administered 2014-02-14: 325 mg via ORAL
  Filled 2014-02-14: qty 1

## 2014-02-14 NOTE — Consult Note (Addendum)
CARDIOLOGY CONSULT NOTE   Patient ID: Crystal Peck MRN: [id number], DOB/AGE: 1965-05-26   Admit date: 02/14/2014 Date of Consult: 02/14/2014   Primary Physician: [name], MD Primary Cardiologist: None  Pt. Profile  48 year old woman without prior history of known heart problems presents with complaints of nausea vomiting and weakness and found to be in atrial fibrillation with rapid ventricular response.  Problem List  Past Medical History  Diagnosis Date  . Asthma   . Diabetes mellitus   . Hyperlipidemia   . Hypertension   . GERD (gastroesophageal reflux disease)   . Cerebellar tumor   . Obesity   . Sleep apnea     Past Surgical History  Procedure Laterality Date  . Cholecystectomy    . Partial hysterectomy      ovaries remain  . Nasal sinus surgery      Dr. Lyn Hollingshead  . Abdominal hysterectomy       Allergies  No Known Allergies  HPI   This pleasant 48 year old woman is an employee of [employer] hospital and works in [employer] as a Scientist, water quality.  She does not have any history of prior heart disease.  She does have a history of asthma, diabetes mellitus, hyperlipidemia, hypertension, and GERD.  She also has a history of sleep apnea.  She has not felt well since last Friday 5 days ago.  At that time she had nausea but did not vomit.  3 days ago on Sunday she was nauseated and vomited once.  Last night she awoke from sleep.  She was wearing her CPAP machine.  She noted that she was nauseated and she vomited once.  This morning she was also weak and short of breath while climbing up stairs. She has a history of overactive thyroid and has been on methimazole.  She states that she on her own doubled up on her dose of methimazole for the last 2 nights. She has noted only vague chest discomfort without radiation.  She cannot be sure when her irregular pulse started.  She thinks that it may have been present on Friday when she felt nauseated. In the emergency room her  EKG shows no acute ischemic changes.  Her chest x-ray shows no evidence of congestive heart failure.  The heart is upper normal in size.  Her initial point of care troponin is normal.  Her TSH is suppressed.  Free T4 and T3 are pending. She denies any history of GI bleeding.  Inpatient Medications    Family History Family History  Problem Relation Age of Onset  . Coronary artery disease Father   . Hypertension Father   . Diabetes      maternal grandparents  . Hyperlipidemia Other      Social History History   Social History  . Marital Status: Legally Separated    Spouse Name: N/A    Number of Children: N/A  . Years of Education: N/A   Occupational History  . Not on file.   Social History Main Topics  . Smoking status: Never Smoker   . Smokeless tobacco: Not on file  . Alcohol Use: No  . Drug Use: No  . Sexual Activity:    Other Topics Concern  . Not on file   Social History Narrative  . No narrative on file     Review of Systems  General:  No chills, fever, night sweats or weight changes.  Cardiovascular:  No chest pain, , edema, orthopnea, palpitations, paroxysmal nocturnal dyspnea.  Positive  for dyspnea on exertion Dermatological: No rash, lesions/masses Respiratory: No cough, dyspnea.  Positive for sleep apnea and wears a CPAP machine Urologic: No hematuria, dysuria Abdominal:   No nausea, vomiting, diarrhea, bright red blood per rectum, melena, or hematemesis Neurologic:  No visual changes, wkns, changes in mental status. All other systems reviewed and are otherwise negative except as noted above.  Physical Exam  Blood pressure 127/85, pulse 82, temperature 98.3 F (36.8 C), temperature source Oral, resp. rate 15, height 5\' 1"  (1.549 m), weight 170 lb (77.111 kg), SpO2 100.00%.  General: Pleasant, NAD Psych: Normal affect. Neuro: Alert and oriented X 3. Moves all extremities spontaneously. HEENT: Normal  Neck: Supple without bruits or JVD. Lungs:   Resp regular and unlabored, CTA. Heart: RRR no s3, s4, or murmurs. Abdomen: Soft, non-tender, non-distended, BS + x 4.  Extremities: No clubbing, cyanosis or edema. DP/PT/Radials 2+ and equal bilaterally.  Labs  No results found for this basename: CKTOTAL, CKMB, TROPONINI,  in the last 72 hours Lab Results  Component Value Date   WBC 5.7 02/14/2014   HGB 12.9 02/14/2014   HCT 39.6 02/14/2014   MCV 87.6 02/14/2014   PLT 306 02/14/2014    Recent Labs Lab 02/14/14 0715  NA 141  K 3.7  CL 102  CO2 26  BUN 22  CREATININE 0.78  CALCIUM 9.8  GLUCOSE 217*   Lab Results  Component Value Date   CHOL 223* 08/25/2013   HDL 44.90 08/25/2013   LDLCALC 160* 08/25/2013   TRIG 91.0 08/25/2013   Lab Results  Component Value Date   DDIMER <0.27 06/01/2013    Radiology/Studies  Dg Chest 2 View  02/14/2014   CLINICAL DATA:  Acute onset chest pain with difficulty breathing  EXAM: CHEST  2 VIEW  COMPARISON:  June 01, 2013  FINDINGS: There is no edema or consolidation. Heart is upper normal in size with pulmonary vascularity within normal limits. No adenopathy. No pneumothorax. No bone lesions. There are surgical clips in the right upper quadrant of the abdomen.  IMPRESSION: No edema or consolidation.   Electronically Signed   By: Lowella Grip M.D.   On: 02/14/2014 07:27    ECG  Reviewed by me shows atrial fibrillation with rapid ventricular response.  No ischemic changes seen.  ASSESSMENT AND PLAN  1.  Atrial fibrillation with rapid ventricular response, uncertain duration but may have been present for the past 5 days.  She has not felt well for the past 5 days.  Her CHADSS-Vasc score is 3 for hypertension, diabetes, and female sex. 2.  Past history of overactive thyroid.  Current thyroid function studies are pending.  TSH is suppressed.  If she is hyperthyroid, this could certainly be playing a role in her atrial fibrillation. 3.  History of asthma. 4.  History of obstructive sleep  apnea. 5.  History of hypertension without heart failure 6.  Hypercholesterolemia 7.  Type 2 diabetes, insulin-dependent  Recommendation: We will check a free T4 and a T3.  Agree with use of IV diltiazem drip for rate control.  Continue outpatient Tenoretic.  She will benefit from anticoagulation.  We can start with IV heparin or Lovenox.  Can consider either warfarin or NOAC at discharge.  It is likely that she may convert back to normal sinus rhythm on IV diltiazem and with close monitoring of her thyroid status.  If she fails to convert to normal sinus rhythm she could be considered for 3-4 weeks of outpatient anticoagulation  and then outpatient direct current cardioversion. Will follow with you.  Wynonia Musty Mida Cory,MD  02/14/2014, 11:39 AM

## 2014-02-14 NOTE — ED Notes (Signed)
Called pharmacy about delay for meds

## 2014-02-14 NOTE — Progress Notes (Signed)
Follow-up Note I spoke with her endocrinologist Dr. Posey Pronto, who recommended increasing patient's methimazole to 15 mg by mouth daily. Patient's follow-up appointment at the endocrinology office will be moved up in several weeks.

## 2014-02-14 NOTE — ED Notes (Signed)
Patient transported to X-ray 

## 2014-02-14 NOTE — ED Notes (Signed)
Pt stated that she was at home and was on her cpap and woke up feeling nauseated. Pt stated she vomited and layed back down. Pt stated she arrived at work and became real dizzy, hot and diaphoretic. Pt became real short of breath on exertion up the stairs.

## 2014-02-14 NOTE — Care Management Note (Unsigned)
    Page 1 of 1   02/14/2014     2:54:28 PM CARE MANAGEMENT NOTE 02/14/2014  Patient:  Crystal Peck, Crystal Peck   Account Number:  [id number]  Date Initiated:  02/14/2014  Documentation initiated by:  GRAVES-BIGELOW,Klaus Casteneda  Subjective/Objective Assessment:   Pt admitted for Atrial fibrillation with RVR. Initiated on IV Cardizem gtt.     Action/Plan:   CM will continue to monitor for disposition needs.   Anticipated DC Date:  02/16/2014   Anticipated DC Plan:  [place]  CM consult      Choice offered to / List presented to:             Status of service:  In process, will continue to follow Medicare Important Message given?   (If response is "NO", the following Medicare IM given date fields will be blank) Date Medicare IM given:   Medicare IM given by:   Date Additional Medicare IM given:   Additional Medicare IM given by:    Discharge Disposition:    Per UR Regulation:  Reviewed for med. necessity/level of care/duration of stay  If discussed at [place] of Stay Meetings, dates discussed:    Comments:

## 2014-02-14 NOTE — ED Provider Notes (Signed)
CSN: [id number]     Arrival date & time 02/14/14  8938 History   First MD Initiated Contact with Patient 02/14/14 [phone]     Chief Complaint  Patient presents with  . Asthma  . Anxiety     (Consider location/radiation/quality/duration/timing/severity/associated sxs/prior Treatment) HPI Pt is a 48yo female with hx of asthma, DM, hyperlipidemia, HTN, GERD, cerebellar tumor, and sleep apnea presenting to ED with c/o "not feeling well."  Pt states she was at home on her CPAP when she awoke feeling nauseated, vomited once. Pt was able to rest at home a few more minutes prior to leaving for work, when she got to work she felt dizzy, hot, and diaphoretic.  States she was weak and became SOB walking up stairs.  Upon arrival to ED, reports still feeling nauseated "In my throat" and diffuse, mild, aching chest pain. Chest pain is not pleuritic.  States she did feel bad on Friday, 10/23, but did not think much of it. Reports having a thyroid disease and increased her thyroid medication from 5mg  to 10mg  about one month ago but has not issues since Friday.  Pt does report hx of anxiety as well as FH of heart disease with her father having an MI in his 31s but denies personal hx of cardiac disease. Denies hx of DVT or PE. Denies recent travel or sick contacts.   Past Medical History  Diagnosis Date  . Asthma   . Diabetes mellitus   . Hyperlipidemia   . Hypertension   . GERD (gastroesophageal reflux disease)   . Cerebellar tumor   . Obesity   . Sleep apnea    Past Surgical History  Procedure Laterality Date  . Cholecystectomy    . Partial hysterectomy      ovaries remain  . Nasal sinus surgery      Dr. Lyn Hollingshead  . Abdominal hysterectomy     Family History  Problem Relation Age of Onset  . Coronary artery disease Father   . Hypertension Father   . Diabetes      maternal grandparents  . Hyperlipidemia Other    History  Substance Use Topics  . Smoking status: Never Smoker   . Smokeless  tobacco: Not on file  . Alcohol Use: No   OB History   Grav Para Term Preterm Abortions TAB SAB Ect Mult Living                 Review of Systems  Constitutional: Positive for diaphoresis and fatigue. Negative for fever and chills.  Respiratory: Positive for shortness of breath. Negative for cough.   Cardiovascular: Positive for chest pain and palpitations ("racing"). Negative for leg swelling.  Gastrointestinal: Positive for nausea and vomiting. Negative for abdominal pain and diarrhea.  Neurological: Positive for weakness.  All other systems reviewed and are negative.     Allergies  Review of patient's allergies indicates no known allergies.  Home Medications   Prior to Admission medications   Medication Sig Start Date End Date Taking? Authorizing Provider  albuterol (PROVENTIL HFA;VENTOLIN HFA) 108 (90 BASE) MCG/ACT inhaler Inhale 2 puffs into the lungs every 4 (four) hours as needed for wheezing or shortness of breath.   Yes Historical Provider, MD  aspirin 81 MG tablet Take 81 mg by mouth daily.   Yes Historical Provider, MD  atenolol-chlorthalidone (TENORETIC) 50-25 MG per tablet Take 1 tablet by mouth daily. 02/24/13  Yes Midge Minium, MD  atorvastatin (LIPITOR) 40 MG tablet Take 40 mg by  mouth daily.   Yes Historical Provider, MD  azelastine (OPTIVAR) 0.05 % ophthalmic solution Place 1 drop into both eyes 2 (two) times daily as needed (for allergies).   Yes Historical Provider, MD  beclomethasone (QVAR) 80 MCG/ACT inhaler Inhale 1 puff into the lungs 2 (two) times daily as needed (asthma).   Yes Historical Provider, MD  cetirizine (ZYRTEC) 10 MG tablet Take 1 tablet (10 mg total) by mouth daily. 06/29/13  Yes Eleanore E Egan, PA-C  glipiZIDE (GLUCOTROL) 10 MG tablet Take 10 mg by mouth daily before breakfast.   Yes Historical Provider, MD  insulin detemir (LEVEMIR FLEXPEN) 100 UNIT/ML injection Inject 80 Units into the skin every morning.   Yes Historical Provider, MD   IRON PO Take 1 tablet by mouth daily.   Yes Historical Provider, MD  lisinopril (PRINIVIL,ZESTRIL) 5 MG tablet Take 5 mg by mouth daily.   Yes Historical Provider, MD  metFORMIN (GLUCOPHAGE) 1000 MG tablet Take 1,000 mg by mouth 2 (two) times daily with a meal.   Yes Historical Provider, MD  methimazole (TAPAZOLE) 10 MG tablet Take 10 mg by mouth daily.   Yes Historical Provider, MD  Multiple Vitamin (MULTIVITAMIN WITH MINERALS) TABS tablet Take 1 tablet by mouth daily.   Yes Historical Provider, MD  Omega-3 Fatty Acids (FISH OIL) 1000 MG CAPS Take 1,000 mg by mouth 3 (three) times daily.    Yes Historical Provider, MD  pantoprazole (PROTONIX) 40 MG tablet Take 40 mg by mouth daily.   Yes Historical Provider, MD  potassium chloride SA (K-DUR,KLOR-CON) 20 MEQ tablet Take 20 mEq by mouth daily as needed (for leg cramping). Take one tablet by mouth daily. 02/26/12  Yes Midge Minium, MD  glucose blood test strip 1 each by Other route as needed. Onetouch ultra test strip     Historical Provider, MD   BP 127/85  Pulse 82  Temp(Src) 98.3 F (36.8 C) (Oral)  Resp 15  Ht 5\' 1"  (1.549 m)  Wt 170 lb (77.111 kg)  BMI 32.14 kg/m2  SpO2 100% Physical Exam  Nursing note and vitals reviewed. Constitutional: She appears well-developed and well-nourished. No distress.  HENT:  Head: Normocephalic and atraumatic.  Eyes: Conjunctivae are normal. No scleral icterus.  Neck: Normal range of motion.  Cardiovascular: Normal heart sounds.  An irregularly irregular rhythm present. Tachycardia present.   Pulmonary/Chest: Effort normal and breath sounds normal. No respiratory distress. She has no wheezes. She has no rales. She exhibits no tenderness.  Abdominal: Soft. Bowel sounds are normal. She exhibits no distension and no mass. There is no tenderness. There is no rebound and no guarding.  Musculoskeletal: Normal range of motion.  Neurological: She is alert.  Skin: Skin is warm and dry. She is not  diaphoretic.    ED Course  Procedures (including critical care time) Labs Review Labs Reviewed  BASIC METABOLIC PANEL - Abnormal; Notable for the following:    Glucose, Bld 217 (*)    All other components within normal limits  TSH - Abnormal; Notable for the following:    TSH 0.135 (*)    All other components within normal limits  CBC  T4, FREE  I-STAT TROPOININ, ED    Imaging Review Dg Chest 2 View  02/14/2014   CLINICAL DATA:  Acute onset chest pain with difficulty breathing  EXAM: CHEST  2 VIEW  COMPARISON:  June 01, 2013  FINDINGS: There is no edema or consolidation. Heart is upper normal in size with pulmonary  vascularity within normal limits. No adenopathy. No pneumothorax. No bone lesions. There are surgical clips in the right upper quadrant of the abdomen.  IMPRESSION: No edema or consolidation.   Electronically Signed   By: Lowella Grip M.D.   On: 02/14/2014 07:27     EKG Interpretation   Date/Time:  Wednesday February 14 2014 06:57:21 EDT Ventricular Rate:  144 PR Interval:    QRS Duration: 82 QT Interval:  268 QTC Calculation: 414 R Axis:   11 Text Interpretation:  Atrial fibrillation with rapid ventricular response  with premature ventricular or aberrantly conducted complexes Abnormal ECG  atrial fibrillation is new compared to Feb 2015 Confirmed by GOLDSTON  MD,  SCOTT ([id number]) on 02/14/2014 7:40:47 AM      MDM   Final diagnoses:  Chest pain  Atrial fibrillation, unspecified  Shortness of breath    Pt is a 48yo female with multiple risk factors for CAD presenting to ED with c/o "not feeling well" associated chest pain, weakness, nausea and vomiting that started earlier this morning.  Upon arrival to ED, pt appears uncomfortable but no respiratory distress.  Irregularly irregular tachycardic HR.  Lungs: CTAB.  Discussed pt with Dr. Regenia Skeeter who also examined pt, will start pt on diltiazem as she is in 130s-140s.  Labs: CBC, BMP, istat troponin. Due  to hx of thyroid disease TSH and T4 ordered.     Troponin, CBC, and BMP: unremarkable.  CXR: no edema or consolidation.  8:09 AM TSH and T4 still pending.  Diltiazem has not been started yet as it is coming from main pharmacy.  Pt states she does feel better, however, HR still 130s-140s.  Discussed pt with Dr. Regenia Skeeter, will consult cardiology after diltiazem has been given as this is pt's first presentation of Afib with RVR. Pt denies previous hx of arrhythmias   9:16 AM Pt given diltiazem, HR responded appropriately, HR improved to 90bpm. Will consult cardiology.   9:23 AM Consulted with cardiology who agreed to come examine pt.   11:39 AM Consulted with Dr. Mare Ferrari, cardiology who requested pt be admitted by medicine team, however, they will continue to act as a consult as needed.   PCP: Dr. [name], [place] Will consult with [facility] to admit pt.  11:50 AM Consulted with Dr. Coralyn Pear, [facility], who agreed to admit pt to a tele bed and stated he will be placing the admission orders.     Noland Fordyce, PA-C 02/14/14 1216

## 2014-02-14 NOTE — H&P (Signed)
[facility] History and Physical  Crystal Peck OYD:[id number] DOB: Apr 22, 1965 DOA: 02/14/2014  Referring physician:  PCP: [name], MD   Chief Complaint: "Not Feeling Well"  HPI: Crystal Peck is a 48 y.o. female with a past medical history hyperthyroidism, currently on methimazole therapy, hypertension, diabetes, presenting to the emergency department with complaints of not feeling well for the past 2-3 days. She reports having generalized weakness, fatigue, poor tolerance to physical exertion. She also reported intermittent episodes of chest pain located in the retrosternal region characterized as burning. She attributed her chest pain to gastroesophageal reflux disease. In the emergency department she was found to be in A. fib with RVR having ventricular rates in the 130s to 140s and started on IV Cardizem. Heart rate subsequently coming down into the 90s to 100s. Patient was seen and evaluated by cardiology. Lab work showing a TSH of 0.135, chest x-ray showed no acute cardiopulmonary disease, electrolytes within normal limits. Patient reporting feeling better in the emergency department, currently denies chest pain.                                                                                                                                                                                                                                Review of Systems:  Constitutional:  No weight loss, night sweats, Fevers, chills, positive for fatigue.  HEENT:  No headaches, Difficulty swallowing,Tooth/dental problems,Sore throat,  No sneezing, itching, ear ache, nasal congestion, post nasal drip,  Cardio-vascular:  Positive for chest pain, denies Orthopnea, PND, swelling in lower extremities, anasarca, dizziness, palpitations  GI:  No heartburn, indigestion, abdominal pain, nausea, vomiting, diarrhea, change in bowel habits, loss of appetite  Resp:  No shortness of breath with exertion  or at rest. No excess mucus, no productive cough, No non-productive cough, No coughing up of blood.No change in color of mucus.No wheezing.No chest wall deformity  Skin:  no rash or lesions.  GU:  no dysuria, change in color of urine, no urgency or frequency. No flank pain.  Musculoskeletal:  No joint pain or swelling. No decreased range of motion. No back pain.  Psych:  No change in mood or affect. No depression or anxiety. No memory loss.   Past Medical History  Diagnosis Date  . Asthma   . Diabetes mellitus   . Hyperlipidemia   . Hypertension   . GERD (gastroesophageal reflux disease)   . Cerebellar tumor   .  Obesity   . Sleep apnea    Past Surgical History  Procedure Laterality Date  . Cholecystectomy    . Partial hysterectomy      ovaries remain  . Nasal sinus surgery      Dr. Lyn Hollingshead  . Abdominal hysterectomy     Social History:  reports that she has never smoked. She does not have any smokeless tobacco history on file. She reports that she does not drink alcohol or use illicit drugs.  No Known Allergies  Family History  Problem Relation Age of Onset  . Coronary artery disease Father   . Hypertension Father   . Diabetes      maternal grandparents  . Hyperlipidemia Other      Prior to Admission medications   Medication Sig Start Date End Date Taking? Authorizing Provider  albuterol (PROVENTIL HFA;VENTOLIN HFA) 108 (90 BASE) MCG/ACT inhaler Inhale 2 puffs into the lungs every 4 (four) hours as needed for wheezing or shortness of breath.   Yes Historical Provider, MD  aspirin 81 MG tablet Take 81 mg by mouth daily.   Yes Historical Provider, MD  atenolol-chlorthalidone (TENORETIC) 50-25 MG per tablet Take 1 tablet by mouth daily. 02/24/13  Yes Midge Minium, MD  atorvastatin (LIPITOR) 40 MG tablet Take 40 mg by mouth daily.   Yes Historical Provider, MD  azelastine (OPTIVAR) 0.05 % ophthalmic solution Place 1 drop into both eyes 2 (two) times daily as  needed (for allergies).   Yes Historical Provider, MD  beclomethasone (QVAR) 80 MCG/ACT inhaler Inhale 1 puff into the lungs 2 (two) times daily as needed (asthma).   Yes Historical Provider, MD  cetirizine (ZYRTEC) 10 MG tablet Take 1 tablet (10 mg total) by mouth daily. 06/29/13  Yes Eleanore E Egan, PA-C  glipiZIDE (GLUCOTROL) 10 MG tablet Take 10 mg by mouth daily before breakfast.   Yes Historical Provider, MD  insulin detemir (LEVEMIR FLEXPEN) 100 UNIT/ML injection Inject 80 Units into the skin every morning.   Yes Historical Provider, MD  IRON PO Take 1 tablet by mouth daily.   Yes Historical Provider, MD  lisinopril (PRINIVIL,ZESTRIL) 5 MG tablet Take 5 mg by mouth daily.   Yes Historical Provider, MD  metFORMIN (GLUCOPHAGE) 1000 MG tablet Take 1,000 mg by mouth 2 (two) times daily with a meal.   Yes Historical Provider, MD  methimazole (TAPAZOLE) 10 MG tablet Take 10 mg by mouth daily.   Yes Historical Provider, MD  Multiple Vitamin (MULTIVITAMIN WITH MINERALS) TABS tablet Take 1 tablet by mouth daily.   Yes Historical Provider, MD  Omega-3 Fatty Acids (FISH OIL) 1000 MG CAPS Take 1,000 mg by mouth 3 (three) times daily.    Yes Historical Provider, MD  pantoprazole (PROTONIX) 40 MG tablet Take 40 mg by mouth daily.   Yes Historical Provider, MD  potassium chloride SA (K-DUR,KLOR-CON) 20 MEQ tablet Take 20 mEq by mouth daily as needed (for leg cramping). Take one tablet by mouth daily. 02/26/12  Yes Midge Minium, MD  glucose blood test strip 1 each by Other route as needed. Onetouch ultra test strip     Historical Provider, MD   Physical Exam: Filed Vitals:   02/14/14 1030 02/14/14 1105 02/14/14 1112 02/14/14 1115  BP: 144/79 113/58 113/58 127/85  Pulse:   115 82  Temp:   98.3 F (36.8 C)   TempSrc:   Oral   Resp: 23 17 17 15   Height:      Weight:  SpO2:  94% 100% 100%    Wt Readings from Last 3 Encounters:  02/14/14 77.111 kg (170 lb)  12/19/13 79.924 kg (176 lb 3.2  oz)  08/30/13 82.373 kg (181 lb 9.6 oz)    General:  Appears calm and comfortable Eyes: PERRL, normal lids, irises & conjunctiva ENT: grossly normal hearing, lips & tongue Neck: no LAD, masses or thyromegaly Cardiovascular: Tachycardic, irregular rate and rhythm, normal S1-S2, no murmurs rubs or gallops, no extremity edema Telemetry: SR, no arrhythmias  Respiratory: CTA bilaterally, no w/r/r. Normal respiratory effort. Abdomen: soft, ntnd Skin: no rash or induration seen on limited exam Musculoskeletal: grossly normal tone BUE/BLE Psychiatric: grossly normal mood and affect, speech fluent and appropriate Neurologic: grossly non-focal.          Labs on Admission:  Basic Metabolic Panel:  Recent Labs Lab 02/14/14 0715  NA 141  K 3.7  CL 102  CO2 26  GLUCOSE 217*  BUN 22  CREATININE 0.78  CALCIUM 9.8   Liver Function Tests: No results found for this basename: AST, ALT, ALKPHOS, BILITOT, PROT, ALBUMIN,  in the last 168 hours No results found for this basename: LIPASE, AMYLASE,  in the last 168 hours No results found for this basename: AMMONIA,  in the last 168 hours CBC:  Recent Labs Lab 02/14/14 0715  WBC 5.7  HGB 12.9  HCT 39.6  MCV 87.6  PLT 306   Cardiac Enzymes: No results found for this basename: CKTOTAL, CKMB, CKMBINDEX, TROPONINI,  in the last 168 hours  BNP (last 3 results) No results found for this basename: PROBNP,  in the last 8760 hours CBG: No results found for this basename: GLUCAP,  in the last 168 hours  Radiological Exams on Admission: Dg Chest 2 View  02/14/2014   CLINICAL DATA:  Acute onset chest pain with difficulty breathing  EXAM: CHEST  2 VIEW  COMPARISON:  June 01, 2013  FINDINGS: There is no edema or consolidation. Heart is upper normal in size with pulmonary vascularity within normal limits. No adenopathy. No pneumothorax. No bone lesions. There are surgical clips in the right upper quadrant of the abdomen.  IMPRESSION: No edema  or consolidation.   Electronically Signed   By: Lowella Grip M.D.   On: 02/14/2014 07:27    EKG: Independently reviewed. A. fib with RVR  Assessment/Plan Principal Problem:   Atrial fibrillation with RVR Active Problems:   Dyslipidemia   Diabetes mellitus with no complication   HTN (hypertension)   A-fib   1. Atrial fibrillation with rapid ventricular response, new diagnosis. Patient having a history of hypertension, type 2 diabetes mellitus, hyperthyroidism presenting with A. fib and RVR with ventricular rates in the 130s to 140s. Rates improved with the demonstration of IV Cardizem. She was seen and evaluated by cardiology recommending continuing IV Cardizem. She has a CHADS/Vasc score of at least 2, will start anticoagulation with Lovenox. Patient reporting chest discomfort, will cycle troponins and obtain transthoracic echocardiogram. Labs did reveal a TSH of 0.135. Prior to that she had a TSH of 0.06 on 01/16/2014. Will check a free T3 and a free T4, awaiting hear back from her endocrinologist at [facility] regarding recommendations for methimazole dose.  2. Hyperthyroidism. Patient with history of hyperthyroidism currently on methimazole 10 mg by mouth daily. Initial lab work showing TSH of 0.135. It is possible hyperthyroidism may be contributing to patient's A. Fib. Awaiting hear back from her endocrinologist at [facility] regarding further recommendations, meanwhile will  follow-up on free T3 and free T4, continue methimazole 10 mg by mouth daily.  3. Type 2 diabetes mellitus. Will check a hemoglobin A1c, Accu-Cheks before every meal C daily at bedtime with sliding scale coverage. Continue Levemir 80 units subcutaneous daily and Glucotrol milligrams by mouth daily. Will hold metformin for now. 4. Hypertension. Patient currently on IV Cardizem. Will continue Tenoretic 50/25 one tablet by mouth daily 5. Dyslipidemia. Will check a fasting lipid panel for risk  stratification, continue Lipitor 40 mg by mouth daily 6. DVT prophylaxis. Patient fully anticoagulated with Lovenox   Code Status: Full code Family Communication:  Disposition Plan: Will admit patient to the inpatient service, despite may require greater than 2 nights hospitalization  Time spent: 70 min  Kelvin Cellar [facility] Pager [phone]

## 2014-02-14 NOTE — Progress Notes (Signed)
  Echocardiogram 2D Echocardiogram has been performed.  Darlina Sicilian M 02/14/2014, 3:02 PM

## 2014-02-14 NOTE — Progress Notes (Signed)
UR Completed Savon Bordonaro Graves-Bigelow, RN,BSN [phone]  

## 2014-02-14 NOTE — Progress Notes (Signed)
ANTICOAGULATION CONSULT NOTE - Initial Consult  Pharmacy Consult for Lovenox Indication: atrial fibrillation  No Known Allergies  Patient Measurements: Height: 5\' 1"  (154.9 cm) Weight: 170 lb (77.111 kg) IBW/kg (Calculated) : 47.8  Vital Signs: Temp: 98.3 F (36.8 C) (10/28 1112) Temp Source: Oral (10/28 1112) BP: 120/82 mmHg (10/28 1200) Pulse Rate: 90 (10/28 1200)  Labs:  Recent Labs  02/14/14 0715  HGB 12.9  HCT 39.6  PLT 306  CREATININE 0.78    Estimated Creatinine Clearance: 79.9 ml/min (by C-G formula based on Cr of 0.78).   Medical History: Past Medical History  Diagnosis Date  . Asthma   . Diabetes mellitus   . Hyperlipidemia   . Hypertension   . GERD (gastroesophageal reflux disease)   . Cerebellar tumor   . Obesity   . Sleep apnea     Medications:  Prescriptions prior to admission  Medication Sig Dispense Refill  . albuterol (PROVENTIL HFA;VENTOLIN HFA) 108 (90 BASE) MCG/ACT inhaler Inhale 2 puffs into the lungs every 4 (four) hours as needed for wheezing or shortness of breath.      Marland Kitchen aspirin 81 MG tablet Take 81 mg by mouth daily.      Marland Kitchen atenolol-chlorthalidone (TENORETIC) 50-25 MG per tablet Take 1 tablet by mouth daily.  30 tablet  5  . atorvastatin (LIPITOR) 40 MG tablet Take 40 mg by mouth daily.      Marland Kitchen azelastine (OPTIVAR) 0.05 % ophthalmic solution Place 1 drop into both eyes 2 (two) times daily as needed (for allergies).      . beclomethasone (QVAR) 80 MCG/ACT inhaler Inhale 1 puff into the lungs 2 (two) times daily as needed (asthma).      . cetirizine (ZYRTEC) 10 MG tablet Take 1 tablet (10 mg total) by mouth daily.  30 tablet  3  . glipiZIDE (GLUCOTROL) 10 MG tablet Take 10 mg by mouth daily before breakfast.      . insulin detemir (LEVEMIR FLEXPEN) 100 UNIT/ML injection Inject 80 Units into the skin every morning.      . IRON PO Take 1 tablet by mouth daily.      Marland Kitchen lisinopril (PRINIVIL,ZESTRIL) 5 MG tablet Take 5 mg by mouth daily.       . metFORMIN (GLUCOPHAGE) 1000 MG tablet Take 1,000 mg by mouth 2 (two) times daily with a meal.      . methimazole (TAPAZOLE) 10 MG tablet Take 10 mg by mouth daily.      . Multiple Vitamin (MULTIVITAMIN WITH MINERALS) TABS tablet Take 1 tablet by mouth daily.      . Omega-3 Fatty Acids (FISH OIL) 1000 MG CAPS Take 1,000 mg by mouth 3 (three) times daily.       . pantoprazole (PROTONIX) 40 MG tablet Take 40 mg by mouth daily.      . potassium chloride SA (K-DUR,KLOR-CON) 20 MEQ tablet Take 20 mEq by mouth daily as needed (for leg cramping). Take one tablet by mouth daily.      Marland Kitchen glucose blood test strip 1 each by Other route as needed. Onetouch ultra test strip         Assessment: 48 yo F presented to the ED with 2-3 day hx of not feeling well.  Pt reports generalized weakness, fatigue, nausea/vomitting.  EKG in the ED found Afib with RVR.  To initiate anticoagulation with Lovenox pending further work-up.  Goal of Therapy:  Anti-Xa level 0.6-1 units/ml 4hrs after LMWH dose given Monitor platelets by anticoagulation  protocol: Yes   Plan:  Lovenox 80 mg SQ q12h. Monitor SCr, CBC Follow-up plans for transition to oral anticoagulant.  [employer], Pharm.D., BCPS Clinical Pharmacist Pager [phone] 02/14/2014 1:27 PM

## 2014-02-14 NOTE — ED Notes (Signed)
Cardiology at bedside.

## 2014-02-15 ENCOUNTER — Telehealth: Payer: Self-pay | Admitting: *Deleted

## 2014-02-15 DIAGNOSIS — I4891 Unspecified atrial fibrillation: Principal | ICD-10-CM

## 2014-02-15 DIAGNOSIS — I10 Essential (primary) hypertension: Secondary | ICD-10-CM

## 2014-02-15 LAB — GLUCOSE, CAPILLARY
GLUCOSE-CAPILLARY: 150 mg/dL — AB (ref 70–99)
Glucose-Capillary: 161 mg/dL — ABNORMAL HIGH (ref 70–99)

## 2014-02-15 LAB — LIPID PANEL
CHOL/HDL RATIO: 5.3 ratio
Cholesterol: 216 mg/dL — ABNORMAL HIGH (ref 0–200)
HDL: 41 mg/dL (ref >39)
LDL CALC: 139 mg/dL — AB (ref 0–99)
Triglycerides: 182 mg/dL — ABNORMAL HIGH (ref <150)
VLDL: 36 mg/dL (ref 0–40)

## 2014-02-15 LAB — BASIC METABOLIC PANEL
Anion gap: 14 (ref 5–15)
BUN: 25 mg/dL — AB (ref 6–23)
CHLORIDE: 99 meq/L (ref 96–112)
CO2: 26 mEq/L (ref 19–32)
Calcium: 9.6 mg/dL (ref 8.4–10.5)
Creatinine, Ser: 0.79 mg/dL (ref 0.50–1.10)
GFR calc Af Amer: >90 mL/min (ref >90)
Glucose, Bld: 212 mg/dL — ABNORMAL HIGH (ref 70–99)
Potassium: 3.8 mEq/L (ref 3.7–5.3)
Sodium: 139 mEq/L (ref 137–147)

## 2014-02-15 LAB — CBC
HCT: 36.4 % (ref 36.0–46.0)
Hemoglobin: 11.8 g/dL — ABNORMAL LOW (ref 12.0–15.0)
MCH: 28.2 pg (ref 26.0–34.0)
MCHC: 32.4 g/dL (ref 30.0–36.0)
MCV: 87.1 fL (ref 78.0–100.0)
PLATELETS: 312 10*3/uL (ref 150–400)
RBC: 4.18 MIL/uL (ref 3.87–5.11)
RDW: 13.4 % (ref 11.5–15.5)
WBC: 6.5 10*3/uL (ref 4.0–10.5)

## 2014-02-15 LAB — TROPONIN I: Troponin I: <0.3 ng/mL (ref <0.30)

## 2014-02-15 MED ORDER — DILTIAZEM HCL 60 MG PO TABS
60.0000 mg | ORAL_TABLET | Freq: Three times a day (TID) | ORAL | Status: DC
  Administered 2014-02-15: 60 mg via ORAL
  Filled 2014-02-15: qty 1

## 2014-02-15 MED ORDER — RIVAROXABAN 20 MG PO TABS
20.0000 mg | ORAL_TABLET | Freq: Every day | ORAL | Status: DC
  Administered 2014-02-15: 20 mg via ORAL
  Filled 2014-02-15: qty 1

## 2014-02-15 MED ORDER — ATENOLOL 100 MG PO TABS: 100.0000 mg | ORAL_TABLET | Freq: Every day | ORAL | Status: DC

## 2014-02-15 MED ORDER — CHLORTHALIDONE 25 MG PO TABS
25.0000 mg | ORAL_TABLET | Freq: Every day | ORAL | Status: DC
  Administered 2014-02-15: 25 mg via ORAL
  Filled 2014-02-15: qty 1

## 2014-02-15 MED ORDER — RIVAROXABAN 20 MG PO TABS: 20.0000 mg | ORAL_TABLET | Freq: Every day | ORAL | Status: DC

## 2014-02-15 MED ORDER — ATENOLOL 25 MG PO TABS
100.0000 mg | ORAL_TABLET | Freq: Every day | ORAL | Status: DC
  Administered 2014-02-15: 100 mg via ORAL
  Filled 2014-02-15: qty 4

## 2014-02-15 NOTE — ED Provider Notes (Signed)
Medical screening examination/treatment/procedure(s) were conducted as a shared visit with non-physician practitioner(s) and myself.  I personally evaluated the patient during the encounter.   EKG Interpretation   Date/Time:  Wednesday February 14 2014 06:57:21 EDT Ventricular Rate:  144 PR Interval:    QRS Duration: 82 QT Interval:  268 QTC Calculation: 414 R Axis:   11 Text Interpretation:  Atrial fibrillation with rapid ventricular response  with premature ventricular or aberrantly conducted complexes Abnormal ECG  atrial fibrillation is new compared to Feb 2015 Confirmed by Zury Fazzino  MD,  Caelen Reierson ([id number]) on 02/14/2014 7:40:47 AM       Patient with new onset afib with dyspnea, anxiety and intermittent CP. HR controlled with diltiazem. Will admit to hospitalist.  Ephraim Hamburger, MD 02/15/14 [phone]

## 2014-02-15 NOTE — Telephone Encounter (Signed)
Hospital Follow up (30 min) scheduled for pt 02/21/2014 at 11am.  Pt was discharged 02/15/2014.

## 2014-02-15 NOTE — Progress Notes (Signed)
Pt discharged home with family.  Reviewed discharge instructions and education, all questions answered.  Assessment unchanged from earlier.  

## 2014-02-15 NOTE — Progress Notes (Signed)
ANTICOAGULATION CONSULT NOTE - Initial Consult  Pharmacy Consult for Rivaroxaban Indication: atrial fibrillation  No Known Allergies  Patient Measurements: Height: 5\' 1"  (154.9 cm) Weight: 178 lb 4.8 oz (80.876 kg) IBW/kg (Calculated) : 47.8 Heparin Dosing Weight:   Vital Signs: Temp: 98.4 F (36.9 C) (10/29 0348) Temp Source: Oral (10/29 0348) BP: 121/62 mmHg (10/29 0348) Pulse Rate: 77 (10/29 0348)  Labs:  Recent Labs  02/14/14 0715 02/14/14 1422 02/14/14 1955 02/15/14 0031  HGB 12.9 12.6  --  11.8*  HCT 39.6 39.0  --  36.4  PLT 306 326  --  312  CREATININE 0.78 0.68  --  0.79  TROPONINI  --  <0.30 <0.30 <0.30    Estimated Creatinine Clearance: 81.9 ml/min (by C-G formula based on Cr of 0.79).   Medical History: Past Medical History  Diagnosis Date  . Asthma   . Diabetes mellitus   . Hyperlipidemia   . Hypertension   . GERD (gastroesophageal reflux disease)   . Cerebellar tumor   . Obesity   . Sleep apnea     Medications:  Scheduled:  . atenolol  100 mg Oral Daily   And  . chlorthalidone  25 mg Oral Daily  . atorvastatin  40 mg Oral q1800  . glipiZIDE  10 mg Oral QAC breakfast  . insulin aspart  0-15 Units Subcutaneous TID WC  . insulin aspart  0-5 Units Subcutaneous QHS  . insulin detemir  80 Units Subcutaneous q morning - 10a  . methimazole  15 mg Oral Daily  . omega-3 acid ethyl esters  1 g Oral TID  . pantoprazole  40 mg Oral Daily  . sodium chloride  3 mL Intravenous Q12H    Assessment: 48yo female with new AFib/RVR.  She has been receiving Lovenox, now to change to Xarelto.  Cr < 1, Hg 12.9, pltc wnl, no bleeding problems noted.   MD had ordered Xarelto 20mg  daily this AM, then d/c'd and changed to per Rx.  Pt had already received first dose within this time; it had been scheduled appropriately based on last dose of Lovenox 10/28 PM.  Goal of Therapy:  prevention of stroke Monitor platelets by anticoagulation protocol: Yes   Plan:   Xarelto 20mg  daily with breakfast, next dose 10/30. Watch for s/s of bleeding Pt education  Gracy Bruins, [facility]

## 2014-02-15 NOTE — Discharge Instructions (Addendum)
Information on my medicine - XARELTO (Rivaroxaban)  This medication education was reviewed with me or my healthcare representative as part of my discharge preparation.  The pharmacist that spoke with me during my hospital stay was:  Jaquita Folds, [facility]  Why was Xarelto prescribed for you? Xarelto was prescribed for you to reduce the risk of a blood clot forming that can cause a stroke if you have a medical condition called atrial fibrillation (a type of irregular heartbeat).  What do you need to know about xarelto ? Take your Xarelto ONCE DAILY at the same time every day with your evening meal. If you have difficulty swallowing the tablet whole, you may crush it and mix in applesauce just prior to taking your dose.  Take Xarelto exactly as prescribed by your doctor and DO NOT stop taking Xarelto without talking to the doctor who prescribed the medication.  Stopping without other stroke prevention medication to take the place of Xarelto may increase your risk of developing a clot that causes a stroke.  Refill your prescription before you run out.  After discharge, you should have regular check-up appointments with your healthcare provider that is prescribing your Xarelto.  In the future your dose may need to be changed if your kidney function or weight changes by a significant amount.  What do you do if you miss a dose? If you are taking Xarelto ONCE DAILY and you miss a dose, take it as soon as you remember on the same day then continue your regularly scheduled once daily regimen the next day. Do not take two doses of Xarelto at the same time or on the same day.   Important Safety Information A possible side effect of Xarelto is bleeding. You should call your healthcare provider right away if you experience any of the following:   Bleeding from an injury or your nose that does not stop.   Unusual colored urine (red or dark brown) or unusual colored stools (red or black).   Unusual  bruising for unknown reasons.   A serious fall or if you hit your head (even if there is no bleeding).  Some medicines may interact with Xarelto and might increase your risk of bleeding while on Xarelto. To help avoid this, consult your healthcare provider or pharmacist prior to using any new prescription or non-prescription medications, including herbals, vitamins, non-steroidal anti-inflammatory drugs (NSAIDs) and supplements.  This website has more information on Xarelto: [email or web address]

## 2014-02-15 NOTE — Progress Notes (Addendum)
Patient Name: Crystal Peck Date of Encounter: 02/15/2014   Principal Problem:   Atrial fibrillation with RVR Active Problems:   Diabetes mellitus with no complication   HTN (hypertension)   GERD   OSA (obstructive sleep apnea)   Dyslipidemia    SUBJECTIVE  Crystal Peck is a 48 year old African American female who presented to the ED yesterday for complaints of nausea, vomiting, generalized weakness and not feeling herself since 02/09/14.  She was noted to be in atrial fibrillation with rapid ventricular response and was treated with Cardizem IV.  Last night she reports slight nausea, dizziness, and one episode of palpitations, but has not had any symptoms (chest pain, palpitations, shortness of breath, dizziness, nausea) since waking this morning.  She converted to sinus rhythm around 4am today and is currently NSR at a rate of 85.    CURRENT MEDS . atenolol  50 mg Oral Daily   And  . chlorthalidone  25 mg Oral Daily  . atorvastatin  40 mg Oral q1800  . enoxaparin (LOVENOX) injection  80 mg Subcutaneous Q12H  . glipiZIDE  10 mg Oral QAC breakfast  . insulin aspart  0-15 Units Subcutaneous TID WC  . insulin aspart  0-5 Units Subcutaneous QHS  . insulin detemir  80 Units Subcutaneous q morning - 10a  . methimazole  15 mg Oral Daily  . omega-3 acid ethyl esters  1 g Oral TID  . pantoprazole  40 mg Oral Daily  . sodium chloride  3 mL Intravenous Q12H    OBJECTIVE  Filed Vitals:   02/14/14 1200 02/14/14 1648 02/14/14 2032 02/15/14 0348  BP: 120/82 104/46 106/65 121/62  Pulse: 90  75 77  Temp:   98.7 F (37.1 C) 98.4 F (36.9 C)  TempSrc:   Oral Oral  Resp: 16  18 18   Height:    5\' 1"  (1.549 m)  Weight:    178 lb 4.8 oz (80.876 kg)  SpO2: 97%  99% 100%    Intake/Output Summary (Last 24 hours) at 02/15/14 0826 Last data filed at 02/14/14 2115  Gross per 24 hour  Intake    320 ml  Output      0 ml  Net    320 ml   Filed Weights   02/14/14 0647 02/15/14 0348    Weight: 170 lb (77.111 kg) 178 lb 4.8 oz (80.876 kg)    PHYSICAL EXAM  General: Pleasant, NAD. Neuro: Alert and oriented X 3. Moves all extremities spontaneously. Psych: Normal affect. HEENT:  Normal  Neck: Supple without bruits or JVD. Lungs:  Resp regular and unlabored, CTA. Heart: RRR no s3, s4, or murmurs. Abdomen: Soft, non-tender, non-distended, BS + x 4.  Extremities: No clubbing, cyanosis or edema. DP/PT/Radials 2+ and equal bilaterally.  Accessory Clinical Findings  CBC  Recent Labs  02/14/14 1422 02/15/14 0031  WBC 5.9 6.5  HGB 12.6 11.8*  HCT 39.0 36.4  MCV 87.6 87.1  PLT 326 017   Basic Metabolic Panel  Recent Labs  02/14/14 0715 02/14/14 1422 02/15/14 0031  NA 141  --  139  K 3.7  --  3.8  CL 102  --  99  CO2 26  --  26  GLUCOSE 217*  --  212*  BUN 22  --  25*  CREATININE 0.78 0.68 0.79  CALCIUM 9.8  --  9.6   Cardiac Enzymes  Recent Labs  02/14/14 1422 02/14/14 1955 02/15/14 0031  TROPONINI <0.30 <0.30 <0.30  Hemoglobin A1C  Recent Labs  02/14/14 1422  HGBA1C 9.0*   Fasting Lipid Panel  Recent Labs  02/15/14 0031  CHOL 216*  HDL 41  LDLCALC 139*  TRIG 182*  CHOLHDL 5.3   Thyroid Function Tests  Recent Labs  02/14/14 0715 02/14/14 1422  TSH 0.135*  --   T3FREE  --  3.7   TELE  NSR, rate 90.  ECG  0356 this morning reveals normal sinus rhythm with a rate of 77 and left ventricular hypertrophy.  Radiology/Studies  Dg Chest 2 View  02/14/2014   CLINICAL DATA:  Acute onset chest pain with difficulty breathing  EXAM: CHEST  2 VIEW  COMPARISON:  June 01, 2013  FINDINGS: There is no edema or consolidation. Heart is upper normal in size with pulmonary vascularity within normal limits. No adenopathy. No pneumothorax. No bone lesions. There are surgical clips in the right upper quadrant of the abdomen.  IMPRESSION: No edema or consolidation.   Electronically Signed   By: Lowella Grip M.D.   On: 02/14/2014  07:27  _____________  2D Echocardiogram Study Conclusions  - Left ventricle: The cavity size was normal. Wall thickness was increased in a pattern of mild LVH. Systolic function was normal. The estimated ejection fraction was in the range of 55% to 60%. Wall motion was normal; there were no regional wall motion abnormalities. - Pulmonary arteries: PA peak pressure: 35 mm Hg (S). - Pericardium, extracardiac: A trivial pericardial effusion was identified. _____________   ASSESSMENT AND PLAN  1. Atrial Fibrillation with RVR: Converted to NSR overnight around 4am, ECG showed NSR with a rate of 77. She has no complaints of chest pain, palpitations, nausea, shortness of breath, dizziness this morning and is overall feeling better.  BP stable at 121/62 and HR 85.  CHA2DS2-VASc score of 3 (hypertension, diabetes, female), she is receiving subcutaneous lovenox inpatient, and has low HAS BLED score of 1. Upon discharge, start xarelto 20mg  daily and titrate atenolol to 100mg  daily. In the absence of CAD and due to imitation of anticoagulation therapy, will discontinue aspirin 325mg .  2. Thyroid disease: Free T4 1.11, Free T3 3.7, TSH 0.135. TSH is suppressed with normal Free T 3 and T 4 levels. She will need to follow up with her endocrinologist for further evaluation.  3. Asthma: Stable. No shortness of breath, dyspnea upon exertion, or wheezing.  4. Hypertension: Stable. BP 121/62. Continue current medications.  5. Hypercholesteremia: TC: 216, LDL: 139 which is decreased from 08/2013. Continue Lipitor 40mg . Discussed importance of diet and exercise.  6. Diabetes: A1C 9.0 02/14/2014 which is decreased from 10.0 in 08/2013. Continue current medications with healty diet and exercise.   Signed, Murray Hodgkins NP  Patient seen and personally examined and agree with note as outlined by Ignacia Bayley, NP.  She has converted to NSR.  Discussed risks and benefits of warfarin vs. NOAC agents and she  wants to proceed with NOAC.  Will get Pharmacy to transition to Xarelto from Lovenox.  Stop cardizem and increase dose of Toprol.    OK to d/c home from cardiac standpoint with early followup with PA in our office.  Will sign off.  Signed: Fransico Him, MD [facility] 02/15/2014

## 2014-02-15 NOTE — Discharge Summary (Signed)
Physician Discharge Summary  Crystal Peck MRN: [id number] DOB/AGE: Mar 15, 1966 48 y.o.  PCP: [name], MD   Admit date: 02/14/2014 Discharge date: 02/15/2014  Discharge Diagnoses:     Atrial fibrillation with RVR Hyperthyroidism:   GERD   OSA (obstructive sleep apnea)   Dyslipidemia   Diabetes mellitus with no complication   HTN (hypertension)    Follow-up recommendations Follow-up with PCP in 5-7 days and follow-up with endocrinology    Medication List    STOP taking these medications       aspirin 81 MG tablet     atenolol-chlorthalidone 50-25 MG per tablet  Commonly known as:  TENORETIC      TAKE these medications       albuterol 108 (90 BASE) MCG/ACT inhaler  Commonly known as:  PROVENTIL HFA;VENTOLIN HFA  Inhale 2 puffs into the lungs every 4 (four) hours as needed for wheezing or shortness of breath.     atenolol 100 MG tablet  Commonly known as:  TENORMIN  Take 1 tablet (100 mg total) by mouth daily.     atorvastatin 40 MG tablet  Commonly known as:  LIPITOR  Take 40 mg by mouth daily.     azelastine 0.05 % ophthalmic solution  Commonly known as:  OPTIVAR  Place 1 drop into both eyes 2 (two) times daily as needed (for allergies).     beclomethasone 80 MCG/ACT inhaler  Commonly known as:  QVAR  Inhale 1 puff into the lungs 2 (two) times daily as needed (asthma).     cetirizine 10 MG tablet  Commonly known as:  ZYRTEC  Take 1 tablet (10 mg total) by mouth daily.     Fish Oil 1000 MG Caps  Take 1,000 mg by mouth 3 (three) times daily.     glipiZIDE 10 MG tablet  Commonly known as:  GLUCOTROL  Take 10 mg by mouth daily before breakfast.     glucose blood test strip  1 each by Other route as needed. Onetouch ultra test strip     IRON PO  Take 1 tablet by mouth daily.     LEVEMIR FLEXPEN 100 UNIT/ML injection  Generic drug:  insulin detemir  Inject 80 Units into the skin every morning.     lisinopril 5 MG tablet  Commonly  known as:  PRINIVIL,ZESTRIL  Take 5 mg by mouth daily.     metFORMIN 1000 MG tablet  Commonly known as:  GLUCOPHAGE  Take 1,000 mg by mouth 2 (two) times daily with a meal.     methimazole 10 MG tablet  Commonly known as:  TAPAZOLE  Take 10 mg by mouth daily.     multivitamin with minerals Tabs tablet  Take 1 tablet by mouth daily.     pantoprazole 40 MG tablet  Commonly known as:  PROTONIX  Take 40 mg by mouth daily.     potassium chloride SA 20 MEQ tablet  Commonly known as:  K-DUR,KLOR-CON  Take 20 mEq by mouth daily as needed (for leg cramping). Take one tablet by mouth daily.     rivaroxaban 20 MG Tabs tablet  Commonly known as:  XARELTO  Take 1 tablet (20 mg total) by mouth daily with supper.        Discharge Condition: Stable Disposition: 01-Home or Self Care   Consults: Cardiology  Significant Diagnostic Studies: Dg Chest 2 View  02/14/2014   CLINICAL DATA:  Acute onset chest pain with difficulty breathing  EXAM: CHEST  2 VIEW  COMPARISON:  June 01, 2013  FINDINGS: There is no edema or consolidation. Heart is upper normal in size with pulmonary vascularity within normal limits. No adenopathy. No pneumothorax. No bone lesions. There are surgical clips in the right upper quadrant of the abdomen.  IMPRESSION: No edema or consolidation.   Electronically Signed   By: Lowella Grip M.D.   On: 02/14/2014 07:27       Microbiology: Recent Results (from the past 240 hour(s))  MRSA PCR SCREENING     Status: None   Collection Time    02/14/14  3:03 PM      Result Value Ref Range Status   MRSA by PCR NEGATIVE  NEGATIVE Final   Comment:            The GeneXpert MRSA Assay (FDA     approved for NASAL specimens     only), is one component of a     comprehensive MRSA colonization     surveillance program. It is not     intended to diagnose MRSA     infection nor to guide or     monitor treatment for     MRSA infections.     Labs: Results for orders  placed during the hospital encounter of 02/14/14 (from the past 48 hour(s))  CBC     Status: None   Collection Time    02/14/14  7:15 AM      Result Value Ref Range   WBC 5.7  4.0 - 10.5 K/uL   RBC 4.52  3.87 - 5.11 MIL/uL   Hemoglobin 12.9  12.0 - 15.0 g/dL   HCT 39.6  36.0 - 46.0 %   MCV 87.6  78.0 - 100.0 fL   MCH 28.5  26.0 - 34.0 pg   MCHC 32.6  30.0 - 36.0 g/dL   RDW 13.3  11.5 - 15.5 %   Platelets 306  150 - 400 K/uL  BASIC METABOLIC PANEL     Status: Abnormal   Collection Time    02/14/14  7:15 AM      Result Value Ref Range   Sodium 141  137 - 147 mEq/L   Potassium 3.7  3.7 - 5.3 mEq/L   Chloride 102  96 - 112 mEq/L   CO2 26  19 - 32 mEq/L   Glucose, Bld 217 (*) 70 - 99 mg/dL   BUN 22  6 - 23 mg/dL   Creatinine, Ser 0.78  0.50 - 1.10 mg/dL   Calcium 9.8  8.4 - 10.5 mg/dL   GFR calc non Af Amer >90  >90 mL/min   GFR calc Af Amer >90  >90 mL/min   Comment: (NOTE)     The eGFR has been calculated using the CKD EPI equation.     This calculation has not been validated in all clinical situations.     eGFR's persistently <90 mL/min signify possible Chronic Kidney     Disease.   Anion gap 13  5 - 15  TSH     Status: Abnormal   Collection Time    02/14/14  7:15 AM      Result Value Ref Range   TSH 0.135 (*) 0.350 - 4.500 uIU/mL  T4, FREE     Status: None   Collection Time    02/14/14  7:15 AM      Result Value Ref Range   Free T4 1.11  0.80 - 1.80 ng/dL   Comment: Performed at  [facility]  I-STAT [place], ED     Status: None   Collection Time    02/14/14  7:35 AM      Result Value Ref Range   Troponin i, poc 0.01  0.00 - 0.08 ng/mL   Comment 3            Comment: Due to the release kinetics of cTnI,     a negative result within the first hours     of the onset of symptoms does not rule out     myocardial infarction with certainty.     If myocardial infarction is still suspected,     repeat the test at appropriate intervals.  GLUCOSE, CAPILLARY      Status: Abnormal   Collection Time    02/14/14  1:06 PM      Result Value Ref Range   Glucose-Capillary 135 (*) 70 - 99 mg/dL  HEMOGLOBIN A1C     Status: Abnormal   Collection Time    02/14/14  2:22 PM      Result Value Ref Range   Hemoglobin A1C 9.0 (*) <5.7 %   Comment: (NOTE)                                                                               According to the ADA Clinical Practice Recommendations for 2011, when     HbA1c is used as a screening test:      >=6.5%   Diagnostic of Diabetes Mellitus               (if abnormal result is confirmed)     5.7-6.4%   Increased risk of developing Diabetes Mellitus     References:Diagnosis and Classification of Diabetes Mellitus,Diabetes     IEPP,2951,88(CZYSA 1):S62-S69 and Standards of Medical Care in             Diabetes - 2011,Diabetes Care,2011,34 (Suppl 1):S11-S61.   Mean Plasma Glucose 212 (*) <117 mg/dL   Comment: Performed at [facility]  CBC     Status: None   Collection Time    02/14/14  2:22 PM      Result Value Ref Range   WBC 5.9  4.0 - 10.5 K/uL   RBC 4.45  3.87 - 5.11 MIL/uL   Hemoglobin 12.6  12.0 - 15.0 g/dL   HCT 39.0  36.0 - 46.0 %   MCV 87.6  78.0 - 100.0 fL   MCH 28.3  26.0 - 34.0 pg   MCHC 32.3  30.0 - 36.0 g/dL   RDW 13.4  11.5 - 15.5 %   Platelets 326  150 - 400 K/uL  CREATININE, SERUM     Status: None   Collection Time    02/14/14  2:22 PM      Result Value Ref Range   Creatinine, Ser 0.68  0.50 - 1.10 mg/dL   GFR calc non Af Amer >90  >90 mL/min   GFR calc Af Amer >90  >90 mL/min   Comment: (NOTE)     The eGFR has been calculated using the CKD EPI equation.     This calculation has not been validated in all clinical situations.  eGFR's persistently <90 mL/min signify possible Chronic Kidney     Disease.  TROPONIN I     Status: None   Collection Time    02/14/14  2:22 PM      Result Value Ref Range   Troponin I <0.30  <0.30 ng/mL   Comment:            Due to the release  kinetics of cTnI,     a negative result within the first hours     of the onset of symptoms does not rule out     myocardial infarction with certainty.     If myocardial infarction is still suspected,     repeat the test at appropriate intervals.  T3, FREE     Status: None   Collection Time    02/14/14  2:22 PM      Result Value Ref Range   T3, Free 3.7  2.3 - 4.2 pg/mL   Comment: Performed at [place] PCR SCREENING     Status: None   Collection Time    02/14/14  3:03 PM      Result Value Ref Range   MRSA by PCR NEGATIVE  NEGATIVE   Comment:            The GeneXpert MRSA Assay (FDA     approved for NASAL specimens     only), is one component of a     comprehensive MRSA colonization     surveillance program. It is not     intended to diagnose MRSA     infection nor to guide or     monitor treatment for     MRSA infections.  GLUCOSE, CAPILLARY     Status: Abnormal   Collection Time    02/14/14  4:31 PM      Result Value Ref Range   Glucose-Capillary 209 (*) 70 - 99 mg/dL   Comment 1 Documented in Chart     Comment 2 Notify RN    TROPONIN I     Status: None   Collection Time    02/14/14  7:55 PM      Result Value Ref Range   Troponin I <0.30  <0.30 ng/mL   Comment:            Due to the release kinetics of cTnI,     a negative result within the first hours     of the onset of symptoms does not rule out     myocardial infarction with certainty.     If myocardial infarction is still suspected,     repeat the test at appropriate intervals.  GLUCOSE, CAPILLARY     Status: Abnormal   Collection Time    02/14/14  9:05 PM      Result Value Ref Range   Glucose-Capillary 201 (*) 70 - 99 mg/dL  TROPONIN I     Status: None   Collection Time    02/15/14 12:31 AM      Result Value Ref Range   Troponin I <0.30  <0.30 ng/mL   Comment:            Due to the release kinetics of cTnI,     a negative result within the first hours     of the onset of symptoms  does not rule out     myocardial infarction with certainty.     If myocardial infarction is still suspected,     repeat  the test at appropriate intervals.  BASIC METABOLIC PANEL     Status: Abnormal   Collection Time    02/15/14 12:31 AM      Result Value Ref Range   Sodium 139  137 - 147 mEq/L   Potassium 3.8  3.7 - 5.3 mEq/L   Chloride 99  96 - 112 mEq/L   CO2 26  19 - 32 mEq/L   Glucose, Bld 212 (*) 70 - 99 mg/dL   BUN 25 (*) 6 - 23 mg/dL   Creatinine, Ser 0.79  0.50 - 1.10 mg/dL   Calcium 9.6  8.4 - 10.5 mg/dL   GFR calc non Af Amer >90  >90 mL/min   GFR calc Af Amer >90  >90 mL/min   Comment: (NOTE)     The eGFR has been calculated using the CKD EPI equation.     This calculation has not been validated in all clinical situations.     eGFR's persistently <90 mL/min signify possible Chronic Kidney     Disease.   Anion gap 14  5 - 15  CBC     Status: Abnormal   Collection Time    02/15/14 12:31 AM      Result Value Ref Range   WBC 6.5  4.0 - 10.5 K/uL   RBC 4.18  3.87 - 5.11 MIL/uL   Hemoglobin 11.8 (*) 12.0 - 15.0 g/dL   HCT 36.4  36.0 - 46.0 %   MCV 87.1  78.0 - 100.0 fL   MCH 28.2  26.0 - 34.0 pg   MCHC 32.4  30.0 - 36.0 g/dL   RDW 13.4  11.5 - 15.5 %   Platelets 312  150 - 400 K/uL  LIPID PANEL     Status: Abnormal   Collection Time    02/15/14 12:31 AM      Result Value Ref Range   Cholesterol 216 (*) 0 - 200 mg/dL   Triglycerides 182 (*) <150 mg/dL   HDL 41  >39 mg/dL   Total CHOL/HDL Ratio 5.3     VLDL 36  0 - 40 mg/dL   LDL Cholesterol 139 (*) 0 - 99 mg/dL   Comment:            Total Cholesterol/HDL:CHD Risk     Coronary Heart Disease Risk Table                         Men   Women      1/2 Average Risk   3.4   3.3      Average Risk       5.0   4.4      2 X Average Risk   9.6   7.1      3 X Average Risk  23.4   11.0                Use the calculated Patient Ratio     above and the CHD Risk Table     to determine the patient's CHD Risk.                 ATP III CLASSIFICATION (LDL):      <100     mg/dL   Optimal      100-129  mg/dL   Near or Above                        Optimal  130-159  mg/dL   Borderline      160-189  mg/dL   High      >190     mg/dL   Very High  GLUCOSE, CAPILLARY     Status: Abnormal   Collection Time    02/15/14  7:51 AM      Result Value Ref Range   Glucose-Capillary 161 (*) 70 - 99 mg/dL     HPI  48 year old woman is an employee of [employer] and works in [employer] as a Scientist, water quality. She does not have any history of prior heart disease. She does have a history of asthma, diabetes mellitus, hyperlipidemia, hypertension, and GERD. She also has a history of sleep apnea. She has not felt well since last Friday 5 days ago. At that time she had nausea but did not vomit. 3 days ago on Sunday she was nauseated and vomited once. Last night she awoke from sleep. She was wearing her CPAP machine. She noted that she was nauseated and she vomited once. This morning she was also weak and short of breath while climbing up stairs.  She has a history of overactive thyroid and has been on methimazole. She states that she on her own doubled up on her dose of methimazole for the last 2 nights.  She has noted only vague chest discomfort without radiation. She cannot be sure when her irregular pulse started. She thinks that it may have been present on Friday when she felt nauseated.  In the emergency room her EKG shows no acute ischemic changes. Her chest x-ray shows no evidence of congestive heart failure. The heart is upper normal in size. Her initial point of care troponin is normal. Her TSH is suppressed. Free T4 and T3 are pending.  She denies any history of GI bleeding.   HOSPITAL COURSE:   1. Atrial Fibrillation with RVR: Converted to NSR overnight around 4am on Cardizem drip, ECG showed NSR with a rate of 77. She has no complaints of chest pain, palpitations, nausea, shortness of breath, dizziness this morning and is  overall feeling better. BP stable at 121/62 and HR 85. CHA2DS2-VASc score of 3 (hypertension, diabetes, female), she was initiated on lovenox inpatient, and has low HAS BLED score of 1. Upon discharge, started on  xarelto 82m daily and titrate atenolol to 1057mdaily. In the absence of CAD and due to imitation of anticoagulation therapy, will discontinue aspirin . 2. Thyroid disease: Free T4 1.11, Free T3 3.7, TSH 0.135. TSH is suppressed with normal Free T 3 and T 4 levels. She will need to follow up with her endocrinologist for further evaluation.  3. Asthma: Stable. No shortness of breath, dyspnea upon exertion, or wheezing.  4. Hypertension: Stable. BP 121/62. Continue current medications.  5. Hypercholesteremia: TC: 216, LDL: 139 which is decreased from 08/2013. Continue Lipitor 4050mDiscussed importance of diet and exercise.  6. Diabetes: A1C 9.0 02/14/2014 which is decreased from 10.0 in 08/2013. Continue current medications with healty diet and exercise    Discharge Exam:  Blood pressure 121/62, pulse 77, temperature 98.4 F (36.9 C), temperature source Oral, resp. rate 18, height '5\' 1"'  (1.549 m), weight 80.876 kg (178 lb 4.8 oz), SpO2 100.00%. General: Pleasant, NAD.  Neuro: Alert and oriented X 3. Moves all extremities spontaneously.  Psych: Normal affect.  HEENT: Normal  Neck: Supple without bruits or JVD.  Lungs: Resp regular and unlabored, CTA.  Heart: RRR no s3, s4, or murmurs.  Abdomen: Soft, non-tender,  non-distended, BS + x 4.  Extremities: No clubbing, cyanosis or edema. DP/PT/Radials 2+ and equal bilaterally         Discharge Instructions   Diet - low sodium heart healthy    Complete by:  As directed      Increase activity slowly    Complete by:  As directed            Follow-up Information   Follow up with [name], MD. Schedule an appointment as soon as possible for a visit in 1 week.   Specialty:  Family Medicine   Contact information:   [place] [place] [phone]       Schedule an appointment as soon as possible for a visit with Follow with endocrinology.      SignedReyne Dumas 02/15/2014, 11:47 AM

## 2014-02-16 NOTE — Telephone Encounter (Signed)
Admit date: 02/14/2014  Discharge date: 02/15/2014  Reason for admission:  Atrial Fibrillation with PVR  Transition Care Management Follow-up Telephone Call  How have you been since you were released from the hospital? Pt states that she is doing good and seems to be improving each day.  She states that she does feel slightly weak, but this is decreasing as she increases activity.  She denies palpitations, chest pain, shortness of breath, and dizziness.  She has started the atenolol 100 mg and Xarelto 20 mg (denies significant episodes of bleeding).     Do you understand why you were in the hospital? yes   Do you understand the discharge instructions? yes  Items Reviewed:  Medications reviewed: yes  Allergies reviewed: yes  Dietary changes reviewed: yes, low sodium heart healthy  Referrals reviewed: yes, pt was reminded to call her endocrinologist to schedule an appointment.  Pt agreed that she would call to schedule.    Functional Questionnaire:   Activities of Daily Living (ADLs):   She states they are independent in the following:  ambulation, bathing and hygiene, feeding, continence, grooming, toileting and dressing States they require assistance with the following: none   Any transportation issues/concerns?: no   Any patient concerns? no   Confirmed importance and date/time of follow-up visits scheduled: yes   Confirmed with patient if condition begins to worsen call PCP or go to the ER.  Patient was given the Call-a-Nurse line [phone]: yes  Hospital follow up scheduled on 02/21/14 @ 1100 am with Dr. Birdie Riddle.

## 2014-02-19 ENCOUNTER — Telehealth: Payer: Self-pay | Admitting: *Deleted

## 2014-02-19 NOTE — Telephone Encounter (Signed)
FMLA paperwork received via fax from [employer]. Forms filled out as much as possible and forwarded to Dr. Birdie Riddle. Hospital f/u appt scheduled for 02/21/2014. JG//CMA

## 2014-02-21 ENCOUNTER — Encounter: Payer: Self-pay | Admitting: Family Medicine

## 2014-02-21 ENCOUNTER — Ambulatory Visit (INDEPENDENT_AMBULATORY_CARE_PROVIDER_SITE_OTHER): Payer: 59 | Admitting: Family Medicine

## 2014-02-21 VITALS — BP 110/80 | HR 72 | Temp 98.1°F | Resp 16 | Wt 182.0 lb

## 2014-02-21 DIAGNOSIS — I4891 Unspecified atrial fibrillation: Secondary | ICD-10-CM

## 2014-02-21 DIAGNOSIS — E0590 Thyrotoxicosis, unspecified without thyrotoxic crisis or storm: Secondary | ICD-10-CM

## 2014-02-21 DIAGNOSIS — J069 Acute upper respiratory infection, unspecified: Secondary | ICD-10-CM

## 2014-02-21 MED ORDER — BENZONATATE 200 MG PO CAPS: 200.0000 mg | ORAL_CAPSULE | Freq: Three times a day (TID) | ORAL | Status: DC | PRN

## 2014-02-21 NOTE — Assessment & Plan Note (Signed)
New to provider, cause of recent hospitalization.  Likely due to uncontrolled hyperthyroid.  Pt does not have f/u scheduled w/ cards- referral entered.  Atenolol dose now 100mg  and on Xarelto.  Pt rate controlled and in apparent sinus rhythm today.  Will follow.

## 2014-02-21 NOTE — Patient Instructions (Signed)
Please schedule a follow up appt with your Endocrinologist to adjust your thyroid medication We'll call you with your Cardiology appt Drink plenty of fluids and rest to recover from your viral illness Use the Tessalon as needed for cough Call with any questions or concerns Hang in there!!!

## 2014-02-21 NOTE — Assessment & Plan Note (Signed)
New.  Pt w/o evidence of bacterial infxn.  Suspect viral illness contracted during recent hospitalization.  Cough meds prn.  Reviewed supportive care and red flags that should prompt return.  Pt expressed understanding and is in agreement w/ plan.

## 2014-02-21 NOTE — Progress Notes (Signed)
   Subjective:    Patient ID: Crystal Peck, female    DOB: 12-31-1965, 48 y.o.   MRN: [id number]  [facility] f/u- pt was admitted on 10/28-29 for Afib w/ RVR.  Pt was found to be hyperthyroid and was to f/u w/ Endo to have Methimazole increased.  Pt now on Xarelto and atenolol was increased to 100mg .  Pt has not scheduled appt w/ Endo (Autumn Ronnald Ramp, [facility]).  Pt doesn't have Cards f/u scheduled.  Denies CP, SOB, palpitations.  Pt has been out of work yesterday and today due to URI.  + cough, nasal congestion, PND   Review of Systems For ROS see HPI     Objective:   Physical Exam  Constitutional: She is oriented to person, place, and time. She appears well-developed and well-nourished. No distress.  HENT:  Head: Normocephalic and atraumatic.  Right Ear: Tympanic membrane normal.  Left Ear: Tympanic membrane normal.  Nose: Mucosal edema and rhinorrhea present. Right sinus exhibits no maxillary sinus tenderness and no frontal sinus tenderness. Left sinus exhibits no maxillary sinus tenderness and no frontal sinus tenderness.  Mouth/Throat: Mucous membranes are normal. Posterior oropharyngeal erythema (w/ PND) present.  Eyes: Conjunctivae and EOM are normal. Pupils are equal, round, and reactive to light.  Neck: Normal range of motion. Neck supple.  Cardiovascular: Normal rate, regular rhythm, normal heart sounds and intact distal pulses.   Pulmonary/Chest: Effort normal and breath sounds normal. No respiratory distress. She has no wheezes. She has no rales.  Lymphadenopathy:    She has no cervical adenopathy.  Neurological: She is alert and oriented to person, place, and time.  Skin: Skin is warm and dry.  Psychiatric: She has a normal mood and affect. Her behavior is normal. Thought content normal.  Vitals reviewed.         Assessment & Plan:

## 2014-02-21 NOTE — Assessment & Plan Note (Signed)
New.  Pt has been following w/ Endo.  Was supposed to schedule an appt s/p d/c but she has not done this yet.  Stressed need to schedule appt as this is likely the cause of her afib and her meds likely need to be adjusted.  Pt expressed understanding.

## 2014-02-21 NOTE — Progress Notes (Signed)
Pre visit review using our clinic review tool, if applicable. No additional management support is needed unless otherwise documented below in the visit note. 

## 2014-02-22 DIAGNOSIS — Z7689 Persons encountering health services in other specified circumstances: Secondary | ICD-10-CM

## 2014-02-26 NOTE — Telephone Encounter (Signed)
Forms faxed to [employer]. JG//CMA

## 2014-02-27 LAB — BASIC METABOLIC PANEL
BUN: 19 mg/dL (ref 4–21)
CREATININE: 0.9 mg/dL (ref <=1.1)
Glucose: 112 mg/dL
Potassium: 4.6 mmol/L (ref 3.4–5.3)
SODIUM: 147 mmol/L (ref 137–147)

## 2014-02-27 LAB — HEMOGLOBIN A1C: Hgb A1c MFr Bld: 8 % — AB (ref 4.0–6.0)

## 2014-02-27 LAB — HEPATIC FUNCTION PANEL
ALT: 28 U/L (ref 7–35)
AST: 17 U/L (ref 13–35)
Alkaline Phosphatase: 92 U/L (ref 25–125)
Bilirubin, Total: 0.3 mg/dL

## 2014-03-09 ENCOUNTER — Encounter: Payer: 59 | Admitting: Family Medicine

## 2014-03-13 ENCOUNTER — Encounter: Payer: Self-pay | Admitting: General Practice

## 2014-03-16 DIAGNOSIS — I4891 Unspecified atrial fibrillation: Secondary | ICD-10-CM

## 2014-03-16 DIAGNOSIS — E0590 Thyrotoxicosis, unspecified without thyrotoxic crisis or storm: Secondary | ICD-10-CM

## 2014-03-16 DIAGNOSIS — J069 Acute upper respiratory infection, unspecified: Secondary | ICD-10-CM

## 2014-03-27 ENCOUNTER — Observation Stay (HOSPITAL_COMMUNITY)
Admission: AD | Admit: 2014-03-27 | Discharge: 2014-03-28 | Disposition: A | Discharge status: Home / Self Care | Payer: 59 | Source: Ambulatory Visit | Attending: Cardiology | Admitting: Cardiology

## 2014-03-27 ENCOUNTER — Encounter (HOSPITAL_COMMUNITY): Payer: Self-pay | Admitting: General Practice

## 2014-03-27 ENCOUNTER — Other Ambulatory Visit: Payer: Self-pay | Admitting: Cardiology

## 2014-03-27 ENCOUNTER — Encounter: Payer: Self-pay | Admitting: Cardiology

## 2014-03-27 ENCOUNTER — Ambulatory Visit (INDEPENDENT_AMBULATORY_CARE_PROVIDER_SITE_OTHER): Payer: 59 | Admitting: Cardiology

## 2014-03-27 VITALS — BP 124/64 | HR 52 | Ht 61.0 in | Wt 183.0 lb

## 2014-03-27 DIAGNOSIS — E079 Disorder of thyroid, unspecified: Secondary | ICD-10-CM | POA: Diagnosis not present

## 2014-03-27 DIAGNOSIS — D432 Neoplasm of uncertain behavior of brain, unspecified: Secondary | ICD-10-CM | POA: Diagnosis not present

## 2014-03-27 DIAGNOSIS — I10 Essential (primary) hypertension: Secondary | ICD-10-CM

## 2014-03-27 DIAGNOSIS — E785 Hyperlipidemia, unspecified: Secondary | ICD-10-CM | POA: Diagnosis not present

## 2014-03-27 DIAGNOSIS — J45909 Unspecified asthma, uncomplicated: Secondary | ICD-10-CM | POA: Insufficient documentation

## 2014-03-27 DIAGNOSIS — E0590 Thyrotoxicosis, unspecified without thyrotoxic crisis or storm: Secondary | ICD-10-CM | POA: Diagnosis present

## 2014-03-27 DIAGNOSIS — K219 Gastro-esophageal reflux disease without esophagitis: Secondary | ICD-10-CM | POA: Diagnosis present

## 2014-03-27 DIAGNOSIS — G4730 Sleep apnea, unspecified: Secondary | ICD-10-CM | POA: Insufficient documentation

## 2014-03-27 DIAGNOSIS — R079 Chest pain, unspecified: Secondary | ICD-10-CM | POA: Diagnosis present

## 2014-03-27 DIAGNOSIS — E119 Type 2 diabetes mellitus without complications: Secondary | ICD-10-CM | POA: Diagnosis not present

## 2014-03-27 DIAGNOSIS — I200 Unstable angina: Secondary | ICD-10-CM

## 2014-03-27 DIAGNOSIS — I4891 Unspecified atrial fibrillation: Secondary | ICD-10-CM | POA: Insufficient documentation

## 2014-03-27 DIAGNOSIS — I209 Angina pectoris, unspecified: Secondary | ICD-10-CM | POA: Diagnosis not present

## 2014-03-27 DIAGNOSIS — E669 Obesity, unspecified: Secondary | ICD-10-CM | POA: Diagnosis not present

## 2014-03-27 DIAGNOSIS — I480 Paroxysmal atrial fibrillation: Secondary | ICD-10-CM

## 2014-03-27 HISTORY — DX: Chest pain, unspecified: R07.9

## 2014-03-27 LAB — COMPREHENSIVE METABOLIC PANEL
ALT: 47 U/L — AB (ref 0–35)
AST: 28 U/L (ref 0–37)
Albumin: 3.4 g/dL — ABNORMAL LOW (ref 3.5–5.2)
Alkaline Phosphatase: 95 U/L (ref 39–117)
Anion gap: 14 (ref 5–15)
BUN: 16 mg/dL (ref 6–23)
CALCIUM: 9.2 mg/dL (ref 8.4–10.5)
CO2: 22 meq/L (ref 19–32)
Chloride: 105 mEq/L (ref 96–112)
Creatinine, Ser: 0.63 mg/dL (ref 0.50–1.10)
GFR calc non Af Amer: >90 mL/min (ref >90)
Glucose, Bld: 97 mg/dL (ref 70–99)
Potassium: 3.6 mEq/L — ABNORMAL LOW (ref 3.7–5.3)
SODIUM: 141 meq/L (ref 137–147)
TOTAL PROTEIN: 6.9 g/dL (ref 6.0–8.3)
Total Bilirubin: 0.6 mg/dL (ref 0.3–1.2)

## 2014-03-27 LAB — CBC WITH DIFFERENTIAL/PLATELET
BASOS ABS: 0.1 10*3/uL (ref 0.0–0.1)
BASOS PCT: 1 % (ref 0–1)
EOS ABS: 0.1 10*3/uL (ref 0.0–0.7)
Eosinophils Relative: 2 % (ref 0–5)
HEMATOCRIT: 35 % — AB (ref 36.0–46.0)
Hemoglobin: 11.3 g/dL — ABNORMAL LOW (ref 12.0–15.0)
Lymphocytes Relative: 44 % (ref 12–46)
Lymphs Abs: 2.4 10*3/uL (ref 0.7–4.0)
MCH: 28.3 pg (ref 26.0–34.0)
MCHC: 32.3 g/dL (ref 30.0–36.0)
MCV: 87.7 fL (ref 78.0–100.0)
Monocytes Absolute: 0.3 10*3/uL (ref 0.1–1.0)
Monocytes Relative: 5 % (ref 3–12)
Neutro Abs: 2.5 10*3/uL (ref 1.7–7.7)
Neutrophils Relative %: 48 % (ref 43–77)
PLATELETS: 306 10*3/uL (ref 150–400)
RBC: 3.99 MIL/uL (ref 3.87–5.11)
RDW: 13.3 % (ref 11.5–15.5)
WBC: 5.3 10*3/uL (ref 4.0–10.5)

## 2014-03-27 LAB — TROPONIN I: Troponin I: <0.3 ng/mL (ref <0.30)

## 2014-03-27 LAB — GLUCOSE, CAPILLARY
GLUCOSE-CAPILLARY: 148 mg/dL — AB (ref 70–99)
GLUCOSE-CAPILLARY: 113 mg/dL — AB (ref 70–99)

## 2014-03-27 LAB — MAGNESIUM: MAGNESIUM: 2.1 mg/dL (ref 1.5–2.5)

## 2014-03-27 LAB — TSH: TSH: 0.771 u[IU]/mL (ref 0.350–4.500)

## 2014-03-27 LAB — APTT: aPTT: 35 seconds (ref 24–37)

## 2014-03-27 LAB — PROTIME-INR
INR: 1.22 (ref 0.00–1.49)
PROTHROMBIN TIME: 15.5 s — AB (ref 11.6–15.2)

## 2014-03-27 LAB — HEMOGLOBIN A1C
Hgb A1c MFr Bld: 7.8 % — ABNORMAL HIGH (ref <5.7)
MEAN PLASMA GLUCOSE: 177 mg/dL — AB (ref <117)

## 2014-03-27 LAB — T4, FREE: Free T4: 0.99 ng/dL (ref 0.80–1.80)

## 2014-03-27 MED ORDER — ASPIRIN EC 81 MG PO TBEC
81.0000 mg | DELAYED_RELEASE_TABLET | Freq: Every day | ORAL | Status: DC
  Administered 2014-03-28: 81 mg via ORAL
  Filled 2014-03-27: qty 1

## 2014-03-27 MED ORDER — LORATADINE 10 MG PO TABS
10.0000 mg | ORAL_TABLET | Freq: Every day | ORAL | Status: DC
  Administered 2014-03-27 – 2014-03-28 (×2): 10 mg via ORAL
  Filled 2014-03-27 (×2): qty 1

## 2014-03-27 MED ORDER — GLIPIZIDE ER 5 MG PO TB24
5.0000 mg | ORAL_TABLET | Freq: Every day | ORAL | Status: DC
  Filled 2014-03-27 (×2): qty 1

## 2014-03-27 MED ORDER — ONDANSETRON HCL 4 MG/2ML IJ SOLN: 4.0000 mg | Freq: Four times a day (QID) | INTRAMUSCULAR | Status: DC | PRN

## 2014-03-27 MED ORDER — LISINOPRIL 5 MG PO TABS
5.0000 mg | ORAL_TABLET | Freq: Every day | ORAL | Status: DC
  Administered 2014-03-27 – 2014-03-28 (×2): 5 mg via ORAL
  Filled 2014-03-27 (×2): qty 1

## 2014-03-27 MED ORDER — INSULIN DETEMIR 100 UNIT/ML ~~LOC~~ SOLN
80.0000 [IU] | Freq: Every morning | SUBCUTANEOUS | Status: DC
  Filled 2014-03-27: qty 0.8

## 2014-03-27 MED ORDER — METHIMAZOLE 5 MG PO TABS
7.5000 mg | ORAL_TABLET | Freq: Every day | ORAL | Status: DC
  Administered 2014-03-27 – 2014-03-28 (×2): 7.5 mg via ORAL
  Filled 2014-03-27 (×3): qty 2

## 2014-03-27 MED ORDER — BENZONATATE 100 MG PO CAPS: 200.0000 mg | ORAL_CAPSULE | Freq: Three times a day (TID) | ORAL | Status: DC | PRN

## 2014-03-27 MED ORDER — PANTOPRAZOLE SODIUM 40 MG PO TBEC
40.0000 mg | DELAYED_RELEASE_TABLET | Freq: Every day | ORAL | Status: DC
  Administered 2014-03-27 – 2014-03-28 (×2): 40 mg via ORAL
  Filled 2014-03-27 (×2): qty 1

## 2014-03-27 MED ORDER — METFORMIN HCL 500 MG PO TABS
1000.0000 mg | ORAL_TABLET | Freq: Two times a day (BID) | ORAL | Status: DC
  Administered 2014-03-27 – 2014-03-28 (×2): 1000 mg via ORAL
  Filled 2014-03-27 (×2): qty 2

## 2014-03-27 MED ORDER — RIVAROXABAN 20 MG PO TABS
20.0000 mg | ORAL_TABLET | Freq: Every day | ORAL | Status: DC
  Administered 2014-03-27 – 2014-03-28 (×2): 20 mg via ORAL
  Filled 2014-03-27 (×2): qty 1

## 2014-03-27 MED ORDER — NITROGLYCERIN 0.4 MG SL SUBL: 0.4000 mg | SUBLINGUAL_TABLET | SUBLINGUAL | Status: DC | PRN

## 2014-03-27 MED ORDER — ATENOLOL 25 MG PO TABS
100.0000 mg | ORAL_TABLET | Freq: Every day | ORAL | Status: DC
  Administered 2014-03-27 – 2014-03-28 (×2): 100 mg via ORAL
  Filled 2014-03-27 (×2): qty 4

## 2014-03-27 MED ORDER — ACETAMINOPHEN 325 MG PO TABS: 650.0000 mg | ORAL_TABLET | ORAL | Status: DC | PRN

## 2014-03-27 MED ORDER — ATORVASTATIN CALCIUM 40 MG PO TABS
40.0000 mg | ORAL_TABLET | Freq: Every day | ORAL | Status: DC
  Administered 2014-03-27 – 2014-03-28 (×2): 40 mg via ORAL
  Filled 2014-03-27 (×2): qty 1

## 2014-03-27 MED ORDER — INSULIN GLARGINE 100 UNIT/ML ~~LOC~~ SOLN
28.0000 [IU] | Freq: Every day | SUBCUTANEOUS | Status: DC
  Administered 2014-03-28: 28 [IU] via SUBCUTANEOUS
  Filled 2014-03-27: qty 0.28

## 2014-03-27 MED ORDER — NITROGLYCERIN IN D5W 200-5 MCG/ML-% IV SOLN: 3.0000 ug/min | INTRAVENOUS | Status: DC

## 2014-03-27 NOTE — Progress Notes (Signed)
UR Completed Bertrice Leder Graves-Bigelow, RN,BSN [phone]  

## 2014-03-27 NOTE — Progress Notes (Signed)
Spoke to Dr. Radford Pax about nitro drip for pt. Pt is chest pain free at this time. Dr. Radford Pax stated "if pt is chest pain free, then not to do the nitro drip. Will continue to monitor.

## 2014-03-27 NOTE — Progress Notes (Signed)
UR Completed Jacques Willingham Graves-Bigelow, RN,BSN [phone]  

## 2014-03-27 NOTE — Care Management Note (Unsigned)
    Page 1 of 1   03/27/2014     3:09:12 PM CARE MANAGEMENT NOTE 03/27/2014  Patient:  Crystal Peck, Crystal Peck   Account Number:  [id number]  Date Initiated:  03/27/2014  Documentation initiated by:  GRAVES-BIGELOW,Zac Torti  Subjective/Objective Assessment:   Pt admitted for unstable angina. Initiated on Nitro gtt- Hr low and nitro d/c- pt chest pain free.  Plan for myoview 03-27-14.     Action/Plan:   No needs identified from CM at this time.   Anticipated DC Date:  03/28/2014   Anticipated DC Plan:  [place]  CM consult      Choice offered to / List presented to:             Status of service:  Completed, signed off Medicare Important Message given?  NO (If response is "NO", the following Medicare IM given date fields will be blank) Date Medicare IM given:   Medicare IM given by:   Date Additional Medicare IM given:   Additional Medicare IM given by:    Discharge Disposition:    Per UR Regulation:  Reviewed for med. necessity/level of care/duration of stay  If discussed at [place] of Stay Meetings, dates discussed:    Comments:

## 2014-03-27 NOTE — Plan of Care (Signed)
Problem: Consults Goal: Chest Pain Patient Education (See Patient Education module for education specifics.)  Outcome: Completed/Met Date Met:  03/27/14 Goal: Skin Care Protocol Initiated - if Braden Score 18 or less If consults are not indicated, leave blank or document N/A  Outcome: Completed/Met Date Met:  03/27/14 Goal: Tobacco Cessation referral if indicated Outcome: Not Applicable Date Met:  60/04/59 Goal: Nutrition Consult-if indicated Outcome: Completed/Met Date Met:  03/27/14 Goal: Diabetes Guidelines if Diabetic/Glucose > 140 If diabetic or lab glucose is > 140 mg/dl - Initiate Diabetes/Hyperglycemia Guidelines & Document Interventions  Outcome: Completed/Met Date Met:  03/27/14  Problem: Phase I Progression Outcomes Goal: Hemodynamically stable Outcome: Completed/Met Date Met:  03/27/14 Goal: Anginal pain relieved Outcome: Completed/Met Date Met:  03/27/14 Goal: Aspirin unless contraindicated Outcome: Completed/Met Date Met:  03/27/14 Goal: MD aware of Cardiac Marker results Outcome: Completed/Met Date Met:  03/27/14 Goal: Voiding-avoid urinary catheter unless indicated Outcome: Completed/Met Date Met:  03/27/14 Goal: Other Phase I Outcomes/Goals Outcome: Completed/Met Date Met:  03/27/14

## 2014-03-27 NOTE — Progress Notes (Signed)
[address] [place], [place]  [address] Phone: [phone] Fax:  [phone]  Date:  03/27/2014   ID:  Crystal Peck, DOB March 29, 1966, MRN [id number]  PCP:  [name], MD  Cardiologist:  Fransico Him, MD    History of Present Illness: Crystal Peck is a 48 y.o. female with a history of type II DM, HTN, dyslipidemia and GERD who was hospitalized in October with rapid atrial fib with RVR.  She converted to NSR on Cardizem gtt.  She was started on Xarelto for CHADS2VASC score of 3.  She has a history of hyperthyroidism and is on Methimazole. Her TSH at the time of admission was suppressed and she is followed by endocrinology.  She says that she awakened this am and has some nausea and abdominal pain in the mid abdomen and felt SOB and felt some palpitations.  She then has some chest discomfort and back pain.  This lasted about 2 hours and she still has some chest pressure.  She has also been having a burning in her back.   Wt Readings from Last 3 Encounters:  03/27/14 183 lb (83.008 kg)  02/21/14 182 lb (82.555 kg)  02/15/14 178 lb 4.8 oz (80.876 kg)     Past Medical History  Diagnosis Date  . Asthma   . Diabetes mellitus   . Hyperlipidemia   . Hypertension   . GERD (gastroesophageal reflux disease)   . Cerebellar tumor   . Obesity   . Sleep apnea   . A-fib   . Thyroid disease     Current Outpatient Prescriptions  Medication Sig Dispense Refill  . albuterol (PROVENTIL HFA;VENTOLIN HFA) 108 (90 BASE) MCG/ACT inhaler Inhale 2 puffs into the lungs every 4 (four) hours as needed for wheezing or shortness of breath.    Marland Kitchen atenolol (TENORMIN) 100 MG tablet Take 1 tablet (100 mg total) by mouth daily. 30 tablet 2  . atorvastatin (LIPITOR) 40 MG tablet Take 40 mg by mouth daily.    Marland Kitchen azelastine (OPTIVAR) 0.05 % ophthalmic solution Place 1 drop into both eyes 2 (two) times daily as needed (for allergies).    . beclomethasone (QVAR) 80 MCG/ACT inhaler Inhale 1 puff  into the lungs 2 (two) times daily as needed (asthma).    . benzonatate (TESSALON) 200 MG capsule Take 1 capsule (200 mg total) by mouth 3 (three) times daily as needed for cough. 60 capsule 0  . cetirizine (ZYRTEC) 10 MG tablet Take 1 tablet (10 mg total) by mouth daily. 30 tablet 3  . CINNAMON PO Take by mouth.    Marland Kitchen glipiZIDE (GLUCOTROL XL) 5 MG 24 hr tablet Take 5 mg by mouth daily with breakfast.    . glucose blood test strip 1 each by Other route as needed. Onetouch ultra test strip     . insulin detemir (LEVEMIR FLEXPEN) 100 UNIT/ML injection Inject 80 Units into the skin every morning.    . INVOKANA 100 MG TABS tablet Take 1 tablet by mouth daily.  3  . IRON PO Take 1 tablet by mouth daily.    Marland Kitchen LANTUS SOLOSTAR 100 UNIT/ML Solostar Pen daily. AS DIRECTED  5  . lisinopril (PRINIVIL,ZESTRIL) 5 MG tablet Take 5 mg by mouth daily.    . metFORMIN (GLUCOPHAGE) 1000 MG tablet Take 1,000 mg by mouth 2 (two) times daily with a meal.    . methimazole (TAPAZOLE) 5 MG tablet Take 7.5 mg by mouth daily.  2  .  Multiple Vitamin (MULTIVITAMIN WITH MINERALS) TABS tablet Take 1 tablet by mouth daily.    . Omega-3 Fatty Acids (FISH OIL) 1000 MG CAPS Take 1,000 mg by mouth 3 (three) times daily.     . pantoprazole (PROTONIX) 40 MG tablet Take 40 mg by mouth daily.    . potassium chloride SA (K-DUR,KLOR-CON) 20 MEQ tablet Take 20 mEq by mouth daily as needed (for leg cramping). Take one tablet by mouth daily.    . rivaroxaban (XARELTO) 20 MG TABS tablet Take 1 tablet (20 mg total) by mouth daily with supper. 30 tablet 3   No current facility-administered medications for this visit.    Allergies:   No Known Allergies  Social History:  The patient  reports that she has never smoked. She does not have any smokeless tobacco history on file. She reports that she does not drink alcohol or use illicit drugs.   Family History:  The patient's family history includes Coronary artery disease in her father; Diabetes  in an other family member; Hyperlipidemia in her other; Hypertension in her father.   ROS:  Please see the history of present illness.      All other systems reviewed and negative.   PHYSICAL EXAM: VS:  BP 124/64 mmHg  Pulse 52  Ht 5\' 1"  (1.549 m)  Wt 183 lb (83.008 kg)  BMI 34.60 kg/m2 Well nourished, well developed, in no acute distress HEENT: normal Neck: no JVD Cardiac:  normal S1, S2; RRR; no murmur Lungs:  clear to auscultation bilaterally, no wheezing, rhonchi or rales Abd: soft, nontender, no hepatomegaly Ext: no edema Skin: warm and dry Neuro:  CNs 2-12 intact, no focal abnormalities noted  EKG: sinus bradycardia at 52bpm with no ST changes       ASSESSMENT AND PLAN: 1.   Chest pressure associated with burning in her back and nausea - this same presentation occurred that last time she was admitted with afib with RVR but she is in NSR today.  She has been having frequent episodes of burning in her back which could be due to GI etiology but I am concerned that this could be coronary ischemia.  She continues to complain of chest pressure in the office which has been going on for 2 hours.  EKG is nonischemic.  She has multiple cardiac risk factors including DM, HTN, dyslipidemia and obesity.  I have recommended admitting to [facility] telemetry to rule out MI with serial cardiac enzymes.  Will continue Atenolol.  Hold on ASA since she is on Xarelto.  Continue statin.  If she rules out for MI then Lexiscan myoview in am.  Will start Protonix 40mg  daily in case this is GERD.  Will place on IV NTG for CP.   2.  Paroxysmal atrial fibrillation maintaining NSR.  Continue Tenormin/Xarelto 3.  HTN well controlled.  Continue BB/ACE I 4.  Hyperthyroidism- check TSH/T3 and T4   Signed, Fransico Him, MD [facility] 03/27/2014 10:06 AM

## 2014-03-28 ENCOUNTER — Observation Stay (HOSPITAL_COMMUNITY): Payer: 59

## 2014-03-28 ENCOUNTER — Encounter (HOSPITAL_COMMUNITY): Payer: Self-pay | Admitting: Physician Assistant

## 2014-03-28 DIAGNOSIS — R079 Chest pain, unspecified: Secondary | ICD-10-CM

## 2014-03-28 DIAGNOSIS — I209 Angina pectoris, unspecified: Secondary | ICD-10-CM | POA: Diagnosis not present

## 2014-03-28 LAB — OCCULT BLOOD X 1 CARD TO LAB, STOOL: FECAL OCCULT BLD: NEGATIVE

## 2014-03-28 LAB — GLUCOSE, CAPILLARY
GLUCOSE-CAPILLARY: 137 mg/dL — AB (ref 70–99)
Glucose-Capillary: 122 mg/dL — ABNORMAL HIGH (ref 70–99)
Glucose-Capillary: 113 mg/dL — ABNORMAL HIGH (ref 70–99)

## 2014-03-28 LAB — MRSA PCR SCREENING: MRSA BY PCR: NEGATIVE

## 2014-03-28 LAB — TROPONIN I: Troponin I: <0.3 ng/mL (ref <0.30)

## 2014-03-28 MED ORDER — REGADENOSON 0.4 MG/5ML IV SOLN
INTRAVENOUS | Status: AC
  Administered 2014-03-28: 0.4 mg
  Filled 2014-03-28: qty 5

## 2014-03-28 MED ORDER — TECHNETIUM TC 99M SESTAMIBI GENERIC - CARDIOLITE
10.0000 | Freq: Once | INTRAVENOUS | Status: AC | PRN
  Administered 2014-03-28: 10 via INTRAVENOUS

## 2014-03-28 MED ORDER — REGADENOSON 0.4 MG/5ML IV SOLN: 0.4000 mg | Freq: Once | INTRAVENOUS | Status: AC

## 2014-03-28 MED ORDER — TECHNETIUM TC 99M SESTAMIBI GENERIC - CARDIOLITE
30.0000 | Freq: Once | INTRAVENOUS | Status: AC | PRN
  Administered 2014-03-28: 30 via INTRAVENOUS

## 2014-03-28 NOTE — Discharge Summary (Signed)
Discharge instructions reviewed with patient. Questions answered. Denies further questions. AVS given to patient. IV and telemetry discontinued. Patient to get dressed then her son will take her home.

## 2014-03-28 NOTE — Discharge Summary (Signed)
Discharge Summary   Patient ID: Crystal Peck,  MRN: [id number], DOB/AGE: May 25, 1965 48 y.o.  Admit date: 03/27/2014 Discharge date: 03/28/2014  Primary Care Provider: [name] Primary Cardiologist: Fransico Him, MD  Discharge Diagnoses Principal Problem:   Chest pain - negative lexiscan myoview Active Problems:   Essential hypertension   GERD   Dyslipidemia   PAF (paroxysmal atrial fibrillation)   Hyperthyroidism   Allergies No Known Allergies  Procedures  Lexiscan stress test 03/28/2014 IMPRESSION: 1. This is interpreted as a normal Lexiscan Myoview study. There is no evidence of reversible ischemia.  2. There are no wall motion abnormalities  3. Left ventricular ejection fraction 48% but it is clear that the computer-generated LV function is less than the actual LV function. I would grade the LVEF at around 55-60%. There are no segment wall motion abnormalities  4. This is an normal stress Myoview.    Hospital Course  The patient is a 48 year old African-American female with past medical history of PAF controlled on beta blocker and Xarelto, type 2 DM, hypertension, hyperlipidemia, GERD and hyperthyroidism on methimazole. Patient was seen in the office by Dr. Radford Pax on 03/27/2014 at which time she complained of waking up that morning with some nausea, abdominal pain, shortness breath and palpitation. She had some chest discomfort and back pain. Given her cardiac risk factors. She was admitted from the office overnight for observation. Initial labs shows Cr 0.63, hgb 11.3, normal TSH/FT4, A1C 7.8. EKG was negative for ischemic changes. Serial troponin was negative overnight. She underwent Lexiscan stress test in the morning of 03/28/2014, which showed preserved EF, negative for ischemia or infarct. She is deemed stable for discharge from cardiac perspective.  She has been on protonix for control of acid reflux symptom. Her chest discomfort is unlikely to be  cardiac in origin. I will schedule follow-up with Dr. Radford Pax in 2-4 weeks. She has been advised to contact us if has recurrent chest pain while on Protonix. She has also been advised to contact her PCP since her DM appears to be uncontrolled with A1C of 7.8    Discharge Vitals Blood pressure 153/57, pulse 69, temperature 98.1 F (36.7 C), temperature source Oral, resp. rate 15, height 5\' 1"  (1.549 m), weight 179 lb 3.7 oz (81.3 kg), SpO2 100 %.  Filed Weights   03/28/14 0425  Weight: 179 lb 3.7 oz (81.3 kg)    Labs  CBC  Recent Labs  03/27/14 1238  WBC 5.3  NEUTROABS 2.5  HGB 11.3*  HCT 35.0*  MCV 87.7  PLT 809   Basic Metabolic Panel  Recent Labs  03/27/14 1238  NA 141  K 3.6*  CL 105  CO2 22  GLUCOSE 97  BUN 16  CREATININE 0.63  CALCIUM 9.2  MG 2.1   Liver Function Tests  Recent Labs  03/27/14 1238  AST 28  ALT 47*  ALKPHOS 95  BILITOT 0.6  PROT 6.9  ALBUMIN 3.4*   Cardiac Enzymes  Recent Labs  03/27/14 1238 03/27/14 1852 03/27/14 2355  TROPONINI <0.30 <0.30 <0.30   Hemoglobin A1C  Recent Labs  03/27/14 1238  HGBA1C 7.8*   Thyroid Function Tests  Recent Labs  03/27/14 1238  TSH 0.771    Disposition  Pt is being discharged home today in good condition.  Follow-up Plans & Appointments      Follow-up Information    Follow up with Truitt Merle, NP On 04/17/2014.   Specialty:  Nurse Practitioner   Why:  9:30am  Contact information:   [place] [place] [address] [phone]       Please follow up.   Why:  Followup with your primary care provider for uncontrolled DM      Discharge Medications    Medication List    STOP taking these medications        benzonatate 200 MG capsule  Commonly known as:  TESSALON      TAKE these medications        albuterol 108 (90 BASE) MCG/ACT inhaler  Commonly known as:  PROVENTIL HFA;VENTOLIN HFA  Inhale 2 puffs into the lungs every 4 (four) hours as  needed for wheezing or shortness of breath.     atenolol 100 MG tablet  Commonly known as:  TENORMIN  Take 1 tablet (100 mg total) by mouth daily.     atorvastatin 40 MG tablet  Commonly known as:  LIPITOR  Take 40 mg by mouth every evening.     azelastine 0.05 % ophthalmic solution  Commonly known as:  OPTIVAR  Place 1 drop into both eyes 2 (two) times daily.     beclomethasone 80 MCG/ACT inhaler  Commonly known as:  QVAR  Inhale 1 puff into the lungs 2 (two) times daily as needed (asthma).     cetirizine 10 MG tablet  Commonly known as:  ZYRTEC  Take 1 tablet (10 mg total) by mouth daily.     Fish Oil 1000 MG Caps  Take 2,000 mg by mouth every evening.     glipiZIDE 5 MG 24 hr tablet  Commonly known as:  GLUCOTROL XL  Take 5 mg by mouth daily with breakfast.     glucose blood test strip  1 each by Other route as needed. Onetouch ultra test strip     INVOKANA 100 MG Tabs tablet  Generic drug:  canagliflozin  Take 1 tablet by mouth daily.     IRON PO  Take 2 tablets by mouth 3 (three) times a week.     LANTUS SOLOSTAR 100 UNIT/ML Solostar Pen  Generic drug:  Insulin Glargine  Inject 28 Units into the skin every morning. AS DIRECTED     lisinopril 5 MG tablet  Commonly known as:  PRINIVIL,ZESTRIL  Take 5 mg by mouth daily.     metFORMIN 1000 MG tablet  Commonly known as:  GLUCOPHAGE  Take 1,000 mg by mouth 2 (two) times daily with a meal.     methimazole 5 MG tablet  Commonly known as:  TAPAZOLE  Take 7.5 mg by mouth every evening.     multivitamin with minerals Tabs tablet  Take 1 tablet by mouth every evening.     pantoprazole 40 MG tablet  Commonly known as:  PROTONIX  Take 40 mg by mouth every evening.     potassium chloride SA 20 MEQ tablet  Commonly known as:  K-DUR,KLOR-CON  Take 20 mEq by mouth daily as needed (for leg cramping). Take one tablet by mouth daily.     rivaroxaban 20 MG Tabs tablet  Commonly known as:  XARELTO  Take 1 tablet (20  mg total) by mouth daily with supper.        Duration of Discharge Encounter   Greater than 30 minutes including physician time.  Hilbert Corrigan PA-C Pager: [phone] 03/28/2014, 3:15 PM

## 2014-03-28 NOTE — Progress Notes (Signed)
Patient Name: Crystal Peck Date of Encounter: 03/28/2014     Active Problems:   Essential hypertension   Dyslipidemia   PAF (paroxysmal atrial fibrillation)   Hyperthyroidism   Unstable angina pectoris   Unstable angina    SUBJECTIVE  Denies any CP now, had some last night. No SOB or nausea  CURRENT MEDS . aspirin EC  81 mg Oral Daily  . atenolol  100 mg Oral Daily  . atorvastatin  40 mg Oral Daily  . glipiZIDE  5 mg Oral Q breakfast  . insulin glargine  28 Units Subcutaneous Daily  . lisinopril  5 mg Oral Daily  . loratadine  10 mg Oral Daily  . metFORMIN  1,000 mg Oral BID WC  . methimazole  7.5 mg Oral Daily  . pantoprazole  40 mg Oral Daily  . rivaroxaban  20 mg Oral Q supper    OBJECTIVE  Filed Vitals:   03/27/14 1240 03/27/14 1704 03/27/14 2100 03/28/14 0425  BP:  130/49 136/61 142/55  Pulse: 60  61 63  Temp:   98.4 F (36.9 C) 98.4 F (36.9 C)  TempSrc:   Oral Oral  Resp:   18 18  Height:    5\' 1"  (1.549 m)  Weight:    179 lb 3.7 oz (81.3 kg)  SpO2:   100% 98%    Intake/Output Summary (Last 24 hours) at 03/28/14 1042 Last data filed at 03/27/14 2200  Gross per 24 hour  Intake    360 ml  Output      0 ml  Net    360 ml   Filed Weights   03/28/14 0425  Weight: 179 lb 3.7 oz (81.3 kg)    PHYSICAL EXAM  General: Pleasant, NAD. Neuro: Alert and oriented X 3. Moves all extremities spontaneously. Psych: Normal affect. HEENT:  Normal  Neck: Supple without bruits or JVD. Lungs:  Resp regular and unlabored, CTA. Heart: RRR no s3, s4, or murmurs. Abdomen: Soft, non-tender, non-distended, BS + x 4.  Extremities: No clubbing, cyanosis or edema. DP/PT/Radials 2+ and equal bilaterally.  Accessory Clinical Findings  CBC  Recent Labs  03/27/14 1238  WBC 5.3  NEUTROABS 2.5  HGB 11.3*  HCT 35.0*  MCV 87.7  PLT 007   Basic Metabolic Panel  Recent Labs  03/27/14 1238  NA 141  K 3.6*  CL 105  CO2 22  GLUCOSE 97  BUN 16  CREATININE  0.63  CALCIUM 9.2  MG 2.1   Liver Function Tests  Recent Labs  03/27/14 1238  AST 28  ALT 47*  ALKPHOS 95  BILITOT 0.6  PROT 6.9  ALBUMIN 3.4*   Cardiac Enzymes  Recent Labs  03/27/14 1238 03/27/14 1852 03/27/14 2355  TROPONINI <0.30 <0.30 <0.30   Hemoglobin A1C  Recent Labs  03/27/14 1238  HGBA1C 7.8*   Thyroid Function Tests  Recent Labs  03/27/14 1238  TSH 0.771    TELE NSR with HR 70s    ECG  NSR with poor R wave progression in anterior lead  Echocardiogram 02/14/2014  ------------------------------------------------------------------- LV EF: 55% -  60%  ------------------------------------------------------------------- Indications:   Shortness of breath 786.05.  ------------------------------------------------------------------- History:  PMH:  Chest pain. Atrial fibrillation. Risk factors: Hypertension. Diabetes mellitus. Dyslipidemia.  ------------------------------------------------------------------- Study Conclusions  - Left ventricle: The cavity size was normal. Wall thickness was increased in a pattern of mild LVH. Systolic function was normal. The estimated ejection fraction was in the range of 55% to 60%.  Wall motion was normal; there were no regional wall motion abnormalities. - Pulmonary arteries: PA peak pressure: 35 mm Hg (S). - Pericardium, extracardiac: A trivial pericardial effusion was identified.     Radiology/Studies  No results found.  ASSESSMENT AND PLAN  1. Chest pain  - neg serial trop, plan for lexiscan myoview, if normal, plan for discharge  2. PAF on Xarelto/Tenormin 3. type II DM: A1C 7.8, need better control 4. HTN 5. dyslipidemia  6. GERD  7. hyperthyroidism on Methimazole  - TSH and free T4 normal   Ruben Im Pager: [phone]  Patient seen, examined. Available data reviewed. Agree with findings, assessment, and plan as outlined by Almyra Deforest, PA-C. Pt back from  nuclear medicine. Feeling better. Nuclear scan without ischemia. Labs and radiographic data reviewed and I think sx's are unlikely to be cardiac. [place] for discharge today. F/u with PCP/Dr Radford Pax.   Sherren Mocha, M.D. 03/28/2014 2:24 PM

## 2014-03-28 NOTE — Progress Notes (Signed)
Lexiscan stress test completed without complication. Final result pending by [facility] reader.  Hilbert Corrigan PA Pager: [phone]

## 2014-03-28 NOTE — Progress Notes (Signed)
UR completed 

## 2014-03-28 NOTE — Discharge Instructions (Signed)

## 2014-03-28 NOTE — Plan of Care (Signed)
Problem: Phase II Progression Outcomes Goal: Hemodynamically stable Outcome: Completed/Met Date Met:  03/28/14 Goal: Anginal pain relieved Outcome: Completed/Met Date Met:  03/28/14

## 2014-03-28 NOTE — Plan of Care (Signed)
Problem: Phase II Progression Outcomes Goal: Stress Test if indicated Outcome: Completed/Met Date Met:  03/28/14 Goal: Cath/PCI Day Path if indicated Outcome: Not Applicable Date Met:  26/83/41 Goal: CV Risk Factors identified Outcome: Completed/Met Date Met:  03/28/14 Goal: Cardiac Rehab if ordered Outcome: Not Applicable Date Met:  96/22/29 Goal: If positive for MI, change to MI Path Outcome: Not Applicable Date Met:  79/89/21 Goal: Other Phase II Outcomes/Goals Outcome: Not Applicable Date Met:  19/41/74  Problem: Phase III Progression Outcomes Goal: Hemodynamically stable Outcome: Completed/Met Date Met:  03/28/14 Goal: No anginal pain Outcome: Completed/Met Date Met:  03/28/14 Goal: Cath/PCI Path as indicated Outcome: Not Applicable Date Met:  12/02/46 Goal: Vascular site scale level 0 - I Vascular Site Scale Level 0: No bruising/bleeding/hematoma Level I (Mild): Bruising/Ecchymosis, minimal bleeding/ooozing, palpable hematoma < 3 cm Level II (Moderate): Bleeding not affecting hemodynamic parameters, pseudoaneurysm, palpable hematoma > 3 cm Level III (Severe) Bleeding which affects hemodynamic parameters or retroperitoneal hemorrhage  Outcome: Not Applicable Date Met:  18/56/31 Goal: Discharge plan remains appropriate-arrangements made Outcome: Completed/Met Date Met:  03/28/14 Goal: Tolerating diet Outcome: Completed/Met Date Met:  03/28/14 Goal: If positive for MI, change to MI Path Outcome: Not Applicable Date Met:  49/70/26 Goal: Other Phase III Outcomes/Goals Outcome: Not Applicable Date Met:  37/85/88  Problem: Discharge Progression Outcomes Goal: No anginal pain Outcome: Completed/Met Date Met:  03/28/14 Goal: Hemodynamically stable Outcome: Completed/Met Date Met:  50/27/74 Goal: Complications resolved/controlled Outcome: Not Applicable Date Met:  12/87/86 Goal: Barriers To Progression Addressed/Resolved Outcome: Not Applicable Date Met:  76/72/09 Goal:  Discharge plan in place and appropriate Outcome: Completed/Met Date Met:  03/28/14 Goal: Vascular site scale level 0 - I Vascular Site Scale Level 0: No bruising/bleeding/hematoma Level I (Mild): Bruising/Ecchymosis, minimal bleeding/ooozing, palpable hematoma < 3 cm Level II (Moderate): Bleeding not affecting hemodynamic parameters, pseudoaneurysm, palpable hematoma > 3 cm Level III (Severe) Bleeding which affects hemodynamic parameters or retroperitoneal hemorrhage  Outcome: Not Applicable Date Met:  47/09/62 Goal: Tolerates diet Outcome: Completed/Met Date Met:  03/28/14 Goal: Activity appropriate for discharge plan Outcome: Completed/Met Date Met:  03/28/14 Goal: Other Discharge Outcomes/Goals Outcome: Not Applicable Date Met:  83/66/29

## 2014-04-17 ENCOUNTER — Encounter: Payer: Self-pay | Admitting: Nurse Practitioner

## 2014-04-17 ENCOUNTER — Ambulatory Visit (INDEPENDENT_AMBULATORY_CARE_PROVIDER_SITE_OTHER): Payer: 59 | Admitting: Nurse Practitioner

## 2014-04-17 VITALS — BP 140/78 | HR 60 | Ht 61.0 in | Wt 182.0 lb

## 2014-04-17 DIAGNOSIS — R079 Chest pain, unspecified: Secondary | ICD-10-CM

## 2014-04-17 DIAGNOSIS — I10 Essential (primary) hypertension: Secondary | ICD-10-CM

## 2014-04-17 DIAGNOSIS — Z7901 Long term (current) use of anticoagulants: Secondary | ICD-10-CM

## 2014-04-17 DIAGNOSIS — I480 Paroxysmal atrial fibrillation: Secondary | ICD-10-CM

## 2014-04-17 LAB — CBC
HCT: 36.3 % (ref 36.0–46.0)
Hemoglobin: 11.6 g/dL — ABNORMAL LOW (ref 12.0–15.0)
MCHC: 31.8 g/dL (ref 30.0–36.0)
MCV: 88.1 fl (ref 78.0–100.0)
Platelets: 297 10*3/uL (ref 150.0–400.0)
RBC: 4.12 Mil/uL (ref 3.87–5.11)
RDW: 13.5 % (ref 11.5–15.5)
WBC: 6.2 10*3/uL (ref 4.0–10.5)

## 2014-04-17 NOTE — Patient Instructions (Addendum)
We will be checking the following labs today - CBC  Stay on your current medicines  See Dr. Radford Pax back in 4 months  Try to stop the soda  Ok to use Tylenol for pain/discomfort  Walking every day  Call the [place] office at [phone] if you have any questions, problems or concerns.

## 2014-04-17 NOTE — Progress Notes (Signed)
Crystal Peck Date of Birth: 12/06/1965 Medical Record #[id number]  History of Present Illness: [name] is seen back today for a post hospital visit. Seen for Dr. Radford Pax. She is a 48 year old female with HLD, HTN, GERD, type 2 DM, OSA, PAF and hyperthyroidism on methimazole. She is on chronic anticoagulation with Xarelto.  Seen here earlier this month by Dr. Radford Pax. Noted to have been admitted in October with AF with RVR - converted with Cardizem gtt. CHADSVASC of 3 - placed on Xarelto. At that visit, the patient was having chest pain - despite being in NSR. She was referred on for admission. Negative Myoview. Negative enzymes. To continue with medical management.  Comes back today. Here alone. Doing ok. She has no more chest pain. Not short of breath. Back walking. No bleeding/excessive bruising that she notes. She tells me her stool was checked for blood while in the hospital and it was negative. Has issues wearing her CPAP but seeing her sleep doctor next month. Tolerating her medicines. BP pretty good at home. She is happy with how she is doing. She does drink an excessive amount of soda. She notes that she is trying to cut it back. No alcohol.   Current Outpatient Prescriptions  Medication Sig Dispense Refill  . albuterol (PROVENTIL HFA;VENTOLIN HFA) 108 (90 BASE) MCG/ACT inhaler Inhale 2 puffs into the lungs every 4 (four) hours as needed for wheezing or shortness of breath.    Marland Kitchen atenolol (TENORMIN) 100 MG tablet Take 1 tablet (100 mg total) by mouth daily. (Patient taking differently: Take 100 mg by mouth every evening. ) 30 tablet 2  . atorvastatin (LIPITOR) 40 MG tablet Take 40 mg by mouth every evening.     Marland Kitchen azelastine (OPTIVAR) 0.05 % ophthalmic solution Place 1 drop into both eyes 2 (two) times daily.     . beclomethasone (QVAR) 80 MCG/ACT inhaler Inhale 1 puff into the lungs 2 (two) times daily as needed (asthma).    . cetirizine (ZYRTEC) 10 MG tablet Take 1 tablet (10 mg total)  by mouth daily. 30 tablet 3  . glipiZIDE (GLUCOTROL XL) 5 MG 24 hr tablet Take 5 mg by mouth daily with breakfast.    . glucose blood test strip 1 each by Other route as needed. Onetouch ultra test strip     . INVOKANA 100 MG TABS tablet Take 1 tablet by mouth daily.  3  . IRON PO Take 2 tablets by mouth 3 (three) times a week.     Marland Kitchen LANTUS SOLOSTAR 100 UNIT/ML Solostar Pen Inject 28 Units into the skin every morning. AS DIRECTED  5  . lisinopril (PRINIVIL,ZESTRIL) 5 MG tablet Take 5 mg by mouth daily.    . metFORMIN (GLUCOPHAGE) 1000 MG tablet Take 1,000 mg by mouth 2 (two) times daily with a meal.    . methimazole (TAPAZOLE) 5 MG tablet Take 7.5 mg by mouth every evening.   2  . Multiple Vitamin (MULTIVITAMIN WITH MINERALS) TABS tablet Take 1 tablet by mouth every evening.     . Omega-3 Fatty Acids (FISH OIL) 1000 MG CAPS Take 2,000 mg by mouth every evening.     . pantoprazole (PROTONIX) 40 MG tablet Take 40 mg by mouth every evening.     . potassium chloride SA (K-DUR,KLOR-CON) 20 MEQ tablet Take 20 mEq by mouth daily as needed (for leg cramping). Take one tablet by mouth daily.    . rivaroxaban (XARELTO) 20 MG TABS tablet Take 1  tablet (20 mg total) by mouth daily with supper. 30 tablet 3   No current facility-administered medications for this visit.    No Known Allergies  Past Medical History  Diagnosis Date  . Asthma   . Diabetes mellitus   . Hyperlipidemia   . Hypertension   . GERD (gastroesophageal reflux disease)   . Cerebellar tumor   . Obesity   . Sleep apnea   . A-fib   . Thyroid disease   . Chest pain 03/27/2014    negative lexiscan myoview    Past Surgical History  Procedure Laterality Date  . Cholecystectomy    . Partial hysterectomy      ovaries remain  . Nasal sinus surgery      Dr. Lyn Hollingshead  . Abdominal hysterectomy      History  Smoking status  . Never Smoker   Smokeless tobacco  . Never Used    History  Alcohol Use No    Family History    Problem Relation Age of Onset  . Coronary artery disease Father   . Hypertension Father   . Diabetes      maternal grandparents  . Hyperlipidemia Other     Review of Systems: The review of systems is per the HPI.  All other systems were reviewed and are negative.  Physical Exam: BP 140/78 mmHg  Pulse 60  Ht 5\' 1"  (1.549 m)  Wt 182 lb (82.555 kg)  BMI 34.41 kg/m2 Patient is very pleasant and in no acute distress. Skin is warm and dry. Color is normal.  HEENT is unremarkable. Normocephalic/atraumatic. PERRL. Sclera are nonicteric. Neck is supple. No masses. No JVD. Lungs are clear. Cardiac exam shows a regular rate and rhythm. Abdomen is soft. Extremities are without edema. Gait and ROM are intact. No gross neurologic deficits noted.  Wt Readings from Last 3 Encounters:  04/17/14 182 lb (82.555 kg)  03/28/14 179 lb 3.7 oz (81.3 kg)  03/27/14 183 lb (83.008 kg)    LABORATORY DATA/PROCEDURES: CBC pending  Lab Results  Component Value Date   WBC 5.3 03/27/2014   HGB 11.3* 03/27/2014   HCT 35.0* 03/27/2014   PLT 306 03/27/2014   GLUCOSE 97 03/27/2014   CHOL 216* 02/15/2014   TRIG 182* 02/15/2014   HDL 41 02/15/2014   LDLDIRECT 183.1 02/24/2013   LDLCALC 139* 02/15/2014   ALT 47* 03/27/2014   AST 28 03/27/2014   NA 141 03/27/2014   K 3.6* 03/27/2014   CL 105 03/27/2014   CREATININE 0.63 03/27/2014   BUN 16 03/27/2014   CO2 22 03/27/2014   TSH 0.771 03/27/2014   INR 1.22 03/27/2014   HGBA1C 7.8* 03/27/2014    BNP (last 3 results) No results for input(s): PROBNP in the last 8760 hours.   Lab Results  Component Value Date   CKTOTAL 122 07/13/2012   CKMB 1.7 07/13/2012   TROPONINI <0.30 03/27/2014      Lexiscan stress test 03/28/2014 IMPRESSION: 1. This is interpreted as a normal Lexiscan Myoview study. There is no evidence of reversible ischemia.  2. There are no wall motion abnormalities  3. Left ventricular ejection fraction 48% but it is clear that  the computer-generated LV function is less than the actual LV function. I would grade the LVEF at around 55-60%. There are no segment wall motion abnormalities  4. This is an normal stress Myoview.    Assessment / Plan: 1. Chest pain - negative Myoview - would focus on CV risk factor modification.  She has had no more chest pain.  2. PAF - in sinus by exam today.  3. Chronic anticoagulation with Xarelto - little anemic on her labs from earlier this month - would repeat CBC.   4. HTN -  BP better at home. She will continue to monitor.   5. HLD - on statin therapy  6. DM  7. Obesity - encouraged her to stop the soda. Walk daily.   8. Hyperthyroid - on current treatment - last TSH ok  See back in 4 months. No change in her current regimen.   Patient is agreeable to this plan and will call if any problems develop in the interim.   Burtis Junes, RN, [place] [address] [place], [place]  [id number] [phone]

## 2014-05-02 LAB — TSH: TSH: 0.79 u[IU]/mL (ref <=5.90)

## 2014-05-22 ENCOUNTER — Other Ambulatory Visit: Payer: Self-pay | Admitting: General Practice

## 2014-05-22 MED ORDER — ATENOLOL 100 MG PO TABS: 100.0000 mg | ORAL_TABLET | Freq: Every evening | ORAL | Status: DC

## 2014-05-28 ENCOUNTER — Other Ambulatory Visit: Payer: Self-pay | Admitting: Family Medicine

## 2014-05-28 NOTE — Telephone Encounter (Signed)
Med filled.  

## 2014-06-07 ENCOUNTER — Encounter: Payer: Self-pay | Admitting: Family Medicine

## 2014-06-07 ENCOUNTER — Ambulatory Visit (INDEPENDENT_AMBULATORY_CARE_PROVIDER_SITE_OTHER): Payer: 59 | Admitting: Family Medicine

## 2014-06-07 VITALS — BP 146/86 | HR 68 | Temp 98.0°F | Resp 16 | Wt 183.4 lb

## 2014-06-07 DIAGNOSIS — R0789 Other chest pain: Secondary | ICD-10-CM

## 2014-06-07 DIAGNOSIS — K219 Gastro-esophageal reflux disease without esophagitis: Secondary | ICD-10-CM

## 2014-06-07 MED ORDER — GI COCKTAIL ~~LOC~~
30.0000 mL | Freq: Once | ORAL | Status: AC
  Administered 2014-06-07: 30 mL via ORAL

## 2014-06-07 MED ORDER — PANTOPRAZOLE SODIUM 40 MG PO TBEC: 40.0000 mg | DELAYED_RELEASE_TABLET | Freq: Every day | ORAL | Status: DC

## 2014-06-07 NOTE — Patient Instructions (Signed)
Follow up 6-8 weeks to recheck BP and cholesterol Restart the Protonix daily to decrease the acid production- which is the cause of your chest pain Try and avoid caffeine, spicy foods while restarting the Protonix Call with any questions or concerns Hang in there!!!

## 2014-06-07 NOTE — Progress Notes (Signed)
   Subjective:    Patient ID: Crystal Peck, female    DOB: 10/13/1965, 49 y.o.   MRN: [id number]  HPI Chest pain- recurrent problem for pt.  She was admitted in December w/ CP and had normal Myoview stress.  Current sxs started Monday w/ 'my back had started burning and my stomach was having gas pains'.  Had CP earlier this AM while on way to work.  This made pt very anxious.  CP lasted x2 hrs.  Some improvement in CP w/ water and gingerale prior to arrival.  Upon arrival here in office, pt was given GI cocktail and pain resolved.  Reports she wakes w/ nausea.  Has been off PPI for unknown duration.   Review of Systems For ROS see HPI   Reviewed meds, allergies, problem list, and PMH in chart     Objective:   Physical Exam  Constitutional: She is oriented to person, place, and time. She appears well-developed and well-nourished. No distress.  HENT:  Head: Normocephalic and atraumatic.  Eyes: Conjunctivae and EOM are normal. Pupils are equal, round, and reactive to light.  Neck: Normal range of motion. Neck supple. No thyromegaly present.  Cardiovascular: Normal rate, regular rhythm, normal heart sounds and intact distal pulses.   No murmur heard. Pulmonary/Chest: Effort normal and breath sounds normal. No respiratory distress.  Abdominal: Soft. She exhibits no distension. There is no tenderness.  Musculoskeletal: She exhibits no edema.  Lymphadenopathy:    She has no cervical adenopathy.  Neurological: She is alert and oriented to person, place, and time.  Skin: Skin is warm and dry.  Psychiatric: She has a normal mood and affect. Her behavior is normal.  Vitals reviewed.         Assessment & Plan:

## 2014-06-07 NOTE — Progress Notes (Signed)
Pre visit review using our clinic review tool, if applicable. No additional management support is needed unless otherwise documented below in the visit note. 

## 2014-06-07 NOTE — Assessment & Plan Note (Signed)
Recurrent problem.  Pt had negative Myoview 6 weeks ago.  Pt's CP completely resolved after GI cocktail in office.  Has been off PPI for unknown duration.  Restart today.  EKG unchanged from previous.  Low suspicious for ACS.  Reviewed supportive care and red flags that should prompt return.  Pt expressed understanding and is in agreement w/ plan.

## 2014-06-07 NOTE — Assessment & Plan Note (Signed)
Deteriorated since stopping PPI.  Restart Protonix.  Prescription sent.  Reviewed dietary and lifestyle modifications.  Pt expressed understanding and is in agreement w/ plan.

## 2014-06-18 ENCOUNTER — Encounter: Payer: Self-pay | Admitting: Cardiology

## 2014-06-19 ENCOUNTER — Ambulatory Visit: Payer: 59 | Admitting: Pulmonary Disease

## 2014-06-19 ENCOUNTER — Ambulatory Visit (INDEPENDENT_AMBULATORY_CARE_PROVIDER_SITE_OTHER): Payer: 59 | Admitting: Primary Care

## 2014-06-19 ENCOUNTER — Encounter: Payer: Self-pay | Admitting: Primary Care

## 2014-06-19 VITALS — BP 160/88 | HR 93 | Temp 98.6°F | Resp 16 | Wt 178.6 lb

## 2014-06-19 DIAGNOSIS — K529 Noninfective gastroenteritis and colitis, unspecified: Secondary | ICD-10-CM | POA: Diagnosis not present

## 2014-06-19 LAB — CBC WITH DIFFERENTIAL/PLATELET
Basophils Absolute: 0 10*3/uL (ref 0.0–0.1)
Basophils Relative: 0.6 % (ref 0.0–3.0)
Eosinophils Absolute: 0 10*3/uL (ref 0.0–0.7)
Eosinophils Relative: 0.7 % (ref 0.0–5.0)
HEMATOCRIT: 37.6 % (ref 36.0–46.0)
HEMOGLOBIN: 12.4 g/dL (ref 12.0–15.0)
LYMPHS ABS: 0.8 10*3/uL (ref 0.7–4.0)
LYMPHS PCT: 15.2 % (ref 12.0–46.0)
MCHC: 33 g/dL (ref 30.0–36.0)
MCV: 85.5 fl (ref 78.0–100.0)
Monocytes Absolute: 0.4 10*3/uL (ref 0.1–1.0)
Monocytes Relative: 7.7 % (ref 3.0–12.0)
Neutro Abs: 4 10*3/uL (ref 1.4–7.7)
Neutrophils Relative %: 75.8 % (ref 43.0–77.0)
Platelets: 287 10*3/uL (ref 150.0–400.0)
RBC: 4.4 Mil/uL (ref 3.87–5.11)
RDW: 13.4 % (ref 11.5–15.5)
WBC: 5.3 10*3/uL (ref 4.0–10.5)

## 2014-06-19 LAB — BASIC METABOLIC PANEL
BUN: 14 mg/dL (ref 6–23)
CALCIUM: 9.2 mg/dL (ref 8.4–10.5)
CO2: 29 mEq/L (ref 19–32)
CREATININE: 0.67 mg/dL (ref 0.40–1.20)
Chloride: 104 mEq/L (ref 96–112)
GFR: 119.96 mL/min (ref >60.00)
Glucose, Bld: 128 mg/dL — ABNORMAL HIGH (ref 70–99)
Potassium: 3.6 mEq/L (ref 3.5–5.1)
Sodium: 139 mEq/L (ref 135–145)

## 2014-06-19 MED ORDER — ONDANSETRON HCL 4 MG/2ML IJ SOLN
4.0000 mg | Freq: Once | INTRAMUSCULAR | Status: AC
  Administered 2014-06-19: 4 mg via INTRAMUSCULAR

## 2014-06-19 MED ORDER — ONDANSETRON 4 MG PO TBDP: 4.0000 mg | ORAL_TABLET | Freq: Three times a day (TID) | ORAL | Status: DC | PRN

## 2014-06-19 NOTE — Patient Instructions (Addendum)
Complete lab work prior to leaving. Check your blood sugars this afternoon and evening. You may decrease your Lantus to 14 units today. If able to tolerate normal diet you may resume regular dose tomorrow. Call if sugars are less than 80 or greater than 300. Attempt to stay hydrated with clear liquids such as water, ginger ale and advance diet as tolerated. Take Zofran three times daily as needed for vomiting. We hope you feel better soon!

## 2014-06-19 NOTE — Progress Notes (Signed)
Pre visit review using our clinic review tool, if applicable. No additional management support is needed unless otherwise documented below in the visit note. 

## 2014-06-19 NOTE — Progress Notes (Signed)
Subjective:    Patient ID: Crystal Peck, female    DOB: 12-05-1965, 49 y.o.   MRN: [id number]  HPI  Crystal Peck is a 49 year old female who presents today with a chief complaint of nausea, vomiting, abdominal discomfort, and diarrhea that started yesterday afternoon around 4pm. She has vomited greater than 10 times with constant nausea and has had 4 episodes of diarrhea. Her abdominal pain is generalized with a cramping/dull sensation. She attempted to eat Jello and Ginger Ale last night which resulted in vomiting soon after. Vomiting makes her feel better, eating makes symptoms worse. She reports to be feeling slightly better than she did yesterday afternoon. She has not taken any OTC medications.   Review of Systems  Constitutional: Positive for chills and fatigue. Negative for fever.  HENT: Negative for rhinorrhea and sore throat.   Respiratory: Negative for cough and shortness of breath.   Cardiovascular: Negative for chest pain.  Gastrointestinal: Positive for nausea, vomiting, abdominal pain and diarrhea. Negative for blood in stool.  Genitourinary: Negative for dysuria.  Neurological: Negative for dizziness.   Past Medical History  Diagnosis Date  . Asthma   . Diabetes mellitus   . Hyperlipidemia   . Hypertension   . GERD (gastroesophageal reflux disease)   . Cerebellar tumor   . Obesity   . Sleep apnea   . A-fib   . Thyroid disease   . Chest pain 03/27/2014    negative lexiscan myoview    History   Social History  . Marital Status: Legally Separated    Spouse Name: N/A  . Number of Children: N/A  . Years of Education: N/A   Occupational History  . Not on file.   Social History Main Topics  . Smoking status: Never Smoker   . Smokeless tobacco: Never Used  . Alcohol Use: No  . Drug Use: No  . Sexual Activity: Not on file   Other Topics Concern  . Not on file   Social History Narrative    Past Surgical History  Procedure Laterality Date  .  Cholecystectomy    . Partial hysterectomy      ovaries remain  . Nasal sinus surgery      Dr. Lyn Hollingshead  . Abdominal hysterectomy      Family History  Problem Relation Age of Onset  . Coronary artery disease Father   . Hypertension Father   . Diabetes      maternal grandparents  . Hyperlipidemia Other     No Known Allergies  Current Outpatient Prescriptions on File Prior to Visit  Medication Sig Dispense Refill  . albuterol (PROVENTIL HFA;VENTOLIN HFA) 108 (90 BASE) MCG/ACT inhaler Inhale 2 puffs into the lungs every 4 (four) hours as needed for wheezing or shortness of breath.    Marland Kitchen atenolol (TENORMIN) 100 MG tablet Take 1 tablet (100 mg total) by mouth every evening. 90 tablet 0  . atorvastatin (LIPITOR) 40 MG tablet TAKE 1 TABLET BY MOUTH DAILY 30 tablet 3  . azelastine (OPTIVAR) 0.05 % ophthalmic solution Place 1 drop into both eyes 2 (two) times daily.     . beclomethasone (QVAR) 80 MCG/ACT inhaler Inhale 1 puff into the lungs 2 (two) times daily as needed (asthma).    . cetirizine (ZYRTEC) 10 MG tablet Take 1 tablet (10 mg total) by mouth daily. 30 tablet 3  . glipiZIDE (GLUCOTROL XL) 5 MG 24 hr tablet Take 5 mg by mouth daily with breakfast.    .  glucose blood test strip 1 each by Other route as needed. Onetouch ultra test strip     . INVOKANA 100 MG TABS tablet Take 1 tablet by mouth daily.  3  . IRON PO Take 2 tablets by mouth 3 (three) times a week.     Marland Kitchen LANTUS SOLOSTAR 100 UNIT/ML Solostar Pen Inject 28 Units into the skin every morning. AS DIRECTED  5  . lisinopril (PRINIVIL,ZESTRIL) 5 MG tablet Take 5 mg by mouth daily.    . metFORMIN (GLUCOPHAGE) 1000 MG tablet Take 1,000 mg by mouth 2 (two) times daily with a meal.    . methimazole (TAPAZOLE) 5 MG tablet Take 7.5 mg by mouth every evening.   2  . Multiple Vitamin (MULTIVITAMIN WITH MINERALS) TABS tablet Take 1 tablet by mouth every evening.     . Omega-3 Fatty Acids (FISH OIL) 1000 MG CAPS Take 2,000 mg by mouth  every evening.     . pantoprazole (PROTONIX) 40 MG tablet Take 1 tablet (40 mg total) by mouth daily. 90 tablet 3  . potassium chloride SA (K-DUR,KLOR-CON) 20 MEQ tablet Take 20 mEq by mouth daily as needed (for leg cramping). Take one tablet by mouth daily.    . rivaroxaban (XARELTO) 20 MG TABS tablet Take 1 tablet (20 mg total) by mouth daily with supper. 30 tablet 3   No current facility-administered medications on file prior to visit.    BP 160/88 mmHg  Pulse 93  Temp(Src) 98.6 F (37 C) (Oral)  Resp 16  Wt 178 lb 9.6 oz (81.012 kg)  SpO2 99%       Objective:   Physical Exam  Constitutional: She is oriented to person, place, and time. She appears well-developed. She appears distressed.  HENT:  Head: Normocephalic.  Neck: Neck supple.  Cardiovascular: Normal rate.   Pulmonary/Chest: Effort normal and breath sounds normal.  Abdominal: Soft. Bowel sounds are normal. She exhibits no distension and no mass. There is no tenderness. There is no guarding.  Lymphadenopathy:    She has no cervical adenopathy.  Neurological: She is alert and oriented to person, place, and time.  Skin: Skin is warm and dry.  Psychiatric: She has a normal mood and affect.          Assessment & Plan:  Suspect this is a viral gastroenteritis. Will encourage clear liquids and advance diet as tolerated. Discussed sick days with patient regarding insulin dose and to ensure she checks her blood sugar prior to inulin administration. RX for Zofran to use as needed for nausea. CBC and BMET ordered to check electrolytes, glucose, and for possible WBC count.

## 2014-06-28 ENCOUNTER — Other Ambulatory Visit: Payer: Self-pay | Admitting: General Practice

## 2014-06-28 ENCOUNTER — Telehealth: Payer: Self-pay | Admitting: General Practice

## 2014-06-28 NOTE — Telephone Encounter (Signed)
Received a fax from [facility] for a refill on Pt's Xarelto. Records show that it was last filled 02/15/14 #30 with 3. Dr. Birdie Riddle advised that this should be sent to your office to ensure that she has been taking this on a regular. As Medication should have run out before February.

## 2014-06-28 NOTE — Telephone Encounter (Signed)
Please find out if patient has missed doses of Xarelto

## 2014-06-29 ENCOUNTER — Telehealth: Payer: Self-pay

## 2014-06-29 NOTE — Telephone Encounter (Signed)
Left a message for call back.  

## 2014-06-29 NOTE — Telephone Encounter (Signed)
Left message to call back  

## 2014-07-02 ENCOUNTER — Ambulatory Visit (INDEPENDENT_AMBULATORY_CARE_PROVIDER_SITE_OTHER): Payer: 59 | Admitting: Family Medicine

## 2014-07-02 ENCOUNTER — Encounter: Payer: Self-pay | Admitting: Family Medicine

## 2014-07-02 VITALS — BP 142/90 | HR 86 | Temp 98.0°F | Resp 16 | Ht 61.0 in | Wt 183.5 lb

## 2014-07-02 DIAGNOSIS — H1013 Acute atopic conjunctivitis, bilateral: Secondary | ICD-10-CM

## 2014-07-02 DIAGNOSIS — J309 Allergic rhinitis, unspecified: Secondary | ICD-10-CM

## 2014-07-02 DIAGNOSIS — Z1211 Encounter for screening for malignant neoplasm of colon: Secondary | ICD-10-CM

## 2014-07-02 DIAGNOSIS — Z0000 Encounter for general adult medical examination without abnormal findings: Secondary | ICD-10-CM

## 2014-07-02 MED ORDER — AZELASTINE HCL 0.05 % OP SOLN: 1.0000 [drp] | Freq: Two times a day (BID) | OPHTHALMIC | Status: DC

## 2014-07-02 MED ORDER — CETIRIZINE HCL 10 MG PO TABS: 10.0000 mg | ORAL_TABLET | Freq: Every day | ORAL | Status: DC

## 2014-07-02 MED ORDER — RIVAROXABAN 20 MG PO TABS: 20.0000 mg | ORAL_TABLET | Freq: Every day | ORAL | Status: DC

## 2014-07-02 NOTE — Progress Notes (Signed)
Pre visit review using our clinic review tool, if applicable. No additional management support is needed unless otherwise documented below in the visit note. 

## 2014-07-02 NOTE — Progress Notes (Signed)
   Subjective:    Patient ID: Crystal Peck, female    DOB: 1965/08/10, 49 y.o.   MRN: [id number]  HPI CPE- UTD on mammo.  Due for colonoscopy starting in June.  No need for pap due to hysterectomy.  Following w/ Endo.  Pt is out of Xarelto- needs refill.   Review of Systems Patient reports no vision/ hearing changes, adenopathy,fever, weight change,  persistant/recurrent hoarseness , swallowing issues, chest pain, palpitations, edema, persistant/recurrent cough, hemoptysis, dyspnea (rest/exertional/paroxysmal nocturnal), gastrointestinal bleeding (melena, rectal bleeding), abdominal pain, significant heartburn, bowel changes, GU symptoms (dysuria, hematuria, incontinence), Gyn symptoms (abnormal  bleeding, pain),  syncope, focal weakness, memory loss, numbness & tingling, skin/hair/nail changes, abnormal bruising or bleeding, anxiety, or depression.     Objective:   Physical Exam General Appearance:    Alert, cooperative, no distress, appears stated age  Head:    Normocephalic, without obvious abnormality, atraumatic  Eyes:    PERRL, conjunctiva/corneas clear, EOM's intact, fundi    benign, both eyes  Ears:    Normal TM's and external ear canals, both ears  Nose:   Nares normal, septum midline, mucosa normal, no drainage    or sinus tenderness  Throat:   Lips, mucosa, and tongue normal; teeth and gums normal  Neck:   Supple, symmetrical, trachea midline, no adenopathy;    Thyroid: no enlargement/tenderness/nodules  Back:     Symmetric, no curvature, ROM normal, no CVA tenderness  Lungs:     Clear to auscultation bilaterally, respirations unlabored  Chest Wall:    No tenderness or deformity   Heart:    Regular rate and rhythm, S1 and S2 normal, no murmur, rub   or gallop  Breast Exam:    Deferred to mammo  Abdomen:     Soft, non-tender, bowel sounds active all four quadrants,    no masses, no organomegaly  Genitalia:    Deferred due to hysterectomy  Rectal:    Extremities:    Extremities normal, atraumatic, no cyanosis or edema  Pulses:   2+ and symmetric all extremities  Skin:   Skin color, texture, turgor normal, no rashes or lesions  Lymph nodes:   Cervical, supraclavicular, and axillary nodes normal  Neurologic:   CNII-XII intact, normal strength, sensation and reflexes    throughout          Assessment & Plan:

## 2014-07-02 NOTE — Patient Instructions (Signed)
Follow up in 6 months to recheck BP and cholesterol We'll notify you of your lab results and make any changes if needed Keep up the good work on healthy diet and regular exercise Call with any questions or concerns Happy Spring!!!

## 2014-07-02 NOTE — Assessment & Plan Note (Signed)
Pt's PE WNL w/ exception of obesity.  UTD on mammo.  No need for pap due to hysterectomy.  Following w/ Endo for diabetes.  UTD on eye exam.  Check labs.  Anticipatory guidance provided.

## 2014-07-03 ENCOUNTER — Encounter: Payer: Self-pay | Admitting: General Practice

## 2014-07-03 ENCOUNTER — Telehealth: Payer: Self-pay | Admitting: Family Medicine

## 2014-07-03 LAB — HEPATIC FUNCTION PANEL
ALBUMIN: 3.9 g/dL (ref 3.5–5.2)
ALT: 37 U/L — ABNORMAL HIGH (ref 0–35)
AST: 18 U/L (ref 0–37)
Alkaline Phosphatase: 102 U/L (ref 39–117)
Bilirubin, Direct: 0.1 mg/dL (ref 0.0–0.3)
Total Bilirubin: 0.3 mg/dL (ref 0.2–1.2)
Total Protein: 6.9 g/dL (ref 6.0–8.3)

## 2014-07-03 LAB — BASIC METABOLIC PANEL
BUN: 16 mg/dL (ref 6–23)
CALCIUM: 9.3 mg/dL (ref 8.4–10.5)
CO2: 31 mEq/L (ref 19–32)
Chloride: 103 mEq/L (ref 96–112)
Creatinine, Ser: 0.75 mg/dL (ref 0.40–1.20)
GFR: 105.3 mL/min (ref >60.00)
GLUCOSE: 203 mg/dL — AB (ref 70–99)
Potassium: 3.7 mEq/L (ref 3.5–5.1)
SODIUM: 140 meq/L (ref 135–145)

## 2014-07-03 LAB — LIPID PANEL
Cholesterol: 155 mg/dL (ref 0–200)
HDL: 39.6 mg/dL (ref >39.00)
LDL Cholesterol: 95 mg/dL (ref 0–99)
NONHDL: 115.4
Total CHOL/HDL Ratio: 4
Triglycerides: 100 mg/dL (ref 0.0–149.0)
VLDL: 20 mg/dL (ref 0.0–40.0)

## 2014-07-03 LAB — CBC WITH DIFFERENTIAL/PLATELET
Basophils Absolute: 0.2 10*3/uL — ABNORMAL HIGH (ref 0.0–0.1)
Basophils Relative: 3.6 % — ABNORMAL HIGH (ref 0.0–3.0)
EOS ABS: 0.1 10*3/uL (ref 0.0–0.7)
EOS PCT: 2.2 % (ref 0.0–5.0)
HEMATOCRIT: 34.2 % — AB (ref 36.0–46.0)
Hemoglobin: 11.2 g/dL — ABNORMAL LOW (ref 12.0–15.0)
LYMPHS ABS: 2 10*3/uL (ref 0.7–4.0)
Lymphocytes Relative: 39.9 % (ref 12.0–46.0)
MCHC: 32.8 g/dL (ref 30.0–36.0)
MCV: 86.1 fl (ref 78.0–100.0)
MONO ABS: 0.3 10*3/uL (ref 0.1–1.0)
Monocytes Relative: 6.5 % (ref 3.0–12.0)
Neutro Abs: 2.4 10*3/uL (ref 1.4–7.7)
Neutrophils Relative %: 47.8 % (ref 43.0–77.0)
PLATELETS: 295 10*3/uL (ref 150.0–400.0)
RBC: 3.98 Mil/uL (ref 3.87–5.11)
RDW: 13.1 % (ref 11.5–15.5)
WBC: 5 10*3/uL (ref 4.0–10.5)

## 2014-07-03 LAB — TSH: TSH: 0.23 u[IU]/mL — ABNORMAL LOW (ref 0.35–4.50)

## 2014-07-03 LAB — VITAMIN D 25 HYDROXY (VIT D DEFICIENCY, FRACTURES): VITD: 9.32 ng/mL — ABNORMAL LOW (ref 30.00–100.00)

## 2014-07-03 NOTE — Telephone Encounter (Signed)
Caller name: Mikinzie, Maciejewski Relation to pt: self  Call back number: [phone] Pharmacy: [facility], [place] [phone] (Phone) [phone] (Fax)        Reason for call:  Pt requesting a refill lisinopril (PRINIVIL,ZESTRIL) 5 MG tablet

## 2014-07-04 ENCOUNTER — Other Ambulatory Visit: Payer: Self-pay | Admitting: General Practice

## 2014-07-04 ENCOUNTER — Ambulatory Visit (INDEPENDENT_AMBULATORY_CARE_PROVIDER_SITE_OTHER): Payer: Self-pay | Admitting: Family Medicine

## 2014-07-04 VITALS — BP 168/71 | HR 73 | Ht 61.75 in | Wt 185.0 lb

## 2014-07-04 DIAGNOSIS — E119 Type 2 diabetes mellitus without complications: Secondary | ICD-10-CM

## 2014-07-04 MED ORDER — LISINOPRIL 5 MG PO TABS: 5.0000 mg | ORAL_TABLET | Freq: Every day | ORAL | Status: DC

## 2014-07-04 MED ORDER — VITAMIN D (ERGOCALCIFEROL) 1.25 MG (50000 UNIT) PO CAPS: 50000.0000 [IU] | ORAL_CAPSULE | ORAL | Status: DC

## 2014-07-04 NOTE — Progress Notes (Signed)
Patient presents today for DM follow-up as part of employer-sponsored Link to [employer]. Medications, glucose readings, a1c and compliance have been reviewed. I have also discussed with patient lifestyle interventions including diet and exercise. Details of the visit can be found in [employer] documenting program through [facility]). Patient has set a series of personal goals and will follow-up in no more than 3 months for further review of DM.  A: Mrs. Shugart has a good working understanding of the diabetic disease state that will continue to need sharpening. She was mostly familiar with the notion of carbohydrates and could give an accurate answer for what portion of her meals were carbohydrate-heavy. She still misses a few more difficult concepts such as moderation even in "good" foods like fruits or corn. She understands carb counting and what her medications do. A hospital encounter in late 5784 seems to have elicited a desire to improve her quality of care. She has a good team of MDs following her but she has always been a bit hesitant and/or noncompliant. She is not unintelligent nor lazy, but she seemed in the past to not value the importance of her health care; that seems to have changed. She has recently improved her quality and quantity of food choices. P: 1) Get a new meter and start checking blood glucoses regularly (Dr. Birdie Riddle suggested every morning before eating) 2) I see no lisinopril filled for 14 months. I will contact Dr. Birdie Riddle to see if this fell off the medication list intentionally or not. - Keep a list of medications that you are actively taking. Especially now that you are on Xarelto, this will be important should an emergency arise. Highlight Xarelto on the list. - General noncompliance issues persist, but to a lesser extent/degree. 3) Patient Goals: Eat fewer carbs to improve health. Improve selections, cut out carbohydrates. More vegetables. 4) Reduce  Blood Pressure (lisinopril addition will help); monitor and follow up with Dr. Birdie Riddle in 2 weeks if it remains elevated f/u in 3 months

## 2014-07-04 NOTE — Telephone Encounter (Signed)
Med filled.  

## 2014-07-05 NOTE — Telephone Encounter (Signed)
Left message to call back  

## 2014-07-05 NOTE — Telephone Encounter (Signed)
Unable to reach patient prior to visit  

## 2014-07-06 NOTE — Telephone Encounter (Signed)
Left message for patient to call back.  Letter sent as well.

## 2014-07-09 NOTE — Progress Notes (Signed)
Patient ID: Crystal Peck, female   DOB: Mar 02, 1966, 49 y.o.   MRN: [id number] ATTENDING PHYSICIAN NOTE: I have reviewed the chart and agree with the plan as detailed above. Dorcas Mcmurray MD Pager [phone]

## 2014-07-13 ENCOUNTER — Telehealth: Payer: Self-pay | Admitting: Cardiology

## 2014-07-13 NOTE — Telephone Encounter (Signed)
OV made per patient request.

## 2014-07-13 NOTE — Telephone Encounter (Signed)
New MEssage  Pt calling to speak w/ Katy. Wanted to speak w/ Rn before making an appointment. Please call back and discuss.

## 2014-07-13 NOTE — Telephone Encounter (Signed)
Left message to call back  

## 2014-07-23 NOTE — Progress Notes (Signed)
Patient ID: Crystal Peck, female   DOB: 07-21-1965, 49 y.o.   MRN: [id number] Reviewed: Agree with the documentation and management of our [place].

## 2014-07-27 LAB — HEMOGLOBIN A1C: Hgb A1c MFr Bld: 7.5 % — AB (ref 4.0–6.0)

## 2014-08-03 ENCOUNTER — Encounter: Payer: Self-pay | Admitting: General Practice

## 2014-08-09 ENCOUNTER — Telehealth: Payer: Self-pay | Admitting: Family Medicine

## 2014-08-09 NOTE — Telephone Encounter (Signed)
Caller name: Dwyane Luo from [employer] back number: [phone] and fax [phone]   Reason for call:  As per [facility]ologist  2nd request regarding a referral for patient due to having thyroid  Issues.

## 2014-08-09 NOTE — Telephone Encounter (Signed)
Please advise pt has [employer] and sees Dr. Posey Pronto at [facility].

## 2014-08-13 NOTE — Telephone Encounter (Signed)
Endo was only needed most recent labs, labs faxed

## 2014-09-06 ENCOUNTER — Encounter (HOSPITAL_COMMUNITY): Payer: Self-pay | Admitting: Physical Medicine and Rehabilitation

## 2014-09-06 ENCOUNTER — Emergency Department (HOSPITAL_COMMUNITY)
Admission: EM | Admit: 2014-09-06 | Discharge: 2014-09-06 | Disposition: A | Discharge status: Home / Self Care | Payer: 59 | Attending: Emergency Medicine | Admitting: Emergency Medicine

## 2014-09-06 ENCOUNTER — Emergency Department (HOSPITAL_COMMUNITY): Payer: 59

## 2014-09-06 DIAGNOSIS — K219 Gastro-esophageal reflux disease without esophagitis: Secondary | ICD-10-CM | POA: Diagnosis not present

## 2014-09-06 DIAGNOSIS — Z7901 Long term (current) use of anticoagulants: Secondary | ICD-10-CM | POA: Diagnosis not present

## 2014-09-06 DIAGNOSIS — E669 Obesity, unspecified: Secondary | ICD-10-CM | POA: Insufficient documentation

## 2014-09-06 DIAGNOSIS — I10 Essential (primary) hypertension: Secondary | ICD-10-CM | POA: Diagnosis not present

## 2014-09-06 DIAGNOSIS — Z86018 Personal history of other benign neoplasm: Secondary | ICD-10-CM | POA: Diagnosis not present

## 2014-09-06 DIAGNOSIS — Z79899 Other long term (current) drug therapy: Secondary | ICD-10-CM | POA: Diagnosis not present

## 2014-09-06 DIAGNOSIS — R1030 Lower abdominal pain, unspecified: Secondary | ICD-10-CM | POA: Diagnosis not present

## 2014-09-06 DIAGNOSIS — E119 Type 2 diabetes mellitus without complications: Secondary | ICD-10-CM | POA: Diagnosis not present

## 2014-09-06 DIAGNOSIS — M546 Pain in thoracic spine: Secondary | ICD-10-CM | POA: Diagnosis not present

## 2014-09-06 DIAGNOSIS — Z8669 Personal history of other diseases of the nervous system and sense organs: Secondary | ICD-10-CM | POA: Insufficient documentation

## 2014-09-06 DIAGNOSIS — J45909 Unspecified asthma, uncomplicated: Secondary | ICD-10-CM | POA: Insufficient documentation

## 2014-09-06 DIAGNOSIS — R109 Unspecified abdominal pain: Secondary | ICD-10-CM

## 2014-09-06 LAB — URINALYSIS, ROUTINE W REFLEX MICROSCOPIC
Bilirubin Urine: NEGATIVE
Glucose, UA: 250 mg/dL — AB
Ketones, ur: NEGATIVE mg/dL
Leukocytes, UA: NEGATIVE
Nitrite: NEGATIVE
Protein, ur: 30 mg/dL — AB
Specific Gravity, Urine: 1.012 (ref 1.005–1.030)
Urobilinogen, UA: 1 mg/dL (ref 0.0–1.0)
pH: 6.5 (ref 5.0–8.0)

## 2014-09-06 LAB — COMPREHENSIVE METABOLIC PANEL
ALT: 40 U/L (ref 14–54)
AST: 28 U/L (ref 15–41)
Albumin: 3.4 g/dL — ABNORMAL LOW (ref 3.5–5.0)
Alkaline Phosphatase: 101 U/L (ref 38–126)
Anion gap: 8 (ref 5–15)
BUN: 19 mg/dL (ref 6–20)
CO2: 25 mmol/L (ref 22–32)
Calcium: 9 mg/dL (ref 8.9–10.3)
Chloride: 105 mmol/L (ref 101–111)
Creatinine, Ser: 0.62 mg/dL (ref 0.44–1.00)
GFR calc Af Amer: >60 mL/min (ref >60)
GFR calc non Af Amer: >60 mL/min (ref >60)
Glucose, Bld: 171 mg/dL — ABNORMAL HIGH (ref 65–99)
Potassium: 4 mmol/L (ref 3.5–5.1)
Sodium: 138 mmol/L (ref 135–145)
Total Bilirubin: 0.4 mg/dL (ref 0.3–1.2)
Total Protein: 6.6 g/dL (ref 6.5–8.1)

## 2014-09-06 LAB — URINE MICROSCOPIC-ADD ON

## 2014-09-06 LAB — CBC WITH DIFFERENTIAL/PLATELET
Basophils Absolute: 0.1 10*3/uL (ref 0.0–0.1)
Basophils Relative: 1 % (ref 0–1)
Eosinophils Absolute: 0.1 10*3/uL (ref 0.0–0.7)
Eosinophils Relative: 2 % (ref 0–5)
HCT: 35.3 % — ABNORMAL LOW (ref 36.0–46.0)
Hemoglobin: 11.5 g/dL — ABNORMAL LOW (ref 12.0–15.0)
Lymphocytes Relative: 39 % (ref 12–46)
Lymphs Abs: 1.8 10*3/uL (ref 0.7–4.0)
MCH: 28.3 pg (ref 26.0–34.0)
MCHC: 32.6 g/dL (ref 30.0–36.0)
MCV: 86.9 fL (ref 78.0–100.0)
Monocytes Absolute: 0.3 10*3/uL (ref 0.1–1.0)
Monocytes Relative: 6 % (ref 3–12)
Neutro Abs: 2.4 10*3/uL (ref 1.7–7.7)
Neutrophils Relative %: 52 % (ref 43–77)
Platelets: 265 10*3/uL (ref 150–400)
RBC: 4.06 MIL/uL (ref 3.87–5.11)
RDW: 12.9 % (ref 11.5–15.5)
WBC: 4.6 10*3/uL (ref 4.0–10.5)

## 2014-09-06 LAB — LIPASE, BLOOD: Lipase: 33 U/L (ref 22–51)

## 2014-09-06 MED ORDER — IOHEXOL 300 MG/ML  SOLN
25.0000 mL | Freq: Once | INTRAMUSCULAR | Status: AC | PRN
  Administered 2014-09-06: 25 mL via ORAL

## 2014-09-06 MED ORDER — MORPHINE SULFATE 4 MG/ML IJ SOLN: 4.0000 mg | Freq: Once | INTRAMUSCULAR | Status: DC

## 2014-09-06 MED ORDER — IOHEXOL 300 MG/ML  SOLN
80.0000 mL | Freq: Once | INTRAMUSCULAR | Status: AC | PRN
  Administered 2014-09-06: 80 mL via INTRAVENOUS

## 2014-09-06 NOTE — Discharge Instructions (Signed)
° ° ° ° °  Return here as needed.  Follow-up with your primary care doctor, increase her fluid intake and rest as much as possible        Metformin and X-ray Contrast Studies For some X-ray exams, a contrast dye is used. Contrast dye is a type of medicine used to make the X-ray image clearer. The contrast dye is given to the patient through a vein (intravenously). If you need to have this type of X-ray exam and you take a medication called metformin, your caregiver may have you stop taking metformin before the exam.  LACTIC ACIDOSIS In rare cases, a serious medical condition called lactic acidosis can develop in people who take metformin and receive contrast dye. The following conditions can increase the risk of this complication:   Kidney failure.  Liver problems.  Certain types of heart problems such as:  Heart failure.  Heart attack.  Heart infection.  Heart valve problems.  Alcohol abuse. If left untreated, lactic acidosis can lead to coma.  SYMPTOMS OF LACTIC ACIDOSIS Symptoms of lactic acidosis can include:  Rapid breathing (hyperventilation).  Neurologic symptoms such as:  Headaches.  Confusion.  Dizziness.  Excessive sweating.  Feeling sick to your stomach (nauseous) or throwing up (vomiting). AFTER THE X-RAY EXAM  Stay well-hydrated. Drink fluids as instructed by your caregiver.  If you have a risk of developing lactic acidosis, blood tests may be done to make sure your kidney function is okay.  Metformin is usually stopped for 48 hours after the X-ray exam. Ask your caregiver when you can start taking metformin again. SEEK MEDICAL CARE IF:   You have shortness of breath or difficulty breathing.  You develop a headache that does not go away.  You have nausea or vomiting.  You urinate more than normal.  You develop a skin rash and have:  Redness.  Swelling.  Itching. Document Released: 03/25/2009 Document Revised: 06/29/2011 Document  Reviewed: 03/25/2009 [facility] Patient Information 2015 [place], [place]. This information is not intended to replace advice given to you by your health care provider. Make sure you discuss any questions you have with your health care provider.

## 2014-09-06 NOTE — ED Notes (Addendum)
Pt ambulated hall with a quick and steady pace.  HR remained between 104 - 110.  Notified Lawyer, [place].

## 2014-09-06 NOTE — ED Provider Notes (Signed)
CSN: [id number]     Arrival date & time 09/06/14  8502 History   First MD Initiated Contact with Patient 09/06/14 [phone]     Chief Complaint  Patient presents with  . Shortness of Breath  . Back Pain  . Flank Pain     (Consider location/radiation/quality/duration/timing/severity/associated sxs/prior Treatment) HPI Patient presents to the emergency department with back pain and right flank pain.  She also states that today she had some shortness of breath.  The patient states that symptoms of an ongoing for 1 week.  She is also complaining of right flank pain radiating to her back with some nausea.  The patient states she does not have any chest pain, weakness, dizziness, headache, blurred vision, diarrhea, vomiting, fever, cough, rash, or syncope.  The patient states that she did not take any medications prior to arrival.  Patient states that she was working when these symptoms occurred.  She states the shortness of breath started this morning while walking at work.  The patient was concerned that she was having an episode of atrial fibrillation which she has had in the past. Past Medical History  Diagnosis Date  . Asthma   . Diabetes mellitus   . Hyperlipidemia   . Hypertension   . GERD (gastroesophageal reflux disease)   . Cerebellar tumor   . Obesity   . Sleep apnea   . A-fib   . Thyroid disease   . Chest pain 03/27/2014    negative lexiscan myoview   Past Surgical History  Procedure Laterality Date  . Cholecystectomy    . Partial hysterectomy      ovaries remain  . Nasal sinus surgery      Dr. Lyn Hollingshead  . Abdominal hysterectomy     Family History  Problem Relation Age of Onset  . Coronary artery disease Father   . Hypertension Father   . Diabetes      maternal grandparents  . Hyperlipidemia Other    History  Substance Use Topics  . Smoking status: Never Smoker   . Smokeless tobacco: Never Used  . Alcohol Use: No   OB History    No data available     Review  of Systems All other systems negative except as documented in the HPI. All pertinent positives and negatives as reviewed in the HPI.   Allergies  Review of patient's allergies indicates no known allergies.  Home Medications   Prior to Admission medications   Medication Sig Start Date End Date Taking? Authorizing Provider  albuterol (PROVENTIL HFA;VENTOLIN HFA) 108 (90 BASE) MCG/ACT inhaler Inhale 2 puffs into the lungs every 4 (four) hours as needed for wheezing or shortness of breath.    Historical Provider, MD  atenolol (TENORMIN) 100 MG tablet Take 1 tablet (100 mg total) by mouth every evening. 05/22/14   Midge Minium, MD  atorvastatin (LIPITOR) 40 MG tablet TAKE 1 TABLET BY MOUTH DAILY 05/28/14   Midge Minium, MD  azelastine (OPTIVAR) 0.05 % ophthalmic solution Place 1 drop into both eyes 2 (two) times daily. 07/02/14   Midge Minium, MD  cetirizine (ZYRTEC) 10 MG tablet Take 1 tablet (10 mg total) by mouth daily. 07/02/14   Midge Minium, MD  glipiZIDE (GLUCOTROL XL) 10 MG 24 hr tablet Take 10 mg by mouth daily with breakfast.    Historical Provider, MD  glucose blood test strip 1 each by Other route as needed. Onetouch ultra test strip     Historical Provider, MD  INVOKANA 100 MG TABS tablet Take 1 tablet by mouth daily. 02/27/14   Historical Provider, MD  IRON PO Take 2 tablets by mouth 3 (three) times a week.     Historical Provider, MD  LANTUS SOLOSTAR 100 UNIT/ML Solostar Pen Inject 28 Units into the skin every morning. AS DIRECTED 02/27/14   Historical Provider, MD  lisinopril (PRINIVIL,ZESTRIL) 5 MG tablet Take 1 tablet (5 mg total) by mouth daily. 07/04/14   Midge Minium, MD  metFORMIN (GLUCOPHAGE) 1000 MG tablet Take 1,000 mg by mouth 2 (two) times daily with a meal.    Historical Provider, MD  methimazole (TAPAZOLE) 5 MG tablet Take 7.5 mg by mouth every evening.  02/27/14   Historical Provider, MD  Multiple Vitamin (MULTIVITAMIN WITH MINERALS) TABS tablet  Take 1 tablet by mouth every evening.     Historical Provider, MD  Omega-3 Fatty Acids (FISH OIL) 1000 MG CAPS Take 2,000 mg by mouth every evening.     Historical Provider, MD  ondansetron (ZOFRAN ODT) 4 MG disintegrating tablet Take 1 tablet (4 mg total) by mouth every 8 (eight) hours as needed for nausea or vomiting. 06/19/14   Pleas Koch, NP  pantoprazole (PROTONIX) 40 MG tablet Take 1 tablet (40 mg total) by mouth daily. 06/07/14   Midge Minium, MD  potassium chloride SA (K-DUR,KLOR-CON) 20 MEQ tablet Take 20 mEq by mouth daily as needed (for leg cramping). Take one tablet by mouth daily. 02/26/12   Midge Minium, MD  rivaroxaban (XARELTO) 20 MG TABS tablet Take 1 tablet (20 mg total) by mouth daily with supper. 07/02/14   Midge Minium, MD  Vitamin D, Ergocalciferol, (DRISDOL) 50000 UNITS CAPS capsule Take 1 capsule (50,000 Units total) by mouth every 7 (seven) days. 07/04/14   Midge Minium, MD   BP 181/80 mmHg  Pulse 98  Temp(Src) 98.8 F (37.1 C) (Oral)  Resp 18  Ht 5\' 1"  (1.549 m)  Wt 180 lb (81.647 kg)  BMI 34.03 kg/m2  SpO2 99% Physical Exam  Constitutional: She is oriented to person, place, and time. She appears well-developed and well-nourished. No distress.  HENT:  Head: Normocephalic and atraumatic.  Mouth/Throat: Oropharynx is clear and moist.  Eyes: Pupils are equal, round, and reactive to light.  Neck: Normal range of motion. Neck supple.  Cardiovascular: Normal rate, regular rhythm and normal heart sounds.  Exam reveals no gallop and no friction rub.   No murmur heard. Pulmonary/Chest: Effort normal and breath sounds normal. No respiratory distress.  Abdominal: Soft. Bowel sounds are normal. She exhibits no distension. There is no tenderness.  Musculoskeletal: She exhibits no edema.  Neurological: She is alert and oriented to person, place, and time. She exhibits normal muscle tone. Coordination normal.  Skin: Skin is warm and dry. No rash  noted. No erythema.  Psychiatric: She has a normal mood and affect. Her behavior is normal.  Nursing note and vitals reviewed.   ED Course  Procedures (including critical care time) Labs Review Labs Reviewed - No data to display  Imaging Review Dg Chest 2 View  09/06/2014   CLINICAL DATA:  49 year old female with a history of right-sided chest pain and shortness of breath.  EXAM: CHEST - 2 VIEW  COMPARISON:  02/14/2014, 06/01/2013  FINDINGS: Cardiomediastinal silhouette projects within normal limits in size and contour. No confluent airspace disease, pneumothorax, or pleural effusion.  No displaced fracture.  Surgical changes of cholecystectomy  IMPRESSION: No radiographic evidence of acute cardiopulmonary  disease.  Signed,  Dulcy Fanny. Earleen Newport, DO  [facility]  [facility]   Electronically Signed   By: Corrie Mckusick D.O.   On: 09/06/2014 09:37   Ct Abdomen Pelvis W Contrast  09/06/2014   CLINICAL DATA:  Right-sided flank pain  EXAM: CT ABDOMEN AND PELVIS WITH CONTRAST  TECHNIQUE: Multidetector CT imaging of the abdomen and pelvis was performed using the standard protocol following bolus administration of intravenous contrast.  CONTRAST:  17mL OMNIPAQUE IOHEXOL 300 MG/ML SOLN, 15mL OMNIPAQUE IOHEXOL 300 MG/ML SOLN  COMPARISON:  None.  FINDINGS: Lung bases are free of acute infiltrate or sizable effusion.  The gallbladder has been surgically removed. The liver, spleen, adrenal glands and pancreas are all normal in their CT appearance. The kidneys are well visualized bilaterally with a normal enhancement pattern. No calculi or obstructive changes are seen.  Uterus has been surgically removed. The right colon and cecum are mobile lying in the mid abdomen. The appendix is within normal limits along the anterior abdominal wall. The bladder is well distended. No pelvic mass lesion is seen. No acute bony abnormality is noted.  IMPRESSION: No acute abnormality is  noted.   Electronically Signed   By: Inez Catalina M.D.   On: 09/06/2014 10:58   The patient is advised follow-up with her primary care Dr. told to return here as needed.  CT scan of chest x-rays did not show any abnormality.  Patient has not had any difficulty here in the emergency department with low pulse oximetry readings or low blood pressure.  Patient was ambulated in the hallway without difficulty.  The patient did not have any further shortness of breath or dizziness      Dalia Heading, PA-C 09/08/14 [id number]  Debby Freiberg, MD 09/12/14 [phone]

## 2014-09-06 NOTE — ED Notes (Signed)
Pt returned to room. Monitored by pulse ox, bp cuff, and 5-lead. 

## 2014-09-06 NOTE — ED Notes (Signed)
Patient transported to X-ray 

## 2014-09-06 NOTE — ED Notes (Signed)
Pt presents to department for evaluation of SOB, back pain and R sided flank pain. Also states nausea. Symptoms ongoing x1 week. History of atrial fibrillation. Denies chest pain at present. Pt is alert and oriented x4.

## 2014-09-06 NOTE — ED Notes (Signed)
Patient transported to CT 

## 2014-09-13 ENCOUNTER — Ambulatory Visit (INDEPENDENT_AMBULATORY_CARE_PROVIDER_SITE_OTHER): Payer: 59 | Admitting: Cardiology

## 2014-09-13 ENCOUNTER — Encounter: Payer: Self-pay | Admitting: Cardiology

## 2014-09-13 VITALS — BP 136/74 | HR 72 | Ht 61.0 in | Wt 196.4 lb

## 2014-09-13 DIAGNOSIS — I480 Paroxysmal atrial fibrillation: Secondary | ICD-10-CM | POA: Diagnosis not present

## 2014-09-13 DIAGNOSIS — E0590 Thyrotoxicosis, unspecified without thyrotoxic crisis or storm: Secondary | ICD-10-CM

## 2014-09-13 DIAGNOSIS — I10 Essential (primary) hypertension: Secondary | ICD-10-CM | POA: Diagnosis not present

## 2014-09-13 DIAGNOSIS — R079 Chest pain, unspecified: Secondary | ICD-10-CM

## 2014-09-13 MED ORDER — ATENOLOL 25 MG PO TABS: 12.5000 mg | ORAL_TABLET | Freq: Every day | ORAL | Status: DC | PRN

## 2014-09-13 MED ORDER — ATENOLOL 25 MG PO TABS: 25.0000 mg | ORAL_TABLET | Freq: Every day | ORAL | Status: DC | PRN

## 2014-09-13 NOTE — Progress Notes (Signed)
Cardiology Office Note   Date:  09/13/2014   ID:  Crystal Peck, DOB 15-Jul-1965, MRN [id number]  PCP:  [name], MD    Chief Complaint  Patient presents with  . Follow-up    PAF      History of Present Illness: Crystal Peck is a 49 y.o. female with a history of type II DM, HTN, dyslipidemia and GERD and PAF. She was started on Xarelto for CHADS2VASC score of 3. She has a history of hyperthyroidism.  She is doing well.  She denies any chest pain, SOB, DOE, LE edema, dizziness or syncope.  Last Thursday she had a brief episode of palpitations that lasted only 15 minutes.  She had been off of her CPAP but is now back on it.    Past Medical History  Diagnosis Date  . Asthma   . Diabetes mellitus   . Hyperlipidemia   . Hypertension   . GERD (gastroesophageal reflux disease)   . Cerebellar tumor   . Obesity   . Sleep apnea   . A-fib   . Thyroid disease   . Chest pain 03/27/2014    negative lexiscan myoview    Past Surgical History  Procedure Laterality Date  . Cholecystectomy    . Partial hysterectomy      ovaries remain  . Nasal sinus surgery      Dr. Lyn Hollingshead  . Abdominal hysterectomy       Current Outpatient Prescriptions  Medication Sig Dispense Refill  . albuterol (PROVENTIL HFA;VENTOLIN HFA) 108 (90 BASE) MCG/ACT inhaler Inhale 2 puffs into the lungs every 4 (four) hours as needed for wheezing or shortness of breath.    Marland Kitchen atenolol (TENORMIN) 100 MG tablet Take 1 tablet (100 mg total) by mouth every evening. 90 tablet 0  . atorvastatin (LIPITOR) 40 MG tablet TAKE 1 TABLET BY MOUTH DAILY 30 tablet 3  . azelastine (OPTIVAR) 0.05 % ophthalmic solution Place 1 drop into both eyes 2 (two) times daily. 6 mL 6  . cetirizine (ZYRTEC) 10 MG tablet Take 1 tablet (10 mg total) by mouth daily. 30 tablet 3  . glipiZIDE (GLUCOTROL XL) 10 MG 24 hr tablet Take 10 mg by mouth daily with breakfast.    . glucose blood test strip 1 each by Other route as needed.  Onetouch ultra test strip     . INVOKANA 100 MG TABS tablet Take 1 tablet by mouth daily.  3  . IRON PO Take 2 tablets by mouth 3 (three) times a week.     Marland Kitchen LANTUS SOLOSTAR 100 UNIT/ML Solostar Pen Inject 32 Units into the skin every morning. AS DIRECTED  5  . lisinopril (PRINIVIL,ZESTRIL) 5 MG tablet Take 1 tablet (5 mg total) by mouth daily. 30 tablet 6  . metFORMIN (GLUCOPHAGE) 1000 MG tablet Take 1,000 mg by mouth 2 (two) times daily with a meal.    . methimazole (TAPAZOLE) 5 MG tablet Take 7.5 mg by mouth every evening.   2  . Multiple Vitamin (MULTIVITAMIN WITH MINERALS) TABS tablet Take 1 tablet by mouth every evening.     . Omega-3 Fatty Acids (FISH OIL) 1000 MG CAPS Take 2,000 mg by mouth every evening.     . ondansetron (ZOFRAN ODT) 4 MG disintegrating tablet Take 1 tablet (4 mg total) by mouth every 8 (eight) hours as needed for nausea or vomiting. 20 tablet 0  . pantoprazole (PROTONIX) 40 MG tablet Take 1 tablet (40 mg total) by mouth  daily. 90 tablet 3  . potassium chloride SA (K-DUR,KLOR-CON) 20 MEQ tablet Take 20 mEq by mouth daily as needed (for leg cramping). Take one tablet by mouth daily.    . rivaroxaban (XARELTO) 20 MG TABS tablet Take 1 tablet (20 mg total) by mouth daily with supper. 30 tablet 3  . Vitamin D, Ergocalciferol, (DRISDOL) 50000 UNITS CAPS capsule Take 1 capsule (50,000 Units total) by mouth every 7 (seven) days. 4 capsule 3  . atenolol (TENORMIN) 25 MG tablet Take 1 tablet (25 mg total) by mouth daily as needed. 15 tablet 2   No current facility-administered medications for this visit.    Allergies:   Review of patient's allergies indicates no known allergies.    Social History:  The patient  reports that she has never smoked. She has never used smokeless tobacco. She reports that she does not drink alcohol or use illicit drugs.   Family History:  The patient's family history includes Coronary artery disease in her father; Diabetes in an other family  member; Hyperlipidemia in her other; Hypertension in her father.    ROS:  Please see the history of present illness.   Otherwise, review of systems are positive for none.   All other systems are reviewed and negative.    PHYSICAL EXAM: VS:  BP 136/74 mmHg  Pulse 72  Ht 5\' 1"  (1.549 m)  Wt 196 lb 6.4 oz (89.086 kg)  BMI 37.13 kg/m2  SpO2 99% , BMI Body mass index is 37.13 kg/(m^2). GEN: Well nourished, well developed, in no acute distress HEENT: normal Neck: no JVD, carotid bruits, or masses Cardiac: RRR; no murmurs, rubs, or gallops,no edema  Respiratory:  clear to auscultation bilaterally, normal work of breathing GI: soft, nontender, nondistended, + BS MS: no deformity or atrophy Skin: warm and dry, no rash Neuro:  Strength and sensation are intact Psych: euthymic mood, full affect   EKG:  EKG is not ordered today.    Recent Labs: 03/27/2014: Magnesium 2.1 07/02/2014: TSH 0.23* 09/06/2014: ALT 40; BUN 19; Creatinine 0.62; Hemoglobin 11.5*; Platelets 265; Potassium 4.0; Sodium 138    Lipid Panel    Component Value Date/Time   CHOL 155 07/02/2014 1618   TRIG 100.0 07/02/2014 1618   HDL 39.60 07/02/2014 1618   CHOLHDL 4 07/02/2014 1618   VLDL 20.0 07/02/2014 1618   LDLCALC 95 07/02/2014 1618   LDLDIRECT 183.1 02/24/2013 0842      Wt Readings from Last 3 Encounters:  09/13/14 196 lb 6.4 oz (89.086 kg)  09/06/14 180 lb (81.647 kg)  07/04/14 185 lb (83.915 kg)     ASSESSMENT AND PLAN: 1.  History of chest pain with negative myoview 2. Paroxysmal atrial fibrillation maintaining NSR with one breakthrough. Continue Tenormin/Xarelto.  I will give her a prescription for Tenormin 25mg  to take 1/2 to 1 tablet PRN for breakthrough PAF. I have encouraged her to use her CPAP nightly.   3. HTN well controlled. Continue BB/ACE I 4. Hyperthyroidism- followed by PCP now back on Methimazole  Current medicines are reviewed at length with the patient today.  The patient does  not have concerns regarding medicines.  The following changes have been made:  no change  Labs/ tests ordered today include: see above assessment and plan No orders of the defined types were placed in this encounter.     Disposition:   FU with me in 1 year   Signed, Sueanne Margarita, MD  09/13/2014 9:34 AM    [facility] [place], [place], [place]  [address] Phone: [phone]; Fax: [phone]

## 2014-09-13 NOTE — Patient Instructions (Signed)
Medication Instructions:  Your physician has recommended you make the following change in your medication:  1) START TENORMIN 25 mg 1/2 tablet (12.5 mg total) AS NEEDED for breakthrough atrial fibrillation  Labwork: None  Testing/Procedures: None  Follow-Up: Your physician wants you to follow-up in: 6 months with Dr. Radford Pax. You will receive a reminder letter in the mail two months in advance. If you don't receive a letter, please call our office to schedule the follow-up appointment.   Any Other Special Instructions Will Be Listed Below (If Applicable).

## 2014-10-05 LAB — HM COLONOSCOPY: HM Colonoscopy: NORMAL

## 2014-10-08 ENCOUNTER — Encounter: Payer: Self-pay | Admitting: General Practice

## 2014-10-12 ENCOUNTER — Ambulatory Visit (INDEPENDENT_AMBULATORY_CARE_PROVIDER_SITE_OTHER): Payer: 59 | Admitting: Internal Medicine

## 2014-10-12 ENCOUNTER — Ambulatory Visit: Payer: 59 | Admitting: Family Medicine

## 2014-10-12 ENCOUNTER — Encounter: Payer: Self-pay | Admitting: Internal Medicine

## 2014-10-12 VITALS — BP 134/90 | HR 86 | Temp 98.9°F | Wt 194.2 lb

## 2014-10-12 DIAGNOSIS — J4520 Mild intermittent asthma, uncomplicated: Secondary | ICD-10-CM

## 2014-10-12 DIAGNOSIS — I480 Paroxysmal atrial fibrillation: Secondary | ICD-10-CM

## 2014-10-12 DIAGNOSIS — R002 Palpitations: Secondary | ICD-10-CM

## 2014-10-12 MED ORDER — BECLOMETHASONE DIPROPIONATE 80 MCG/ACT IN AERS: 3.0000 | INHALATION_SPRAY | Freq: Every day | RESPIRATORY_TRACT | Status: AC

## 2014-10-12 NOTE — Patient Instructions (Signed)
Start using Qvar 3 puffs every day Use albuterol only if cough or wheezing. Mucinex DM twice a day until better If your cough and mucus production are not back to normal within few days please let us know, also call us if you get worse.  For your heart, keep taking the same medication and carry the extra atenolol to be taken as needed If you have severe symptoms call the clinic  Please see your primary doctor in 2 or 3 weeks

## 2014-10-12 NOTE — Progress Notes (Signed)
Subjective:    Patient ID: Crystal Peck, female    DOB: August 09, 1965, 49 y.o.   MRN: [id number]  DOS:  10/12/2014 Type of visit - description : Patient working, several symptoms Interval history: Not feeling well for the last few days: On and off palpitations, increased cough from baseline, some yellow sputum production and increasing on and off. Palpitations last 20 or 30 minutes, worse with exertion, has taken sometimes extra atenolol, symptoms last approximately 20 minutes.   Review of Systems  Denies fever chills, no runny nose or sore throat. Some sneezing and itchy eyes. Denies sub sternal chest pain, admits to  some tightness. No syncope.  Past Medical History  Diagnosis Date  . Asthma   . Diabetes mellitus   . Hyperlipidemia   . Hypertension   . GERD (gastroesophageal reflux disease)   . Cerebellar tumor   . Obesity   . Sleep apnea   . A-fib   . Thyroid disease   . Chest pain 03/27/2014    negative lexiscan myoview    Past Surgical History  Procedure Laterality Date  . Cholecystectomy    . Partial hysterectomy      ovaries remain  . Nasal sinus surgery      Dr. Lyn Hollingshead  . Abdominal hysterectomy      History   Social History  . Marital Status: Legally Separated    Spouse Name: N/A  . Number of Children: N/A  . Years of Education: N/A   Occupational History  . Not on file.   Social History Main Topics  . Smoking status: Never Smoker   . Smokeless tobacco: Never Used  . Alcohol Use: No  . Drug Use: No  . Sexual Activity: Not on file   Other Topics Concern  . Not on file   Social History Narrative        Medication List       This list is accurate as of: 10/12/14  1:46 PM.  Always use your most recent med list.               albuterol 108 (90 BASE) MCG/ACT inhaler  Commonly known as:  PROVENTIL HFA;VENTOLIN HFA  Inhale 2 puffs into the lungs every 4 (four) hours as needed for wheezing or shortness of breath.     atenolol 100 MG  tablet  Commonly known as:  TENORMIN  Take 1 tablet (100 mg total) by mouth every evening.     atenolol 25 MG tablet  Commonly known as:  TENORMIN  Take 1 tablet (25 mg total) by mouth daily as needed.     atorvastatin 40 MG tablet  Commonly known as:  LIPITOR  TAKE 1 TABLET BY MOUTH DAILY     azelastine 0.05 % ophthalmic solution  Commonly known as:  OPTIVAR  Place 1 drop into both eyes 2 (two) times daily.     cetirizine 10 MG tablet  Commonly known as:  ZYRTEC  Take 1 tablet (10 mg total) by mouth daily.     Fish Oil 1000 MG Caps  Take 2,000 mg by mouth every evening.     glipiZIDE 10 MG 24 hr tablet  Commonly known as:  GLUCOTROL XL  Take 10 mg by mouth daily with breakfast.     glucose blood test strip  1 each by Other route as needed. Onetouch ultra test strip     INVOKANA 100 MG Tabs tablet  Generic drug:  canagliflozin  Take 1 tablet by  mouth daily.     IRON PO  Take 2 tablets by mouth 3 (three) times a week.     LANTUS SOLOSTAR 100 UNIT/ML Solostar Pen  Generic drug:  Insulin Glargine  Inject 32 Units into the skin every morning. AS DIRECTED     lisinopril 5 MG tablet  Commonly known as:  PRINIVIL,ZESTRIL  Take 1 tablet (5 mg total) by mouth daily.     metFORMIN 1000 MG tablet  Commonly known as:  GLUCOPHAGE  Take 1,000 mg by mouth 2 (two) times daily with a meal.     methimazole 5 MG tablet  Commonly known as:  TAPAZOLE  Take 7.5 mg by mouth every evening.     multivitamin with minerals Tabs tablet  Take 1 tablet by mouth every evening.     ondansetron 4 MG disintegrating tablet  Commonly known as:  ZOFRAN ODT  Take 1 tablet (4 mg total) by mouth every 8 (eight) hours as needed for nausea or vomiting.     pantoprazole 40 MG tablet  Commonly known as:  PROTONIX  Take 1 tablet (40 mg total) by mouth daily.     potassium chloride SA 20 MEQ tablet  Commonly known as:  K-DUR,KLOR-CON  Take 20 mEq by mouth daily as needed (for leg cramping). Take  one tablet by mouth daily.     rivaroxaban 20 MG Tabs tablet  Commonly known as:  XARELTO  Take 1 tablet (20 mg total) by mouth daily with supper.     Vitamin D (Ergocalciferol) 50000 UNITS Caps capsule  Commonly known as:  DRISDOL  Take 1 capsule (50,000 Units total) by mouth every 7 (seven) days.           Objective:   Physical Exam BP 134/90 mmHg  Pulse 86  Temp(Src) 98.9 F (37.2 C) (Oral)  Wt 194 lb 3.2 oz (88.089 kg)  SpO2 90%  General:   Well developed, well nourished . NAD.  HEENT:  Normocephalic . Face symmetric, atraumatic Tympanic membranes normal, throat without redness,a slightly congested. Sinuses not TTP. Lungs:  No wheezing today, few rhonchi with cough only Normal respiratory effort, no intercostal retractions, no accessory muscle use. Heart: RRR,  no murmur.  No pretibial edema bilaterally  Skin: Not pale. Not jaundice Neurologic:  alert & oriented X3.  Speech normal, gait appropriate for age and unassisted Psych--  Cognition and judgment appear intact.  Cooperative with normal attention span and concentration.  Behavior appropriate. No anxious or depressed appearing.       Assessment & Plan:    Patient with a history of asthma, atrial fibrillation, anticoagulated presents with a mild increasing cough, palpitation and sputum production. EKG shows sinus rhythm, no change from previous. On clinical grounds, no evidence of pneumonia or sinusitis. URI- Allergies exacerbating her respiratory symptoms? Plan: Conservative treatment with Mucinex, add Qvar. Call if symptoms increase Also, she has hyperthyroidism, reports that she is going to get her thyroid checked in endocrinology today. See  instructions.

## 2014-10-12 NOTE — Progress Notes (Signed)
Pre visit review using our clinic review tool, if applicable. No additional management support is needed unless otherwise documented below in the visit note. 

## 2014-10-15 ENCOUNTER — Encounter: Payer: Self-pay | Admitting: Family Medicine

## 2014-10-15 ENCOUNTER — Ambulatory Visit (INDEPENDENT_AMBULATORY_CARE_PROVIDER_SITE_OTHER): Payer: 59 | Admitting: Family Medicine

## 2014-10-15 ENCOUNTER — Telehealth: Payer: Self-pay | Admitting: Family Medicine

## 2014-10-15 VITALS — BP 162/82 | HR 82 | Temp 98.1°F | Resp 16 | Ht 61.0 in | Wt 188.5 lb

## 2014-10-15 DIAGNOSIS — F411 Generalized anxiety disorder: Secondary | ICD-10-CM

## 2014-10-15 DIAGNOSIS — H1013 Acute atopic conjunctivitis, bilateral: Secondary | ICD-10-CM | POA: Diagnosis not present

## 2014-10-15 DIAGNOSIS — J309 Allergic rhinitis, unspecified: Principal | ICD-10-CM

## 2014-10-15 DIAGNOSIS — I10 Essential (primary) hypertension: Secondary | ICD-10-CM

## 2014-10-15 MED ORDER — ATORVASTATIN CALCIUM 40 MG PO TABS: 40.0000 mg | ORAL_TABLET | Freq: Every day | ORAL | Status: DC

## 2014-10-15 MED ORDER — ATENOLOL 100 MG PO TABS: 100.0000 mg | ORAL_TABLET | Freq: Every evening | ORAL | Status: AC

## 2014-10-15 MED ORDER — LISINOPRIL-HYDROCHLOROTHIAZIDE 10-12.5 MG PO TABS: 1.0000 | ORAL_TABLET | Freq: Every day | ORAL | Status: DC

## 2014-10-15 MED ORDER — LISINOPRIL 5 MG PO TABS: 5.0000 mg | ORAL_TABLET | Freq: Every day | ORAL | Status: DC

## 2014-10-15 MED ORDER — ESCITALOPRAM OXALATE 10 MG PO TABS: 10.0000 mg | ORAL_TABLET | Freq: Every day | ORAL | Status: DC

## 2014-10-15 MED ORDER — AZELASTINE HCL 0.05 % OP SOLN: 1.0000 [drp] | Freq: Two times a day (BID) | OPHTHALMIC | Status: AC

## 2014-10-15 MED ORDER — CETIRIZINE HCL 10 MG PO TABS: 10.0000 mg | ORAL_TABLET | Freq: Every day | ORAL | Status: DC

## 2014-10-15 NOTE — Telephone Encounter (Signed)
Ok to use?

## 2014-10-15 NOTE — Progress Notes (Signed)
Pre visit review using our clinic review tool, if applicable. No additional management support is needed unless otherwise documented below in the visit note. 

## 2014-10-15 NOTE — Patient Instructions (Signed)
Follow up in 2 weeks to recheck BP STOP the Lisinopril 5mg  START the Lisinopril HCTZ 10/12.5mg  daily CONTINUE the Atenolol START the Lexapro daily for the anxiety- this will take 2-4 weeks to work Make sure you are following up with Endocrinology for the diabetes and the thyroid and Cardiology for the Afib Have your work send me FMLA forms to complete If you are interested in disability, you should go to the social security office and ask them for assistance.  You may need a disability attourney to help you.  I do not do disability evaluations Call with any questions or concerns Hang in there!!

## 2014-10-15 NOTE — Telephone Encounter (Signed)
Pt AVS 10/15/14 states f/u in 2 weeks to recheck bp with Dr. Birdie Riddle. Only available slots are Same Day. Pt req 11:30am. Ok to use Same day?

## 2014-10-15 NOTE — Progress Notes (Signed)
   Subjective:    Patient ID: Crystal Peck, female    DOB: 03/22/1966, 49 y.o.   MRN: [id number]  HPI  HTN- chronic problem, on atenolol 100mg  and lisinopril 5 mg daily.  Currently denies CP, SOB, HAs, visual changes, edema.  'i'm just depressed'- 'every time i'm at work i get sick'.  'i can't breathe'.  Pt reports she'll go to the ER and 'they can't find anything'.  Pt reports high stress at work.  Pt works 9 hrs/week.  No SOB, anxiety, CP at home.  Denies hx of anxiety/depression.  Has not thought about counseling.  Wants to be taken out of work   Review of Systems For ROS see HPI     Objective:   Physical Exam  Constitutional: She is oriented to person, place, and time. She appears well-developed and well-nourished. No distress.  HENT:  Head: Normocephalic and atraumatic.  Eyes: Conjunctivae and EOM are normal. Pupils are equal, round, and reactive to light.  Neck: Normal range of motion. Neck supple. No thyromegaly present.  Cardiovascular: Normal rate, regular rhythm, normal heart sounds and intact distal pulses.   No murmur heard. Pulmonary/Chest: Effort normal and breath sounds normal. No respiratory distress.  Abdominal: Soft. She exhibits no distension. There is no tenderness.  Musculoskeletal: She exhibits edema (trace edema of L LE, no edema on R).  Lymphadenopathy:    She has no cervical adenopathy.  Neurological: She is alert and oriented to person, place, and time.  Skin: Skin is warm and dry.  Psychiatric: She has a normal mood and affect. Her behavior is normal.  Vitals reviewed.         Assessment & Plan:

## 2014-10-16 ENCOUNTER — Telehealth: Payer: Self-pay | Admitting: *Deleted

## 2014-10-16 NOTE — Telephone Encounter (Signed)
Caller name: Crystal Peck Relationship to patient: self Can be reached: [phone] Pharmacy:  Reason for call: Pt called to f/u on this. I informed her that we will get completed asap and fax back to [employer]. Said her boss is harrassing her to see if they are completed.

## 2014-10-16 NOTE — Telephone Encounter (Signed)
FMLA forms received via fax from [employer]. Forms filled out as much as possible and forwarded to Dr. Birdie Riddle. JG/CMA

## 2014-10-16 NOTE — Telephone Encounter (Signed)
These were received today, the provider just got them. Our turn around for forms is 5-7 business days (not including weekends or holidays). Forms will be completed in that timeframe.

## 2014-10-19 ENCOUNTER — Ambulatory Visit (INDEPENDENT_AMBULATORY_CARE_PROVIDER_SITE_OTHER): Payer: Self-pay | Admitting: Family Medicine

## 2014-10-19 VITALS — BP 179/90 | HR 93 | Ht 61.0 in | Wt 189.4 lb

## 2014-10-19 DIAGNOSIS — E119 Type 2 diabetes mellitus without complications: Secondary | ICD-10-CM

## 2014-10-19 NOTE — Progress Notes (Signed)
Subjective:  Patient presents today for 3 month diabetes follow-up as part of the employer-sponsored Link to Wellness program.  Current diabetes regimen includes Lantus, metformin and is supposed to include glipizide ER 10 but there are potentially compliance issues. Patient continues on daily ASA, lisinopril and atorvastatin. Most recent MD follow up was 10/15/14 and next follow up is 10/29/14 (both with Dr. Birdie Riddle). Recent change in medication from lisinopril 5mg  to lisinopril hctz 10/12.5 mg, also new addition of escitalopram 10 mg daily (both implemented only 4 days ago).  Patient reports increased (new?) feelings of depression and anxiety. Dr. Birdie Riddle has very recently started her on escitalopram 10 mg daily. Diabetes control is currently not her primary problem; strong emphasis today on her mental health.  Assessment/Plan:  Patient is a 49 y.o. female with DM 2. Most recent A1C was  7.5% which is at exceeding goal of less than 7%. Weight is increased by 4 lbs from last visit (3 months ago) with me.   Lifestyle improvements:  Physical Activity-  Patient reports being unable and unwilling to engage in physical activity. Likely secondary to recent introduction of anxiety/depression. I encouraged her as she starts to feel better mentally to begin to improve physically as well.  Nutrition- Diet continues to be a struggle and often is dictated by what is easiest and in front of her. 24h recall was labored and likely inaccurate/incomplete. She is aware that carbohydrates are what raises her blood glucose but we continue to educate on what foods are high carb vs. Low carb.  Today we spent a significant portion of time discussing what she can expect with the counselor/therapist/psychiatrist/psychologist. We also discussed expectations around medication (escitalopram) and time courses/side effects/effectiveness. Her diabetes control will be extremely difficult with her current mental health status. I  emphasized the importance and value of medications and therapy. She was very receptive and appreciative.  We also discussed the importance of her taking some responsibility for her medication list. We have previously discussed her creating a medication list and the value added of her having a good working knowledge of her medications.  Follow up with me in 3 months.    Goals for Next Visit:  1. Follow up with Counselor/therapist/psychiatrist (consider changing to [facility] if available) 2. Verify you have Glipizide ER 10mg  at home. Start taking if you haven't been; if you have been, continue taking. This will keep your blood glucoses down a bit better. 3. Stop LISINOPRIL.   START LISINOPRIL/HCTZ 10/12.5  4. For the next 10 days, increase your Lisinopril/hctz to 2 tablets a day (1 in AM and 1 at lunch/dinner). Talk with Dr. Birdie Riddle about whether she wants to continue you on 1 vs. 2 tablets a day. This is based on today's blood pressure of 179/90 with a 93 pulse. 5. Create list of medications, make sure you have all the medications and you are taking them appropriately. Call me back when you have created your list.  6. When you are ready for a refill, bring the pill bottles to the pharmacy that you want refilled.   Next appointment to see me is: October 4th @ 10AM

## 2014-10-19 NOTE — Patient Instructions (Signed)
Goals:  1. Follow up with Counselor/therapist/psychiatrist (consider changing to [facility] if available) 2. Verify you have Glipizide ER 10mg  at home. Start taking if you haven't been; if you have been, continue taking. This will keep your blood glucoses down a bit better. 3. Stop LISINOPRIL.   START LISINOPRIL/HCTZ 10/12.5  4. For the next 10 days, increase your Lisinopril/hctz to 2 tablets a day (1 in AM and 1 at lunch/dinner). Talk with Dr. Birdie Riddle about whether she wants to continue you on 1 vs. 2 tablets a day. This is based on today's blood pressure of 179/90 with a 93 pulse. 5. Create list of medications, make sure you have all the medications and you are taking them appropriately. Call me back when you have created your list.  6. When you are ready for a refill, bring the pill bottles to the pharmacy that you want refilled.

## 2014-10-22 NOTE — Assessment & Plan Note (Signed)
Deteriorated.  Pt's BP is not well controlled.  Based on this, will increase Lisinopril to 10mg  and add HCTZ 12.5mg .  Continue Atenolol.  Will follow closely to ensure BP normalizes.

## 2014-10-22 NOTE — Assessment & Plan Note (Signed)
Deteriorated.  Pt feels this is all work related stress.  Wants to be taken out of work.  Explained that she could use her FMLA time but that I am not a disability doctor and that would have to go through the social security office and possibly another physician.  Pt is not currently on a controller med for her anxiety- will start Lexapro.  Reviewed supportive care and red flags that should prompt return.  Pt expressed understanding and is in agreement w/ plan.

## 2014-10-23 DIAGNOSIS — Z7689 Persons encountering health services in other specified circumstances: Secondary | ICD-10-CM

## 2014-10-24 NOTE — Telephone Encounter (Signed)
Received notification that FMLA paperwork was incomplete.  Form that needed to be corrected was faxed along with notification.  Form was completed by Dr. Birdie Riddle and was then faxed back.  Fax confirmation received.  Paperwork was numbered and placed in scan basket for scanning.

## 2014-10-26 DIAGNOSIS — E0500 Thyrotoxicosis with diffuse goiter without thyrotoxic crisis or storm: Secondary | ICD-10-CM | POA: Insufficient documentation

## 2014-10-26 LAB — HEMOGLOBIN A1C: Hgb A1c MFr Bld: 8.9 % — AB (ref 4.0–6.0)

## 2014-10-29 ENCOUNTER — Encounter: Payer: Self-pay | Admitting: General Practice

## 2014-10-29 ENCOUNTER — Encounter: Payer: Self-pay | Admitting: Family Medicine

## 2014-10-29 ENCOUNTER — Ambulatory Visit (INDEPENDENT_AMBULATORY_CARE_PROVIDER_SITE_OTHER): Payer: 59 | Admitting: Family Medicine

## 2014-10-29 ENCOUNTER — Telehealth: Payer: Self-pay | Admitting: Family Medicine

## 2014-10-29 VITALS — BP 162/92 | HR 105 | Temp 98.0°F | Resp 16 | Ht 61.0 in | Wt 186.1 lb

## 2014-10-29 DIAGNOSIS — I10 Essential (primary) hypertension: Secondary | ICD-10-CM

## 2014-10-29 DIAGNOSIS — F411 Generalized anxiety disorder: Secondary | ICD-10-CM | POA: Diagnosis not present

## 2014-10-29 LAB — BASIC METABOLIC PANEL
BUN: 13 mg/dL (ref 6–23)
CALCIUM: 9.5 mg/dL (ref 8.4–10.5)
CO2: 27 mEq/L (ref 19–32)
Chloride: 101 mEq/L (ref 96–112)
Creatinine, Ser: 0.67 mg/dL (ref 0.40–1.20)
GFR: 119.78 mL/min (ref >60.00)
Glucose, Bld: 286 mg/dL — ABNORMAL HIGH (ref 70–99)
Potassium: 3.5 mEq/L (ref 3.5–5.1)
SODIUM: 137 meq/L (ref 135–145)

## 2014-10-29 MED ORDER — AMLODIPINE BESYLATE 5 MG PO TABS: 5.0000 mg | ORAL_TABLET | Freq: Every day | ORAL | Status: DC

## 2014-10-29 MED ORDER — ALPRAZOLAM 0.5 MG PO TABS: 0.5000 mg | ORAL_TABLET | Freq: Two times a day (BID) | ORAL | Status: DC | PRN

## 2014-10-29 MED ORDER — LISINOPRIL-HYDROCHLOROTHIAZIDE 10-12.5 MG PO TABS: 1.0000 | ORAL_TABLET | Freq: Two times a day (BID) | ORAL | Status: DC

## 2014-10-29 NOTE — Telephone Encounter (Signed)
Caller name: Yumi Insalaco.  Relation to pt: Self Call back number: [phone] Pharmacy:  Reason for call: Pt state is needing letter for job ([employer]) notifying that she will be out of work still and that she will return on what date. Please advise.

## 2014-10-29 NOTE — Progress Notes (Signed)
Pre visit review using our clinic review tool, if applicable. No additional management support is needed unless otherwise documented below in the visit note. 

## 2014-10-29 NOTE — Patient Instructions (Signed)
Follow up in 2 weeks to recheck BP We'll notify you of your lab results and make any changes if needed Continue the Lisinopril-HCTZ twice daily Continue the Atenolol once daily START the Amlodipine daily for blood pressure Continue the Lexapro once daily Take the Alprazolam (1/2 tab) as needed for high anxiety Call Dr Radford Pax to get an appt for your palpitations Call with any questions or concerns Hang in there!!

## 2014-10-29 NOTE — Telephone Encounter (Signed)
[place] for letter to return on 8/1

## 2014-10-29 NOTE — Assessment & Plan Note (Addendum)
Ongoing for pt.  She reports feeling better since starting the Lexapro but still having difficulty w/ high anxiety.  Pt doesn't feel she is ready to go to work.  Starts seeing her counselor next week.  Asking for more time, wants to return on 8/1.  Will extend FMLA.  Start Alprazolam prn.  Will follow closely

## 2014-10-29 NOTE — Progress Notes (Signed)
   Subjective:    Patient ID: Crystal Peck, female    DOB: Jul 03, 1965, 49 y.o.   MRN: [id number]  HPI HTN- chronic problem, pt reports she is taking Lisinopril-HCZT twice daily.  Taking Atenolol daily.  Pt was confused by instructions from PharmD and stopped the Lipitor rather than the Lisinopril (which she also stopped).  No CP, SOB, HAs, visual changes, edema.  Anxiety- pt still having symptoms despite starting Lexapro.  She states she feels better since starting the Lexapro but will still have panicked moments.  She reports this is causing her BP to rise.  She doesn't feel she is ready to return to work as this is the cause of her stress.  Review of Systems For ROS see HPI     Objective:   Physical Exam  Constitutional: She is oriented to person, place, and time. She appears well-developed and well-nourished. No distress.  HENT:  Head: Normocephalic and atraumatic.  Eyes: Conjunctivae and EOM are normal. Pupils are equal, round, and reactive to light.  Neck: Normal range of motion. Neck supple. No thyromegaly present.  Cardiovascular: Normal rate, regular rhythm, normal heart sounds and intact distal pulses.   No murmur heard. Pulmonary/Chest: Effort normal and breath sounds normal. No respiratory distress.  Abdominal: Soft. She exhibits no distension. There is no tenderness.  Musculoskeletal: She exhibits no edema.  Lymphadenopathy:    She has no cervical adenopathy.  Neurological: She is alert and oriented to person, place, and time.  Skin: Skin is warm and dry.  Psychiatric: She has a normal mood and affect. Her behavior is normal.  Vitals reviewed.         Assessment & Plan:

## 2014-10-29 NOTE — Telephone Encounter (Signed)
Letter written and pt advised available at front desk for pick up.

## 2014-10-29 NOTE — Assessment & Plan Note (Signed)
Still not controlled despite increasing Lisinopril to twice daily.  Continues on Atenolol.  Since BP is still not at goal will add Amlodipine.  Reviewed need for low salt diet, regular exercise.  Check BMP due to increased Lisinopril dose.  Will adjust dose as needed.  Will follow closely.

## 2014-10-29 NOTE — Telephone Encounter (Signed)
[place] for letter until, FMLA is completed?

## 2014-10-30 ENCOUNTER — Encounter: Payer: Self-pay | Admitting: General Practice

## 2014-11-02 ENCOUNTER — Telehealth: Payer: Self-pay | Admitting: *Deleted

## 2014-11-02 NOTE — Telephone Encounter (Signed)
Received STD benefit forms via fax from [place]. Filled out as much as possible and forwarded to Dr. Birdie Riddle. JG//CMA

## 2014-11-06 ENCOUNTER — Ambulatory Visit (INDEPENDENT_AMBULATORY_CARE_PROVIDER_SITE_OTHER): Payer: 59 | Admitting: Physician Assistant

## 2014-11-06 VITALS — BP 126/80 | HR 55 | Temp 98.4°F | Resp 16 | Ht 63.0 in | Wt 188.2 lb

## 2014-11-06 DIAGNOSIS — H11003 Unspecified pterygium of eye, bilateral: Secondary | ICD-10-CM

## 2014-11-06 DIAGNOSIS — H3561 Retinal hemorrhage, right eye: Secondary | ICD-10-CM | POA: Diagnosis not present

## 2014-11-06 DIAGNOSIS — H1131 Conjunctival hemorrhage, right eye: Secondary | ICD-10-CM

## 2014-11-06 NOTE — Patient Instructions (Signed)
Pterygium Pterygia are fleshy growths that arise from the conjunctiva. This is the red velvety membrane you see when you pull your lower eyelid down. When they grow out over the cornea (clear membrane on the front of your eye), they block vision. It becomes difficult to see. It may also cause irritation, making the eye red and sore. It also causes cosmetic problems. This means your eye does not look as good as when it was healthy. One of the most common problems with pterygia are that they often come back even after complete removal. TREATMENT  Pterygia are removed with a procedure. This is often done with a local anesthetic. This is a medicine that makes the eye and area being worked on numb. They are often removed using a microscope. This is an instrument the surgeon looks through that magnifies the small area of the procedure. When these operations are done with the patient awake, the patient must be able to hold still and cooperate with the surgeon's instructions.  SEEK IMMEDIATE MEDICAL CARE IF:   You have redness, swelling, or increasing pain near or around the eye.  You notice a change in your vision.  Document Released: 12/30/2000 Document Revised: 08/21/2013 Document Reviewed: 03/03/2007 [facility] Patient Information 2015 [place], [place]. This information is not intended to replace advice given to you by your health care provider. Make sure you discuss any questions you have with your health care provider.

## 2014-11-06 NOTE — Progress Notes (Signed)
   Subjective:    Patient ID: Crystal Peck, female    DOB: Aug 29, 1965, 49 y.o.   MRN: [id number]  HPI Patient presents for eye redness that has been present for past 3 days. States that eyes are watery during the day, but dry when she first wakes up. States that eyes are not painful or itchy. Denies change in vision or photosensitivity. Denies congestion, rhinorrhea, cough, sore throat, fever, or HA. Denies trauma to eye. Denies foreign body sensation. Not sure how long she has had growths on eyes, but has knows it is over 2 weeks. 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 Does not recall if she got dust in her eyes. NKDA.   Review of Systems  Constitutional: Negative.  Negative for fever.  HENT: Negative for congestion, ear pain, rhinorrhea, sinus pressure, sneezing and sore throat.   Eyes: Positive for discharge and redness. Negative for photophobia, pain, itching and visual disturbance.  Respiratory: Negative for cough.   Gastrointestinal: Negative for nausea and vomiting.  Neurological: Negative for headaches.       Objective:   Physical Exam  Constitutional: She is oriented to person, place, and time. She appears well-developed and well-nourished. No distress.  Blood pressure 126/80, pulse 55, temperature 98.4 F (36.9 C), temperature source Oral, resp. rate 16, height 5\' 3"  (1.6 m), weight 188 lb 3.2 oz (85.367 kg), SpO2 98 %.   HENT:  Head: Normocephalic and atraumatic.  Right Ear: Tympanic membrane, external ear and ear canal normal.  Left Ear: Tympanic membrane, external ear and ear canal normal.  Nose: Nose normal.  Mouth/Throat: Oropharynx is clear and moist. No oropharyngeal exudate.  Eyes: EOM are normal. Pupils are equal, round, and reactive to light. Right eye exhibits discharge (watery). Right eye exhibits no chemosis, no exudate and no hordeolum. Foreign body (pterygium) present in the right eye. Left eye exhibits no chemosis, no discharge, no exudate and no  hordeolum. Foreign body (pterygium) present in the left eye. Right conjunctiva is not injected. Right conjunctiva has a hemorrhage (vessel has ruptured over pterygium). Left conjunctiva is not injected. Left conjunctiva has no hemorrhage. No scleral icterus.  Neck: Normal range of motion. Neck supple.  Pulmonary/Chest: Effort normal.  Lymphadenopathy:    She has no cervical adenopathy.  Neurological: She is alert and oriented to person, place, and time.  Skin: Skin is warm and dry. No rash noted. She is not diaphoretic. No erythema. No pallor.  Psychiatric: She has a normal mood and affect. Her behavior is normal. Judgment and thought content normal.      Assessment & Plan:  1. Pterygium eye, bilateral 2. Burst blood vessel in eye, right [place]st[place] eye drops to keep eyes moist. If vision becomes affected or foreign body sensation develops will send to ophthalmologist.   Alveta Heimlich PA-C  [facility] 11/06/2014 11:15 AM

## 2014-11-07 NOTE — Telephone Encounter (Signed)
Completed/signed by provider and faxed. Original sent for scanning.

## 2014-11-13 LAB — HEMOGLOBIN A1C: Hgb A1c MFr Bld: 9.3 % — AB (ref 4.0–6.0)

## 2014-11-14 ENCOUNTER — Encounter: Payer: Self-pay | Admitting: Cardiology

## 2014-11-14 ENCOUNTER — Ambulatory Visit (INDEPENDENT_AMBULATORY_CARE_PROVIDER_SITE_OTHER): Payer: 59

## 2014-11-14 ENCOUNTER — Encounter (HOSPITAL_COMMUNITY): Payer: Self-pay | Admitting: *Deleted

## 2014-11-14 ENCOUNTER — Other Ambulatory Visit: Payer: Self-pay | Admitting: Cardiology

## 2014-11-14 ENCOUNTER — Ambulatory Visit (INDEPENDENT_AMBULATORY_CARE_PROVIDER_SITE_OTHER): Payer: 59 | Admitting: Cardiology

## 2014-11-14 ENCOUNTER — Ambulatory Visit (HOSPITAL_BASED_OUTPATIENT_CLINIC_OR_DEPARTMENT_OTHER): Payer: 59

## 2014-11-14 ENCOUNTER — Ambulatory Visit (HOSPITAL_COMMUNITY): Payer: 59 | Attending: Cardiology

## 2014-11-14 ENCOUNTER — Other Ambulatory Visit: Payer: Self-pay

## 2014-11-14 ENCOUNTER — Emergency Department (HOSPITAL_COMMUNITY)
Admission: EM | Admit: 2014-11-14 | Discharge: 2014-11-15 | Disposition: A | Discharge status: Home / Self Care | Payer: 59 | Attending: Emergency Medicine | Admitting: Emergency Medicine

## 2014-11-14 VITALS — BP 162/82 | HR 97 | Ht 63.0 in | Wt 183.6 lb

## 2014-11-14 DIAGNOSIS — R079 Chest pain, unspecified: Secondary | ICD-10-CM | POA: Diagnosis not present

## 2014-11-14 DIAGNOSIS — E669 Obesity, unspecified: Secondary | ICD-10-CM | POA: Diagnosis not present

## 2014-11-14 DIAGNOSIS — R1011 Right upper quadrant pain: Secondary | ICD-10-CM | POA: Insufficient documentation

## 2014-11-14 DIAGNOSIS — I480 Paroxysmal atrial fibrillation: Secondary | ICD-10-CM

## 2014-11-14 DIAGNOSIS — Z86018 Personal history of other benign neoplasm: Secondary | ICD-10-CM | POA: Insufficient documentation

## 2014-11-14 DIAGNOSIS — R0602 Shortness of breath: Secondary | ICD-10-CM | POA: Insufficient documentation

## 2014-11-14 DIAGNOSIS — E0590 Thyrotoxicosis, unspecified without thyrotoxic crisis or storm: Secondary | ICD-10-CM

## 2014-11-14 DIAGNOSIS — E119 Type 2 diabetes mellitus without complications: Secondary | ICD-10-CM | POA: Insufficient documentation

## 2014-11-14 DIAGNOSIS — K219 Gastro-esophageal reflux disease without esophagitis: Secondary | ICD-10-CM | POA: Diagnosis not present

## 2014-11-14 DIAGNOSIS — Z79899 Other long term (current) drug therapy: Secondary | ICD-10-CM | POA: Diagnosis not present

## 2014-11-14 DIAGNOSIS — R600 Localized edema: Secondary | ICD-10-CM

## 2014-11-14 DIAGNOSIS — R609 Edema, unspecified: Secondary | ICD-10-CM | POA: Insufficient documentation

## 2014-11-14 DIAGNOSIS — R002 Palpitations: Secondary | ICD-10-CM

## 2014-11-14 DIAGNOSIS — R17 Unspecified jaundice: Secondary | ICD-10-CM | POA: Diagnosis not present

## 2014-11-14 DIAGNOSIS — Z8669 Personal history of other diseases of the nervous system and sense organs: Secondary | ICD-10-CM | POA: Diagnosis not present

## 2014-11-14 DIAGNOSIS — E785 Hyperlipidemia, unspecified: Secondary | ICD-10-CM | POA: Diagnosis not present

## 2014-11-14 DIAGNOSIS — R110 Nausea: Secondary | ICD-10-CM | POA: Insufficient documentation

## 2014-11-14 DIAGNOSIS — R740 Nonspecific elevation of levels of transaminase and lactic acid dehydrogenase [LDH]: Secondary | ICD-10-CM | POA: Insufficient documentation

## 2014-11-14 DIAGNOSIS — G4733 Obstructive sleep apnea (adult) (pediatric): Secondary | ICD-10-CM | POA: Diagnosis not present

## 2014-11-14 DIAGNOSIS — Z87891 Personal history of nicotine dependence: Secondary | ICD-10-CM | POA: Insufficient documentation

## 2014-11-14 DIAGNOSIS — J45909 Unspecified asthma, uncomplicated: Secondary | ICD-10-CM | POA: Diagnosis not present

## 2014-11-14 DIAGNOSIS — I4891 Unspecified atrial fibrillation: Secondary | ICD-10-CM | POA: Diagnosis not present

## 2014-11-14 DIAGNOSIS — I10 Essential (primary) hypertension: Secondary | ICD-10-CM

## 2014-11-14 DIAGNOSIS — R109 Unspecified abdominal pain: Secondary | ICD-10-CM

## 2014-11-14 DIAGNOSIS — R7401 Elevation of levels of liver transaminase levels: Secondary | ICD-10-CM

## 2014-11-14 LAB — BASIC METABOLIC PANEL
BUN: 10 mg/dL (ref 6–23)
CALCIUM: 9.3 mg/dL (ref 8.4–10.5)
CO2: 28 mEq/L (ref 19–32)
Chloride: 102 mEq/L (ref 96–112)
Creatinine, Ser: 0.67 mg/dL (ref 0.40–1.20)
GFR: 119.76 mL/min (ref >60.00)
Glucose, Bld: 229 mg/dL — ABNORMAL HIGH (ref 70–99)
Potassium: 3.7 mEq/L (ref 3.5–5.1)
Sodium: 139 mEq/L (ref 135–145)

## 2014-11-14 LAB — CBC WITH DIFFERENTIAL/PLATELET
BASOS PCT: 0.6 % (ref 0.0–3.0)
Basophils Absolute: 0 10*3/uL (ref 0.0–0.1)
EOS ABS: 0 10*3/uL (ref 0.0–0.7)
EOS PCT: 0.6 % (ref 0.0–5.0)
HEMATOCRIT: 37.9 % (ref 36.0–46.0)
Hemoglobin: 12.6 g/dL (ref 12.0–15.0)
LYMPHS ABS: 1.7 10*3/uL (ref 0.7–4.0)
Lymphocytes Relative: 34.5 % (ref 12.0–46.0)
MCHC: 33.2 g/dL (ref 30.0–36.0)
MCV: 86.6 fl (ref 78.0–100.0)
MONO ABS: 0.3 10*3/uL (ref 0.1–1.0)
Monocytes Relative: 6.4 % (ref 3.0–12.0)
NEUTROS ABS: 2.9 10*3/uL (ref 1.4–7.7)
NEUTROS PCT: 57.9 % (ref 43.0–77.0)
Platelets: 287 10*3/uL (ref 150.0–400.0)
RBC: 4.38 Mil/uL (ref 3.87–5.11)
RDW: 13.6 % (ref 11.5–15.5)
WBC: 5 10*3/uL (ref 4.0–10.5)

## 2014-11-14 LAB — TSH: TSH: 0.29 u[IU]/mL — ABNORMAL LOW (ref 0.35–4.50)

## 2014-11-14 LAB — T4, FREE: Free T4: 1.01 ng/dL (ref 0.60–1.60)

## 2014-11-14 LAB — D-DIMER, QUANTITATIVE: D-Dimer, Quant: 0.33 ug/mL-FEU (ref 0.00–0.48)

## 2014-11-14 LAB — BRAIN NATRIURETIC PEPTIDE: Pro B Natriuretic peptide (BNP): 19 pg/mL (ref 0.0–100.0)

## 2014-11-14 NOTE — Addendum Note (Signed)
Addended by: Harland German A on: 11/14/2014 12:11 PM   Modules accepted: Orders

## 2014-11-14 NOTE — Progress Notes (Signed)
Cardiology Office Note   Date:  11/14/2014   ID:  Crystal Peck, DOB 1965/09/20, MRN [id number]  PCP:  [name], MD    Chief Complaint  Patient presents with  . Follow-up    PAF, c/o dizziness and increased HR      History of Present Illness: Crystal Peck is a 49 y.o. female with a history of type II DM, HTN, dyslipidemia and GERD and PAF. She was started on Xarelto for CHADS2VASC score of 3. She has a history of hyperthyroidism. She is having some problems with her heart racing at night and during the day.  She also is having some problems with SOB.  She occasionally wakes up gasping for breath at night.  She says that sometimes she will be sitting and will have a pain in her chest and when she is exerting herself she will have pressure in her chest.  She was diagnosed with OSA but is not using her CPAP machine.The last time she used .  She has been having some LE edema as well mainly in the LLE.   Past Medical History  Diagnosis Date  . Asthma   . Diabetes mellitus   . Hyperlipidemia   . Hypertension   . GERD (gastroesophageal reflux disease)   . Cerebellar tumor   . Obesity   . Sleep apnea   . A-fib   . Thyroid disease   . Chest pain 03/27/2014    negative lexiscan myoview    Past Surgical History  Procedure Laterality Date  . Cholecystectomy    . Partial hysterectomy      ovaries remain  . Nasal sinus surgery      Dr. Lyn Hollingshead  . Abdominal hysterectomy       Current Outpatient Prescriptions  Medication Sig Dispense Refill  . albuterol (PROVENTIL HFA;VENTOLIN HFA) 108 (90 BASE) MCG/ACT inhaler Inhale 2 puffs into the lungs every 4 (four) hours as needed for wheezing or shortness of breath.    . ALPRAZolam (XANAX) 0.5 MG tablet Take 1 tablet (0.5 mg total) by mouth 2 (two) times daily as needed for anxiety. 30 tablet 1  . amLODipine (NORVASC) 5 MG tablet Take 1 tablet (5 mg total) by mouth daily. 30 tablet 3  . atenolol  (TENORMIN) 100 MG tablet Take 1 tablet (100 mg total) by mouth every evening. 90 tablet 0  . atenolol (TENORMIN) 25 MG tablet Take 1 tablet (25 mg total) by mouth daily as needed. 15 tablet 2  . atorvastatin (LIPITOR) 40 MG tablet Take 1 tablet (40 mg total) by mouth daily. 90 tablet 1  . azelastine (OPTIVAR) 0.05 % ophthalmic solution Place 1 drop into both eyes 2 (two) times daily. 6 mL 6  . beclomethasone (QVAR) 80 MCG/ACT inhaler Inhale 3 puffs into the lungs daily. 1 Inhaler 3  . cetirizine (ZYRTEC) 10 MG tablet Take 1 tablet (10 mg total) by mouth daily. 30 tablet 3  . escitalopram (LEXAPRO) 10 MG tablet Take 1 tablet (10 mg total) by mouth daily. 30 tablet 3  . glipiZIDE (GLUCOTROL XL) 10 MG 24 hr tablet Take 10 mg by mouth daily with breakfast.    . glucose blood test strip 1 each by Other route as needed. Onetouch ultra test strip     . INVOKANA 100 MG TABS tablet Take 1 tablet by mouth daily.  3  . IRON PO Take 2  tablets by mouth 3 (three) times a week.     Marland Kitchen LANTUS SOLOSTAR 100 UNIT/ML Solostar Pen Inject 32 Units into the skin every morning. AS DIRECTED  5  . lisinopril-hydrochlorothiazide (PRINZIDE,ZESTORETIC) 10-12.5 MG per tablet Take 1 tablet by mouth 2 (two) times daily. 60 tablet 3  . metFORMIN (GLUCOPHAGE) 1000 MG tablet Take 1,000 mg by mouth 2 (two) times daily with a meal.    . methimazole (TAPAZOLE) 5 MG tablet Take 7.5 mg by mouth every evening.   2  . Multiple Vitamin (MULTIVITAMIN WITH MINERALS) TABS tablet Take 1 tablet by mouth every evening.     . Omega-3 Fatty Acids (FISH OIL) 1000 MG CAPS Take 2,000 mg by mouth every evening.     . ondansetron (ZOFRAN ODT) 4 MG disintegrating tablet Take 1 tablet (4 mg total) by mouth every 8 (eight) hours as needed for nausea or vomiting. 20 tablet 0  . pantoprazole (PROTONIX) 40 MG tablet Take 1 tablet (40 mg total) by mouth daily. 90 tablet 3  . potassium chloride SA (K-DUR,KLOR-CON) 20 MEQ tablet Take 20 mEq by mouth daily as  needed (for leg cramping). Take one tablet by mouth daily.    . rivaroxaban (XARELTO) 20 MG TABS tablet Take 1 tablet (20 mg total) by mouth daily with supper. 30 tablet 3   No current facility-administered medications for this visit.    Allergies:   Review of patient's allergies indicates no known allergies.    Social History:  The patient  reports that she has never smoked. She has never used smokeless tobacco. She reports that she does not drink alcohol or use illicit drugs.   Family History:  The patient's family history includes Coronary artery disease in her father; Diabetes in an other family member; Hyperlipidemia in her other; Hypertension in her father.    ROS:  Please see the history of present illness.   Otherwise, review of systems are positive for none.   All other systems are reviewed and negative.    PHYSICAL EXAM: VS:  BP 162/82 mmHg  Pulse 97  Ht 5\' 3"  (1.6 m)  Wt 183 lb 9.6 oz (83.28 kg)  BMI 32.53 kg/m2  SpO2 96% , BMI Body mass index is 32.53 kg/(m^2). GEN: Well nourished, well developed, in no acute distress HEENT: normal Neck: no JVD, carotid bruits, or masses Cardiac: RRR; no murmurs, rubs, or gallops.  1+ edema LLE Respiratory:  clear to auscultation bilaterally, normal work of breathing GI: soft, nontender, nondistended, + BS MS: no deformity or atrophy Skin: warm and dry, no rash Neuro:  Strength and sensation are intact Psych: euthymic mood, full affect   EKG:  EKG was ordered today and showed NSR with no ST changes    Recent Labs: 03/27/2014: Magnesium 2.1 07/02/2014: TSH 0.23* 09/06/2014: ALT 40; Hemoglobin 11.5*; Platelets 265 10/29/2014: BUN 13; Creatinine, Ser 0.67; Potassium 3.5; Sodium 137    Lipid Panel    Component Value Date/Time   CHOL 155 07/02/2014 1618   TRIG 100.0 07/02/2014 1618   HDL 39.60 07/02/2014 1618   CHOLHDL 4 07/02/2014 1618   VLDL 20.0 07/02/2014 1618   LDLCALC 95 07/02/2014 1618   LDLDIRECT 183.1 02/24/2013  0842      Wt Readings from Last 3 Encounters:  11/14/14 183 lb 9.6 oz (83.28 kg)  11/06/14 188 lb 3.2 oz (85.367 kg)  10/29/14 186 lb 2 oz (84.426 kg)     ASSESSMENT AND PLAN: 1. History of chest pain with  negative myoview but still having episodes of discomfort that can exertional or nonexertional.  I wll get a coronary CTA with calcium score and morphology to assess for CAD. 2. Paroxysmal atrial fibrillation maintaining NSR but complaining of frequent palpitations. I will get a 30 day monitor to assess for breakthrough. Continue Tenormin/Xarelto. 3. HTN well controlled. Continue BB/ACE I 4. Hyperthyroidism- followed by PCP now back on Methimazole.  I will check a Free T4 and TSH to make sure she is euthyroid. 5.  SOB of unclear etiology.  Her lungs are clear on exam today.  I will get a 2D echo to reassess LVF.  Check D-Dimer to rule out PE.  Check BMET and CBC. ? Whether some of this may be due to anxiety.  She has asked for time off from work due to SOB and anxiety at work.  I have told her we can keep her out from work while undergoing cardiac w/u but if this is normal then she will need to talk with her PCP about her anxiety. 6.  Left LE edema - LLE venous doppler negative for DVT in our office today  Current medicines are reviewed at length with the patient today.  The patient does not have concerns regarding medicines.  The following changes have been made:  no change  Labs/ tests ordered today: See above Assessment and Plan No orders of the defined types were placed in this encounter.     Disposition:   FU with me in 6 months  Signed, Sueanne Margarita, MD  11/14/2014 8:13 AM    [facility] [address], [place], [place]  [address] Phone: [phone]; Fax: [phone]

## 2014-11-14 NOTE — Patient Instructions (Addendum)
Medication Instructions:  Your physician recommends that you continue on your current medications as directed. Please refer to the Current Medication list given to you today.   Labwork: TODAY: BMET, CBC, TSH, Free T4, Ddimer  Testing/Procedures: Your physician has requested that you have an echocardiogram. Echocardiography is a painless test that uses sound waves to create images of your heart. It provides your doctor with information about the size and shape of your heart and how well your heart's chambers and valves are working. This procedure takes approximately one hour. There are no restrictions for this procedure.  Your physician has recommended that you wear an event monitor. Event monitors are medical devices that record the heart's electrical activity. Doctors most often [place] these monitors to diagnose arrhythmias. Arrhythmias are problems with the speed or rhythm of the heartbeat. The monitor is a small, portable device. You can wear one while you do your normal daily activities. This is usually used to diagnose what is causing palpitations/syncope (passing out).  Dr. Radford Pax recommends you have a CORONARY CTA.  Follow-Up: Your physician wants you to follow-up in: 6 months with Dr. Radford Pax. You will receive a reminder letter in the mail two months in advance. If you don't receive a letter, please call our office to schedule the follow-up appointment.   Any Other Special Instructions Will Be Listed Below (If Applicable).

## 2014-11-14 NOTE — ED Notes (Signed)
The pt is c/o  Rt flank pain tonight while walking.  She was given fentanyl 144mcg by the paramedics.  Iv per ems.  No other pain.  She was just seen by dr turner earlier today and a holter mkinitior was placed on her.  She has had swollen extremikties but her blood work   Today was normal  lmp none

## 2014-11-14 NOTE — ED Provider Notes (Signed)
CSN: [id number]     Arrival date & time 11/14/14  2343 History  This chart was scribed for Delora Fuel, MD by Hansel Feinstein, ED Scribe. This patient was seen in room A06C/A06C and the patient's care was started at 11:56 PM.     Chief Complaint  Patient presents with  . Flank Pain   The history is provided by the patient. No language interpreter was used.    HPI Comments: Crystal Peck is a 49 y.o. female BIBA with Hx of DM, HLD, asthma, HTN, GERD, Afib, thyroid disease who presents to the Emergency Department complaining of moderate, intermittent, sharp RUQ abdominal 8.5/10 pain that radiates to the lower back onset one week ago and worsened today. She states associated nausea. Pt reports that the pain and nausea is worst in the morning. Pt states that she was seen by Dr. Radford Pax today where she had testing done and received a heart monitor. She states that she was with her grandson when she felt a sharp pain. Pt states that pain was relieved by pain medicine (given in the ambulance) and worsened by lying on her right side. She notes that she believed the pain was related to a UTI, and has taken cranberry pills with no relief. Pt is a non-smoker and non-drinker. She denies vomiting and difficulty urinating.   Past Medical History  Diagnosis Date  . Asthma   . Diabetes mellitus   . Hyperlipidemia   . Hypertension   . GERD (gastroesophageal reflux disease)   . Cerebellar tumor   . Obesity   . Sleep apnea   . A-fib   . Thyroid disease   . Chest pain 03/27/2014    negative lexiscan myoview   Past Surgical History  Procedure Laterality Date  . Cholecystectomy    . Partial hysterectomy      ovaries remain  . Nasal sinus surgery      Dr. Lyn Hollingshead  . Abdominal hysterectomy     Family History  Problem Relation Age of Onset  . Coronary artery disease Father   . Hypertension Father   . Diabetes      maternal grandparents  . Hyperlipidemia Other    History  Substance Use Topics  .  Smoking status: Never Smoker   . Smokeless tobacco: Never Used  . Alcohol Use: No   OB History    No data available     Review of Systems  Gastrointestinal: Positive for nausea and abdominal pain. Negative for vomiting.  Genitourinary: Negative for difficulty urinating.  Musculoskeletal: Positive for back pain.  All other systems reviewed and are negative.   Allergies  Review of patient's allergies indicates no known allergies.  Home Medications   Prior to Admission medications   Medication Sig Start Date End Date Taking? Authorizing Provider  albuterol (PROVENTIL HFA;VENTOLIN HFA) 108 (90 BASE) MCG/ACT inhaler Inhale 2 puffs into the lungs every 4 (four) hours as needed for wheezing or shortness of breath.    Historical Provider, MD  ALPRAZolam Duanne Moron) 0.5 MG tablet Take 1 tablet (0.5 mg total) by mouth 2 (two) times daily as needed for anxiety. 10/29/14   Midge Minium, MD  amLODipine (NORVASC) 5 MG tablet Take 1 tablet (5 mg total) by mouth daily. 10/29/14   Midge Minium, MD  atenolol (TENORMIN) 100 MG tablet Take 1 tablet (100 mg total) by mouth every evening. 10/15/14   Midge Minium, MD  atenolol (TENORMIN) 25 MG tablet Take 1 tablet (25 mg total)  by mouth daily as needed. 09/13/14   Sueanne Margarita, MD  atorvastatin (LIPITOR) 40 MG tablet Take 1 tablet (40 mg total) by mouth daily. 10/15/14   Midge Minium, MD  azelastine (OPTIVAR) 0.05 % ophthalmic solution Place 1 drop into both eyes 2 (two) times daily. 10/15/14   Midge Minium, MD  beclomethasone (QVAR) 80 MCG/ACT inhaler Inhale 3 puffs into the lungs daily. 10/12/14   Colon Branch, MD  cetirizine (ZYRTEC) 10 MG tablet Take 1 tablet (10 mg total) by mouth daily. 10/15/14   Midge Minium, MD  escitalopram (LEXAPRO) 10 MG tablet Take 1 tablet (10 mg total) by mouth daily. 10/15/14   Midge Minium, MD  glipiZIDE (GLUCOTROL XL) 10 MG 24 hr tablet Take 10 mg by mouth daily with breakfast.    Historical  Provider, MD  glucose blood test strip 1 each by Other route as needed. Onetouch ultra test strip     Historical Provider, MD  INVOKANA 100 MG TABS tablet Take 1 tablet by mouth daily. 02/27/14   Historical Provider, MD  IRON PO Take 2 tablets by mouth 3 (three) times a week.     Historical Provider, MD  LANTUS SOLOSTAR 100 UNIT/ML Solostar Pen Inject 32 Units into the skin every morning. AS DIRECTED 02/27/14   Historical Provider, MD  lisinopril-hydrochlorothiazide (PRINZIDE,ZESTORETIC) 10-12.5 MG per tablet Take 1 tablet by mouth 2 (two) times daily. 10/29/14   Midge Minium, MD  metFORMIN (GLUCOPHAGE) 1000 MG tablet Take 1,000 mg by mouth 2 (two) times daily with a meal.    Historical Provider, MD  methimazole (TAPAZOLE) 5 MG tablet Take 7.5 mg by mouth every evening.  02/27/14   Historical Provider, MD  Multiple Vitamin (MULTIVITAMIN WITH MINERALS) TABS tablet Take 1 tablet by mouth every evening.     Historical Provider, MD  Omega-3 Fatty Acids (FISH OIL) 1000 MG CAPS Take 2,000 mg by mouth every evening.     Historical Provider, MD  ondansetron (ZOFRAN ODT) 4 MG disintegrating tablet Take 1 tablet (4 mg total) by mouth every 8 (eight) hours as needed for nausea or vomiting. 06/19/14   Pleas Koch, NP  pantoprazole (PROTONIX) 40 MG tablet Take 1 tablet (40 mg total) by mouth daily. 06/07/14   Midge Minium, MD  potassium chloride SA (K-DUR,KLOR-CON) 20 MEQ tablet Take 20 mEq by mouth daily as needed (for leg cramping). Take one tablet by mouth daily. 02/26/12   Midge Minium, MD  rivaroxaban (XARELTO) 20 MG TABS tablet Take 1 tablet (20 mg total) by mouth daily with supper. 07/02/14   Midge Minium, MD   BP 185/75 mmHg  Pulse 69  Resp 13  Ht 5\' 1"  (1.549 m)  SpO2 99% Physical Exam  Constitutional: She is oriented to person, place, and time. She appears well-developed and well-nourished.  Appears uncomfortable.   HENT:  Head: Normocephalic and atraumatic.  Eyes: EOM  are normal. Pupils are equal, round, and reactive to light.  Neck: Normal range of motion. Neck supple. No JVD present.  Cardiovascular: Normal rate, regular rhythm and normal heart sounds.   No murmur heard. Pulmonary/Chest: Effort normal and breath sounds normal. She has no wheezes. She has no rales. She exhibits no tenderness.  Abdominal: Soft. She exhibits no distension and no mass. There is tenderness. There is no rebound and no guarding.  Abdominal tenderness in the RUQ and right CVA. Bowel sounds decreased.   Musculoskeletal: Normal range of motion.  She exhibits no edema.  Lymphadenopathy:    She has no cervical adenopathy.  Neurological: She is alert and oriented to person, place, and time. No cranial nerve deficit. She exhibits normal muscle tone. Coordination normal.  Skin: Skin is warm and dry. No rash noted.  Psychiatric: She has a normal mood and affect. Her behavior is normal. Judgment and thought content normal.  Nursing note and vitals reviewed.   ED Course  Procedures (including critical care time) DIAGNOSTIC STUDIES: Oxygen Saturation is 99% on RA, normal by my interpretation.    COORDINATION OF CARE: 12:03 AM Discussed treatment plan with pt at bedside and pt agreed to plan.   Labs Review Results for orders placed or performed during the hospital encounter of 11/14/14  Lipase, blood  Result Value Ref Range   Lipase 25 22 - 51 U/L  CBC with Differential  Result Value Ref Range   WBC 6.9 4.0 - 10.5 K/uL   RBC 4.28 3.87 - 5.11 MIL/uL   Hemoglobin 12.5 12.0 - 15.0 g/dL   HCT 36.8 36.0 - 46.0 %   MCV 86.0 78.0 - 100.0 fL   MCH 29.2 26.0 - 34.0 pg   MCHC 34.0 30.0 - 36.0 g/dL   RDW 12.6 11.5 - 15.5 %   Platelets 287 150 - 400 K/uL   Neutrophils Relative % 47 43 - 77 %   Neutro Abs 3.2 1.7 - 7.7 K/uL   Lymphocytes Relative 43 12 - 46 %   Lymphs Abs 3.0 0.7 - 4.0 K/uL   Monocytes Relative 8 3 - 12 %   Monocytes Absolute 0.6 0.1 - 1.0 K/uL   Eosinophils  Relative 1 0 - 5 %   Eosinophils Absolute 0.1 0.0 - 0.7 K/uL   Basophils Relative 1 0 - 1 %   Basophils Absolute 0.0 0.0 - 0.1 K/uL  Comprehensive metabolic panel  Result Value Ref Range   Sodium 137 135 - 145 mmol/L   Potassium 3.1 (L) 3.5 - 5.1 mmol/L   Chloride 103 101 - 111 mmol/L   CO2 24 22 - 32 mmol/L   Glucose, Bld 261 (H) 65 - 99 mg/dL   BUN 12 6 - 20 mg/dL   Creatinine, Ser 0.75 0.44 - 1.00 mg/dL   Calcium 8.9 8.9 - 10.3 mg/dL   Total Protein 7.1 6.5 - 8.1 g/dL   Albumin 3.8 3.5 - 5.0 g/dL   AST 81 (H) 15 - 41 U/L   ALT 50 14 - 54 U/L   Alkaline Phosphatase 97 38 - 126 U/L   Total Bilirubin 1.4 (H) 0.3 - 1.2 mg/dL   GFR calc non Af Amer >60 >60 mL/min   GFR calc Af Amer >60 >60 mL/min   Anion gap 10 5 - 15  I-Stat CG4 Lactic Acid, ED  Result Value Ref Range   Lactic Acid, Venous 1.51 0.5 - 2.0 mmol/L   Imaging Review Ct Abdomen Pelvis W Contrast  11/15/2014   CLINICAL DATA:  Right flank pain tonight, onset while walking  EXAM: CT ABDOMEN AND PELVIS WITH CONTRAST  TECHNIQUE: Multidetector CT imaging of the abdomen and pelvis was performed using the standard protocol following bolus administration of intravenous contrast.  CONTRAST:  119mL OMNIPAQUE IOHEXOL 300 MG/ML  SOLN  COMPARISON:  None.  FINDINGS: The appendix is normal. No acute inflammatory changes are evident in the abdomen or pelvis. Bowel is unremarkable. No urinary calculi are evident. There is no hydronephrosis or ureteral dilatation. The abdominal aorta is normal  in caliber with mild atherosclerotic calcification.  There is cholecystectomy. The liver and bile ducts appear normal. The pancreas appears normal. Spleen, adrenals and kidneys appear normal. There are a few nonspecific retroperitoneal nodes, the largest measuring 9 mm in the aortocaval region 3-4 cm above the aortic bifurcation.  No significant abnormality is evident in the lower chest. No significant musculoskeletal abnormality is evident. Incidentally  noted fat containing umbilical hernia  IMPRESSION: 1. No acute changes are evident.  Normal appendix. 2. There are a few nonspecific retroperitoneal nodes, indeterminate. 3. Small fat containing umbilical hernia   Electronically Signed   By: Andreas Newport M.D.   On: 11/15/2014 02:39     EKG Interpretation   Date/Time:  Wednesday November 14 2014 23:48:20 EDT Ventricular Rate:  72 PR Interval:  135 QRS Duration: 89 QT Interval:  390 QTC Calculation: 427 R Axis:   8 Text Interpretation:  Sinus rhythm Abnormal R-wave progression, early  transition When compared with ECG of 09/06/2014, No significant change was  found Confirmed by [facility]  MD, Falcon Mccaskey ([id number]) on 11/14/2014 11:53:41 PM      MDM   Final diagnoses:  Abdominal pain, unspecified abdominal location  Elevated bilirubin  Elevated transaminase level    Abdominal pain of uncertain cause. 57 of old records shows that she is status post cholecystectomy. She did have CT scan in April of this year showing no evidence of nephrolithiasis, so urolithiasis is very unlikely. She is given a dose of hydromorphone and ondansetron and feels much better. She is sent for CT of the abdomen and pelvis showing no acute process. Laboratory workup is significant for mild elevation of AST and bilirubin of uncertain cause. No evidence of bile duct patient or intrahepatic duct dilatation seen on CT scan. She is discharged with prescription for oxycodone-acetaminophen, and ondansetron. Follow-up with PCP. Return if symptoms worsen.  I personally performed the services described in this documentation, which was scribed in my presence. The recorded information has been reviewed and is accurate.      Delora Fuel, MD 82/42/35 [id number]

## 2014-11-14 NOTE — Progress Notes (Addendum)
Received stat lab result from [place] at [facility]. DDimer stat lab results reported as  0.33. Dr. Radford Pax made aware.

## 2014-11-15 ENCOUNTER — Encounter (HOSPITAL_COMMUNITY): Payer: Self-pay | Admitting: Radiology

## 2014-11-15 ENCOUNTER — Telehealth: Payer: Self-pay | Admitting: Cardiology

## 2014-11-15 ENCOUNTER — Emergency Department (HOSPITAL_COMMUNITY): Payer: 59

## 2014-11-15 LAB — COMPREHENSIVE METABOLIC PANEL
ALT: 50 U/L (ref 14–54)
ANION GAP: 10 (ref 5–15)
AST: 81 U/L — AB (ref 15–41)
Albumin: 3.8 g/dL (ref 3.5–5.0)
Alkaline Phosphatase: 97 U/L (ref 38–126)
BUN: 12 mg/dL (ref 6–20)
CALCIUM: 8.9 mg/dL (ref 8.9–10.3)
CHLORIDE: 103 mmol/L (ref 101–111)
CO2: 24 mmol/L (ref 22–32)
CREATININE: 0.75 mg/dL (ref 0.44–1.00)
GFR calc non Af Amer: >60 mL/min (ref >60)
Glucose, Bld: 261 mg/dL — ABNORMAL HIGH (ref 65–99)
Potassium: 3.1 mmol/L — ABNORMAL LOW (ref 3.5–5.1)
SODIUM: 137 mmol/L (ref 135–145)
TOTAL PROTEIN: 7.1 g/dL (ref 6.5–8.1)
Total Bilirubin: 1.4 mg/dL — ABNORMAL HIGH (ref 0.3–1.2)

## 2014-11-15 LAB — I-STAT CG4 LACTIC ACID, ED: Lactic Acid, Venous: 1.51 mmol/L (ref 0.5–2.0)

## 2014-11-15 LAB — CBC WITH DIFFERENTIAL/PLATELET
BASOS ABS: 0 10*3/uL (ref 0.0–0.1)
Basophils Relative: 1 % (ref 0–1)
Eosinophils Absolute: 0.1 10*3/uL (ref 0.0–0.7)
Eosinophils Relative: 1 % (ref 0–5)
HCT: 36.8 % (ref 36.0–46.0)
Hemoglobin: 12.5 g/dL (ref 12.0–15.0)
LYMPHS PCT: 43 % (ref 12–46)
Lymphs Abs: 3 10*3/uL (ref 0.7–4.0)
MCH: 29.2 pg (ref 26.0–34.0)
MCHC: 34 g/dL (ref 30.0–36.0)
MCV: 86 fL (ref 78.0–100.0)
MONO ABS: 0.6 10*3/uL (ref 0.1–1.0)
Monocytes Relative: 8 % (ref 3–12)
NEUTROS ABS: 3.2 10*3/uL (ref 1.7–7.7)
Neutrophils Relative %: 47 % (ref 43–77)
PLATELETS: 287 10*3/uL (ref 150–400)
RBC: 4.28 MIL/uL (ref 3.87–5.11)
RDW: 12.6 % (ref 11.5–15.5)
WBC: 6.9 10*3/uL (ref 4.0–10.5)

## 2014-11-15 LAB — LIPASE, BLOOD: Lipase: 25 U/L (ref 22–51)

## 2014-11-15 MED ORDER — HYDROMORPHONE HCL 1 MG/ML IJ SOLN
1.0000 mg | Freq: Once | INTRAMUSCULAR | Status: AC
  Administered 2014-11-15: 1 mg via INTRAVENOUS
  Filled 2014-11-15: qty 1

## 2014-11-15 MED ORDER — IOHEXOL 300 MG/ML  SOLN
25.0000 mL | Freq: Once | INTRAMUSCULAR | Status: AC | PRN
  Administered 2014-11-15: 25 mL via ORAL

## 2014-11-15 MED ORDER — OXYCODONE-ACETAMINOPHEN 5-325 MG PO TABS: 1.0000 | ORAL_TABLET | ORAL | Status: DC | PRN

## 2014-11-15 MED ORDER — SODIUM CHLORIDE 0.9 % IV SOLN: 1000.0000 mL | INTRAVENOUS | Status: DC

## 2014-11-15 MED ORDER — IOHEXOL 300 MG/ML  SOLN
100.0000 mL | Freq: Once | INTRAMUSCULAR | Status: AC | PRN
  Administered 2014-11-15: 100 mL via INTRAVENOUS

## 2014-11-15 MED ORDER — SODIUM CHLORIDE 0.9 % IV SOLN
1000.0000 mL | Freq: Once | INTRAVENOUS | Status: AC
  Administered 2014-11-15: 1000 mL via INTRAVENOUS

## 2014-11-15 MED ORDER — ONDANSETRON HCL 4 MG PO TABS
4.0000 mg | ORAL_TABLET | Freq: Four times a day (QID) | ORAL | Status: DC | PRN
Start: 2014-11-15 — End: 2014-12-20

## 2014-11-15 MED ORDER — ONDANSETRON HCL 4 MG/2ML IJ SOLN
4.0000 mg | Freq: Once | INTRAMUSCULAR | Status: AC
  Administered 2014-11-15: 4 mg via INTRAVENOUS
  Filled 2014-11-15: qty 2

## 2014-11-15 NOTE — Telephone Encounter (Signed)
New message     Pt has questions about medical leave paperwork and questions about test that were supposed to be scheduled at the hospital Please call to discuss

## 2014-11-15 NOTE — ED Notes (Signed)
Called main lab in regards to lipase and cmp results. Currently in process after speaking to [place].

## 2014-11-15 NOTE — Telephone Encounter (Signed)
Patient requesting Dr. Radford Pax to write a "permanent work excuse" for her when she has afib or OSA problems.  Informed the patient that Dr. Radford Pax will not do this, but she does have a letter to be out of work until next Thursday. Patient st that for this week, her employer will need paperwork. Instructed the patient to have her employer send [place] the paperwork and we will fill out anything she needs for this week only. Patient agrees with treatment plan.

## 2014-11-15 NOTE — Discharge Instructions (Signed)
Return if pain gets worse, you start running a fever, or if you are vomiting in spite of the nausea medication.   Abdominal Pain Many things can cause abdominal pain. Usually, abdominal pain is not caused by a disease and will improve without treatment. It can often be observed and treated at home. Your health care provider will do a physical exam and possibly order blood tests and X-rays to help determine the seriousness of your pain. However, in many cases, more time must pass before a clear cause of the pain can be found. Before that point, your health care provider may not know if you need more testing or further treatment. HOME CARE INSTRUCTIONS  Monitor your abdominal pain for any changes. The following actions may help to alleviate any discomfort you are experiencing:  Only take over-the-counter or prescription medicines as directed by your health care provider.  Do not take laxatives unless directed to do so by your health care provider.  Try a clear liquid diet (broth, tea, or water) as directed by your health care provider. Slowly move to a bland diet as tolerated. SEEK MEDICAL CARE IF:  You have unexplained abdominal pain.  You have abdominal pain associated with nausea or diarrhea.  You have pain when you urinate or have a bowel movement.  You experience abdominal pain that wakes you in the night.  You have abdominal pain that is worsened or improved by eating food.  You have abdominal pain that is worsened with eating fatty foods.  You have a fever. SEEK IMMEDIATE MEDICAL CARE IF:   Your pain does not go away within 2 hours.  You keep throwing up (vomiting).  Your pain is felt only in portions of the abdomen, such as the right side or the left lower portion of the abdomen.  You pass bloody or black tarry stools. MAKE SURE YOU:  Understand these instructions.   Will watch your condition.   Will get help right away if you are not doing well or get worse.   Document Released: 01/14/2005 Document Revised: 04/11/2013 Document Reviewed: 12/14/2012 [facility] Patient Information 2015 [place], [place]. This information is not intended to replace advice given to you by your health care provider. Make sure you discuss any questions you have with your health care provider.  Ondansetron tablets What is this medicine? ONDANSETRON (on DAN se tron) is used to treat nausea and vomiting caused by chemotherapy. It is also used to prevent or treat nausea and vomiting after surgery. This medicine may be used for other purposes; ask your health care provider or pharmacist if you have questions. COMMON BRAND NAME(S): Zofran What should I tell my health care provider before I take this medicine? They need to know if you have any of these conditions: -heart disease -history of irregular heartbeat -liver disease -low levels of magnesium or potassium in the blood -an unusual or allergic reaction to ondansetron, granisetron, other medicines, foods, dyes, or preservatives -pregnant or trying to get pregnant -breast-feeding How should I use this medicine? Take this medicine by mouth with a glass of water. Follow the directions on your prescription label. Take your doses at regular intervals. Do not take your medicine more often than directed. Talk to your pediatrician regarding the use of this medicine in children. Special care may be needed. Overdosage: If you think you have taken too much of this medicine contact a poison control center or emergency room at once. NOTE: This medicine is only for you. Do not share  this medicine with others. What if I miss a dose? If you miss a dose, take it as soon as you can. If it is almost time for your next dose, take only that dose. Do not take double or extra doses. What may interact with this medicine? Do not take this medicine with any of the following medications: -apomorphine -certain medicines for fungal infections like  fluconazole, itraconazole, ketoconazole, posaconazole, voriconazole -cisapride -dofetilide -dronedarone -pimozide -thioridazine -ziprasidone This medicine may also interact with the following medications: -carbamazepine -certain medicines for depression, anxiety, or psychotic disturbances -fentanyl -linezolid -MAOIs like Carbex, Eldepryl, Marplan, Nardil, and Parnate -methylene blue (injected into a vein) -other medicines that prolong the QT interval (cause an abnormal heart rhythm) -phenytoin -rifampicin -tramadol This list may not describe all possible interactions. Give your health care provider a list of all the medicines, herbs, non-prescription drugs, or dietary supplements you use. Also tell them if you smoke, drink alcohol, or use illegal drugs. Some items may interact with your medicine. What should I watch for while using this medicine? Check with your doctor or health care professional right away if you have any sign of an allergic reaction. What side effects may I notice from receiving this medicine? Side effects that you should report to your doctor or health care professional as soon as possible: -allergic reactions like skin rash, itching or hives, swelling of the face, lips or tongue -breathing problems -confusion -dizziness -fast or irregular heartbeat -feeling faint or lightheaded, falls -fever and chills -loss of balance or coordination -seizures -sweating -swelling of the hands or feet -tightness in the chest -tremors -unusually weak or tired Side effects that usually do not require medical attention (report to your doctor or health care professional if they continue or are bothersome): -constipation or diarrhea -headache This list may not describe all possible side effects. Call your doctor for medical advice about side effects. You may report side effects to FDA at 1-800-FDA-1088. Where should I keep my medicine? Keep out of the reach of  children. Store between 2 and 30 degrees C (36 and 86 degrees F). Throw away any unused medicine after the expiration date. NOTE: This sheet is a summary. It may not cover all possible information. If you have questions about this medicine, talk to your doctor, pharmacist, or health care provider.  2015, [employer]vier/[employer] Standard. (2013-01-11 16:27:45)  Acetaminophen; Oxycodone tablets What is this medicine? ACETAMINOPHEN; OXYCODONE (a set a MEE noe fen; ox i KOE done) is a pain reliever. It is used to treat mild to moderate pain. This medicine may be used for other purposes; ask your health care provider or pharmacist if you have questions. COMMON BRAND NAME(S): Endocet, Magnacet, Narvox, Percocet, Perloxx, Primalev, Primlev, Roxicet, Xolox What should I tell my health care provider before I take this medicine? They need to know if you have any of these conditions: -brain tumor -Crohn's disease, inflammatory bowel disease, or ulcerative colitis -drug abuse or addiction -head injury -heart or circulation problems -if you often drink alcohol -kidney disease or problems going to the bathroom -liver disease -lung disease, asthma, or breathing problems -an unusual or allergic reaction to acetaminophen, oxycodone, other opioid analgesics, other medicines, foods, dyes, or preservatives -pregnant or trying to get pregnant -breast-feeding How should I use this medicine? Take this medicine by mouth with a full glass of water. Follow the directions on the prescription label. Take your medicine at regular intervals. Do not take your medicine more often than directed. Talk to your  pediatrician regarding the use of this medicine in children. Special care may be needed. Patients over 39 years old may have a stronger reaction and need a smaller dose. Overdosage: If you think you have taken too much of this medicine contact a poison control center or emergency room at once. NOTE: This medicine is only for  you. Do not share this medicine with others. What if I miss a dose? If you miss a dose, take it as soon as you can. If it is almost time for your next dose, take only that dose. Do not take double or extra doses. What may interact with this medicine? -alcohol -antihistamines -barbiturates like amobarbital, butalbital, butabarbital, methohexital, pentobarbital, phenobarbital, thiopental, and secobarbital -benztropine -drugs for bladder problems like solifenacin, trospium, oxybutynin, tolterodine, hyoscyamine, and methscopolamine -drugs for breathing problems like ipratropium and tiotropium -drugs for certain stomach or intestine problems like propantheline, homatropine methylbromide, glycopyrrolate, atropine, belladonna, and dicyclomine -general anesthetics like etomidate, ketamine, nitrous oxide, propofol, desflurane, enflurane, halothane, isoflurane, and sevoflurane -medicines for depression, anxiety, or psychotic disturbances -medicines for sleep -muscle relaxants -naltrexone -narcotic medicines (opiates) for pain -phenothiazines like perphenazine, thioridazine, chlorpromazine, mesoridazine, fluphenazine, prochlorperazine, promazine, and trifluoperazine -scopolamine -tramadol -trihexyphenidyl This list may not describe all possible interactions. Give your health care provider a list of all the medicines, herbs, non-prescription drugs, or dietary supplements you use. Also tell them if you smoke, drink alcohol, or use illegal drugs. Some items may interact with your medicine. What should I watch for while using this medicine? Tell your doctor or health care professional if your pain does not go away, if it gets worse, or if you have new or a different type of pain. You may develop tolerance to the medicine. Tolerance means that you will need a higher dose of the medication for pain relief. Tolerance is normal and is expected if you take this medicine for a long time. Do not suddenly stop  taking your medicine because you may develop a severe reaction. Your body becomes used to the medicine. This does NOT mean you are addicted. Addiction is a behavior related to getting and using a drug for a non-medical reason. If you have pain, you have a medical reason to take pain medicine. Your doctor will tell you how much medicine to take. If your doctor wants you to stop the medicine, the dose will be slowly lowered over time to avoid any side effects. You may get drowsy or dizzy. Do not drive, use machinery, or do anything that needs mental alertness until you know how this medicine affects you. Do not stand or sit up quickly, especially if you are an older patient. This reduces the risk of dizzy or fainting spells. Alcohol may interfere with the effect of this medicine. Avoid alcoholic drinks. There are different types of narcotic medicines (opiates) for pain. If you take more than one type at the same time, you may have more side effects. Give your health care provider a list of all medicines you use. Your doctor will tell you how much medicine to take. Do not take more medicine than directed. Call emergency for help if you have problems breathing. The medicine will cause constipation. Try to have a bowel movement at least every 2 to 3 days. If you do not have a bowel movement for 3 days, call your doctor or health care professional. Do not take Tylenol (acetaminophen) or medicines that have acetaminophen with this medicine. Too much acetaminophen can be very dangerous. Many nonprescription  medicines contain acetaminophen. Always read the labels carefully to avoid taking more acetaminophen. What side effects may I notice from receiving this medicine? Side effects that you should report to your doctor or health care professional as soon as possible: -allergic reactions like skin rash, itching or hives, swelling of the face, lips, or tongue -breathing difficulties, wheezing -confusion -light  headedness or fainting spells -severe stomach pain -unusually weak or tired -yellowing of the skin or the whites of the eyes Side effects that usually do not require medical attention (report to your doctor or health care professional if they continue or are bothersome): -dizziness -drowsiness -nausea -vomiting This list may not describe all possible side effects. Call your doctor for medical advice about side effects. You may report side effects to FDA at 1-800-FDA-1088. Where should I keep my medicine? Keep out of the reach of children. This medicine can be abused. Keep your medicine in a safe place to protect it from theft. Do not share this medicine with anyone. Selling or giving away this medicine is dangerous and against the law. Store at room temperature between 20 and 25 degrees C (68 and 77 degrees F). Keep container tightly closed. Protect from light. This medicine may cause accidental overdose and death if it is taken by other adults, children, or pets. Flush any unused medicine down the toilet to reduce the chance of harm. Do not use the medicine after the expiration date. NOTE: This sheet is a summary. It may not cover all possible information. If you have questions about this medicine, talk to your doctor, pharmacist, or health care provider.  2015, [employer]vier/Gold Standard. (2012-11-28 13:17:35)

## 2014-11-15 NOTE — ED Notes (Signed)
To ct and back

## 2014-11-15 NOTE — ED Notes (Signed)
Pt returned from c-t.  She is still  Sleeping from the pain meds in no distress at present.  Family went home

## 2014-11-15 NOTE — ED Notes (Signed)
The pt is alert  No complaints

## 2014-11-19 ENCOUNTER — Encounter: Payer: Self-pay | Admitting: General Practice

## 2014-11-19 ENCOUNTER — Encounter: Payer: Self-pay | Admitting: Cardiology

## 2014-11-21 ENCOUNTER — Encounter (HOSPITAL_COMMUNITY): Payer: Self-pay

## 2014-11-21 ENCOUNTER — Ambulatory Visit (HOSPITAL_COMMUNITY)
Admission: RE | Admit: 2014-11-21 | Discharge: 2014-11-21 | Disposition: A | Discharge status: Home / Self Care | Payer: 59 | Source: Ambulatory Visit | Attending: Cardiology | Admitting: Cardiology

## 2014-11-21 DIAGNOSIS — I2510 Atherosclerotic heart disease of native coronary artery without angina pectoris: Secondary | ICD-10-CM | POA: Insufficient documentation

## 2014-11-21 DIAGNOSIS — K760 Fatty (change of) liver, not elsewhere classified: Secondary | ICD-10-CM | POA: Insufficient documentation

## 2014-11-21 DIAGNOSIS — R0602 Shortness of breath: Secondary | ICD-10-CM | POA: Insufficient documentation

## 2014-11-21 DIAGNOSIS — R079 Chest pain, unspecified: Secondary | ICD-10-CM | POA: Insufficient documentation

## 2014-11-21 MED ORDER — NITROGLYCERIN 0.4 MG SL SUBL
0.8000 mg | SUBLINGUAL_TABLET | Freq: Once | SUBLINGUAL | Status: AC
  Administered 2014-11-21: 0.8 mg via SUBLINGUAL
  Filled 2014-11-21: qty 25

## 2014-11-21 MED ORDER — METOPROLOL TARTRATE 1 MG/ML IV SOLN
2.5000 mg | Freq: Once | INTRAVENOUS | Status: AC
  Administered 2014-11-21: 2.5 mg via INTRAVENOUS
  Filled 2014-11-21: qty 5

## 2014-11-21 MED ORDER — IOHEXOL 350 MG/ML SOLN
80.0000 mL | Freq: Once | INTRAVENOUS | Status: AC | PRN
  Administered 2014-11-21: 80 mL via INTRAVENOUS

## 2014-11-21 MED ORDER — NITROGLYCERIN 0.4 MG SL SUBL
SUBLINGUAL_TABLET | SUBLINGUAL | Status: AC
  Filled 2014-11-21: qty 2

## 2014-11-21 MED ORDER — METOPROLOL TARTRATE 1 MG/ML IV SOLN
INTRAVENOUS | Status: AC
  Filled 2014-11-21: qty 5

## 2014-11-21 MED ORDER — METOPROLOL TARTRATE 1 MG/ML IV SOLN
10.0000 mg | Freq: Once | INTRAVENOUS | Status: AC
Start: 2014-11-21 — End: 2014-11-21
  Administered 2014-11-21: 10 mg via INTRAVENOUS
  Filled 2014-11-21: qty 10

## 2014-11-21 MED ORDER — METOPROLOL TARTRATE 1 MG/ML IV SOLN
INTRAVENOUS | Status: AC
  Administered 2014-11-21: 10 mg via INTRAVENOUS
  Filled 2014-11-21: qty 10

## 2014-11-22 ENCOUNTER — Telehealth: Payer: Self-pay | Admitting: Cardiology

## 2014-11-22 DIAGNOSIS — E785 Hyperlipidemia, unspecified: Secondary | ICD-10-CM

## 2014-11-22 MED ORDER — ATORVASTATIN CALCIUM 80 MG PO TABS: 80.0000 mg | ORAL_TABLET | Freq: Every day | ORAL | Status: AC

## 2014-11-22 NOTE — Telephone Encounter (Signed)
-----   Message from Sueanne Margarita, MD sent at 11/21/2014  4:29 PM EDT ----- Mild plaque in the LAD with 25-50% stenosis o/w no CAD.  Needs aggressive risk factor modification.  Last LDL 95 so increase atorvastatin to 80mg  daily and recheck FLP and ALT In 6 weeks.  No cardiac indication to keep out of work

## 2014-11-22 NOTE — Telephone Encounter (Signed)
Informed patient of results and verbal understanding expressed.   Instructed patient to INCREASE ATORVASTATIN to 80 mg daily.  FLP and ALT scheduled for September 30. Encouraged aggressive risk factor modifications. Informed patient there is no cardiac indication to keep her out of work. Patient agrees with treatment plan.

## 2014-11-22 NOTE — Telephone Encounter (Signed)
New message      Returning Crystal Peck's call to get test results

## 2014-11-23 ENCOUNTER — Ambulatory Visit: Payer: 59 | Admitting: Family Medicine

## 2014-11-26 ENCOUNTER — Ambulatory Visit (INDEPENDENT_AMBULATORY_CARE_PROVIDER_SITE_OTHER): Payer: 59 | Admitting: Family Medicine

## 2014-11-26 ENCOUNTER — Encounter: Payer: Self-pay | Admitting: General Practice

## 2014-11-26 ENCOUNTER — Encounter: Payer: Self-pay | Admitting: Family Medicine

## 2014-11-26 VITALS — BP 142/86 | HR 93 | Temp 98.0°F | Resp 16 | Wt 182.4 lb

## 2014-11-26 DIAGNOSIS — E876 Hypokalemia: Secondary | ICD-10-CM | POA: Diagnosis not present

## 2014-11-26 DIAGNOSIS — R740 Nonspecific elevation of levels of transaminase and lactic acid dehydrogenase [LDH]: Secondary | ICD-10-CM

## 2014-11-26 DIAGNOSIS — I10 Essential (primary) hypertension: Secondary | ICD-10-CM | POA: Diagnosis not present

## 2014-11-26 DIAGNOSIS — R7402 Elevation of levels of lactic acid dehydrogenase (LDH): Secondary | ICD-10-CM

## 2014-11-26 DIAGNOSIS — E0590 Thyrotoxicosis, unspecified without thyrotoxic crisis or storm: Secondary | ICD-10-CM

## 2014-11-26 LAB — BASIC METABOLIC PANEL
BUN: 14 mg/dL (ref 6–23)
CHLORIDE: 101 meq/L (ref 96–112)
CO2: 28 meq/L (ref 19–32)
CREATININE: 0.78 mg/dL (ref 0.40–1.20)
Calcium: 9.2 mg/dL (ref 8.4–10.5)
GFR: 100.48 mL/min (ref >60.00)
GLUCOSE: 353 mg/dL — AB (ref 70–99)
Potassium: 3.3 mEq/L — ABNORMAL LOW (ref 3.5–5.1)
Sodium: 138 mEq/L (ref 135–145)

## 2014-11-26 LAB — HEPATIC FUNCTION PANEL
ALK PHOS: 99 U/L (ref 39–117)
ALT: 21 U/L (ref 0–35)
AST: 14 U/L (ref 0–37)
Albumin: 4 g/dL (ref 3.5–5.2)
BILIRUBIN DIRECT: 0.1 mg/dL (ref 0.0–0.3)
Total Bilirubin: 0.6 mg/dL (ref 0.2–1.2)
Total Protein: 6.9 g/dL (ref 6.0–8.3)

## 2014-11-26 MED ORDER — LISINOPRIL-HYDROCHLOROTHIAZIDE 20-12.5 MG PO TABS: 1.0000 | ORAL_TABLET | Freq: Two times a day (BID) | ORAL | Status: DC

## 2014-11-26 NOTE — Progress Notes (Signed)
   Subjective:    Patient ID: Crystal Peck, female    DOB: 09/10/1965, 49 y.o.   MRN: [id number]  HPI HTN- chronic problem, on Lisinopril-HCTZ, Amlodipine, Atenolol.  Amlodipine was started at last visit.  Pt is wearing heart monitor for 30 days (Dr Radford Pax) 'to make sure i wasn't afibbing during my sleep'.  Pt's BP was up to '180 something' during her CTA last week.  No CP, SOB, visual changes outside of known changes.  + edema of ankles bilaterally  ER F/U- pt had severe R flank pain and called 911 to go to ER on 7/28.  + vomiting.  sxs have resolved at this time.  Noted to have low K+ and mildly elevated AST in ER.  Was supposed to have recheck.  Hyperthyroid- ongoing problem for pt.  Was told that thyroid medication was too low, so recently increased methimazole to 20mg  daily.   Review of Systems For ROS see HPI     Objective:   Physical Exam  Constitutional: She is oriented to person, place, and time. She appears well-developed and well-nourished. No distress.  HENT:  Head: Normocephalic and atraumatic.  Eyes: Conjunctivae and EOM are normal. Pupils are equal, round, and reactive to light.  Neck: Normal range of motion. Neck supple. No thyromegaly present.  Cardiovascular: Normal rate, regular rhythm, normal heart sounds and intact distal pulses.   No murmur heard. Pulmonary/Chest: Effort normal and breath sounds normal. No respiratory distress.  Abdominal: Soft. She exhibits no distension. There is no tenderness.  Musculoskeletal: She exhibits edema (L LE edema, 1+, trace edema on R). She exhibits no tenderness.  Lymphadenopathy:    She has no cervical adenopathy.  Neurological: She is alert and oriented to person, place, and time.  Skin: Skin is warm and dry.  Psychiatric: She has a normal mood and affect. Her behavior is normal.  Vitals reviewed.         Assessment & Plan:

## 2014-11-26 NOTE — Patient Instructions (Signed)
Follow up in 3-4 weeks to recheck BP and labs We'll notify you of your lab results and make any changes if needed STOP the Amlodipine INCREASE the Lisinopril HCTZ to 20/12.5mg  twice daily (new prescription downstairs) Drink plenty of fluids Call with any questions or concerns Hang in there!!!

## 2014-11-26 NOTE — Progress Notes (Signed)
Pre visit review using our clinic review tool, if applicable. No additional management support is needed unless otherwise documented below in the visit note. 

## 2014-11-27 ENCOUNTER — Telehealth: Payer: Self-pay | Admitting: Family Medicine

## 2014-11-27 NOTE — Assessment & Plan Note (Signed)
Noted on labs done in ER.  Repeat.  If they remain elevated, pursue work up.  Pt expressed understanding and is in agreement w/ plan.

## 2014-11-27 NOTE — Telephone Encounter (Signed)
Pt was no show 11/23/14 2:30pm, i believe pt came in at 3:00pm and was rescheduled for 11/26/14, charge for no show?

## 2014-11-27 NOTE — Assessment & Plan Note (Signed)
Still not controlled despite addition of Amlodipine and pt now having ankle edema.  Based on this, will stop the Amlodipine and max out the Lisinopril HCTZ.  Again stressed need for healthy, low salt diet and regular exercise.  Will continue to follow closely and adjust meds prn.

## 2014-11-27 NOTE — Assessment & Plan Note (Signed)
Pt continues to follow w/ endo for dose adjustment.  This may be contributing to pt's HTN.  Will continue to follow along.

## 2014-11-27 NOTE — Telephone Encounter (Signed)
Yes- pt needs a no-show fee

## 2014-11-27 NOTE — Assessment & Plan Note (Signed)
Repeat labs.  Replete prn.

## 2014-12-10 ENCOUNTER — Ambulatory Visit: Payer: 59 | Admitting: Cardiology

## 2014-12-11 ENCOUNTER — Encounter (HOSPITAL_BASED_OUTPATIENT_CLINIC_OR_DEPARTMENT_OTHER): Payer: Self-pay

## 2014-12-11 ENCOUNTER — Ambulatory Visit: Payer: 59 | Admitting: Physician Assistant

## 2014-12-11 ENCOUNTER — Emergency Department (HOSPITAL_BASED_OUTPATIENT_CLINIC_OR_DEPARTMENT_OTHER)
Admission: EM | Admit: 2014-12-11 | Discharge: 2014-12-11 | Disposition: A | Discharge status: Home / Self Care | Payer: 59 | Attending: Emergency Medicine | Admitting: Emergency Medicine

## 2014-12-11 DIAGNOSIS — Z8669 Personal history of other diseases of the nervous system and sense organs: Secondary | ICD-10-CM | POA: Diagnosis not present

## 2014-12-11 DIAGNOSIS — J45909 Unspecified asthma, uncomplicated: Secondary | ICD-10-CM | POA: Insufficient documentation

## 2014-12-11 DIAGNOSIS — R112 Nausea with vomiting, unspecified: Secondary | ICD-10-CM | POA: Diagnosis present

## 2014-12-11 DIAGNOSIS — Z79899 Other long term (current) drug therapy: Secondary | ICD-10-CM | POA: Diagnosis not present

## 2014-12-11 DIAGNOSIS — K219 Gastro-esophageal reflux disease without esophagitis: Secondary | ICD-10-CM | POA: Insufficient documentation

## 2014-12-11 DIAGNOSIS — I10 Essential (primary) hypertension: Secondary | ICD-10-CM | POA: Insufficient documentation

## 2014-12-11 DIAGNOSIS — R1031 Right lower quadrant pain: Secondary | ICD-10-CM | POA: Diagnosis not present

## 2014-12-11 DIAGNOSIS — E785 Hyperlipidemia, unspecified: Secondary | ICD-10-CM | POA: Diagnosis not present

## 2014-12-11 DIAGNOSIS — R1011 Right upper quadrant pain: Secondary | ICD-10-CM | POA: Insufficient documentation

## 2014-12-11 DIAGNOSIS — E669 Obesity, unspecified: Secondary | ICD-10-CM | POA: Diagnosis not present

## 2014-12-11 DIAGNOSIS — E119 Type 2 diabetes mellitus without complications: Secondary | ICD-10-CM | POA: Insufficient documentation

## 2014-12-11 LAB — CBC WITH DIFFERENTIAL/PLATELET
BASOS PCT: 1 % (ref 0–1)
Basophils Absolute: 0 10*3/uL (ref 0.0–0.1)
Eosinophils Absolute: 0 10*3/uL (ref 0.0–0.7)
Eosinophils Relative: 0 % (ref 0–5)
HCT: 41.8 % (ref 36.0–46.0)
HEMOGLOBIN: 13.9 g/dL (ref 12.0–15.0)
Lymphocytes Relative: 24 % (ref 12–46)
Lymphs Abs: 1.1 10*3/uL (ref 0.7–4.0)
MCH: 28.7 pg (ref 26.0–34.0)
MCHC: 33.3 g/dL (ref 30.0–36.0)
MCV: 86.4 fL (ref 78.0–100.0)
Monocytes Absolute: 0.2 10*3/uL (ref 0.1–1.0)
Monocytes Relative: 5 % (ref 3–12)
Neutro Abs: 3.4 10*3/uL (ref 1.7–7.7)
Neutrophils Relative %: 70 % (ref 43–77)
Platelets: 288 10*3/uL (ref 150–400)
RBC: 4.84 MIL/uL (ref 3.87–5.11)
RDW: 12.2 % (ref 11.5–15.5)
WBC: 4.8 10*3/uL (ref 4.0–10.5)

## 2014-12-11 LAB — COMPREHENSIVE METABOLIC PANEL
ALK PHOS: 105 U/L (ref 38–126)
ALT: 31 U/L (ref 14–54)
ANION GAP: 12 (ref 5–15)
AST: 21 U/L (ref 15–41)
Albumin: 4.3 g/dL (ref 3.5–5.0)
BUN: 12 mg/dL (ref 6–20)
CALCIUM: 9.4 mg/dL (ref 8.9–10.3)
CO2: 27 mmol/L (ref 22–32)
Chloride: 100 mmol/L — ABNORMAL LOW (ref 101–111)
Creatinine, Ser: 0.53 mg/dL (ref 0.44–1.00)
GFR calc non Af Amer: >60 mL/min (ref >60)
Glucose, Bld: 218 mg/dL — ABNORMAL HIGH (ref 65–99)
Potassium: 3.5 mmol/L (ref 3.5–5.1)
Sodium: 139 mmol/L (ref 135–145)
Total Bilirubin: 1.1 mg/dL (ref 0.3–1.2)
Total Protein: 8.2 g/dL — ABNORMAL HIGH (ref 6.5–8.1)

## 2014-12-11 LAB — URINALYSIS, ROUTINE W REFLEX MICROSCOPIC
Bilirubin Urine: NEGATIVE
Glucose, UA: >1000 mg/dL — AB
Ketones, ur: 40 mg/dL — AB
Leukocytes, UA: NEGATIVE
Nitrite: NEGATIVE
PROTEIN: 100 mg/dL — AB
Specific Gravity, Urine: 1.031 — ABNORMAL HIGH (ref 1.005–1.030)
UROBILINOGEN UA: 1 mg/dL (ref 0.0–1.0)
pH: 7 (ref 5.0–8.0)

## 2014-12-11 LAB — LIPASE, BLOOD: LIPASE: 26 U/L (ref 22–51)

## 2014-12-11 LAB — URINE MICROSCOPIC-ADD ON

## 2014-12-11 MED ORDER — GI COCKTAIL ~~LOC~~
30.0000 mL | Freq: Once | ORAL | Status: AC
  Administered 2014-12-11: 30 mL via ORAL
  Filled 2014-12-11: qty 30

## 2014-12-11 MED ORDER — ONDANSETRON HCL 4 MG/2ML IJ SOLN
4.0000 mg | Freq: Once | INTRAMUSCULAR | Status: AC
  Administered 2014-12-11: 4 mg via INTRAVENOUS
  Filled 2014-12-11: qty 2

## 2014-12-11 MED ORDER — SODIUM CHLORIDE 0.9 % IV BOLUS (SEPSIS)
1000.0000 mL | Freq: Once | INTRAVENOUS | Status: AC
  Administered 2014-12-11: 1000 mL via INTRAVENOUS

## 2014-12-11 MED ORDER — HYDROMORPHONE HCL 1 MG/ML IJ SOLN
1.0000 mg | Freq: Once | INTRAMUSCULAR | Status: AC
  Administered 2014-12-11: 1 mg via INTRAVENOUS
  Filled 2014-12-11: qty 1

## 2014-12-11 MED ORDER — DIPHENHYDRAMINE HCL 50 MG/ML IJ SOLN
25.0000 mg | Freq: Once | INTRAMUSCULAR | Status: AC
  Administered 2014-12-11: 25 mg via INTRAVENOUS
  Filled 2014-12-11: qty 1

## 2014-12-11 MED ORDER — METOCLOPRAMIDE HCL 10 MG PO TABS: 10.0000 mg | ORAL_TABLET | Freq: Four times a day (QID) | ORAL | Status: DC | PRN

## 2014-12-11 MED ORDER — METOCLOPRAMIDE HCL 5 MG/ML IJ SOLN
10.0000 mg | Freq: Once | INTRAMUSCULAR | Status: AC
  Administered 2014-12-11: 10 mg via INTRAVENOUS
  Filled 2014-12-11: qty 2

## 2014-12-11 NOTE — ED Provider Notes (Signed)
CSN: [id number]     Arrival date & time 12/11/14  1341 History   First MD Initiated Contact with Patient 12/11/14 1352     Chief Complaint  Patient presents with  . Emesis     (Consider location/radiation/quality/duration/timing/severity/associated sxs/prior Treatment) Patient is a 49 y.o. female presenting with vomiting.  Emesis Severity:  Moderate Duration:  3 days Timing:  Constant Progression:  Worsening Chronicity:  Recurrent Relieved by:  Nothing Worsened by:  Nothing tried Ineffective treatments:  None tried Associated symptoms: abdominal pain   Associated symptoms: no chills, no diarrhea and no fever   Abdominal pain:    Location:  RUQ, R flank and RLQ   Severity:  Moderate   Onset quality:  Gradual   Duration:  3 days   Timing:  Intermittent   Progression:  Unchanged   Chronicity:  Recurrent   Past Medical History  Diagnosis Date  . Asthma   . Diabetes mellitus   . Hyperlipidemia   . Hypertension   . GERD (gastroesophageal reflux disease)   . Cerebellar tumor   . Obesity   . Sleep apnea   . A-fib   . Thyroid disease   . Chest pain 03/27/2014    negative lexiscan myoview   Past Surgical History  Procedure Laterality Date  . Cholecystectomy    . Partial hysterectomy      ovaries remain  . Nasal sinus surgery      Dr. Lyn Hollingshead  . Abdominal hysterectomy     Family History  Problem Relation Age of Onset  . Coronary artery disease Father   . Hypertension Father   . Diabetes      maternal grandparents  . Hyperlipidemia Other    Social History  Substance Use Topics  . Smoking status: Never Smoker   . Smokeless tobacco: Never Used  . Alcohol Use: No   OB History    No data available     Review of Systems  Constitutional: Negative for chills.  Gastrointestinal: Positive for vomiting and abdominal pain. Negative for diarrhea.  All other systems reviewed and are negative.     Allergies  Review of patient's allergies indicates no known  allergies.  Home Medications   Prior to Admission medications   Medication Sig Start Date End Date Taking? Authorizing Provider  albuterol (PROVENTIL HFA;VENTOLIN HFA) 108 (90 BASE) MCG/ACT inhaler Inhale 2 puffs into the lungs every 4 (four) hours as needed for wheezing or shortness of breath.    Historical Provider, MD  ALPRAZolam Duanne Moron) 0.5 MG tablet Take 1 tablet (0.5 mg total) by mouth 2 (two) times daily as needed for anxiety. 10/29/14   Midge Minium, MD  atenolol (TENORMIN) 100 MG tablet Take 1 tablet (100 mg total) by mouth every evening. 10/15/14   Midge Minium, MD  atenolol (TENORMIN) 25 MG tablet Take 1 tablet (25 mg total) by mouth daily as needed. 09/13/14   Sueanne Margarita, MD  atorvastatin (LIPITOR) 80 MG tablet Take 1 tablet (80 mg total) by mouth daily. 11/22/14   Sueanne Margarita, MD  azelastine (OPTIVAR) 0.05 % ophthalmic solution Place 1 drop into both eyes 2 (two) times daily. 10/15/14   Midge Minium, MD  beclomethasone (QVAR) 80 MCG/ACT inhaler Inhale 3 puffs into the lungs daily. 10/12/14   Colon Branch, MD  canagliflozin (INVOKANA) 300 MG TABS tablet Take 300 mg by mouth daily before breakfast.    Historical Provider, MD  cetirizine (ZYRTEC) 10 MG tablet Take 1  tablet (10 mg total) by mouth daily. 10/15/14   Midge Minium, MD  escitalopram (LEXAPRO) 10 MG tablet Take 1 tablet (10 mg total) by mouth daily. 10/15/14   Midge Minium, MD  glipiZIDE (GLUCOTROL XL) 10 MG 24 hr tablet Take 10 mg by mouth daily with breakfast.    Historical Provider, MD  glucose blood test strip 1 each by Other route as needed. Onetouch ultra test strip     Historical Provider, MD  IRON PO Take 2 tablets by mouth 3 (three) times a week.     Historical Provider, MD  LANTUS SOLOSTAR 100 UNIT/ML Solostar Pen Inject 32 Units into the skin every morning. AS DIRECTED 02/27/14   Historical Provider, MD  lisinopril-hydrochlorothiazide (PRINZIDE,ZESTORETIC) 20-12.5 MG per tablet Take 1  tablet by mouth 2 (two) times daily. 11/26/14   Midge Minium, MD  metFORMIN (GLUCOPHAGE) 1000 MG tablet Take 1,000 mg by mouth 2 (two) times daily with a meal.    Historical Provider, MD  methimazole (TAPAZOLE) 5 MG tablet Take 7.5 mg by mouth every evening.  02/27/14   Historical Provider, MD  metoCLOPramide (REGLAN) 10 MG tablet Take 1 tablet (10 mg total) by mouth every 6 (six) hours as needed for nausea (nausea/headache). 12/11/14   Orpah Greek, MD  Multiple Vitamin (MULTIVITAMIN WITH MINERALS) TABS tablet Take 1 tablet by mouth every evening.     Historical Provider, MD  Omega-3 Fatty Acids (FISH OIL) 1000 MG CAPS Take 2,000 mg by mouth every evening.     Historical Provider, MD  ondansetron (ZOFRAN ODT) 4 MG disintegrating tablet Take 1 tablet (4 mg total) by mouth every 8 (eight) hours as needed for nausea or vomiting. 06/19/14   Pleas Koch, NP  ondansetron (ZOFRAN) 4 MG tablet Take 1 tablet (4 mg total) by mouth every 6 (six) hours as needed for nausea or vomiting. 2/63/78   Delora Fuel, MD  oxyCODONE-acetaminophen (PERCOCET) 5-325 MG per tablet Take 1 tablet by mouth every 4 (four) hours as needed for moderate pain. 5/88/50   Delora Fuel, MD  pantoprazole (PROTONIX) 40 MG tablet Take 1 tablet (40 mg total) by mouth daily. 06/07/14   Midge Minium, MD  potassium chloride SA (K-DUR,KLOR-CON) 20 MEQ tablet Take 20 mEq by mouth daily as needed (for leg cramping). Take one tablet by mouth daily. 02/26/12   Midge Minium, MD  rivaroxaban (XARELTO) 20 MG TABS tablet Take 1 tablet (20 mg total) by mouth daily with supper. 07/02/14   Midge Minium, MD  Vitamin D, Ergocalciferol, (DRISDOL) 50000 UNITS CAPS capsule Take 50,000 Units by mouth every 7 (seven) days.    Historical Provider, MD   BP 162/68 mmHg  Pulse 79  Temp(Src) 98.5 F (36.9 C) (Oral)  Resp 17  Ht 5' (1.524 m)  Wt 180 lb (81.647 kg)  BMI 35.15 kg/m2  SpO2 98% Physical Exam  Constitutional: She is  oriented to person, place, and time. She appears well-developed and well-nourished.  HENT:  Head: Normocephalic and atraumatic.  Right Ear: External ear normal.  Left Ear: External ear normal.  Eyes: Conjunctivae and EOM are normal. Pupils are equal, round, and reactive to light.  Neck: Normal range of motion. Neck supple.  Cardiovascular: Normal rate, regular rhythm, normal heart sounds and intact distal pulses.   Pulmonary/Chest: Effort normal and breath sounds normal.  Abdominal: Soft. Bowel sounds are normal. There is tenderness in the right upper quadrant and right lower quadrant.  Musculoskeletal:  Normal range of motion.  Neurological: She is alert and oriented to person, place, and time.  Skin: Skin is warm and dry.  Vitals reviewed.   ED Course  Procedures (including critical care time) Labs Review Labs Reviewed  COMPREHENSIVE METABOLIC PANEL - Abnormal; Notable for the following:    Chloride 100 (*)    Glucose, Bld 218 (*)    Total Protein 8.2 (*)    All other components within normal limits  URINALYSIS, ROUTINE W REFLEX MICROSCOPIC (NOT AT [facility]) - Abnormal; Notable for the following:    Specific Gravity, Urine 1.031 (*)    Glucose, UA >1000 (*)    Hgb urine dipstick SMALL (*)    Ketones, ur 40 (*)    Protein, ur 100 (*)    All other components within normal limits  LIPASE, BLOOD  CBC WITH DIFFERENTIAL/PLATELET  URINE MICROSCOPIC-ADD ON    Imaging Review No results found. I have personally reviewed and evaluated these images and lab results as part of my medical decision-making.   EKG Interpretation   Date/Time:  Tuesday December 11 2014 14:22:06 EDT Ventricular Rate:  77 PR Interval:  134 QRS Duration: 92 QT Interval:  386 QTC Calculation: 436 R Axis:   16 Text Interpretation:  Normal sinus rhythm with sinus arrhythmia Minimal  voltage criteria for LVH, may be normal variant Cannot rule out Anterior  infarct , age undetermined Abnormal ECG No significant  change since last  tracing Confirmed by Debby Freiberg [phone]) on 12/11/2014 2:57:49 PM      MDM   Final diagnoses:  Nausea and vomiting, vomiting of unspecified type    49 y.o. female with pertinent PMH of chronic abd pain, DM, GERD presents with recurrent abd pain and nausea with vomiting x 3 days.  No change in BM.  Pt seen last month for same, had negative CT scan, and has had one 3 months ago which was also negative.  She has had both GI and cardiology fu for identical symptoms in the past with unremarkable wu.  On arrival today vitals and physical exam as above.  Although the patient does have focal right upper quadrant belly tenderness she is status post cholecystectomy and we specifically discussed being frugal with CT scan considering her prior history of negative workup.  Workup today as above unremarkable. Unfortunately, the patient developed recurrent nausea and vomiting and has been by mouth intolerant since 3 AM.  We'll plan to give Reglan and Benadryl and rechallenge per patient's wishes.  I have reviewed all laboratory and imaging studies if ordered as above  1. Nausea and vomiting, vomiting of unspecified type         Debby Freiberg, MD 12/12/14 [phone]

## 2014-12-11 NOTE — ED Notes (Signed)
abd pain, n/v since Friday

## 2014-12-11 NOTE — Discharge Instructions (Signed)

## 2014-12-11 NOTE — ED Provider Notes (Signed)
Patient signed out to me to follow-up on improvement after treatment for recurrent nausea and vomiting. Patient administered analgesia, Zofran, Reglan, Benadryl. Repeat examination reveals that she is feeling much better. She is appropriate for discharge. Workup was unremarkable. Will prescribe Reglan for use at home, follow-up with her GI doctor.  Orpah Greek, MD 12/11/14 [phone]

## 2014-12-12 ENCOUNTER — Emergency Department (HOSPITAL_BASED_OUTPATIENT_CLINIC_OR_DEPARTMENT_OTHER): Payer: 59

## 2014-12-12 ENCOUNTER — Emergency Department (HOSPITAL_BASED_OUTPATIENT_CLINIC_OR_DEPARTMENT_OTHER)
Admission: EM | Admit: 2014-12-12 | Discharge: 2014-12-12 | Disposition: A | Discharge status: Home / Self Care | Payer: 59 | Attending: Emergency Medicine | Admitting: Emergency Medicine

## 2014-12-12 ENCOUNTER — Encounter (HOSPITAL_BASED_OUTPATIENT_CLINIC_OR_DEPARTMENT_OTHER): Payer: Self-pay | Admitting: *Deleted

## 2014-12-12 DIAGNOSIS — E119 Type 2 diabetes mellitus without complications: Secondary | ICD-10-CM | POA: Insufficient documentation

## 2014-12-12 DIAGNOSIS — R112 Nausea with vomiting, unspecified: Secondary | ICD-10-CM | POA: Insufficient documentation

## 2014-12-12 DIAGNOSIS — I10 Essential (primary) hypertension: Secondary | ICD-10-CM | POA: Insufficient documentation

## 2014-12-12 DIAGNOSIS — I4891 Unspecified atrial fibrillation: Secondary | ICD-10-CM | POA: Insufficient documentation

## 2014-12-12 DIAGNOSIS — R109 Unspecified abdominal pain: Secondary | ICD-10-CM

## 2014-12-12 DIAGNOSIS — E785 Hyperlipidemia, unspecified: Secondary | ICD-10-CM | POA: Diagnosis not present

## 2014-12-12 DIAGNOSIS — J45909 Unspecified asthma, uncomplicated: Secondary | ICD-10-CM | POA: Diagnosis not present

## 2014-12-12 DIAGNOSIS — K5900 Constipation, unspecified: Secondary | ICD-10-CM | POA: Insufficient documentation

## 2014-12-12 DIAGNOSIS — K219 Gastro-esophageal reflux disease without esophagitis: Secondary | ICD-10-CM | POA: Insufficient documentation

## 2014-12-12 DIAGNOSIS — R197 Diarrhea, unspecified: Secondary | ICD-10-CM | POA: Insufficient documentation

## 2014-12-12 DIAGNOSIS — E669 Obesity, unspecified: Secondary | ICD-10-CM | POA: Insufficient documentation

## 2014-12-12 DIAGNOSIS — R1010 Upper abdominal pain, unspecified: Secondary | ICD-10-CM | POA: Diagnosis present

## 2014-12-12 DIAGNOSIS — R1013 Epigastric pain: Secondary | ICD-10-CM | POA: Insufficient documentation

## 2014-12-12 DIAGNOSIS — R52 Pain, unspecified: Secondary | ICD-10-CM

## 2014-12-12 DIAGNOSIS — Z8669 Personal history of other diseases of the nervous system and sense organs: Secondary | ICD-10-CM | POA: Insufficient documentation

## 2014-12-12 LAB — URINALYSIS, ROUTINE W REFLEX MICROSCOPIC
BILIRUBIN URINE: NEGATIVE
Glucose, UA: >1000 mg/dL — AB
Hgb urine dipstick: NEGATIVE
Ketones, ur: 40 mg/dL — AB
LEUKOCYTES UA: NEGATIVE
NITRITE: NEGATIVE
PH: 7.5 (ref 5.0–8.0)
Protein, ur: NEGATIVE mg/dL
SPECIFIC GRAVITY, URINE: 1.026 (ref 1.005–1.030)
Urobilinogen, UA: 0.2 mg/dL (ref 0.0–1.0)

## 2014-12-12 LAB — CBC WITH DIFFERENTIAL/PLATELET
BASOS ABS: 0 10*3/uL (ref 0.0–0.1)
BASOS PCT: 1 % (ref 0–1)
Eosinophils Absolute: 0 10*3/uL (ref 0.0–0.7)
Eosinophils Relative: 1 % (ref 0–5)
HEMATOCRIT: 39.5 % (ref 36.0–46.0)
HEMOGLOBIN: 13.2 g/dL (ref 12.0–15.0)
Lymphocytes Relative: 32 % (ref 12–46)
Lymphs Abs: 1.8 10*3/uL (ref 0.7–4.0)
MCH: 28.6 pg (ref 26.0–34.0)
MCHC: 33.4 g/dL (ref 30.0–36.0)
MCV: 85.7 fL (ref 78.0–100.0)
Monocytes Absolute: 0.5 10*3/uL (ref 0.1–1.0)
Monocytes Relative: 8 % (ref 3–12)
NEUTROS ABS: 3.3 10*3/uL (ref 1.7–7.7)
NEUTROS PCT: 58 % (ref 43–77)
Platelets: 317 10*3/uL (ref 150–400)
RBC: 4.61 MIL/uL (ref 3.87–5.11)
RDW: 12.3 % (ref 11.5–15.5)
WBC: 5.7 10*3/uL (ref 4.0–10.5)

## 2014-12-12 LAB — COMPREHENSIVE METABOLIC PANEL
ALBUMIN: 4.3 g/dL (ref 3.5–5.0)
ALK PHOS: 100 U/L (ref 38–126)
ALT: 29 U/L (ref 14–54)
AST: 22 U/L (ref 15–41)
Anion gap: 15 (ref 5–15)
BILIRUBIN TOTAL: 1.3 mg/dL — AB (ref 0.3–1.2)
BUN: 11 mg/dL (ref 6–20)
CO2: 25 mmol/L (ref 22–32)
Calcium: 9.7 mg/dL (ref 8.9–10.3)
Chloride: 99 mmol/L — ABNORMAL LOW (ref 101–111)
Creatinine, Ser: 0.69 mg/dL (ref 0.44–1.00)
GFR calc Af Amer: >60 mL/min (ref >60)
GFR calc non Af Amer: >60 mL/min (ref >60)
GLUCOSE: 243 mg/dL — AB (ref 65–99)
POTASSIUM: 3.5 mmol/L (ref 3.5–5.1)
SODIUM: 139 mmol/L (ref 135–145)
TOTAL PROTEIN: 8.1 g/dL (ref 6.5–8.1)

## 2014-12-12 LAB — URINE MICROSCOPIC-ADD ON

## 2014-12-12 LAB — LIPASE, BLOOD: Lipase: 43 U/L (ref 22–51)

## 2014-12-12 MED ORDER — ONDANSETRON HCL 4 MG/2ML IJ SOLN
INTRAMUSCULAR | Status: AC
  Administered 2014-12-12: 4 mg
  Filled 2014-12-12: qty 2

## 2014-12-12 MED ORDER — PROMETHAZINE HCL 25 MG PO TABS: 25.0000 mg | ORAL_TABLET | Freq: Four times a day (QID) | ORAL | Status: DC | PRN

## 2014-12-12 MED ORDER — HYDROMORPHONE HCL 1 MG/ML IJ SOLN
INTRAMUSCULAR | Status: AC
  Administered 2014-12-12: 1 mg
  Filled 2014-12-12: qty 1

## 2014-12-12 MED ORDER — DICYCLOMINE HCL 20 MG PO TABS: 20.0000 mg | ORAL_TABLET | Freq: Two times a day (BID) | ORAL | Status: DC

## 2014-12-12 NOTE — ED Notes (Signed)
Pt presents with abd pain, nausea and active vomiting

## 2014-12-12 NOTE — ED Notes (Signed)
DELAY IN CHARTING DUE TO UNSCHEDULED DOWNTIME. REFER TO DOWNTIME SHEET

## 2014-12-12 NOTE — Discharge Instructions (Signed)

## 2014-12-12 NOTE — ED Notes (Signed)
States abd pain has been occurring for the past several days, was in this ED yesterday with same complaint

## 2014-12-13 ENCOUNTER — Emergency Department (HOSPITAL_BASED_OUTPATIENT_CLINIC_OR_DEPARTMENT_OTHER): Payer: 59

## 2014-12-13 ENCOUNTER — Encounter (HOSPITAL_BASED_OUTPATIENT_CLINIC_OR_DEPARTMENT_OTHER): Payer: Self-pay | Admitting: Emergency Medicine

## 2014-12-13 ENCOUNTER — Emergency Department (HOSPITAL_BASED_OUTPATIENT_CLINIC_OR_DEPARTMENT_OTHER)
Admission: EM | Admit: 2014-12-13 | Discharge: 2014-12-13 | Disposition: A | Discharge status: Home / Self Care | Payer: 59 | Attending: Emergency Medicine | Admitting: Emergency Medicine

## 2014-12-13 DIAGNOSIS — Z85841 Personal history of malignant neoplasm of brain: Secondary | ICD-10-CM | POA: Diagnosis not present

## 2014-12-13 DIAGNOSIS — I10 Essential (primary) hypertension: Secondary | ICD-10-CM | POA: Insufficient documentation

## 2014-12-13 DIAGNOSIS — R112 Nausea with vomiting, unspecified: Secondary | ICD-10-CM | POA: Diagnosis not present

## 2014-12-13 DIAGNOSIS — E785 Hyperlipidemia, unspecified: Secondary | ICD-10-CM | POA: Insufficient documentation

## 2014-12-13 DIAGNOSIS — Z8669 Personal history of other diseases of the nervous system and sense organs: Secondary | ICD-10-CM | POA: Insufficient documentation

## 2014-12-13 DIAGNOSIS — Z9049 Acquired absence of other specified parts of digestive tract: Secondary | ICD-10-CM | POA: Insufficient documentation

## 2014-12-13 DIAGNOSIS — Z79899 Other long term (current) drug therapy: Secondary | ICD-10-CM | POA: Insufficient documentation

## 2014-12-13 DIAGNOSIS — Z90710 Acquired absence of both cervix and uterus: Secondary | ICD-10-CM | POA: Diagnosis not present

## 2014-12-13 DIAGNOSIS — K5900 Constipation, unspecified: Secondary | ICD-10-CM | POA: Insufficient documentation

## 2014-12-13 DIAGNOSIS — R1013 Epigastric pain: Secondary | ICD-10-CM | POA: Diagnosis not present

## 2014-12-13 DIAGNOSIS — I4891 Unspecified atrial fibrillation: Secondary | ICD-10-CM | POA: Insufficient documentation

## 2014-12-13 DIAGNOSIS — E119 Type 2 diabetes mellitus without complications: Secondary | ICD-10-CM | POA: Diagnosis not present

## 2014-12-13 DIAGNOSIS — R1010 Upper abdominal pain, unspecified: Secondary | ICD-10-CM

## 2014-12-13 DIAGNOSIS — Z7951 Long term (current) use of inhaled steroids: Secondary | ICD-10-CM | POA: Insufficient documentation

## 2014-12-13 DIAGNOSIS — E669 Obesity, unspecified: Secondary | ICD-10-CM | POA: Insufficient documentation

## 2014-12-13 DIAGNOSIS — K219 Gastro-esophageal reflux disease without esophagitis: Secondary | ICD-10-CM | POA: Diagnosis not present

## 2014-12-13 DIAGNOSIS — J45909 Unspecified asthma, uncomplicated: Secondary | ICD-10-CM | POA: Diagnosis not present

## 2014-12-13 DIAGNOSIS — R1011 Right upper quadrant pain: Secondary | ICD-10-CM | POA: Diagnosis not present

## 2014-12-13 LAB — URINALYSIS, ROUTINE W REFLEX MICROSCOPIC
Bilirubin Urine: NEGATIVE
Glucose, UA: >1000 mg/dL — AB
LEUKOCYTES UA: NEGATIVE
Nitrite: NEGATIVE
PROTEIN: 100 mg/dL — AB
Specific Gravity, Urine: 1.031 — ABNORMAL HIGH (ref 1.005–1.030)
UROBILINOGEN UA: 0.2 mg/dL (ref 0.0–1.0)
pH: 6.5 (ref 5.0–8.0)

## 2014-12-13 LAB — URINE MICROSCOPIC-ADD ON

## 2014-12-13 LAB — CBG MONITORING, ED: Glucose-Capillary: 209 mg/dL — ABNORMAL HIGH (ref 65–99)

## 2014-12-13 MED ORDER — HALOPERIDOL LACTATE 5 MG/ML IJ SOLN
2.0000 mg | Freq: Once | INTRAMUSCULAR | Status: AC
  Administered 2014-12-13: 2 mg via INTRAVENOUS
  Filled 2014-12-13: qty 1

## 2014-12-13 MED ORDER — ACETAMINOPHEN 500 MG PO TABS
1000.0000 mg | ORAL_TABLET | Freq: Once | ORAL | Status: AC
  Administered 2014-12-13: 1000 mg via ORAL
  Filled 2014-12-13: qty 2

## 2014-12-13 MED ORDER — SODIUM CHLORIDE 0.9 % IV BOLUS (SEPSIS)
1000.0000 mL | Freq: Once | INTRAVENOUS | Status: AC
Start: 2014-12-13 — End: 2014-12-13
  Administered 2014-12-13: 1000 mL via INTRAVENOUS

## 2014-12-13 MED ORDER — FLEET ENEMA 7-19 GM/118ML RE ENEM
1.0000 | ENEMA | Freq: Once | RECTAL | Status: AC
  Administered 2014-12-13: 1 via RECTAL
  Filled 2014-12-13: qty 1

## 2014-12-13 MED ORDER — POLYETHYLENE GLYCOL 3350 17 GM/SCOOP PO POWD: ORAL | Status: DC

## 2014-12-13 MED ORDER — PROMETHAZINE HCL 25 MG RE SUPP: 25.0000 mg | Freq: Four times a day (QID) | RECTAL | Status: DC | PRN

## 2014-12-13 MED ORDER — IOHEXOL 300 MG/ML  SOLN
25.0000 mL | Freq: Once | INTRAMUSCULAR | Status: AC | PRN
  Administered 2014-12-13: 25 mL via ORAL

## 2014-12-13 MED ORDER — SUCRALFATE 1 G PO TABS
1.0000 g | ORAL_TABLET | Freq: Once | ORAL | Status: AC
  Administered 2014-12-13: 1 g via ORAL
  Filled 2014-12-13: qty 1

## 2014-12-13 MED ORDER — IOHEXOL 300 MG/ML  SOLN
100.0000 mL | Freq: Once | INTRAMUSCULAR | Status: AC | PRN
  Administered 2014-12-13: 100 mL via INTRAVENOUS

## 2014-12-13 MED ORDER — DIPHENHYDRAMINE HCL 50 MG/ML IJ SOLN
25.0000 mg | Freq: Once | INTRAMUSCULAR | Status: AC
  Administered 2014-12-13: 25 mg via INTRAVENOUS
  Filled 2014-12-13: qty 1

## 2014-12-13 MED ORDER — FLEET ENEMA 7-19 GM/118ML RE ENEM: 1.0000 | ENEMA | Freq: Every day | RECTAL | Status: DC | PRN

## 2014-12-13 MED ORDER — PANTOPRAZOLE SODIUM 40 MG PO TBEC
40.0000 mg | DELAYED_RELEASE_TABLET | Freq: Once | ORAL | Status: AC
  Administered 2014-12-13: 40 mg via ORAL
  Filled 2014-12-13: qty 1

## 2014-12-13 MED ORDER — PANTOPRAZOLE SODIUM 40 MG PO TBEC: 40.0000 mg | DELAYED_RELEASE_TABLET | Freq: Every day | ORAL | Status: DC

## 2014-12-13 MED ORDER — PROMETHAZINE HCL 25 MG/ML IJ SOLN: 12.5000 mg | Freq: Once | INTRAMUSCULAR | Status: DC

## 2014-12-13 MED ORDER — METOCLOPRAMIDE HCL 5 MG/ML IJ SOLN
10.0000 mg | Freq: Once | INTRAMUSCULAR | Status: AC
  Administered 2014-12-13: 10 mg via INTRAVENOUS
  Filled 2014-12-13: qty 2

## 2014-12-13 MED ORDER — ONDANSETRON HCL 4 MG/2ML IJ SOLN: 4.0000 mg | Freq: Once | INTRAMUSCULAR | Status: DC

## 2014-12-13 MED ORDER — SODIUM CHLORIDE 0.9 % IV BOLUS (SEPSIS)
1000.0000 mL | Freq: Once | INTRAVENOUS | Status: AC
  Administered 2014-12-13: 1000 mL via INTRAVENOUS

## 2014-12-13 NOTE — ED Notes (Signed)
Pt stated that she tried to take a phenegran prescribed yesterday but unable to keep it down, states she feels like she needs to have a bm , last normal bm 8/22

## 2014-12-13 NOTE — Discharge Instructions (Signed)
You were seen today for abdominal pain and vomiting. You were seen less than 12 hours prior with normal lab work. A repeat urinalysis shows some dehydration. You were given fluids, nausea medication. You were concerned regarding constipation. At this time and do not feel that narcotic pain medication will be helpful for you. Repeat CT scan shows no etiology for your abdominal pain. He will be discharged home with Phenergan suppositories and MiraLAX/enemas for constipation. You should follow-up with her primary physician and Dr. Collene Mares given ongoing symptoms. See return precautions below.  Abdominal Pain Many things can cause abdominal pain. Usually, abdominal pain is not caused by a disease and will improve without treatment. It can often be observed and treated at home. Your health care provider will do a physical exam and possibly order blood tests and X-rays to help determine the seriousness of your pain. However, in many cases, more time must pass before a clear cause of the pain can be found. Before that point, your health care provider may not know if you need more testing or further treatment. HOME CARE INSTRUCTIONS  Monitor your abdominal pain for any changes. The following actions may help to alleviate any discomfort you are experiencing:  Only take over-the-counter or prescription medicines as directed by your health care provider.  Do not take laxatives unless directed to do so by your health care provider.  Try a clear liquid diet (broth, tea, or water) as directed by your health care provider. Slowly move to a bland diet as tolerated. SEEK MEDICAL CARE IF:  You have unexplained abdominal pain.  You have abdominal pain associated with nausea or diarrhea.  You have pain when you urinate or have a bowel movement.  You experience abdominal pain that wakes you in the night.  You have abdominal pain that is worsened or improved by eating food.  You have abdominal pain that is worsened  with eating fatty foods.  You have a fever. SEEK IMMEDIATE MEDICAL CARE IF:   Your pain does not go away within 2 hours.  You keep throwing up (vomiting).  Your pain is felt only in portions of the abdomen, such as the right side or the left lower portion of the abdomen.  You pass bloody or black tarry stools. MAKE SURE YOU:  Understand these instructions.   Will watch your condition.   Will get help right away if you are not doing well or get worse.  Document Released: 01/14/2005 Document Revised: 04/11/2013 Document Reviewed: 12/14/2012 [facility] Patient Information 2015 [place], [place]. This information is not intended to replace advice given to you by your health care provider. Make sure you discuss any questions you have with your health care provider.

## 2014-12-13 NOTE — ED Provider Notes (Addendum)
CSN: [id number]     Arrival date & time 12/13/14  0113 History   First MD Initiated Contact with Patient 12/13/14 [phone]     Chief Complaint  Patient presents with  . Abdominal Pain     (Consider location/radiation/quality/duration/timing/severity/associated sxs/prior Treatment) HPI  This is a 49 year old female with a history of asthma, diabetes, hypertension, hyperlipidemia who presents with nausea, vomiting, and abdominal pain. This is the patient's third visit in less than 48 hours. Patient reports that she is continuing to "vomited up everything." She reports epigastric and right upper quadrant abdominal pain that radiates to her back. She denies any diarrhea. She's had nonbloody, nonbilious emesis. Last normal bowel movement was on 8/22. She thinks this may be contributing to her symptoms. She's previously been seen and had a full lab evaluation and KUB that were reassuring. She has had 2 CT scans since May including one at the end of July for similar symptoms that were reassuring. She reports that she is also been seen and had a colonoscopy by Dr. Collene Mares in June. Currently she rates her pain at 10 out of 10. She is unable to keep any medications down at home.  I am unable to review the patient's chart from earlier today as it was during Epic downtime. Lab work was reviewed and showed reassuring lab work. Glucose in the mid 200s no evidence of anion gap. Patient did have some evidence of dehydration. At that time was given pain and nausea medication. I do not see the fluids were ordered.   Past Medical History  Diagnosis Date  . Asthma   . Diabetes mellitus   . Hyperlipidemia   . Hypertension   . GERD (gastroesophageal reflux disease)   . Cerebellar tumor   . Obesity   . Sleep apnea   . A-fib   . Thyroid disease   . Chest pain 03/27/2014    negative lexiscan myoview   Past Surgical History  Procedure Laterality Date  . Cholecystectomy    . Partial hysterectomy      ovaries remain   . Nasal sinus surgery      Dr. Lyn Hollingshead  . Abdominal hysterectomy     Family History  Problem Relation Age of Onset  . Coronary artery disease Father   . Hypertension Father   . Diabetes      maternal grandparents  . Hyperlipidemia Other    Social History  Substance Use Topics  . Smoking status: Never Smoker   . Smokeless tobacco: Never Used  . Alcohol Use: No   OB History    No data available     Review of Systems  Constitutional: Negative for fever.  Respiratory: Negative for cough, chest tightness and shortness of breath.   Cardiovascular: Negative for chest pain.  Gastrointestinal: Positive for nausea, vomiting, abdominal pain and constipation.  Genitourinary: Negative for dysuria.  Musculoskeletal: Negative for back pain.  Neurological: Negative for headaches.  All other systems reviewed and are negative.      Allergies  Review of patient's allergies indicates no known allergies.  Home Medications   Prior to Admission medications   Medication Sig Start Date End Date Taking? Authorizing Provider  ALPRAZolam Duanne Moron) 0.5 MG tablet Take 1 tablet (0.5 mg total) by mouth 2 (two) times daily as needed for anxiety. 10/29/14  Yes Midge Minium, MD  atenolol (TENORMIN) 100 MG tablet Take 1 tablet (100 mg total) by mouth every evening. 10/15/14  Yes Midge Minium, MD  atenolol (TENORMIN)  25 MG tablet Take 1 tablet (25 mg total) by mouth daily as needed. 09/13/14  Yes Sueanne Margarita, MD  atorvastatin (LIPITOR) 80 MG tablet Take 1 tablet (80 mg total) by mouth daily. 11/22/14  Yes Sueanne Margarita, MD  azelastine (OPTIVAR) 0.05 % ophthalmic solution Place 1 drop into both eyes 2 (two) times daily. 10/15/14  Yes Midge Minium, MD  beclomethasone (QVAR) 80 MCG/ACT inhaler Inhale 3 puffs into the lungs daily. 10/12/14  Yes Colon Branch, MD  cetirizine (ZYRTEC) 10 MG tablet Take 1 tablet (10 mg total) by mouth daily. 10/15/14  Yes Midge Minium, MD  dicyclomine  (BENTYL) 20 MG tablet Take 1 tablet (20 mg total) by mouth 2 (two) times daily. 12/12/14  Yes Dorie Rank, MD  escitalopram (LEXAPRO) 10 MG tablet Take 1 tablet (10 mg total) by mouth daily. 10/15/14  Yes Midge Minium, MD  lisinopril-hydrochlorothiazide (PRINZIDE,ZESTORETIC) 20-12.5 MG per tablet Take 1 tablet by mouth 2 (two) times daily. 11/26/14  Yes Midge Minium, MD  metoCLOPramide (REGLAN) 10 MG tablet Take 1 tablet (10 mg total) by mouth every 6 (six) hours as needed for nausea (nausea/headache). 12/11/14  Yes Orpah Greek, MD  albuterol (PROVENTIL HFA;VENTOLIN HFA) 108 (90 BASE) MCG/ACT inhaler Inhale 2 puffs into the lungs every 4 (four) hours as needed for wheezing or shortness of breath.    Historical Provider, MD  canagliflozin (INVOKANA) 300 MG TABS tablet Take 300 mg by mouth daily before breakfast.    Historical Provider, MD  glipiZIDE (GLUCOTROL XL) 10 MG 24 hr tablet Take 10 mg by mouth daily with breakfast.    Historical Provider, MD  glucose blood test strip 1 each by Other route as needed. Onetouch ultra test strip     Historical Provider, MD  IRON PO Take 2 tablets by mouth 3 (three) times a week.     Historical Provider, MD  LANTUS SOLOSTAR 100 UNIT/ML Solostar Pen Inject 32 Units into the skin every morning. AS DIRECTED 02/27/14   Historical Provider, MD  metFORMIN (GLUCOPHAGE) 1000 MG tablet Take 1,000 mg by mouth 2 (two) times daily with a meal.    Historical Provider, MD  methimazole (TAPAZOLE) 5 MG tablet Take 7.5 mg by mouth every evening.  02/27/14   Historical Provider, MD  Multiple Vitamin (MULTIVITAMIN WITH MINERALS) TABS tablet Take 1 tablet by mouth every evening.     Historical Provider, MD  Omega-3 Fatty Acids (FISH OIL) 1000 MG CAPS Take 2,000 mg by mouth every evening.     Historical Provider, MD  ondansetron (ZOFRAN ODT) 4 MG disintegrating tablet Take 1 tablet (4 mg total) by mouth every 8 (eight) hours as needed for nausea or vomiting. 06/19/14    Pleas Koch, NP  ondansetron (ZOFRAN) 4 MG tablet Take 1 tablet (4 mg total) by mouth every 6 (six) hours as needed for nausea or vomiting. 11/29/55   Delora Fuel, MD  oxyCODONE-acetaminophen (PERCOCET) 5-325 MG per tablet Take 1 tablet by mouth every 4 (four) hours as needed for moderate pain. 2/62/03   Delora Fuel, MD  pantoprazole (PROTONIX) 40 MG tablet Take 1 tablet (40 mg total) by mouth daily. 06/07/14   Midge Minium, MD  pantoprazole (PROTONIX) 40 MG tablet Take 1 tablet (40 mg total) by mouth daily. 12/13/14   Merryl Hacker, MD  polyethylene glycol powder (MIRALAX) powder Take one capful by mouth twice daily until bowels are soft. 12/13/14   Merryl Hacker,  MD  potassium chloride SA (K-DUR,KLOR-CON) 20 MEQ tablet Take 20 mEq by mouth daily as needed (for leg cramping). Take one tablet by mouth daily. 02/26/12   Midge Minium, MD  promethazine (PHENERGAN) 25 MG suppository Place 1 suppository (25 mg total) rectally every 6 (six) hours as needed for nausea or vomiting. 12/13/14   Merryl Hacker, MD  rivaroxaban (XARELTO) 20 MG TABS tablet Take 1 tablet (20 mg total) by mouth daily with supper. 07/02/14   Midge Minium, MD  sodium phosphate (FLEET) 7-19 GM/118ML ENEM Place 133 mLs (1 enema total) rectally daily as needed for severe constipation. 12/13/14   Merryl Hacker, MD  Vitamin D, Ergocalciferol, (DRISDOL) 50000 UNITS CAPS capsule Take 50,000 Units by mouth every 7 (seven) days.    Historical Provider, MD   BP 177/75 mmHg  Pulse 99  Temp(Src) 98.3 F (36.8 C) (Oral)  Resp 18  SpO2 100% Physical Exam  Constitutional: She is oriented to person, place, and time.  Patient writhing around on the bed and will not stay still, obese  HENT:  Head: Normocephalic and atraumatic.  Mouth/Throat: Oropharynx is clear and moist.  Eyes: Pupils are equal, round, and reactive to light.  Cardiovascular: Normal rate, regular rhythm and normal heart sounds.   No murmur  heard. Pulmonary/Chest: Effort normal and breath sounds normal. No respiratory distress. She has no wheezes.  Abdominal: Soft. Bowel sounds are normal. There is tenderness. There is no rebound and no guarding.  Epigastric and right upper quadrant tenderness palpation without rebound and guarding  Musculoskeletal: She exhibits no edema.  Neurological: She is alert and oriented to person, place, and time.  Skin: Skin is warm and dry.  Psychiatric: She has a normal mood and affect.  Nursing note and vitals reviewed.   ED Course  Procedures (including critical care time) Labs Review Labs Reviewed  URINALYSIS, ROUTINE W REFLEX MICROSCOPIC (NOT AT [facility]) - Abnormal; Notable for the following:    Specific Gravity, Urine 1.031 (*)    Glucose, UA >1000 (*)    Hgb urine dipstick SMALL (*)    Ketones, ur >80 (*)    Protein, ur 100 (*)    All other components within normal limits  URINE MICROSCOPIC-ADD ON - Abnormal; Notable for the following:    Squamous Epithelial / LPF FEW (*)    All other components within normal limits    Imaging Review Ct Abdomen Pelvis W Contrast  12/13/2014   CLINICAL DATA:  Abdominal pain with nausea and vomiting.  EXAM: CT ABDOMEN AND PELVIS WITH CONTRAST  TECHNIQUE: Multidetector CT imaging of the abdomen and pelvis was performed using the standard protocol following bolus administration of intravenous contrast.  CONTRAST:  70mL OMNIPAQUE IOHEXOL 300 MG/ML SOLN, 136mL OMNIPAQUE IOHEXOL 300 MG/ML SOLN  COMPARISON:  CT 11/15/2014.  Radiographs 1 day prior.  FINDINGS: Lower chest:  The included lung bases are clear.  Liver: Normal in size without focal lesion. Prominence of the left hepatic lobe.  Hepatobiliary: Postcholecystectomy with clips in the gallbladder fossa. No biliary dilatation.  Pancreas: Normal.  Spleen: Normal.  Adrenal glands: No nodule.  Kidneys: Symmetric renal enhancement. No hydronephrosis. No localizing renal abnormality.  Stomach/Bowel: Stomach  physiologically distended. The duodenum does not cross the midline, majority of small bowel loops are in the right abdomen. Colon is in the left abdomen, with cecum in the midline. There are no dilated or thickened small bowel loops. Small volume of stool throughout the colon without colonic  wall thickening. The appendix is normal.  Vascular/Lymphatic: Abdominal aorta is normal in caliber. Mild atherosclerosis. Small retroperitoneal lymph nodes, largest measuring 7 mm in short axis dimension, unchanged.  Reproductive: The uterus is surgically absent. No adnexal mass. Ovaries are not discretely visualized.  Bladder: Physiologically distended.  Other: No free air, free fluid, or intra-abdominal fluid collection. Small fat containing umbilical hernia.  Musculoskeletal: There are no acute or suspicious osseous abnormalities.  IMPRESSION: 1. No acute abnormality in the abdomen/pelvis. 2. Some degree of bowel malrotation, duodenum does not cross the midline, majority of small bowel in the right abdomen and colon on the left. 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 No bowel compromise or obstruction.   Electronically Signed   By: Jeb Levering M.D.   On: 12/13/2014 04:04   Dg Abd Acute W/chest  12/12/2014   CLINICAL DATA:  Epigastric pain for 5 days. Nausea and vomiting. Constipation.  EXAM: DG ABDOMEN ACUTE W/ 1V CHEST  COMPARISON:  Chest radiograph on 09/06/2014  FINDINGS: There is no evidence of dilated bowel loops or free intraperitoneal air. Pelvic phleboliths noted. Surgical clips noted in right upper quadrant and right pelvis.  Heart size and mediastinal contours are within normal limits. Both lungs are clear.  IMPRESSION: Normal bowel gas pattern.  No active cardiopulmonary disease.   Electronically Signed   By: Earle Gell M.D.   On: 12/12/2014 17:22   I have personally reviewed and evaluated these images and lab results as part of my medical decision-making.   EKG Interpretation None      MDM   Final  diagnoses:  Pain of upper abdomen  Non-intractable vomiting with nausea, vomiting of unspecified type    Patient presents with continued upper abdominal pain. Has been seen and evaluated twice with full lab evaluation and acute abdominal series earlier today. She states that she's been unable to keep her medications down. Screening urinalysis to evaluate for further dehydration obtained. Ketones earlier today greater than 40 now greater than 80. Glucose is 209. Suspect ketones related to dehydration giving reassuring lab work earlier today (mild hyperglycemia, normal bicarb and no anion gap).  Patient was given 2 L of fluid. She was given an enema, Reglan, Benadryl. While I feel that repeat CT will likely be reassuring, given persistent vomiting, will obtain CT scan to rule out obstruction. Discussed this with the patient. Given concerns for constipation, at this time and do not feel narcotic pain medication will benefit the patient.   CT scan reassuring. 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 I discussed this with the radiologist. She states that this is similar to prior exams and likely congenital in nature.   Throughout patient's stay, she continues to endorse nausea and pain. Patient was given Tylenol and 2 mg dose of Haldol for her nausea. On multiple repeat exam, patient appears comfortable. She continues to endorse pain. During her initial by mouth challenge, the nurse states that she vomited a small amount of emesis basin. She was remedicated. On multiple rechecks, I have encouraged the patient to try to drink.  She is hesitant to do so. Given reassuring workup earlier today, I do not feel that she meets criteria for admission. Discussed with patient continued supportive of care home. She will be given Phenergan suppositories as well as a MiraLAX and enema regimen for concern for constipation.    Merryl Hacker, MD 12/13/14 [id number]  Merryl Hacker, MD 12/13/14 0630

## 2014-12-13 NOTE — ED Notes (Signed)
Pt states she is having extreme abdominal pain and just needs to have a bm

## 2014-12-18 ENCOUNTER — Telehealth: Payer: Self-pay | Admitting: Family Medicine

## 2014-12-18 NOTE — Telephone Encounter (Signed)
Called to confirm appt 12/19/14 10:45am, pt said she is coming. Pt states she was released from hospital yesterday and was there 5 days for blood in stomach.

## 2014-12-19 ENCOUNTER — Ambulatory Visit: Payer: 59 | Admitting: Family Medicine

## 2014-12-19 NOTE — Telephone Encounter (Signed)
Transition Care Management Follow-up Telephone Call   Date discharged? 12/17/14 (from [facility])    How have you been since you were released from the hospital? Patient is still feeling weak but no abdominal pain, no appetite.    Do you understand why you were in the hospital? YES- nausea, vomiting, blood in stomach    Do you understand the discharge instructions? YES    Where were you discharged to? 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 YES   Confirmed importance and date/time of follow-up visits scheduled  YES- scheduled 12/20/14  Provider Appointment booked with Dr. Birdie Riddle   Confirmed with patient if condition begins to worsen call PCP or go to the ER.  Patient was given the office number and encouraged to call back with question or concerns.  : YES

## 2014-12-20 ENCOUNTER — Encounter: Payer: Self-pay | Admitting: Family Medicine

## 2014-12-20 ENCOUNTER — Ambulatory Visit (INDEPENDENT_AMBULATORY_CARE_PROVIDER_SITE_OTHER): Payer: Medicaid Other | Admitting: Family Medicine

## 2014-12-20 ENCOUNTER — Encounter: Payer: Self-pay | Admitting: Cardiology

## 2014-12-20 VITALS — BP 152/88 | HR 88 | Temp 98.6°F | Resp 18 | Ht 61.0 in | Wt 176.2 lb

## 2014-12-20 DIAGNOSIS — I10 Essential (primary) hypertension: Secondary | ICD-10-CM

## 2014-12-20 DIAGNOSIS — K209 Esophagitis, unspecified without bleeding: Secondary | ICD-10-CM | POA: Insufficient documentation

## 2014-12-20 MED ORDER — SUCRALFATE 1 G PO TABS: 1.0000 g | ORAL_TABLET | Freq: Three times a day (TID) | ORAL | Status: AC

## 2014-12-20 MED ORDER — CLONIDINE HCL 0.1 MG PO TABS: 0.1000 mg | ORAL_TABLET | Freq: Two times a day (BID) | ORAL | Status: DC

## 2014-12-20 NOTE — Progress Notes (Signed)
   Subjective:    Patient ID: Crystal Peck, female    DOB: 1965-05-11, 49 y.o.   MRN: [id number]  [facility] f/u- pt was admitted to [facility] 8/25-29 for Hematemesis.  Pt had EGD while there and gastritis was present.  Pt was told to hold Xarelto x1 week.  D/c summary recommended f/u w/ GI- [facility].  According to d/c summary, 'she was doing much better, and was able to eat some [employer] last night. She notes her diet is much different at home, as she does not like hospital food but overall she feels markedly improved and feels back to her normal self. She is agreeable to go home'.  Pt reports she is 'still weak.  Blurry'.  Eating chicken noodle soup.   Pt reports she continues to have epigastric pain.  Currently on Protonix twice daily.  Denies diarrhea.  Denies vomiting since d/c.  Pt was told to restart Amlodipine at time of d/c from hospital but she has not done this.  Pt has a new pt appt on 9/20 to establish care at [facility] center.    Review of Systems For ROS see HPI     Objective:   Physical Exam  Constitutional: She is oriented to person, place, and time. She appears well-developed and well-nourished. No distress.  Sitting in wheelchair  HENT:  Head: Normocephalic and atraumatic.  Cardiovascular: Normal rate, regular rhythm, normal heart sounds and intact distal pulses.   Pulmonary/Chest: Effort normal and breath sounds normal. No respiratory distress. She has no wheezes.  Abdominal: Soft. Bowel sounds are normal. She exhibits no distension. There is no tenderness. There is no rebound and no guarding.  Neurological: She is alert and oriented to person, place, and time.  Skin: Skin is warm and dry.  Psychiatric: She has a normal mood and affect. Her behavior is normal. Thought content normal.  Vitals reviewed.         Assessment & Plan:

## 2014-12-20 NOTE — Patient Instructions (Signed)
You have an appt to meet your doctor on Sept 20th at [place]- you can follow up on your blood pressure at that time Add the Clonidine twice daily to the Atenolol and the Lisinopril HCT Start the Carafate prior to meals and before bed to allow healing Call with any questions or concerns Hang in there!

## 2014-12-20 NOTE — Telephone Encounter (Signed)
This encounter was created in error - please disregard.

## 2014-12-20 NOTE — Progress Notes (Signed)
Pre visit review using our clinic review tool, if applicable. No additional management support is needed unless otherwise documented below in the visit note. 

## 2014-12-20 NOTE — Telephone Encounter (Signed)
New problem   Pt returning your call and stated she won't be able to answer phone until after 3:15.

## 2014-12-25 NOTE — Assessment & Plan Note (Signed)
Chronic problem.  Not well controlled.  Asymptomatic at this time.  Pt had swelling w/ Amlodipine.  Max'ed out on Lisinopril.  Would consider switching atenolol to more potent Metoprolol but since this was started by Cards, I am hesitant to switch this.  Will start Clonidine to improve BP.  Pt to monitor for improvement and f/u w/ new MD as scheduled.

## 2014-12-25 NOTE — Assessment & Plan Note (Signed)
New.  Noted on EGD done while hospitalized at [facility].  Pt has GI follow up but she reports she is unable to eat w/o pain.  Based on this, will add Sucralfate to her PPI to allow mucosal healing.  Pt expressed understanding and is in agreement w/ plan.

## 2015-01-01 LAB — HM MAMMOGRAPHY

## 2015-01-02 ENCOUNTER — Ambulatory Visit: Payer: 59 | Admitting: Family Medicine

## 2015-01-09 ENCOUNTER — Encounter: Payer: Self-pay | Admitting: General Practice

## 2015-01-18 ENCOUNTER — Other Ambulatory Visit: Payer: 59

## 2015-01-31 ENCOUNTER — Encounter (HOSPITAL_BASED_OUTPATIENT_CLINIC_OR_DEPARTMENT_OTHER): Payer: Self-pay | Admitting: Emergency Medicine

## 2015-01-31 ENCOUNTER — Emergency Department (HOSPITAL_BASED_OUTPATIENT_CLINIC_OR_DEPARTMENT_OTHER)
Admission: EM | Admit: 2015-01-31 | Discharge: 2015-01-31 | Disposition: A | Discharge status: Home / Self Care | Payer: Medicaid Other | Attending: Emergency Medicine | Admitting: Emergency Medicine

## 2015-01-31 DIAGNOSIS — E669 Obesity, unspecified: Secondary | ICD-10-CM | POA: Insufficient documentation

## 2015-01-31 DIAGNOSIS — E785 Hyperlipidemia, unspecified: Secondary | ICD-10-CM | POA: Insufficient documentation

## 2015-01-31 DIAGNOSIS — K219 Gastro-esophageal reflux disease without esophagitis: Secondary | ICD-10-CM | POA: Insufficient documentation

## 2015-01-31 DIAGNOSIS — Z86018 Personal history of other benign neoplasm: Secondary | ICD-10-CM | POA: Insufficient documentation

## 2015-01-31 DIAGNOSIS — I10 Essential (primary) hypertension: Secondary | ICD-10-CM | POA: Diagnosis not present

## 2015-01-31 DIAGNOSIS — Z7901 Long term (current) use of anticoagulants: Secondary | ICD-10-CM | POA: Diagnosis not present

## 2015-01-31 DIAGNOSIS — E1143 Type 2 diabetes mellitus with diabetic autonomic (poly)neuropathy: Secondary | ICD-10-CM | POA: Diagnosis not present

## 2015-01-31 DIAGNOSIS — J45909 Unspecified asthma, uncomplicated: Secondary | ICD-10-CM | POA: Diagnosis not present

## 2015-01-31 DIAGNOSIS — Z794 Long term (current) use of insulin: Secondary | ICD-10-CM | POA: Insufficient documentation

## 2015-01-31 DIAGNOSIS — I4891 Unspecified atrial fibrillation: Secondary | ICD-10-CM | POA: Diagnosis not present

## 2015-01-31 DIAGNOSIS — Z7984 Long term (current) use of oral hypoglycemic drugs: Secondary | ICD-10-CM | POA: Insufficient documentation

## 2015-01-31 DIAGNOSIS — K3184 Gastroparesis: Secondary | ICD-10-CM | POA: Insufficient documentation

## 2015-01-31 DIAGNOSIS — Z79899 Other long term (current) drug therapy: Secondary | ICD-10-CM | POA: Diagnosis not present

## 2015-01-31 DIAGNOSIS — Z7951 Long term (current) use of inhaled steroids: Secondary | ICD-10-CM | POA: Insufficient documentation

## 2015-01-31 DIAGNOSIS — R1110 Vomiting, unspecified: Secondary | ICD-10-CM | POA: Diagnosis present

## 2015-01-31 DIAGNOSIS — Z8669 Personal history of other diseases of the nervous system and sense organs: Secondary | ICD-10-CM | POA: Insufficient documentation

## 2015-01-31 LAB — COMPREHENSIVE METABOLIC PANEL
ALBUMIN: 4.2 g/dL (ref 3.5–5.0)
ALT: 40 U/L (ref 14–54)
ANION GAP: 12 (ref 5–15)
AST: 25 U/L (ref 15–41)
Alkaline Phosphatase: 110 U/L (ref 38–126)
BUN: 8 mg/dL (ref 6–20)
CO2: 27 mmol/L (ref 22–32)
Calcium: 9.1 mg/dL (ref 8.9–10.3)
Chloride: 98 mmol/L — ABNORMAL LOW (ref 101–111)
Creatinine, Ser: 0.6 mg/dL (ref 0.44–1.00)
GFR calc Af Amer: >60 mL/min (ref >60)
GFR calc non Af Amer: >60 mL/min (ref >60)
GLUCOSE: 203 mg/dL — AB (ref 65–99)
POTASSIUM: 3.2 mmol/L — AB (ref 3.5–5.1)
SODIUM: 137 mmol/L (ref 135–145)
Total Bilirubin: 1 mg/dL (ref 0.3–1.2)
Total Protein: 7.7 g/dL (ref 6.5–8.1)

## 2015-01-31 LAB — CBC WITH DIFFERENTIAL/PLATELET
BASOS PCT: 0 %
Basophils Absolute: 0 10*3/uL (ref 0.0–0.1)
EOS ABS: 0 10*3/uL (ref 0.0–0.7)
EOS PCT: 0 %
HCT: 35.5 % — ABNORMAL LOW (ref 36.0–46.0)
Hemoglobin: 11.9 g/dL — ABNORMAL LOW (ref 12.0–15.0)
Lymphocytes Relative: 8 %
Lymphs Abs: 0.5 10*3/uL — ABNORMAL LOW (ref 0.7–4.0)
MCH: 28.6 pg (ref 26.0–34.0)
MCHC: 33.5 g/dL (ref 30.0–36.0)
MCV: 85.3 fL (ref 78.0–100.0)
MONO ABS: 0.2 10*3/uL (ref 0.1–1.0)
MONOS PCT: 2 %
NEUTROS PCT: 90 %
Neutro Abs: 6.4 10*3/uL (ref 1.7–7.7)
Platelets: 357 10*3/uL (ref 150–400)
RBC: 4.16 MIL/uL (ref 3.87–5.11)
RDW: 12 % (ref 11.5–15.5)
WBC: 7.1 10*3/uL (ref 4.0–10.5)

## 2015-01-31 LAB — URINE MICROSCOPIC-ADD ON

## 2015-01-31 LAB — URINALYSIS, ROUTINE W REFLEX MICROSCOPIC
BILIRUBIN URINE: NEGATIVE
Ketones, ur: 40 mg/dL — AB
Leukocytes, UA: NEGATIVE
NITRITE: NEGATIVE
PH: 6.5 (ref 5.0–8.0)
Protein, ur: NEGATIVE mg/dL
Specific Gravity, Urine: 1.015 (ref 1.005–1.030)
Urobilinogen, UA: 0.2 mg/dL (ref 0.0–1.0)

## 2015-01-31 LAB — LIPASE, BLOOD: Lipase: 27 U/L (ref 22–51)

## 2015-01-31 LAB — I-STAT CG4 LACTIC ACID, ED: LACTIC ACID, VENOUS: 1.86 mmol/L (ref 0.5–2.0)

## 2015-01-31 MED ORDER — ACETAMINOPHEN 500 MG PO TABS
1000.0000 mg | ORAL_TABLET | Freq: Once | ORAL | Status: AC
  Administered 2015-01-31: 1000 mg via ORAL
  Filled 2015-01-31: qty 2

## 2015-01-31 MED ORDER — SODIUM CHLORIDE 0.9 % IV BOLUS (SEPSIS)
1000.0000 mL | Freq: Once | INTRAVENOUS | Status: AC
  Administered 2015-01-31: 1000 mL via INTRAVENOUS

## 2015-01-31 MED ORDER — HALOPERIDOL LACTATE 5 MG/ML IJ SOLN
5.0000 mg | Freq: Once | INTRAMUSCULAR | Status: AC
  Administered 2015-01-31: 5 mg via INTRAVENOUS
  Filled 2015-01-31: qty 1

## 2015-01-31 NOTE — ED Notes (Signed)
MD at bedside. 

## 2015-01-31 NOTE — ED Provider Notes (Signed)
CSN: [id number]     Arrival date & time 01/31/15  1340 History   First MD Initiated Contact with Patient 01/31/15 1413     Chief Complaint  Patient presents with  . Emesis     (Consider location/radiation/quality/duration/timing/severity/associated sxs/prior Treatment) Patient is a 49 y.o. female presenting with vomiting. The history is provided by the patient.  Emesis Severity:  Moderate Duration:  1 day Timing:  Constant Quality:  Stomach contents Progression:  Unchanged Chronicity:  Chronic Recent urination:  Normal Relieved by:  Nothing Worsened by:  Nothing tried Ineffective treatments:  Antiemetics Associated symptoms: abdominal pain (ongoing upper)   Associated symptoms: no diarrhea and no fever   Risk factors: diabetes   Risk factors comment:  Recent admission for chronic abdominal pain and vomiting   Past Medical History  Diagnosis Date  . Asthma   . Diabetes mellitus   . Hyperlipidemia   . Hypertension   . GERD (gastroesophageal reflux disease)   . Cerebellar tumor ([place])   . Obesity   . Sleep apnea   . A-fib ([place])   . Thyroid disease   . Chest pain 03/27/2014    negative lexiscan myoview   Past Surgical History  Procedure Laterality Date  . Cholecystectomy    . Partial hysterectomy      ovaries remain  . Nasal sinus surgery      Dr. Lyn Hollingshead  . Abdominal hysterectomy     Family History  Problem Relation Age of Onset  . Coronary artery disease Father   . Hypertension Father   . Diabetes      maternal grandparents  . Hyperlipidemia Other    Social History  Substance Use Topics  . Smoking status: Never Smoker   . Smokeless tobacco: Never Used  . Alcohol Use: No   OB History    No data available     Review of Systems  Gastrointestinal: Positive for vomiting and abdominal pain (ongoing upper). Negative for diarrhea.  All other systems reviewed and are negative.     Allergies  Review of patient's allergies indicates no known  allergies.  Home Medications   Prior to Admission medications   Medication Sig Start Date End Date Taking? Authorizing Provider  ALPRAZolam Duanne Moron) 0.5 MG tablet Take 1 tablet (0.5 mg total) by mouth 2 (two) times daily as needed for anxiety. 10/29/14  Yes Midge Minium, MD  atenolol (TENORMIN) 100 MG tablet Take 1 tablet (100 mg total) by mouth every evening. 10/15/14  Yes Midge Minium, MD  atenolol (TENORMIN) 25 MG tablet Take 1 tablet (25 mg total) by mouth daily as needed. 09/13/14  Yes Sueanne Margarita, MD  atorvastatin (LIPITOR) 80 MG tablet Take 1 tablet (80 mg total) by mouth daily. 11/22/14  Yes Sueanne Margarita, MD  azelastine (OPTIVAR) 0.05 % ophthalmic solution Place 1 drop into both eyes 2 (two) times daily. 10/15/14  Yes Midge Minium, MD  beclomethasone (QVAR) 80 MCG/ACT inhaler Inhale 3 puffs into the lungs daily. 10/12/14  Yes Colon Branch, MD  cetirizine (ZYRTEC) 10 MG tablet Take 1 tablet (10 mg total) by mouth daily. 10/15/14  Yes Midge Minium, MD  cloNIDine (CATAPRES) 0.1 MG tablet Take 1 tablet (0.1 mg total) by mouth 2 (two) times daily. 12/20/14  Yes Midge Minium, MD  glipiZIDE (GLUCOTROL XL) 10 MG 24 hr tablet Take 10 mg by mouth daily with breakfast.   Yes Historical Provider, MD  glucose blood test strip 1 each  by Other route as needed. Onetouch ultra test strip    Yes Historical Provider, MD  IRON PO Take 2 tablets by mouth 3 (three) times a week.    Yes Historical Provider, MD  LANTUS SOLOSTAR 100 UNIT/ML Solostar Pen Inject 20 Units into the skin 4 (four) times daily -  before meals and at bedtime. AS DIRECTED 02/27/14  Yes Historical Provider, MD  metFORMIN (GLUCOPHAGE) 1000 MG tablet Take 1,000 mg by mouth 2 (two) times daily with a meal.   Yes Historical Provider, MD  methimazole (TAPAZOLE) 5 MG tablet Take 7.5 mg by mouth every evening.  02/27/14  Yes Historical Provider, MD  Multiple Vitamin (MULTIVITAMIN WITH MINERALS) TABS tablet Take 1 tablet by  mouth every evening.    Yes Historical Provider, MD  Omega-3 Fatty Acids (FISH OIL) 1000 MG CAPS Take 2,000 mg by mouth every evening.    Yes Historical Provider, MD  potassium chloride SA (K-DUR,KLOR-CON) 20 MEQ tablet Take 20 mEq by mouth daily as needed (for leg cramping). Take one tablet by mouth daily. 02/26/12  Yes Midge Minium, MD  Vitamin D, Ergocalciferol, (DRISDOL) 50000 UNITS CAPS capsule Take 50,000 Units by mouth every 7 (seven) days.   Yes Historical Provider, MD  albuterol (PROVENTIL HFA;VENTOLIN HFA) 108 (90 BASE) MCG/ACT inhaler Inhale 2 puffs into the lungs every 4 (four) hours as needed for wheezing or shortness of breath.    Historical Provider, MD  canagliflozin (INVOKANA) 300 MG TABS tablet Take 300 mg by mouth daily before breakfast.    Historical Provider, MD  dicyclomine (BENTYL) 20 MG tablet Take 1 tablet (20 mg total) by mouth 2 (two) times daily. 12/12/14   Dorie Rank, MD  escitalopram (LEXAPRO) 10 MG tablet Take 1 tablet (10 mg total) by mouth daily. 10/15/14   Midge Minium, MD  lisinopril-hydrochlorothiazide (PRINZIDE,ZESTORETIC) 20-12.5 MG per tablet Take 1 tablet by mouth 2 (two) times daily. 11/26/14   Midge Minium, MD  metoCLOPramide (REGLAN) 10 MG tablet Take 1 tablet (10 mg total) by mouth every 6 (six) hours as needed for nausea (nausea/headache). 12/11/14   Orpah Greek, MD  ondansetron (ZOFRAN ODT) 4 MG disintegrating tablet Take 1 tablet (4 mg total) by mouth every 8 (eight) hours as needed for nausea or vomiting. Patient not taking: Reported on 12/19/2014 06/19/14   Pleas Koch, NP  pantoprazole (PROTONIX) 40 MG tablet Take 1 tablet (40 mg total) by mouth daily. Patient taking differently: Take 40 mg by mouth 2 (two) times daily.  06/07/14   Midge Minium, MD  polyethylene glycol powder [facility]) powder Take one capful by mouth twice daily until bowels are soft. 12/13/14   Merryl Hacker, MD  promethazine (PHENERGAN) 25 MG  suppository Place 1 suppository (25 mg total) rectally every 6 (six) hours as needed for nausea or vomiting. 12/13/14   Merryl Hacker, MD  rivaroxaban (XARELTO) 20 MG TABS tablet Take 1 tablet (20 mg total) by mouth daily with supper. Patient not taking: Reported on 12/19/2014 07/02/14   Midge Minium, MD  sodium phosphate (FLEET) 7-19 GM/118ML ENEM Place 133 mLs (1 enema total) rectally daily as needed for severe constipation. 12/13/14   Merryl Hacker, MD  sucralfate (CARAFATE) 1 G tablet Take 1 tablet (1 g total) by mouth 4 (four) times daily -  with meals and at bedtime. 12/20/14   Midge Minium, MD   BP 196/95 mmHg  Pulse 79  Temp(Src) 98.4 F (36.9  C) (Oral)  Resp 20  Ht 5\' 1"  (1.549 m)  Wt 165 lb (74.844 kg)  BMI 31.19 kg/m2  SpO2 100% Physical Exam  Constitutional: She is oriented to person, place, and time. She appears well-developed and well-nourished. No distress.  HENT:  Head: Normocephalic.  Eyes: Conjunctivae are normal.  Neck: Neck supple. No tracheal deviation present.  Cardiovascular: Normal rate, regular rhythm and normal heart sounds.   Pulmonary/Chest: Effort normal and breath sounds normal. No respiratory distress.  Abdominal: Soft. Bowel sounds are normal. She exhibits no distension. There is tenderness (mild epigastric). There is no rebound and no guarding.  Neurological: She is alert and oriented to person, place, and time.  Skin: Skin is warm and dry.  Psychiatric: She has a normal mood and affect.    ED Course  Procedures (including critical care time) Labs Review Labs Reviewed  URINALYSIS, ROUTINE W REFLEX MICROSCOPIC (NOT AT [facility]) - Abnormal; Notable for the following:    Glucose, UA >1000 (*)    Hgb urine dipstick TRACE (*)    Ketones, ur 40 (*)    All other components within normal limits  CBC WITH DIFFERENTIAL/PLATELET - Abnormal; Notable for the following:    Hemoglobin 11.9 (*)    HCT 35.5 (*)    Lymphs Abs 0.5 (*)    All other  components within normal limits  COMPREHENSIVE METABOLIC PANEL - Abnormal; Notable for the following:    Potassium 3.2 (*)    Chloride 98 (*)    Glucose, Bld 203 (*)    All other components within normal limits  URINE MICROSCOPIC-ADD ON - Abnormal; Notable for the following:    Bacteria, UA FEW (*)    All other components within normal limits  LIPASE, BLOOD  I-STAT CG4 LACTIC ACID, ED    Imaging Review No results found. I have personally reviewed and evaluated these images and lab results as part of my medical decision-making.   EKG Interpretation None      MDM   Final diagnoses:  Gastroparesis due to DM [facility])   49 y.o. female presents with recurrent gastroparesis symptoms after being discharged yesterday. AFVSS and otherwise well appearing. No acute metabolic or infectious etiology identified  Not vomiting on arrival but stating has refractory nausea and pain. Nausea better with haldol, pain improved but still present. Recommended tylenol, provided a dose in the ED. Plan to follow up with PCP as needed and return precautions discussed for worsening or new concerning symptoms.    Leo Grosser, MD 01/31/15 [id number]

## 2015-01-31 NOTE — Discharge Instructions (Signed)
Gastroparesis °Gastroparesis, also called delayed gastric emptying, is a condition in which food takes longer than normal to empty from the stomach. The condition is usually long-lasting (chronic). °CAUSES °This condition may be caused by: °· An endocrine disorder, such as hypothyroidism or diabetes. Diabetes is the most common cause of this condition. °· A nervous system disease, such as Parkinson disease or multiple sclerosis. °· Cancer, infection, or surgery of the stomach or vagus nerve. °· A connective tissue disorder, such as scleroderma. °· Certain medicines. °In most cases, the cause is not known. °RISK FACTORS °This condition is more likely to develop in: °· People with certain disorders, including endocrine disorders, eating disorders, amyloidosis, and scleroderma. °· People with certain diseases, including Parkinson disease or multiple sclerosis. °· People with cancer or infection of the stomach or vagus nerve. °· People who have had surgery on the stomach or vagus nerve. °· People who take certain medicines. °· Women. °SYMPTOMS °Symptoms of this condition include: °· An early feeling of fullness when eating. °· Nausea. °· Weight loss. °· Vomiting. °· Heartburn. °· Abdominal bloating. °· Inconsistent blood glucose levels. °· Lack of appetite. °· Acid from the stomach coming up into the esophagus (gastroesophageal reflux). °· Spasms of the stomach. °Symptoms may come and go. °DIAGNOSIS °This condition is diagnosed with tests, such as: °· Tests that check how long it takes food to move through the stomach and intestines. These tests include: °¨ Upper gastrointestinal (GI) series. In this test, X-rays of the intestines are taken after you drink a liquid. The liquid makes the intestines show up better on the X-rays. °¨ Gastric emptying scintigraphy. In this test, scans are taken after you eat food that contains a small amount of radioactive material. °¨ Wireless capsule GI monitoring system. This test  involves swallowing a capsule that records information about movement through the stomach. °· Gastric manometry. This test measures electrical and muscular activity in the stomach. It is done with a thin tube that is passed down the throat and into the stomach. °· Endoscopy. This test checks for abnormalities in the lining of the stomach. It is done with a long, thin tube that is passed down the throat and into the stomach. °· An ultrasound. This test can help rule out gallbladder disease or pancreatitis as a cause of your symptoms. It uses sound waves to take pictures of the inside of your body. °TREATMENT °There is no cure for gastroparesis. This condition may be managed with: °· Treatment of the underlying condition causing the gastroparesis. °· Lifestyle changes, including exercise and dietary changes. Dietary changes can include: °¨ Changes in what and when you eat. °¨ Eating smaller meals more often. °¨ Eating low-fat foods. °¨ Eating low-fiber forms of high-fiber foods, such as cooked vegetables instead of raw vegetables. °¨ Having liquid foods in place of solid foods. Liquid foods are easier to digest. °· Medicines. These may be given to control nausea and vomiting and to stimulate stomach muscles. °· Getting food through a feeding tube. This may be done in severe cases. °· A gastric neurostimulator. This is a device that is inserted into the body with surgery. It helps improve stomach emptying and control nausea and vomiting. °HOME CARE INSTRUCTIONS °· Follow your health care provider's instructions about exercise and diet. °· Take medicines only as directed by your health care provider. °SEEK MEDICAL CARE IF: °· Your symptoms do not improve with treatment. °· You have new symptoms. °SEEK IMMEDIATE MEDICAL CARE IF: °· You have   severe abdominal pain that does not improve with treatment. °· You have nausea that does not go away. °· You cannot keep fluids down. °  °This information is not intended to replace  advice given to you by your health care provider. Make sure you discuss any questions you have with your health care provider. °  °Document Released: 04/06/2005 Document Revised: 08/21/2014 Document Reviewed: 04/02/2014 °Elsevier Interactive Patient Education ©2016 [employer] ° °

## 2015-01-31 NOTE — ED Notes (Signed)
Pt states she was d/c from [facility] yesterday admit x 3 days for same

## 2015-01-31 NOTE — ED Notes (Signed)
Pt reports increased abdominal pain since this am, vomited x 2

## 2015-02-02 ENCOUNTER — Emergency Department (HOSPITAL_BASED_OUTPATIENT_CLINIC_OR_DEPARTMENT_OTHER)
Admission: EM | Admit: 2015-02-02 | Discharge: 2015-02-02 | Disposition: A | Discharge status: Home / Self Care | Payer: Medicaid Other | Attending: Physician Assistant | Admitting: Physician Assistant

## 2015-02-02 ENCOUNTER — Encounter (HOSPITAL_BASED_OUTPATIENT_CLINIC_OR_DEPARTMENT_OTHER): Payer: Self-pay | Admitting: Emergency Medicine

## 2015-02-02 DIAGNOSIS — E785 Hyperlipidemia, unspecified: Secondary | ICD-10-CM | POA: Insufficient documentation

## 2015-02-02 DIAGNOSIS — Z86011 Personal history of benign neoplasm of the brain: Secondary | ICD-10-CM | POA: Diagnosis not present

## 2015-02-02 DIAGNOSIS — E669 Obesity, unspecified: Secondary | ICD-10-CM | POA: Insufficient documentation

## 2015-02-02 DIAGNOSIS — E119 Type 2 diabetes mellitus without complications: Secondary | ICD-10-CM | POA: Diagnosis not present

## 2015-02-02 DIAGNOSIS — K219 Gastro-esophageal reflux disease without esophagitis: Secondary | ICD-10-CM | POA: Diagnosis not present

## 2015-02-02 DIAGNOSIS — Z79899 Other long term (current) drug therapy: Secondary | ICD-10-CM | POA: Diagnosis not present

## 2015-02-02 DIAGNOSIS — J45909 Unspecified asthma, uncomplicated: Secondary | ICD-10-CM | POA: Insufficient documentation

## 2015-02-02 DIAGNOSIS — K3184 Gastroparesis: Secondary | ICD-10-CM | POA: Diagnosis not present

## 2015-02-02 DIAGNOSIS — Z8669 Personal history of other diseases of the nervous system and sense organs: Secondary | ICD-10-CM | POA: Diagnosis not present

## 2015-02-02 DIAGNOSIS — R112 Nausea with vomiting, unspecified: Secondary | ICD-10-CM | POA: Diagnosis present

## 2015-02-02 DIAGNOSIS — I10 Essential (primary) hypertension: Secondary | ICD-10-CM | POA: Diagnosis not present

## 2015-02-02 LAB — URINALYSIS, ROUTINE W REFLEX MICROSCOPIC
BILIRUBIN URINE: NEGATIVE
Glucose, UA: >1000 mg/dL — AB
HGB URINE DIPSTICK: NEGATIVE
KETONES UR: NEGATIVE mg/dL
Leukocytes, UA: NEGATIVE
NITRITE: NEGATIVE
Protein, ur: NEGATIVE mg/dL
SPECIFIC GRAVITY, URINE: 1.011 (ref 1.005–1.030)
UROBILINOGEN UA: 0.2 mg/dL (ref 0.0–1.0)
pH: 6.5 (ref 5.0–8.0)

## 2015-02-02 LAB — URINE MICROSCOPIC-ADD ON

## 2015-02-02 MED ORDER — HALOPERIDOL LACTATE 5 MG/ML IJ SOLN
5.0000 mg | Freq: Once | INTRAMUSCULAR | Status: AC
  Administered 2015-02-02: 5 mg via INTRAMUSCULAR
  Filled 2015-02-02: qty 1

## 2015-02-02 MED ORDER — KETOROLAC TROMETHAMINE 30 MG/ML IJ SOLN
30.0000 mg | Freq: Once | INTRAMUSCULAR | Status: AC
  Administered 2015-02-02: 30 mg via INTRAMUSCULAR
  Filled 2015-02-02: qty 1

## 2015-02-02 NOTE — ED Provider Notes (Signed)
CSN: [id number]     Arrival date & time 02/02/15  6712 History   First MD Initiated Contact with Patient 02/02/15 1052     Chief Complaint  Patient presents with  . Emesis     (Consider location/radiation/quality/duration/timing/severity/associated sxs/prior Treatment) HPI   Patient is a 49 year old female presenting with gastroparesis. Patient has presented with similar presentation multiple times the past.   Patient says she has abdominal pain, vomiting. Patient has not vomited since arrival here. Patient says that Haldol usually works for her. Patient has follow-up with Dr. [name], motility expert at [facility] next week.    Past Medical History  Diagnosis Date  . Asthma   . Diabetes mellitus   . Hyperlipidemia   . Hypertension   . GERD (gastroesophageal reflux disease)   . Cerebellar tumor ([place])   . Obesity   . Sleep apnea   . A-fib ([place])   . Thyroid disease   . Chest pain 03/27/2014    negative lexiscan myoview   Past Surgical History  Procedure Laterality Date  . Cholecystectomy    . Partial hysterectomy      ovaries remain  . Nasal sinus surgery      Dr. Lyn Hollingshead  . Abdominal hysterectomy     Family History  Problem Relation Age of Onset  . Coronary artery disease Father   . Hypertension Father   . Diabetes      maternal grandparents  . Hyperlipidemia Other    Social History  Substance Use Topics  . Smoking status: Never Smoker   . Smokeless tobacco: Never Used  . Alcohol Use: No   OB History    No data available     Review of Systems  Constitutional: Negative for activity change.  Respiratory: Negative for shortness of breath.   Cardiovascular: Negative for chest pain.  Gastrointestinal: Positive for nausea, abdominal pain and diarrhea.  Genitourinary: Negative for dysuria.  Neurological: Negative for syncope.  Psychiatric/Behavioral: Negative for agitation.      Allergies  Review of patient's allergies indicates no known  allergies.  Home Medications   Prior to Admission medications   Medication Sig Start Date End Date Taking? Authorizing Provider  ALPRAZolam Duanne Moron) 0.5 MG tablet Take 1 tablet (0.5 mg total) by mouth 2 (two) times daily as needed for anxiety. 10/29/14  Yes Midge Minium, MD  atenolol (TENORMIN) 100 MG tablet Take 1 tablet (100 mg total) by mouth every evening. 10/15/14  Yes Midge Minium, MD  atorvastatin (LIPITOR) 80 MG tablet Take 1 tablet (80 mg total) by mouth daily. 11/22/14  Yes Sueanne Margarita, MD  azelastine (OPTIVAR) 0.05 % ophthalmic solution Place 1 drop into both eyes 2 (two) times daily. 10/15/14  Yes Midge Minium, MD  canagliflozin (INVOKANA) 300 MG TABS tablet Take 300 mg by mouth daily before breakfast.   Yes Historical Provider, MD  cetirizine (ZYRTEC) 10 MG tablet Take 1 tablet (10 mg total) by mouth daily. 10/15/14  Yes Midge Minium, MD  cloNIDine (CATAPRES) 0.1 MG tablet Take 1 tablet (0.1 mg total) by mouth 2 (two) times daily. 12/20/14  Yes Midge Minium, MD  dicyclomine (BENTYL) 20 MG tablet Take 1 tablet (20 mg total) by mouth 2 (two) times daily. 12/12/14  Yes Dorie Rank, MD  escitalopram (LEXAPRO) 10 MG tablet Take 1 tablet (10 mg total) by mouth daily. 10/15/14  Yes Midge Minium, MD  lisinopril-hydrochlorothiazide (PRINZIDE,ZESTORETIC) 20-12.5 MG per tablet Take 1 tablet by mouth 2 (  two) times daily. 11/26/14  Yes Midge Minium, MD  metFORMIN (GLUCOPHAGE) 1000 MG tablet Take 1,000 mg by mouth 2 (two) times daily with a meal.   Yes Historical Provider, MD  methimazole (TAPAZOLE) 5 MG tablet Take 7.5 mg by mouth every evening.  02/27/14  Yes Historical Provider, MD  metoCLOPramide (REGLAN) 10 MG tablet Take 1 tablet (10 mg total) by mouth every 6 (six) hours as needed for nausea (nausea/headache). 12/11/14  Yes Orpah Greek, MD  Multiple Vitamin (MULTIVITAMIN WITH MINERALS) TABS tablet Take 1 tablet by mouth every evening.    Yes Historical  Provider, MD  Omega-3 Fatty Acids (FISH OIL) 1000 MG CAPS Take 2,000 mg by mouth every evening.    Yes Historical Provider, MD  ondansetron (ZOFRAN ODT) 4 MG disintegrating tablet Take 1 tablet (4 mg total) by mouth every 8 (eight) hours as needed for nausea or vomiting. 06/19/14  Yes Pleas Koch, NP  potassium chloride SA (K-DUR,KLOR-CON) 20 MEQ tablet Take 20 mEq by mouth daily as needed (for leg cramping). Take one tablet by mouth daily. 02/26/12  Yes Midge Minium, MD  promethazine (PHENERGAN) 25 MG suppository Place 1 suppository (25 mg total) rectally every 6 (six) hours as needed for nausea or vomiting. 12/13/14  Yes Merryl Hacker, MD  sucralfate (CARAFATE) 1 G tablet Take 1 tablet (1 g total) by mouth 4 (four) times daily -  with meals and at bedtime. 12/20/14  Yes Midge Minium, MD  Vitamin D, Ergocalciferol, (DRISDOL) 50000 UNITS CAPS capsule Take 50,000 Units by mouth every 7 (seven) days.   Yes Historical Provider, MD  albuterol (PROVENTIL HFA;VENTOLIN HFA) 108 (90 BASE) MCG/ACT inhaler Inhale 2 puffs into the lungs every 4 (four) hours as needed for wheezing or shortness of breath.    Historical Provider, MD  atenolol (TENORMIN) 25 MG tablet Take 1 tablet (25 mg total) by mouth daily as needed. 09/13/14   Sueanne Margarita, MD  beclomethasone (QVAR) 80 MCG/ACT inhaler Inhale 3 puffs into the lungs daily. 10/12/14   Colon Branch, MD  glipiZIDE (GLUCOTROL XL) 10 MG 24 hr tablet Take 10 mg by mouth daily with breakfast.    Historical Provider, MD  glucose blood test strip 1 each by Other route as needed. Onetouch ultra test strip     Historical Provider, MD  IRON PO Take 2 tablets by mouth 3 (three) times a week.     Historical Provider, MD  LANTUS SOLOSTAR 100 UNIT/ML Solostar Pen Inject 20 Units into the skin 4 (four) times daily -  before meals and at bedtime. AS DIRECTED 02/27/14   Historical Provider, MD  pantoprazole (PROTONIX) 40 MG tablet Take 1 tablet (40 mg total) by mouth  daily. Patient taking differently: Take 40 mg by mouth 2 (two) times daily.  06/07/14   Midge Minium, MD   BP 190/89 mmHg  Pulse 82  Temp(Src) 98.6 F (37 C) (Oral)  Resp 16  Wt 165 lb (74.844 kg)  SpO2 100% Physical Exam  Constitutional: She is oriented to person, place, and time. She appears well-developed and well-nourished.  HENT:  Head: Normocephalic and atraumatic.  Eyes: Right eye exhibits no discharge.  Cardiovascular: Normal rate.   Pulmonary/Chest: Effort normal. No respiratory distress.  Abdominal: Soft. There is tenderness.  Musculoskeletal: Normal range of motion.  Neurological: She is oriented to person, place, and time.  Skin: Skin is warm and dry. She is not diaphoretic.  Psychiatric: She has  a normal mood and affect.  Nursing note and vitals reviewed.   ED Course  Procedures (including critical care time) Labs Review Labs Reviewed  URINALYSIS, ROUTINE W REFLEX MICROSCOPIC (NOT AT [facility]) - Abnormal; Notable for the following:    Glucose, UA >1000 (*)    All other components within normal limits  URINE MICROSCOPIC-ADD ON - Abnormal; Notable for the following:    Squamous Epithelial / LPF FEW (*)    Bacteria, UA FEW (*)    All other components within normal limits    Imaging Review No results found. I have personally reviewed and evaluated these images and lab results as part of my medical decision-making.   EKG Interpretation None      MDM   Final diagnoses:  None    Patient is a 49 year old female with past medical history history significant for chronic abdominal pain and gastroparesis. She is presenting today with gastroparesis and abdominal pain. Patient states this is her usual abdominal pain and nausea. Patient has not no evidence of vomiting since arrival here for the last hour and half. She has moist meters membranes. We will give her IM medications to help with this. We will give both Haldol and ketorolac. I am looking to avoid any kind  of narcotic medications in this female.  1:49 PM Patient tolerated food and drink. Asking to return home.  Patietn given return precautions for fever or abdominal pain different than usual.   Canaan Holzer Julio Alm, MD 02/02/15 1350

## 2015-02-02 NOTE — Discharge Instructions (Signed)
Please return with fever or other concerns.   Gastroparesis Gastroparesis, also called delayed gastric emptying, is a condition in which food takes longer than normal to empty from the stomach. The condition is usually long-lasting (chronic). CAUSES This condition may be caused by:  An endocrine disorder, such as hypothyroidism or diabetes. Diabetes is the most common cause of this condition.  A nervous system disease, such as Parkinson disease or multiple sclerosis.  Cancer, infection, or surgery of the stomach or vagus nerve.  A connective tissue disorder, such as scleroderma.  Certain medicines. In most cases, the cause is not known. RISK FACTORS This condition is more likely to develop in:  People with certain disorders, including endocrine disorders, eating disorders, amyloidosis, and scleroderma.  People with certain diseases, including Parkinson disease or multiple sclerosis.  People with cancer or infection of the stomach or vagus nerve.  People who have had surgery on the stomach or vagus nerve.  People who take certain medicines.  Women. SYMPTOMS Symptoms of this condition include:  An early feeling of fullness when eating.  Nausea.  Weight loss.  Vomiting.  Heartburn.  Abdominal bloating.  Inconsistent blood glucose levels.  Lack of appetite.  Acid from the stomach coming up into the esophagus (gastroesophageal reflux).  Spasms of the stomach. Symptoms may come and go. DIAGNOSIS This condition is diagnosed with tests, such as:  Tests that check how long it takes food to move through the stomach and intestines. These tests include:  Upper gastrointestinal (GI) series. In this test, X-rays of the intestines are taken after you drink a liquid. The liquid makes the intestines show up better on the X-rays.  Gastric emptying scintigraphy. In this test, scans are taken after you eat food that contains a small amount of radioactive material.  Wireless  capsule GI monitoring system. This test involves swallowing a capsule that records information about movement through the stomach.  Gastric manometry. This test measures electrical and muscular activity in the stomach. It is done with a thin tube that is passed down the throat and into the stomach.  Endoscopy. This test checks for abnormalities in the lining of the stomach. It is done with a long, thin tube that is passed down the throat and into the stomach.  An ultrasound. This test can help rule out gallbladder disease or pancreatitis as a cause of your symptoms. It uses sound waves to take pictures of the inside of your body. TREATMENT There is no cure for gastroparesis. This condition may be managed with:  Treatment of the underlying condition causing the gastroparesis.  Lifestyle changes, including exercise and dietary changes. Dietary changes can include:  Changes in what and when you eat.  Eating smaller meals more often.  Eating low-fat foods.  Eating low-fiber forms of high-fiber foods, such as cooked vegetables instead of raw vegetables.  Having liquid foods in place of solid foods. Liquid foods are easier to digest.  Medicines. These may be given to control nausea and vomiting and to stimulate stomach muscles.  Getting food through a feeding tube. This may be done in severe cases.  A gastric neurostimulator. This is a device that is inserted into the body with surgery. It helps improve stomach emptying and control nausea and vomiting. HOME CARE INSTRUCTIONS  Follow your health care provider's instructions about exercise and diet.  Take medicines only as directed by your health care provider. SEEK MEDICAL CARE IF:  Your symptoms do not improve with treatment.  You have new  symptoms. SEEK IMMEDIATE MEDICAL CARE IF:  You have severe abdominal pain that does not improve with treatment.  You have nausea that does not go away.  You cannot keep fluids down.   This  information is not intended to replace advice given to you by your health care provider. Make sure you discuss any questions you have with your health care provider.   Document Released: 04/06/2005 Document Revised: 08/21/2014 Document Reviewed: 04/02/2014 Elsevier Interactive Patient Education [employer].

## 2015-02-02 NOTE — ED Notes (Signed)
Pt seen multiple times in past week, evaluated here thur given iv fluids and antiemetics, pt states she felt better until this am and developed vomiting, reports vomiting x 2, pt is drousy, follows commands but speech is somewhat slurred, neuro intact

## 2015-02-02 NOTE — ED Notes (Signed)
Ice chips given per vorb from Dr Thomasene Lot

## 2015-02-04 ENCOUNTER — Inpatient Hospital Stay (HOSPITAL_COMMUNITY): Payer: Medicaid Other

## 2015-02-04 ENCOUNTER — Encounter (HOSPITAL_BASED_OUTPATIENT_CLINIC_OR_DEPARTMENT_OTHER): Payer: Self-pay | Admitting: Emergency Medicine

## 2015-02-04 ENCOUNTER — Inpatient Hospital Stay (HOSPITAL_BASED_OUTPATIENT_CLINIC_OR_DEPARTMENT_OTHER)
Admission: EM | Admit: 2015-02-04 | Discharge: 2015-02-09 | DRG: 074 | Disposition: A | Discharge status: Home / Self Care | Payer: Medicaid Other | Attending: Internal Medicine | Admitting: Internal Medicine

## 2015-02-04 DIAGNOSIS — E785 Hyperlipidemia, unspecified: Secondary | ICD-10-CM | POA: Diagnosis present

## 2015-02-04 DIAGNOSIS — K3184 Gastroparesis: Secondary | ICD-10-CM | POA: Diagnosis present

## 2015-02-04 DIAGNOSIS — R9431 Abnormal electrocardiogram [ECG] [EKG]: Secondary | ICD-10-CM

## 2015-02-04 DIAGNOSIS — E876 Hypokalemia: Secondary | ICD-10-CM | POA: Diagnosis present

## 2015-02-04 DIAGNOSIS — R112 Nausea with vomiting, unspecified: Secondary | ICD-10-CM | POA: Diagnosis present

## 2015-02-04 DIAGNOSIS — E0500 Thyrotoxicosis with diffuse goiter without thyrotoxic crisis or storm: Secondary | ICD-10-CM | POA: Diagnosis present

## 2015-02-04 DIAGNOSIS — I480 Paroxysmal atrial fibrillation: Secondary | ICD-10-CM | POA: Diagnosis present

## 2015-02-04 DIAGNOSIS — G4730 Sleep apnea, unspecified: Secondary | ICD-10-CM | POA: Diagnosis present

## 2015-02-04 DIAGNOSIS — E1165 Type 2 diabetes mellitus with hyperglycemia: Secondary | ICD-10-CM | POA: Diagnosis present

## 2015-02-04 DIAGNOSIS — I10 Essential (primary) hypertension: Secondary | ICD-10-CM | POA: Diagnosis present

## 2015-02-04 DIAGNOSIS — K219 Gastro-esophageal reflux disease without esophagitis: Secondary | ICD-10-CM | POA: Diagnosis present

## 2015-02-04 DIAGNOSIS — E1143 Type 2 diabetes mellitus with diabetic autonomic (poly)neuropathy: Secondary | ICD-10-CM | POA: Diagnosis present

## 2015-02-04 DIAGNOSIS — K5900 Constipation, unspecified: Secondary | ICD-10-CM | POA: Diagnosis present

## 2015-02-04 DIAGNOSIS — R1013 Epigastric pain: Secondary | ICD-10-CM

## 2015-02-04 DIAGNOSIS — R109 Unspecified abdominal pain: Secondary | ICD-10-CM | POA: Insufficient documentation

## 2015-02-04 DIAGNOSIS — R1110 Vomiting, unspecified: Secondary | ICD-10-CM

## 2015-02-04 DIAGNOSIS — R1084 Generalized abdominal pain: Secondary | ICD-10-CM | POA: Diagnosis not present

## 2015-02-04 DIAGNOSIS — Z794 Long term (current) use of insulin: Secondary | ICD-10-CM | POA: Diagnosis not present

## 2015-02-04 DIAGNOSIS — Z7901 Long term (current) use of anticoagulants: Secondary | ICD-10-CM

## 2015-02-04 DIAGNOSIS — K529 Noninfective gastroenteritis and colitis, unspecified: Secondary | ICD-10-CM

## 2015-02-04 LAB — CBC WITH DIFFERENTIAL/PLATELET
BASOS ABS: 0 10*3/uL (ref 0.0–0.1)
Basophils Relative: 0 %
EOS ABS: 0 10*3/uL (ref 0.0–0.7)
EOS PCT: 0 %
HCT: 35.9 % — ABNORMAL LOW (ref 36.0–46.0)
HEMOGLOBIN: 12 g/dL (ref 12.0–15.0)
LYMPHS PCT: 19 %
Lymphs Abs: 1.3 10*3/uL (ref 0.7–4.0)
MCH: 28.8 pg (ref 26.0–34.0)
MCHC: 33.4 g/dL (ref 30.0–36.0)
MCV: 86.1 fL (ref 78.0–100.0)
Monocytes Absolute: 0.5 10*3/uL (ref 0.1–1.0)
Monocytes Relative: 7 %
NEUTROS PCT: 74 %
Neutro Abs: 5.2 10*3/uL (ref 1.7–7.7)
PLATELETS: 366 10*3/uL (ref 150–400)
RBC: 4.17 MIL/uL (ref 3.87–5.11)
RDW: 12.3 % (ref 11.5–15.5)
WBC: 7.1 10*3/uL (ref 4.0–10.5)

## 2015-02-04 LAB — BASIC METABOLIC PANEL
Anion gap: 12 (ref 5–15)
Anion gap: 12 (ref 5–15)
BUN: 17 mg/dL (ref 6–20)
BUN: 9 mg/dL (ref 6–20)
CALCIUM: 8.8 mg/dL — AB (ref 8.9–10.3)
CO2: 32 mmol/L (ref 22–32)
CO2: 27 mmol/L (ref 22–32)
CREATININE: 0.67 mg/dL (ref 0.44–1.00)
Calcium: 8.8 mg/dL — ABNORMAL LOW (ref 8.9–10.3)
Chloride: 94 mmol/L — ABNORMAL LOW (ref 101–111)
Chloride: 99 mmol/L — ABNORMAL LOW (ref 101–111)
Creatinine, Ser: 0.84 mg/dL (ref 0.44–1.00)
GFR calc non Af Amer: >60 mL/min (ref >60)
Glucose, Bld: 245 mg/dL — ABNORMAL HIGH (ref 65–99)
Glucose, Bld: 198 mg/dL — ABNORMAL HIGH (ref 65–99)
POTASSIUM: 2.5 mmol/L — AB (ref 3.5–5.1)
Potassium: 3.4 mmol/L — ABNORMAL LOW (ref 3.5–5.1)
SODIUM: 138 mmol/L (ref 135–145)
SODIUM: 138 mmol/L (ref 135–145)

## 2015-02-04 LAB — GLUCOSE, CAPILLARY
GLUCOSE-CAPILLARY: 155 mg/dL — AB (ref 65–99)
GLUCOSE-CAPILLARY: 178 mg/dL — AB (ref 65–99)
GLUCOSE-CAPILLARY: 238 mg/dL — AB (ref 65–99)
Glucose-Capillary: 151 mg/dL — ABNORMAL HIGH (ref 65–99)

## 2015-02-04 LAB — CBG MONITORING, ED: GLUCOSE-CAPILLARY: 182 mg/dL — AB (ref 65–99)

## 2015-02-04 LAB — TROPONIN I
Troponin I: 0.05 ng/mL — ABNORMAL HIGH (ref <0.031)
Troponin I: 0.04 ng/mL — ABNORMAL HIGH (ref <0.031)

## 2015-02-04 MED ORDER — POTASSIUM CHLORIDE 10 MEQ/100ML IV SOLN
10.0000 meq | INTRAVENOUS | Status: AC
  Administered 2015-02-04 (×3): 10 meq via INTRAVENOUS
  Filled 2015-02-04 (×3): qty 100

## 2015-02-04 MED ORDER — INSULIN ASPART 100 UNIT/ML ~~LOC~~ SOLN
0.0000 [IU] | SUBCUTANEOUS | Status: DC
  Administered 2015-02-04: 7 [IU] via SUBCUTANEOUS
  Administered 2015-02-04 – 2015-02-05 (×2): 4 [IU] via SUBCUTANEOUS
  Administered 2015-02-05: 7 [IU] via SUBCUTANEOUS
  Administered 2015-02-05: 4 [IU] via SUBCUTANEOUS
  Administered 2015-02-05 – 2015-02-06 (×2): 3 [IU] via SUBCUTANEOUS
  Administered 2015-02-06: 4 [IU] via SUBCUTANEOUS
  Administered 2015-02-06: 7 [IU] via SUBCUTANEOUS
  Administered 2015-02-06 – 2015-02-07 (×2): 4 [IU] via SUBCUTANEOUS
  Administered 2015-02-07: 3 [IU] via SUBCUTANEOUS

## 2015-02-04 MED ORDER — PANTOPRAZOLE SODIUM 40 MG PO TBEC
40.0000 mg | DELAYED_RELEASE_TABLET | Freq: Two times a day (BID) | ORAL | Status: DC
  Administered 2015-02-04 – 2015-02-09 (×10): 40 mg via ORAL
  Filled 2015-02-04 (×11): qty 1

## 2015-02-04 MED ORDER — LISINOPRIL 20 MG PO TABS
20.0000 mg | ORAL_TABLET | Freq: Every day | ORAL | Status: DC
  Administered 2015-02-05 – 2015-02-09 (×4): 20 mg via ORAL
  Filled 2015-02-04 (×6): qty 1

## 2015-02-04 MED ORDER — CLONIDINE HCL 0.1 MG PO TABS
0.1000 mg | ORAL_TABLET | Freq: Two times a day (BID) | ORAL | Status: DC
  Administered 2015-02-04 – 2015-02-09 (×6): 0.1 mg via ORAL
  Filled 2015-02-04 (×11): qty 1

## 2015-02-04 MED ORDER — HALOPERIDOL LACTATE 5 MG/ML IJ SOLN
5.0000 mg | Freq: Once | INTRAMUSCULAR | Status: AC
  Administered 2015-02-04: 5 mg via INTRAVENOUS
  Filled 2015-02-04: qty 1

## 2015-02-04 MED ORDER — INSULIN ASPART 100 UNIT/ML FLEXPEN: 6.0000 [IU] | PEN_INJECTOR | SUBCUTANEOUS | Status: DC

## 2015-02-04 MED ORDER — ATENOLOL 50 MG PO TABS: 100.0000 mg | ORAL_TABLET | Freq: Every evening | ORAL | Status: DC

## 2015-02-04 MED ORDER — ATENOLOL 25 MG PO TABS
12.5000 mg | ORAL_TABLET | Freq: Every day | ORAL | Status: DC
  Administered 2015-02-04: 12.5 mg via ORAL
  Filled 2015-02-04 (×2): qty 1

## 2015-02-04 MED ORDER — HYDRALAZINE HCL 20 MG/ML IJ SOLN
2.0000 mg | INTRAMUSCULAR | Status: DC | PRN
  Administered 2015-02-04: 2 mg via INTRAVENOUS
  Filled 2015-02-04: qty 1

## 2015-02-04 MED ORDER — ONDANSETRON HCL 4 MG/2ML IJ SOLN
4.0000 mg | Freq: Four times a day (QID) | INTRAMUSCULAR | Status: DC | PRN
  Administered 2015-02-04 – 2015-02-09 (×5): 4 mg via INTRAVENOUS
  Filled 2015-02-04 (×5): qty 2

## 2015-02-04 MED ORDER — ADULT MULTIVITAMIN W/MINERALS CH
1.0000 | ORAL_TABLET | Freq: Every evening | ORAL | Status: DC
  Administered 2015-02-04 – 2015-02-08 (×4): 1 via ORAL
  Filled 2015-02-04 (×4): qty 1

## 2015-02-04 MED ORDER — PROMETHAZINE HCL 25 MG RE SUPP: 25.0000 mg | Freq: Four times a day (QID) | RECTAL | Status: DC | PRN

## 2015-02-04 MED ORDER — ESCITALOPRAM OXALATE 10 MG PO TABS
10.0000 mg | ORAL_TABLET | Freq: Every day | ORAL | Status: DC
  Administered 2015-02-04 – 2015-02-09 (×6): 10 mg via ORAL
  Filled 2015-02-04 (×6): qty 1

## 2015-02-04 MED ORDER — ATORVASTATIN CALCIUM 80 MG PO TABS
80.0000 mg | ORAL_TABLET | Freq: Every day | ORAL | Status: DC
  Administered 2015-02-04 – 2015-02-09 (×6): 80 mg via ORAL
  Filled 2015-02-04 (×7): qty 1

## 2015-02-04 MED ORDER — POLYETHYLENE GLYCOL 3350 17 G PO PACK
17.0000 g | PACK | Freq: Every day | ORAL | Status: DC
  Administered 2015-02-04 – 2015-02-05 (×2): 17 g via ORAL
  Filled 2015-02-04 (×3): qty 1

## 2015-02-04 MED ORDER — SUCRALFATE 1 G PO TABS
1.0000 g | ORAL_TABLET | Freq: Three times a day (TID) | ORAL | Status: DC
  Administered 2015-02-04 – 2015-02-09 (×16): 1 g via ORAL
  Filled 2015-02-04 (×18): qty 1

## 2015-02-04 MED ORDER — POTASSIUM CHLORIDE CRYS ER 20 MEQ PO TBCR
20.0000 meq | EXTENDED_RELEASE_TABLET | Freq: Every day | ORAL | Status: DC | PRN
  Administered 2015-02-07: 20 meq via ORAL
  Filled 2015-02-04 (×2): qty 1

## 2015-02-04 MED ORDER — ALPRAZOLAM 0.5 MG PO TABS
0.5000 mg | ORAL_TABLET | Freq: Two times a day (BID) | ORAL | Status: DC | PRN
  Administered 2015-02-05 – 2015-02-09 (×4): 0.5 mg via ORAL
  Filled 2015-02-04 (×4): qty 1

## 2015-02-04 MED ORDER — SODIUM CHLORIDE 0.9 % IV SOLN
INTRAVENOUS | Status: AC
  Administered 2015-02-04 (×2): via INTRAVENOUS

## 2015-02-04 MED ORDER — LISINOPRIL-HYDROCHLOROTHIAZIDE 20-12.5 MG PO TABS: 1.0000 | ORAL_TABLET | Freq: Two times a day (BID) | ORAL | Status: DC

## 2015-02-04 MED ORDER — VITAMIN D (ERGOCALCIFEROL) 1.25 MG (50000 UNIT) PO CAPS: 50000.0000 [IU] | ORAL_CAPSULE | ORAL | Status: DC

## 2015-02-04 MED ORDER — ALBUTEROL SULFATE (2.5 MG/3ML) 0.083% IN NEBU: 2.5000 mg | INHALATION_SOLUTION | RESPIRATORY_TRACT | Status: DC | PRN

## 2015-02-04 MED ORDER — LORATADINE 10 MG PO TABS
10.0000 mg | ORAL_TABLET | Freq: Every day | ORAL | Status: DC
  Administered 2015-02-04 – 2015-02-09 (×6): 10 mg via ORAL
  Filled 2015-02-04 (×6): qty 1

## 2015-02-04 MED ORDER — ERYTHROMYCIN ETHYLSUCCINATE 200 MG/5ML PO SUSR
250.0000 mg | Freq: Three times a day (TID) | ORAL | Status: DC
  Administered 2015-02-04 – 2015-02-06 (×6): 250 mg via ORAL
  Filled 2015-02-04 (×13): qty 6.3

## 2015-02-04 MED ORDER — RIVAROXABAN 20 MG PO TABS
20.0000 mg | ORAL_TABLET | Freq: Every evening | ORAL | Status: DC
  Administered 2015-02-04 – 2015-02-08 (×5): 20 mg via ORAL
  Filled 2015-02-04 (×6): qty 1

## 2015-02-04 MED ORDER — ALBUTEROL SULFATE HFA 108 (90 BASE) MCG/ACT IN AERS: 2.0000 | INHALATION_SPRAY | RESPIRATORY_TRACT | Status: DC | PRN

## 2015-02-04 MED ORDER — OMEGA-3-ACID ETHYL ESTERS 1 G PO CAPS
1.0000 g | ORAL_CAPSULE | Freq: Every day | ORAL | Status: DC
  Administered 2015-02-04 – 2015-02-09 (×5): 1 g via ORAL
  Filled 2015-02-04 (×5): qty 1

## 2015-02-04 MED ORDER — HYDROCHLOROTHIAZIDE 12.5 MG PO CAPS
12.5000 mg | ORAL_CAPSULE | Freq: Every day | ORAL | Status: DC
Start: 2015-02-04 — End: 2015-02-05
  Filled 2015-02-04: qty 1

## 2015-02-04 MED ORDER — METHIMAZOLE 5 MG PO TABS
7.5000 mg | ORAL_TABLET | Freq: Every evening | ORAL | Status: DC
  Administered 2015-02-04 – 2015-02-08 (×4): 7.5 mg via ORAL
  Filled 2015-02-04 (×7): qty 2

## 2015-02-04 MED ORDER — DICYCLOMINE HCL 20 MG PO TABS
20.0000 mg | ORAL_TABLET | Freq: Two times a day (BID) | ORAL | Status: DC
  Administered 2015-02-04 – 2015-02-09 (×9): 20 mg via ORAL
  Filled 2015-02-04 (×11): qty 1

## 2015-02-04 MED ORDER — INSULIN GLARGINE 100 UNIT/ML ~~LOC~~ SOLN
25.0000 [IU] | Freq: Every evening | SUBCUTANEOUS | Status: DC
  Administered 2015-02-04 – 2015-02-08 (×5): 25 [IU] via SUBCUTANEOUS
  Filled 2015-02-04 (×9): qty 0.25

## 2015-02-04 MED ORDER — KETOTIFEN FUMARATE 0.025 % OP SOLN
2.0000 [drp] | Freq: Two times a day (BID) | OPHTHALMIC | Status: DC
  Administered 2015-02-04 – 2015-02-08 (×6): 2 [drp] via OPHTHALMIC
  Filled 2015-02-04 (×2): qty 5

## 2015-02-04 MED ORDER — SODIUM CHLORIDE 0.9 % IV BOLUS (SEPSIS)
1000.0000 mL | Freq: Once | INTRAVENOUS | Status: AC
  Administered 2015-02-04: 1000 mL via INTRAVENOUS

## 2015-02-04 MED ORDER — AMLODIPINE BESYLATE 5 MG PO TABS
5.0000 mg | ORAL_TABLET | Freq: Every day | ORAL | Status: DC
  Administered 2015-02-04 – 2015-02-06 (×3): 5 mg via ORAL
  Filled 2015-02-04 (×3): qty 1

## 2015-02-04 MED ORDER — INSULIN ASPART 100 UNIT/ML ~~LOC~~ SOLN: 0.0000 [IU] | SUBCUTANEOUS | Status: DC

## 2015-02-04 MED ORDER — ACETAMINOPHEN 325 MG PO TABS
650.0000 mg | ORAL_TABLET | Freq: Once | ORAL | Status: AC
  Administered 2015-02-04: 650 mg via ORAL
  Filled 2015-02-04 (×2): qty 2

## 2015-02-04 NOTE — ED Provider Notes (Signed)
CSN: [id number]     Arrival date & time 02/04/15  0445 History   First MD Initiated Contact with Patient 02/04/15 0457     Chief Complaint  Patient presents with  . Vomiting      (Consider location/radiation/quality/duration/timing/severity/associated sxs/prior Treatment) HPI  This is a 49 year old female with a history of diabetic gastroparesis. She is here with nausea and vomiting that began about 11 PM yesterday evening. This is her third visit in 5 days for the same. She is complaining of associated 9 out of 10 abdominal pain. She has not been able to control the vomiting at home and has not been able to sleep as a result. She denies diarrhea.  Past Medical History  Diagnosis Date  . Asthma   . Diabetes mellitus   . Hyperlipidemia   . Hypertension   . GERD (gastroesophageal reflux disease)   . Cerebellar tumor ([place])   . Obesity   . Sleep apnea   . A-fib ([place])   . Thyroid disease   . Chest pain 03/27/2014    negative lexiscan myoview   Past Surgical History  Procedure Laterality Date  . Cholecystectomy    . Partial hysterectomy      ovaries remain  . Nasal sinus surgery      Dr. Lyn Hollingshead  . Abdominal hysterectomy     Family History  Problem Relation Age of Onset  . Coronary artery disease Father   . Hypertension Father   . Diabetes      maternal grandparents  . Hyperlipidemia Other    Social History  Substance Use Topics  . Smoking status: Never Smoker   . Smokeless tobacco: Never Used  . Alcohol Use: No   OB History    No data available     Review of Systems  All other systems reviewed and are negative.   Allergies  Review of patient's allergies indicates no known allergies.  Home Medications   Prior to Admission medications   Medication Sig Start Date End Date Taking? Authorizing Provider  albuterol (PROVENTIL HFA;VENTOLIN HFA) 108 (90 BASE) MCG/ACT inhaler Inhale 2 puffs into the lungs every 4 (four) hours as needed for wheezing or shortness  of breath.    Historical Provider, MD  ALPRAZolam Duanne Moron) 0.5 MG tablet Take 1 tablet (0.5 mg total) by mouth 2 (two) times daily as needed for anxiety. 10/29/14   Midge Minium, MD  atenolol (TENORMIN) 100 MG tablet Take 1 tablet (100 mg total) by mouth every evening. 10/15/14   Midge Minium, MD  atenolol (TENORMIN) 25 MG tablet Take 1 tablet (25 mg total) by mouth daily as needed. 09/13/14   Sueanne Margarita, MD  atorvastatin (LIPITOR) 80 MG tablet Take 1 tablet (80 mg total) by mouth daily. 11/22/14   Sueanne Margarita, MD  azelastine (OPTIVAR) 0.05 % ophthalmic solution Place 1 drop into both eyes 2 (two) times daily. 10/15/14   Midge Minium, MD  beclomethasone (QVAR) 80 MCG/ACT inhaler Inhale 3 puffs into the lungs daily. 10/12/14   Colon Branch, MD  canagliflozin (INVOKANA) 300 MG TABS tablet Take 300 mg by mouth daily before breakfast.    Historical Provider, MD  cetirizine (ZYRTEC) 10 MG tablet Take 1 tablet (10 mg total) by mouth daily. 10/15/14   Midge Minium, MD  cloNIDine (CATAPRES) 0.1 MG tablet Take 1 tablet (0.1 mg total) by mouth 2 (two) times daily. 12/20/14   Midge Minium, MD  dicyclomine (BENTYL) 20 MG tablet  Take 1 tablet (20 mg total) by mouth 2 (two) times daily. 12/12/14   Dorie Rank, MD  escitalopram (LEXAPRO) 10 MG tablet Take 1 tablet (10 mg total) by mouth daily. 10/15/14   Midge Minium, MD  glipiZIDE (GLUCOTROL XL) 10 MG 24 hr tablet Take 10 mg by mouth daily with breakfast.    Historical Provider, MD  glucose blood test strip 1 each by Other route as needed. Onetouch ultra test strip     Historical Provider, MD  IRON PO Take 2 tablets by mouth 3 (three) times a week.     Historical Provider, MD  LANTUS SOLOSTAR 100 UNIT/ML Solostar Pen Inject 20 Units into the skin 4 (four) times daily -  before meals and at bedtime. AS DIRECTED 02/27/14   Historical Provider, MD  lisinopril-hydrochlorothiazide (PRINZIDE,ZESTORETIC) 20-12.5 MG per tablet Take 1 tablet by  mouth 2 (two) times daily. 11/26/14   Midge Minium, MD  metFORMIN (GLUCOPHAGE) 1000 MG tablet Take 1,000 mg by mouth 2 (two) times daily with a meal.    Historical Provider, MD  methimazole (TAPAZOLE) 5 MG tablet Take 7.5 mg by mouth every evening.  02/27/14   Historical Provider, MD  metoCLOPramide (REGLAN) 10 MG tablet Take 1 tablet (10 mg total) by mouth every 6 (six) hours as needed for nausea (nausea/headache). 12/11/14   Orpah Greek, MD  Multiple Vitamin (MULTIVITAMIN WITH MINERALS) TABS tablet Take 1 tablet by mouth every evening.     Historical Provider, MD  Omega-3 Fatty Acids (FISH OIL) 1000 MG CAPS Take 2,000 mg by mouth every evening.     Historical Provider, MD  ondansetron (ZOFRAN ODT) 4 MG disintegrating tablet Take 1 tablet (4 mg total) by mouth every 8 (eight) hours as needed for nausea or vomiting. 06/19/14   Pleas Koch, NP  pantoprazole (PROTONIX) 40 MG tablet Take 1 tablet (40 mg total) by mouth daily. Patient taking differently: Take 40 mg by mouth 2 (two) times daily.  06/07/14   Midge Minium, MD  potassium chloride SA (K-DUR,KLOR-CON) 20 MEQ tablet Take 20 mEq by mouth daily as needed (for leg cramping). Take one tablet by mouth daily. 02/26/12   Midge Minium, MD  promethazine (PHENERGAN) 25 MG suppository Place 1 suppository (25 mg total) rectally every 6 (six) hours as needed for nausea or vomiting. 12/13/14   Merryl Hacker, MD  sucralfate (CARAFATE) 1 G tablet Take 1 tablet (1 g total) by mouth 4 (four) times daily -  with meals and at bedtime. 12/20/14   Midge Minium, MD  Vitamin D, Ergocalciferol, (DRISDOL) 50000 UNITS CAPS capsule Take 50,000 Units by mouth every 7 (seven) days.    Historical Provider, MD   BP 160/104 mmHg  Pulse 123  Temp(Src) 98.8 F (37.1 C)  Resp 16  Ht 5\' 1"  (1.549 m)  Wt 160 lb (72.576 kg)  BMI 30.25 kg/m2  SpO2 100%   Physical Exam  General: Well-developed, well-nourished female in no acute distress;  appearance consistent with age of record HENT: normocephalic; atraumatic Eyes: pupils equal, round and reactive to light; extraocular muscles intact Neck: supple Heart: regular rate and rhythm; tachycardia; 3/6 systolic murmur right upper sternal border Lungs: clear to auscultation bilaterally Abdomen: soft; nondistended; diffuse tenderness; no masses or hepatosplenomegaly; bowel sounds hypoactive Extremities: No deformity; full range of motion; pulses normal Neurologic: Awake, alert and oriented; motor function intact in all extremities and symmetric; no facial droop Skin: Warm and dry Psychiatric: Flat  affect    ED Course  Procedures (including critical care time)   MDM  Nursing notes and vitals signs, including pulse oximetry, reviewed.  Summary of this visit's results, reviewed by myself:   EKG Interpretation  Date/Time:  Monday February 04 2015 06:19:36 EDT Ventricular Rate:  92 PR Interval:  128 QRS Duration: 94 QT Interval:  380 QTC Calculation: 469 R Axis:   -8 Text Interpretation:  Normal sinus rhythm Left ventricular hypertrophy ST \\T \ T wave abnormality, consider inferior ischemia ST \\T \ T wave abnormality, consider anterolateral ischemia Abnormal ECG ST/T changes not seen previously Confirmed by Skylur Fuston  MD, Jenny Reichmann ([id number]) on 02/04/2015 6:22:26 AM       Labs:  Results for orders placed or performed during the hospital encounter of 02/04/15 (from the past 24 hour(s))  Basic metabolic panel     Status: Abnormal   Collection Time: 02/04/15  5:35 AM  Result Value Ref Range   Sodium 138 135 - 145 mmol/L   Potassium 2.5 (LL) 3.5 - 5.1 mmol/L   Chloride 94 (L) 101 - 111 mmol/L   CO2 32 22 - 32 mmol/L   Glucose, Bld 245 (H) 65 - 99 mg/dL   BUN 17 6 - 20 mg/dL   Creatinine, Ser 0.84 0.44 - 1.00 mg/dL   Calcium 8.8 (L) 8.9 - 10.3 mg/dL   GFR calc non Af Amer >60 >60 mL/min   GFR calc Af Amer >60 >60 mL/min   Anion gap 12 5 - 15  CBC with Differential/Platelet      Status: Abnormal   Collection Time: 02/04/15  5:35 AM  Result Value Ref Range   WBC 7.1 4.0 - 10.5 K/uL   RBC 4.17 3.87 - 5.11 MIL/uL   Hemoglobin 12.0 12.0 - 15.0 g/dL   HCT 35.9 (L) 36.0 - 46.0 %   MCV 86.1 78.0 - 100.0 fL   MCH 28.8 26.0 - 34.0 pg   MCHC 33.4 30.0 - 36.0 g/dL   RDW 12.3 11.5 - 15.5 %   Platelets 366 150 - 400 K/uL   Neutrophils Relative % 74 %   Neutro Abs 5.2 1.7 - 7.7 K/uL   Lymphocytes Relative 19 %   Lymphs Abs 1.3 0.7 - 4.0 K/uL   Monocytes Relative 7 %   Monocytes Absolute 0.5 0.1 - 1.0 K/uL   Eosinophils Relative 0 %   Eosinophils Absolute 0.0 0.0 - 0.7 K/uL   Basophils Relative 0 %   Basophils Absolute 0.0 0.0 - 0.1 K/uL   6:08 AM Somnolent after IV Haldol. Potassium noted to be 2.5. Will supplement with IV potassium chloride.  7:09 AM Awaiting transport to [facility]. Accepted by Dr. Hal Hope.  Shanon Rosser, MD 02/04/15 [phone]

## 2015-02-04 NOTE — ED Notes (Signed)
Patient states that she is still throwing up and can not sleep because every time she falls asleep she has to throw up again.

## 2015-02-04 NOTE — ED Notes (Signed)
[facility] staff at bedside, pt is alert and cooperative. Pt care transferred to [facility], pt is aware of plan of care, impending inpatient admit to [facility].

## 2015-02-04 NOTE — H&P (Signed)
[facility] History and Physical  Crystal Peck GYI:[id number] DOB: 08/26/1965 DOA: 02/04/2015  Referring physician: ED PCP: [name], MD   Chief Complaint: Abdominal pain with nausea and vomiting  HPI:  Patient is a 49 year old African-American female with a past medical history significant for diabetes mellitus type 2 uncontrolled complicated by gastroparesis, hypertension, atrial fibrillation, and hyperthyroidism; who presents with abdominal pain with intractable nausea and vomiting. Patient has been seen in the ED on 10/15 and 10/13 with similar complaints. She reports that she woke up this morning throwing up approximately 8 times. Emesis was a yellow bile color. She reports that she try to eat yogurt, but it did not stay down. Patient has been tried on Reglan and reports that it used to work, but no longer is helping. Associated symptoms include constipation and weight loss. Last bowel movement was reported a few days ago. In the last 3 months, she reports losing approximately 10 pounds. First diagnosed in August 2016. She reports having a gastric emptying study. She reports that she was scheduled an appointment with Dr. [name] of gastroenterology next week on October  24th. Per patient report her diabetes is well controlled, however her last hemoglobin A1c was 9.3 two months ago.  Review of Systems: A complete review of systems was performed and negative except for as noted above in the history of present illness.     Past Medical History  Diagnosis Date  . Asthma   . Diabetes mellitus   . Hyperlipidemia   . Hypertension   . GERD (gastroesophageal reflux disease)   . Cerebellar tumor ([place])   . Obesity   . Sleep apnea   . A-fib ([place])   . Thyroid disease   . Chest pain 03/27/2014    negative lexiscan myoview     Past Surgical History  Procedure Laterality Date  . Cholecystectomy    . Partial hysterectomy      ovaries remain  . Nasal sinus surgery      Dr.  Lyn Hollingshead  . Abdominal hysterectomy        Social History:  reports that she has never smoked. She has never used smokeless tobacco. She reports that she does not drink alcohol or use illicit drugs. Where does patient live--home and with whom if at home? Can patient participate in ADLs?Y  No Known Allergies  Family History  Problem Relation Age of Onset  . Coronary artery disease Father   . Hypertension Father   . Diabetes      maternal grandparents  . Hyperlipidemia Other       FAMILY HISTORY  W  Prior to Admission medications   Medication Sig Start Date End Date Taking? Authorizing Provider  albuterol (PROVENTIL HFA;VENTOLIN HFA) 108 (90 BASE) MCG/ACT inhaler Inhale 2 puffs into the lungs every 4 (four) hours as needed for wheezing or shortness of breath.   Yes Historical Provider, MD  amLODipine (NORVASC) 5 MG tablet Take 5 mg by mouth daily.   Yes Historical Provider, MD  atenolol (TENORMIN) 100 MG tablet Take 1 tablet (100 mg total) by mouth every evening. 10/15/14  Yes Midge Minium, MD  atenolol (TENORMIN) 25 MG tablet Take 1 tablet (25 mg total) by mouth daily as needed. 09/13/14  Yes Sueanne Margarita, MD  atorvastatin (LIPITOR) 80 MG tablet Take 1 tablet (80 mg total) by mouth daily. 11/22/14  Yes Sueanne Margarita, MD  azelastine (OPTIVAR) 0.05 % ophthalmic solution Place 1 drop into both eyes 2 (two)  times daily. 10/15/14  Yes Midge Minium, MD  beclomethasone (QVAR) 80 MCG/ACT inhaler Inhale 3 puffs into the lungs daily. Patient taking differently: Inhale 3 puffs into the lungs daily as needed (wheezing).  10/12/14  Yes Colon Branch, MD  canagliflozin (INVOKANA) 300 MG TABS tablet Take 300 mg by mouth daily before breakfast.   Yes Historical Provider, MD  cetirizine (ZYRTEC) 10 MG tablet Take 1 tablet (10 mg total) by mouth daily. 10/15/14  Yes Midge Minium, MD  cloNIDine (CATAPRES) 0.1 MG tablet Take 1 tablet (0.1 mg total) by mouth 2 (two) times daily. 12/20/14   Yes Midge Minium, MD  dicyclomine (BENTYL) 20 MG tablet Take 1 tablet (20 mg total) by mouth 2 (two) times daily. 12/12/14  Yes Dorie Rank, MD  escitalopram (LEXAPRO) 10 MG tablet Take 1 tablet (10 mg total) by mouth daily. 10/15/14  Yes Midge Minium, MD  glipiZIDE (GLUCOTROL XL) 10 MG 24 hr tablet Take 10 mg by mouth daily with breakfast.   Yes Historical Provider, MD  glucose blood test strip 1 each by Other route as needed. Onetouch ultra test strip    Yes Historical Provider, MD  insulin aspart (NOVOLOG FLEXPEN) 100 UNIT/ML FlexPen Inject 6 Units into the skin See admin instructions. Per sliding scale 01/30/15  Yes Historical Provider, MD  insulin glargine (LANTUS) 100 UNIT/ML injection Inject 32 Units into the skin every evening.   Yes Historical Provider, MD  IRON PO Take 2 tablets by mouth 3 (three) times a week.    Yes Historical Provider, MD  LANTUS SOLOSTAR 100 UNIT/ML Solostar Pen Inject 20 Units into the skin 4 (four) times daily -  before meals and at bedtime. AS DIRECTED 02/27/14  Yes Historical Provider, MD  lisinopril-hydrochlorothiazide (PRINZIDE,ZESTORETIC) 20-12.5 MG per tablet Take 1 tablet by mouth 2 (two) times daily. 11/26/14  Yes Midge Minium, MD  metFORMIN (GLUCOPHAGE) 1000 MG tablet Take 1,000 mg by mouth 2 (two) times daily with a meal.   Yes Historical Provider, MD  methimazole (TAPAZOLE) 5 MG tablet Take 7.5 mg by mouth every evening.  02/27/14  Yes Historical Provider, MD  metoCLOPramide (REGLAN) 10 MG tablet Take 1 tablet (10 mg total) by mouth every 6 (six) hours as needed for nausea (nausea/headache). 12/11/14  Yes Orpah Greek, MD  Multiple Vitamin (MULTIVITAMIN WITH MINERALS) TABS tablet Take 1 tablet by mouth every evening.    Yes Historical Provider, MD  Omega-3 Fatty Acids (FISH OIL) 1000 MG CAPS Take 2,000 mg by mouth every evening.    Yes Historical Provider, MD  ondansetron (ZOFRAN ODT) 4 MG disintegrating tablet Take 1 tablet (4 mg total)  by mouth every 8 (eight) hours as needed for nausea or vomiting. 06/19/14  Yes Pleas Koch, NP  pantoprazole (PROTONIX) 40 MG tablet Take 1 tablet (40 mg total) by mouth daily. Patient taking differently: Take 40 mg by mouth 2 (two) times daily.  06/07/14  Yes Midge Minium, MD  polyethylene glycol (MIRALAX / GLYCOLAX) packet Take 17 g by mouth daily. 01/30/15  Yes Historical Provider, MD  potassium chloride SA (K-DUR,KLOR-CON) 20 MEQ tablet Take 20 mEq by mouth daily as needed (for leg cramping). Take one tablet by mouth daily. 02/26/12  Yes Midge Minium, MD  promethazine (PHENERGAN) 25 MG suppository Place 1 suppository (25 mg total) rectally every 6 (six) hours as needed for nausea or vomiting. 12/13/14  Yes Merryl Hacker, MD  rivaroxaban (XARELTO) 20 MG  TABS tablet Take 20 mg by mouth daily. 07/02/14  Yes Historical Provider, MD  sucralfate (CARAFATE) 1 G tablet Take 1 tablet (1 g total) by mouth 4 (four) times daily -  with meals and at bedtime. 12/20/14  Yes Midge Minium, MD  Vitamin D, Ergocalciferol, (DRISDOL) 50000 UNITS CAPS capsule Take 50,000 Units by mouth every 7 (seven) days.   Yes Historical Provider, MD  ALPRAZolam Duanne Moron) 0.5 MG tablet Take 1 tablet (0.5 mg total) by mouth 2 (two) times daily as needed for anxiety. Patient not taking: Reported on 02/04/2015 10/29/14   Midge Minium, MD     Physical Exam: Filed Vitals:   02/04/15 7793 02/04/15 0925 02/04/15 0947 02/04/15 1132  BP: 156/87 182/104  184/100  Pulse: 92 100    Temp:  98.2 F (36.8 C)    TempSrc:  Oral    Resp:  18    Height:   5\' 1"  (1.549 m)   Weight:   73.392 kg (161 lb 12.8 oz)   SpO2: 100% 100%       Constitutional: Vital signs reviewed. Patient is a ill-appearing but in no acute distress and cooperative with exam. Alert and oriented x3.  Head: Normocephalic and atraumatic  Ear: TM normal bilaterally  Mouth: no erythema or exudates, dry mucous membranes  Eyes: PERRL, EOMI,  conjunctivae normal, No scleral icterus.  Neck: Supple, Trachea midline normal ROM, No JVD, mass, thyromegaly, or carotid bruit present.  Cardiovascular: RRR, S1 normal, S2 normal, +SEM 2/6, pulses symmetric and intact bilaterally  Pulmonary/Chest: CTAB, no wheezes, rales, or rhonchi  Abdominal: Mildly distended with hypoactive bowel sounds tenderness to palpation, no guarding. GU: no CVA tenderness Musculoskeletal: No joint deformities, erythema, or stiffness, ROM full and no nontender Ext: no edema and no cyanosis, pulses palpable bilaterally (DP and PT)  Hematology: no cervical, inginal, or axillary adenopathy.  Neurological: A&O x3, Strenght is normal and symmetric bilaterally, cranial nerve II-XII are grossly intact, no focal motor deficit, sensory intact to light touch bilaterally.  Skin: Warm, dry and intact. Poor overall skin turgor. No rash, cyanosis, or clubbing.  Psychiatric: Normal mood and affect. speech and behavior is normal. Judgment and thought content normal. Cognition and memory are normal.      Data Review   Micro Results No results found for this or any previous visit (from the past 240 hour(s)).  Radiology Reports No results found.   CBC  Recent Labs Lab 01/31/15 1455 02/04/15 0535  WBC 7.1 7.1  HGB 11.9* 12.0  HCT 35.5* 35.9*  PLT 357 366  MCV 85.3 86.1  MCH 28.6 28.8  MCHC 33.5 33.4  RDW 12.0 12.3  LYMPHSABS 0.5* 1.3  MONOABS 0.2 0.5  EOSABS 0.0 0.0  BASOSABS 0.0 0.0    Chemistries   Recent Labs Lab 01/31/15 1455 02/04/15 0535  NA 137 138  K 3.2* 2.5*  CL 98* 94*  CO2 27 32  GLUCOSE 203* 245*  BUN 8 17  CREATININE 0.60 0.84  CALCIUM 9.1 8.8*  AST 25  --   ALT 40  --   ALKPHOS 110  --   BILITOT 1.0  --    ------------------------------------------------------------------------------------------------------------------ estimated creatinine clearance is 73.4 mL/min (by C-G formula based on Cr of  0.84). ------------------------------------------------------------------------------------------------------------------ No results for input(s): HGBA1C in the last 72 hours. ------------------------------------------------------------------------------------------------------------------ No results for input(s): CHOL, HDL, LDLCALC, TRIG, CHOLHDL, LDLDIRECT in the last 72 hours. ------------------------------------------------------------------------------------------------------------------ No results for input(s): TSH, T4TOTAL, T3FREE, THYROIDAB in the last  72 hours.  Invalid input(s): FREET3 ------------------------------------------------------------------------------------------------------------------ No results for input(s): VITAMINB12, FOLATE, FERRITIN, TIBC, IRON, RETICCTPCT in the last 72 hours.  Coagulation profile No results for input(s): INR, PROTIME in the last 168 hours.  No results for input(s): DDIMER in the last 72 hours.  Cardiac Enzymes No results for input(s): CKMB, TROPONINI, MYOGLOBIN in the last 168 hours.  Invalid input(s): CK ------------------------------------------------------------------------------------------------------------------ Invalid input(s): POCBNP   CBG:  Recent Labs Lab 02/04/15 0805 02/04/15 1141  GLUCAP 182* 178*       EKG: Independently reviewed. Normal sinus rhythm Left ventricular hypertrophy ST & T wave abnormality, consider inferior ischemia ST & T wave abnormality, consider anterolateral ischemia Abnormal ECG ST/T changes not seen previously   Assessment/Plan Principal Problem:    Diabetic gastroparesis (HCC) acute on chronic unchanged. Likely secondary to uncontrolled diabetes mellitus type 2. -trial of erythromycin 250 mg by mouth every 8 hours   -Clear liquid diet advance as tolerated -Patient to follow-up with Dr. [name] for appointment October 24th  Intractable nausea and vomiting: Recurrent issue -Zofran  prn - IVF NS @150ml /hr  Abdominal pain: Suspect patient is constipated will rule out the possibility of a bowel obstruction - Checking a KUB  Diabetes mellitus type 2 uncontrolled last hemoglobin A1c was 9.3 back and July  -Held called oral agents  -Continued Lantus 25 units with a moderate sliding scale insulin every 4 hours for now will need to be changed every before meals   Essential hypertension uncontrolled. Blood pressure 182/104   -prn hydralazine for systolic blood pressures greater than 867 diastolic blood pressures greater than 120 -Restarting home blood pressure medications  PAF (paroxysmal atrial fibrillation) ([place]) on anticoagulation therapy. EKG currently showing signs of sinus rhythm. Continue on chronic anticoagulation therapy. -Continue Xarelto patient appears to be rate -Atenolol written for 12.5 mg daily home dose appears be 25 mg when necessary whenever patient feels like she has a arrhythmia  Abnormal EKG -Repeat EKG in a.m. trending cardiac enzymes    Graves' disease -Continue home medication of methimazole    Hypokalemia: acute on chronic. Secondary to continued nausea vomiting. On admission patient's potassium was noted to be 2.5 replacement orders written - We'll continue home KCl 20 mEq daily - Recheck BMP REPLACEMENT given  GERD: Chronic -Continue Protonix   Code Status:   full Family Communication: bedside Disposition Plan: admit   Total time spent 55 minutes.Greater than 50% of this time was spent in counseling, explanation of diagnosis, planning of further management, and coordination of care  [facility] Pager [phone]  If 7PM-7AM, please contact night-coverage [email or web address] Password [email or web address] 02/04/2015, 12:18 PM

## 2015-02-05 LAB — GLUCOSE, CAPILLARY
GLUCOSE-CAPILLARY: 149 mg/dL — AB (ref 65–99)
Glucose-Capillary: 104 mg/dL — ABNORMAL HIGH (ref 65–99)
Glucose-Capillary: 212 mg/dL — ABNORMAL HIGH (ref 65–99)
Glucose-Capillary: 147 mg/dL — ABNORMAL HIGH (ref 65–99)
Glucose-Capillary: 152 mg/dL — ABNORMAL HIGH (ref 65–99)

## 2015-02-05 LAB — COMPREHENSIVE METABOLIC PANEL
ALBUMIN: 3.2 g/dL — AB (ref 3.5–5.0)
ALT: 29 U/L (ref 14–54)
ANION GAP: 7 (ref 5–15)
AST: 23 U/L (ref 15–41)
Alkaline Phosphatase: 84 U/L (ref 38–126)
BILIRUBIN TOTAL: 1.2 mg/dL (ref 0.3–1.2)
BUN: 7 mg/dL (ref 6–20)
CO2: 29 mmol/L (ref 22–32)
Calcium: 9.2 mg/dL (ref 8.9–10.3)
Chloride: 103 mmol/L (ref 101–111)
Creatinine, Ser: 0.84 mg/dL (ref 0.44–1.00)
GFR calc Af Amer: >60 mL/min (ref >60)
GFR calc non Af Amer: >60 mL/min (ref >60)
GLUCOSE: 152 mg/dL — AB (ref 65–99)
POTASSIUM: 3.6 mmol/L (ref 3.5–5.1)
SODIUM: 139 mmol/L (ref 135–145)
TOTAL PROTEIN: 5.8 g/dL — AB (ref 6.5–8.1)

## 2015-02-05 LAB — BASIC METABOLIC PANEL
Anion gap: 11 (ref 5–15)
BUN: 9 mg/dL (ref 6–20)
CALCIUM: 8 mg/dL — AB (ref 8.9–10.3)
CO2: 28 mmol/L (ref 22–32)
CREATININE: 0.98 mg/dL (ref 0.44–1.00)
Chloride: 96 mmol/L — ABNORMAL LOW (ref 101–111)
GFR calc non Af Amer: >60 mL/min (ref >60)
Glucose, Bld: 179 mg/dL — ABNORMAL HIGH (ref 65–99)
Potassium: 2.7 mmol/L — CL (ref 3.5–5.1)
SODIUM: 135 mmol/L (ref 135–145)

## 2015-02-05 LAB — HEMOGLOBIN A1C
HEMOGLOBIN A1C: 8.6 % — AB (ref 4.8–5.6)
Mean Plasma Glucose: 200 mg/dL

## 2015-02-05 LAB — CALCIUM, IONIZED: CALCIUM, IONIZED, SERUM: 5 mg/dL (ref 4.5–5.6)

## 2015-02-05 LAB — MAGNESIUM: Magnesium: 1.6 mg/dL — ABNORMAL LOW (ref 1.7–2.4)

## 2015-02-05 LAB — TROPONIN I: TROPONIN I: 0.03 ng/mL (ref <0.031)

## 2015-02-05 MED ORDER — POTASSIUM CHLORIDE CRYS ER 20 MEQ PO TBCR
40.0000 meq | EXTENDED_RELEASE_TABLET | ORAL | Status: AC
  Administered 2015-02-05 – 2015-02-06 (×6): 40 meq via ORAL
  Filled 2015-02-05 (×6): qty 2

## 2015-02-05 MED ORDER — BOOST / RESOURCE BREEZE PO LIQD
1.0000 | Freq: Three times a day (TID) | ORAL | Status: DC
  Administered 2015-02-06: 1 via ORAL

## 2015-02-05 MED ORDER — POTASSIUM CHLORIDE CRYS ER 20 MEQ PO TBCR
30.0000 meq | EXTENDED_RELEASE_TABLET | ORAL | Status: AC
  Administered 2015-02-05 (×2): 30 meq via ORAL
  Filled 2015-02-05: qty 2

## 2015-02-05 MED ORDER — ATENOLOL 25 MG PO TABS
25.0000 mg | ORAL_TABLET | Freq: Every day | ORAL | Status: DC
  Administered 2015-02-05 – 2015-02-06 (×2): 25 mg via ORAL
  Filled 2015-02-05 (×2): qty 1

## 2015-02-05 MED ORDER — MAGNESIUM SULFATE 2 GM/50ML IV SOLN
2.0000 g | Freq: Once | INTRAVENOUS | Status: AC
  Administered 2015-02-05: 2 g via INTRAVENOUS
  Filled 2015-02-05: qty 50

## 2015-02-05 MED ORDER — SODIUM CHLORIDE 0.9 % IV SOLN
INTRAVENOUS | Status: DC
  Administered 2015-02-05 – 2015-02-06 (×2): via INTRAVENOUS
  Filled 2015-02-05 (×4): qty 1000

## 2015-02-05 NOTE — Progress Notes (Signed)
[facility] was notified of pt hypotension and that Clonidine 0.1 mg was scheduled to give at 2200. Pt was asymptomatic and medication owas order to hold if systolic bp less than 817. Arthor Captain LPN

## 2015-02-05 NOTE — Progress Notes (Signed)
Utilization review completed. Guliana Weyandt, RN, BSN. 

## 2015-02-05 NOTE — Progress Notes (Addendum)
[facility] PROGRESS NOTE  Crystal Peck YSA:[id number] DOB: 29-Apr-1965 DOA: 02/04/2015 PCP: [name], MD  Length of stay: 1   Assessment/Plan: Principal Problem:   Diabetic gastroparesis ([place]) Active Problems:   Essential hypertension   PAF (paroxysmal atrial fibrillation) (HCC)   Graves' disease   Hypokalemia   Intractable nausea and vomiting   Nonspecific abnormal electrocardiogram (ECG) (EKG)   GERD (gastroesophageal reflux disease)    Diabetic gastroparesis ([place]), exacerbated in the setting of hypokalemia  acute on chronic unchanged. Likely secondary to uncontrolled diabetes mellitus type 2. Most recent hemoglobin A1c was 8.6 on 02/04/15 -trial of erythromycin 250 mg by mouth every 8 hours  -Clear liquid diet advance as tolerated -Patient to follow-up with general surgery  Crystal Jubilee, MD on 10/24 Crystal Comfort, MD  on 10/26 at [facility]  Intractable nausea and vomiting: CT scan abdomen and pelvis with contrast on 01/28/15 at [facility] showed malrotation of the abdomen. Patient has several loops of bowel to the anterior abdominal wall suggestive of adhesions. No bowel obstruction. Normal appendix. 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 EKG currently showing signs of sinus rhythm. Continue on chronic anticoagulation therapy. -Continue Xarelto . Increase  atenolol to 25 mg a day, rate uncontrolled  Abnormal EKG -Repeat EKG in a.m. trending cardiac enzymes   Graves' disease -Continue home medication of methimazole   Hypokalemia: acute on chronic. Secondary to continued nausea vomiting. On admission patient's potassium was noted to be 2.5 replacement orders written Replaced aggressively and recheck this afternoon  GERD: Chronic -Continue Protonix    DVT prophylaxsis   Code Status:   Family Communication: family updated about patient's clinical progress Disposition Plan:  Anticipate discharge in 2-3 days   Brief narrative: 49 year old African-American female with a past medical history significant for diabetes mellitus type 2 uncontrolled complicated by gastroparesis, hypertension, atrial fibrillation, and hyperthyroidism; who presents with abdominal pain with intractable nausea and vomiting. Patient has been seen in the ED on 10/15 and 10/13 with similar complaints. She reports that she woke up this morning throwing up approximately 8 times. Emesis was a yellow bile color. She reports that she try to eat yogurt, but it did not stay down. Patient has been tried on Reglan and reports that it used to work, but no longer is helping. Associated symptoms include constipation and weight loss. Last bowel movement was reported a few days ago. In the last 3 months, she reports losing approximately 10 pounds. First diagnosed in August 2016. She reports having a gastric emptying study. She reports that she was scheduled an appointment with Dr. [name] of gastroenterology next week on October 24th. Per patient report her diabetes is well controlled, however her last hemoglobin A1c was 9.3 two months ago  Consultants:  Gastroenterology  Procedures:  None  Antibiotics: Anti-infectives    Start     Dose/Rate Route Frequency Ordered Stop   02/04/15 1400  erythromycin ethylsuccinate (EES) 200  MG/5ML suspension 250 mg     250 mg Oral 3 times per  day 02/04/15 1222           HPI/Subjective: Patient continues to endorse epigastric pain and is still nauseous  Objective: Filed Vitals:   02/04/15 2030 02/04/15 2247 02/05/15 0520 02/05/15 0757  BP: 95/40 90/60 150/68 145/60  Pulse: 78  89 90  Temp: 98.7 F (37.1 C)  98.2 F (36.8 C) 97.8 F (36.6 C)  TempSrc: Oral  Oral Oral  Resp: 16  18 18   Height: 5\' 1"  (1.549 m)     Weight: 72.031 kg (158 lb 12.8 oz)     SpO2: 97%  98% 98%   No intake or output data in the 24 hours ending 02/05/15 1209  Exam:  General: No acute respiratory distress Lungs: Clear to auscultation bilaterally without wheezes or crackles Cardiovascular: Regular rate and rhythm without murmur gallop or rub normal S1 and S2 Abdomen: Nontender, nondistended, soft, bowel sounds positive, no rebound, no ascites, no appreciable mass Extremities: No significant cyanosis, clubbing, or edema bilateral lower extremities     Data Review   Micro Results No results found for this or any previous visit (from the past 240 hour(s)).  Radiology Reports Dg Abd 1 View  02/04/2015  CLINICAL DATA:  Left-sided abdominal pain for 2 months. Constipation. EXAM: ABDOMEN - 1 VIEW COMPARISON:  January 14, 2015. FINDINGS: The bowel gas pattern is normal. Phleboliths are noted in the pelvis and left paraspinal region. No definite renal calculi are noted. No significant mound of stool is noted. IMPRESSION: No evidence of bowel obstruction or ileus. Electronically Signed   By: Marijo Conception, M.D.   On: 02/04/2015 14:00     CBC  Recent Labs Lab 01/31/15 1455 02/04/15 0535  WBC 7.1 7.1  HGB 11.9* 12.0  HCT 35.5* 35.9*  PLT 357 366  MCV 85.3 86.1  MCH 28.6 28.8  MCHC 33.5 33.4  RDW 12.0 12.3  LYMPHSABS 0.5* 1.3  MONOABS 0.2 0.5  EOSABS 0.0 0.0  BASOSABS 0.0 0.0    Chemistries   Recent Labs Lab 01/31/15 1455 02/04/15 0535 02/04/15 1204 02/04/15 2342  NA 137 138 138 135  K 3.2* 2.5* 3.4* 2.7*  CL 98*  94* 99* 96*  CO2 27 32 27 28  GLUCOSE 203* 245* 198* 179*  BUN 8 17 9 9   CREATININE 0.60 0.84 0.67 0.98  CALCIUM 9.1 8.8* 8.8* 8.0*  AST 25  --   --   --   ALT 40  --   --   --   ALKPHOS 110  --   --   --   BILITOT 1.0  --   --   --    ------------------------------------------------------------------------------------------------------------------ estimated creatinine clearance is 62.3 mL/min (by C-G formula based on Cr of 0.98). ------------------------------------------------------------------------------------------------------------------  Recent Labs  02/04/15 1246  HGBA1C 8.6*   ------------------------------------------------------------------------------------------------------------------ No results for input(s): CHOL, HDL, LDLCALC, TRIG, CHOLHDL, LDLDIRECT in the last 72 hours. ------------------------------------------------------------------------------------------------------------------ No results for input(s): TSH, T4TOTAL, T3FREE, THYROIDAB in the last 72 hours.  Invalid input(s): FREET3 ------------------------------------------------------------------------------------------------------------------ No results for input(s): VITAMINB12, FOLATE, FERRITIN, TIBC, IRON, RETICCTPCT in the last 72 hours.  Coagulation profile No results for input(s): INR, PROTIME in the last 168 hours.  No results for input(s): DDIMER in the last 72 hours.  Cardiac Enzymes  Recent Labs Lab 02/04/15 1246 02/04/15 1842 02/04/15 2342  TROPONINI 0.05* 0.04* 0.03   ------------------------------------------------------------------------------------------------------------------ Invalid input(s): POCBNP   CBG:  Recent Labs Lab 02/04/15 2030 02/04/15 2358 02/05/15 0353 02/05/15 0745 02/05/15 1138  GLUCAP 238* 155* 104* 212* 147*       Studies: Dg Abd 1 View  02/04/2015  CLINICAL DATA:  Left-sided abdominal pain for 2 months. Constipation. EXAM: ABDOMEN - 1 VIEW  COMPARISON:  January 14, 2015. FINDINGS: The bowel gas pattern is normal. Phleboliths are noted in the pelvis and left paraspinal region. No definite renal calculi are noted. No significant mound of stool is noted. IMPRESSION: No evidence of bowel obstruction or ileus. Electronically Signed   By: Marijo Conception, M.D.   On: 02/04/2015 14:00      Lab Results  Component Value Date   HGBA1C 8.6* 02/04/2015   HGBA1C 9.3* 11/13/2014   HGBA1C 8.9* 10/26/2014   Lab Results  Component Value Date   LDLCALC 95 07/02/2014   CREATININE 0.98 02/04/2015       Scheduled Meds: . amLODipine  5 mg Oral Daily  . atenolol  12.5 mg Oral Daily  . atorvastatin  80 mg Oral Daily  . cloNIDine  0.1 mg Oral BID  . dicyclomine  20 mg Oral BID  . erythromycin ethylsuccinate  250 mg Oral 3 times per day  . escitalopram  10 mg Oral Daily  . insulin aspart  0-20 Units Subcutaneous 6 times per day  . insulin glargine  25 Units Subcutaneous QPM  . ketotifen  2 drop Both Eyes BID  . lisinopril  20 mg Oral Daily  . loratadine  10 mg Oral Daily  . magnesium sulfate 1 - 4 g bolus IVPB  2 g Intravenous Once  . methimazole  7.5 mg Oral QPM  . multivitamin with minerals  1 tablet Oral QPM  . omega-3 acid ethyl esters  1 g Oral Daily  . pantoprazole  40 mg Oral BID  . polyethylene glycol  17 g Oral Daily  . potassium chloride  40 mEq Oral Q4H  . rivaroxaban  20 mg Oral QPM  . sucralfate  1 g Oral TID WC & HS  . [START ON 02/11/2015] Vitamin D (Ergocalciferol)  50,000 Units Oral Q7 days   Continuous Infusions: . 0.9 % sodium chloride with kcl 75 mL/hr at 02/05/15 1144    Principal Problem:   Diabetic gastroparesis (HCC) Active Problems:   Essential hypertension   PAF (paroxysmal atrial fibrillation) (HCC)   Graves' disease   Hypokalemia   Intractable nausea and vomiting   Nonspecific abnormal electrocardiogram (ECG) (EKG)   GERD (gastroesophageal reflux disease)    Time spent: 45  minutes   [place] [facility] [facility] Pager [phone]. If 7PM-7AM, please contact night-coverage at [email or web address], password [facility] 02/05/2015, 12:09 PM  LOS: 1 day

## 2015-02-05 NOTE — Progress Notes (Signed)
CRITICAL VALUE ALERT  Critical value received:  K2.7  Date of notification:  10/18  Time of notification:  *0040  Critical value read back:yes  Nurse who received alert:  Arthor Captain LPN  MD notified (1st page): [facility]  Time of first page:  0040  MD notified (2nd page):  Time of second page:  Responding MD:  Hospitalist   Time MD responded:  [phone]

## 2015-02-05 NOTE — Progress Notes (Signed)
Initial Nutrition Assessment  DOCUMENTATION CODES:   Obesity unspecified  INTERVENTION:   Provide Boost Breeze po TID, each supplement provides 250 kcal and 9 grams of protein.  Encourage adequate PO intake.   NUTRITION DIAGNOSIS:   Inadequate oral intake related to poor appetite, nausea, vomiting as evidenced by per patient/family report.  GOAL:   Patient will meet greater than or equal to 90% of their needs  MONITOR:   PO intake, Supplement acceptance, Diet advancement, Weight trends, Labs, I & O's  REASON FOR ASSESSMENT:   Malnutrition Screening Tool    ASSESSMENT:   49 year old African-American female with a past medical history significant for diabetes mellitus type 2 uncontrolled complicated by gastroparesis, hypertension, atrial fibrillation, and hyperthyroidism; who presents with abdominal pain with intractable nausea and vomiting.  Pt reports her abdomen in sore. No meal completion recorded, however pt reports able to consume her meal this morning. PTA pt reports having poor po intake and only able to take bites out of food at meals. Pt unable to quantify the amount of days she has been having poor po during time of visit. Per Epic weight records, pt with a 10.2% weight loss in 1 month. Pt is agreeable to Boost Breezee to aid in caloric and protein needs. RD to order. Pt was encouraged to eat her food at meals.   Pt with no observed significant fat or muscle mass loss.   Labs and medications reviewed.   Diet Order:  Diet clear liquid Room service appropriate?: Yes; Fluid consistency:: Thin  Skin:  Reviewed, no issues  Last BM:  10/16  Height:   Ht Readings from Last 1 Encounters:  02/04/15 5\' 1"  (1.549 m)    Weight:   Wt Readings from Last 1 Encounters:  02/04/15 158 lb 12.8 oz (72.031 kg)    Ideal Body Weight:  47.7 kg  BMI:  Body mass index is 30.02 kg/(m^2).  Estimated Nutritional Needs:   Kcal:  1700-1900  Protein:  85-95 grams  Fluid:   1.7 - 1.9 L/day  EDUCATION NEEDS:   No education needs identified at this time  Corrin Parker, MS, RD, LDN Pager # [phone] After hours/ weekend pager # [phone]

## 2015-02-05 NOTE — Progress Notes (Addendum)
Inpatient Diabetes Program Recommendations  AACE/ADA: New Consensus Statement on Inpatient Glycemic Control (2015)  Target Ranges:  Prepandial:   less than 140 mg/dL      Peak postprandial:   less than 180 mg/dL (1-2 hours)      Critically ill patients:  140 - 180 mg/dL   Review of Glycemic Control  Diabetes history: DM 2 Outpatient Diabetes medications: Metformin 1,000 mg BID, Invokana 300 mg Daily, Glipizide 10 mg Daily, Novolog 6 units TID meal coverage, Lantus 32 units QHS Current orders for Inpatient glycemic control: Lantus 25 units, Novolog Resistant Q4  Inpatient Diabetes Program Recommendations: Insulin - Basal: Fasting glucose above 200 this am. Patient takes 32 units of basal at home. Please consider increasing basal insulin to Lantus 28-30 units QHS.   Thanks,  Tama Headings RN, MSN, [facility] Inpatient Diabetes Coordinator Team Pager [phone] (8a-5p)

## 2015-02-06 DIAGNOSIS — I10 Essential (primary) hypertension: Secondary | ICD-10-CM

## 2015-02-06 DIAGNOSIS — R1084 Generalized abdominal pain: Secondary | ICD-10-CM

## 2015-02-06 DIAGNOSIS — E0500 Thyrotoxicosis with diffuse goiter without thyrotoxic crisis or storm: Secondary | ICD-10-CM

## 2015-02-06 DIAGNOSIS — R109 Unspecified abdominal pain: Secondary | ICD-10-CM | POA: Insufficient documentation

## 2015-02-06 LAB — CBC
HEMATOCRIT: 29.5 % — AB (ref 36.0–46.0)
HEMOGLOBIN: 9.3 g/dL — AB (ref 12.0–15.0)
MCH: 27.9 pg (ref 26.0–34.0)
MCHC: 31.5 g/dL (ref 30.0–36.0)
MCV: 88.6 fL (ref 78.0–100.0)
Platelets: 300 10*3/uL (ref 150–400)
RBC: 3.33 MIL/uL — AB (ref 3.87–5.11)
RDW: 13.7 % (ref 11.5–15.5)
WBC: 6.1 10*3/uL (ref 4.0–10.5)

## 2015-02-06 LAB — GLUCOSE, CAPILLARY
GLUCOSE-CAPILLARY: 107 mg/dL — AB (ref 65–99)
GLUCOSE-CAPILLARY: 134 mg/dL — AB (ref 65–99)
GLUCOSE-CAPILLARY: 192 mg/dL — AB (ref 65–99)
GLUCOSE-CAPILLARY: 91 mg/dL (ref 65–99)
Glucose-Capillary: 230 mg/dL — ABNORMAL HIGH (ref 65–99)
Glucose-Capillary: 185 mg/dL — ABNORMAL HIGH (ref 65–99)

## 2015-02-06 LAB — COMPREHENSIVE METABOLIC PANEL
ALBUMIN: 3.1 g/dL — AB (ref 3.5–5.0)
ALT: 42 U/L (ref 14–54)
ANION GAP: 4 — AB (ref 5–15)
AST: 50 U/L — ABNORMAL HIGH (ref 15–41)
Alkaline Phosphatase: 88 U/L (ref 38–126)
BILIRUBIN TOTAL: 0.7 mg/dL (ref 0.3–1.2)
BUN: 10 mg/dL (ref 6–20)
CHLORIDE: 107 mmol/L (ref 101–111)
CO2: 25 mmol/L (ref 22–32)
Calcium: 9.1 mg/dL (ref 8.9–10.3)
Creatinine, Ser: 0.93 mg/dL (ref 0.44–1.00)
GFR calc Af Amer: >60 mL/min (ref >60)
GFR calc non Af Amer: >60 mL/min (ref >60)
GLUCOSE: 205 mg/dL — AB (ref 65–99)
POTASSIUM: 5.9 mmol/L — AB (ref 3.5–5.1)
SODIUM: 136 mmol/L (ref 135–145)
TOTAL PROTEIN: 5.9 g/dL — AB (ref 6.5–8.1)

## 2015-02-06 LAB — LACTIC ACID, PLASMA
LACTIC ACID, VENOUS: 1.2 mmol/L (ref 0.5–2.0)
LACTIC ACID, VENOUS: 1.2 mmol/L (ref 0.5–2.0)

## 2015-02-06 MED ORDER — OXYCODONE HCL 5 MG PO TABS
5.0000 mg | ORAL_TABLET | ORAL | Status: DC | PRN
  Administered 2015-02-06 – 2015-02-08 (×2): 5 mg via ORAL
  Filled 2015-02-06 (×3): qty 1

## 2015-02-06 MED ORDER — SODIUM CHLORIDE 0.9 % IV SOLN
INTRAVENOUS | Status: DC
  Administered 2015-02-06 – 2015-02-08 (×4): via INTRAVENOUS

## 2015-02-06 MED ORDER — GLUCERNA SHAKE PO LIQD: 237.0000 mL | Freq: Two times a day (BID) | ORAL | Status: DC

## 2015-02-06 MED ORDER — MORPHINE SULFATE (PF) 2 MG/ML IV SOLN
1.0000 mg | Freq: Once | INTRAVENOUS | Status: AC
  Administered 2015-02-06: 1 mg via INTRAVENOUS
  Filled 2015-02-06: qty 1

## 2015-02-06 MED ORDER — METOCLOPRAMIDE HCL 5 MG/ML IJ SOLN
10.0000 mg | Freq: Once | INTRAMUSCULAR | Status: AC
Start: 2015-02-06 — End: 2015-02-06
  Administered 2015-02-06: 10 mg via INTRAVENOUS
  Filled 2015-02-06: qty 2

## 2015-02-06 MED ORDER — SENNOSIDES-DOCUSATE SODIUM 8.6-50 MG PO TABS
1.0000 | ORAL_TABLET | Freq: Two times a day (BID) | ORAL | Status: DC
  Administered 2015-02-06 – 2015-02-09 (×6): 1 via ORAL
  Filled 2015-02-06 (×7): qty 1

## 2015-02-06 NOTE — Progress Notes (Signed)
Patient vomiting.  Notified Dr. Allyson Sabal.  New order for Lactic acid and NPO received.

## 2015-02-06 NOTE — Progress Notes (Signed)
[facility] PROGRESS NOTE  Crystal Peck VZD:[id number] DOB: 06/03/1965 DOA: 02/04/2015 PCP: [name], MD  Length of stay: 2   Assessment/Plan: Principal Problem:   Diabetic gastroparesis ([place]) Active Problems:   Essential hypertension   PAF (paroxysmal atrial fibrillation) (H[facility])   Graves' disease   Hypokalemia   Intractable nausea and vomiting   Nonspecific abnormal electrocardiogram (ECG) (EKG)   GERD (gastroesophageal reflux disease)   Abdominal pain    Diabetic gastroparesis ([place]), exacerbated in the setting of hypokalemia  acute on chronic unchanged. Likely secondary to uncontrolled diabetes mellitus type 2. Most recent hemoglobin A1c was 8.6 on 02/04/15 Cont  erythromycin 250 mg by mouth every 8 hours  Tolerating soft diet -Patient to follow-up with general surgery  [name], MD on 10/24 [name], MD  on 10/26 at [facility] Try to minimise narcotics  Intractable nausea and vomiting: CT scan abdomen and pelvis with contrast on 01/28/15 at [facility] showed malrotation of the abdomen. Patient has several loops of bowel to the anterior abdominal wall suggestive of adhesions. No bowel obstruction. Normal appendix. 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 EKG currently showing signs of sinus rhythm.  Continue on chronic anticoagulation therapy. -Continue Xarelto . Increase atenolol to 25 mg a day, rate uncontrolled  Abnormal EKG -Repeat EKG in a.m. trending cardiac enzymes   Graves' disease -Continue home medication of methimazole   Hypokalemia: acute on chronic. Secondary to continued nausea vomiting. On admission patient's potassium was noted to be 2.5 replacement orders written Replaced aggressively and recheck this afternoon  GERD: Chronic -Continue Protonix    DVT prophylaxsis   Code Status:   Family Communication: family updated about patient's clinical progress Disposition Plan:  Anticipate discharge 10/20   Brief narrative: 49 year old African-American female with a past medical history significant for diabetes mellitus type 2 uncontrolled complicated by gastroparesis, hypertension, atrial fibrillation, and hyperthyroidism; who presents with abdominal pain with intractable nausea and vomiting. Patient has been seen in the ED on 10/15 and 10/13 with similar complaints. She reports that she woke up this morning throwing up approximately 8 times. Emesis was a yellow bile color. She reports that she try to eat yogurt, but it did not stay down. Patient has been tried on Reglan and reports that it used to work, but no longer is helping. Associated symptoms include constipation and weight loss. Last bowel movement was reported a few days ago. In the last 3 months, she reports losing approximately 10 pounds. First diagnosed in August 2016. She reports having a gastric emptying study. She reports that she was scheduled an appointment with Dr. [name] of gastroenterology next week on October 24th. Per patient report her diabetes is well controlled, however her last hemoglobin A1c was 9.3 two months ago  Consultants:  Gastroenterology  Procedures:  None  Antibiotics: Anti-infectives    Start  Dose/Rate Route Frequency Ordered Stop   02/04/15 1400  erythromycin  ethylsuccinate (EES) 200 MG/5ML suspension 250 mg     250 mg Oral 3 times per day 02/04/15 1222           HPI/Subjective: Tolerating soft diet but constipated, cramping in her epigastric region   Objective: Filed Vitals:   02/05/15 2017 02/06/15 0434 02/06/15 0835 02/06/15 1010  BP: 92/46 109/58 134/69 123/56  Pulse: 63 66 69 66  Temp: 98.3 F (36.8 C) 97.4 F (36.3 C) 98.2 F (36.8 C)   TempSrc: Oral Oral Oral   Resp: 16 16 17    Height:      Weight: 71.641 kg (157 lb 15 oz)     SpO2: 98% 100% 100%     Intake/Output Summary (Last 24 hours) at 02/06/15 1518 Last data filed at 02/06/15 1321  Gross per 24 hour  Intake   1975 ml  Output    500 ml  Net   1475 ml    Exam:  General: No acute respiratory distress Lungs: Clear to auscultation bilaterally without wheezes or crackles Cardiovascular: Regular rate and rhythm without murmur gallop or rub normal S1 and S2 Abdomen: Nontender, nondistended, soft, bowel sounds positive, no rebound, no ascites, no appreciable mass Extremities: No significant cyanosis, clubbing, or edema bilateral lower extremities     Data Review   Micro Results No results found for this or any previous visit (from the past 240 hour(s)).  Radiology Reports Dg Abd 1 View  02/04/2015  CLINICAL DATA:  Left-sided abdominal pain for 2 months. Constipation. EXAM: ABDOMEN - 1 VIEW COMPARISON:  January 14, 2015. FINDINGS: The bowel gas pattern is normal. Phleboliths are noted in the pelvis and left paraspinal region. No definite renal calculi are noted. No significant mound of stool is noted. IMPRESSION: No evidence of bowel obstruction or ileus. Electronically Signed   By: Marijo Conception, M.D.   On: 02/04/2015 14:00     CBC  Recent Labs Lab 01/31/15 1455 02/04/15 0535 02/06/15 0429  WBC 7.1 7.1 6.1  HGB 11.9* 12.0 9.3*  HCT 35.5* 35.9* 29.5*  PLT 357 366 300  MCV 85.3 86.1 88.6  MCH 28.6 28.8 27.9  MCHC 33.5 33.4 31.5  RDW 12.0 12.3  13.7  LYMPHSABS 0.5* 1.3  --   MONOABS 0.2 0.5  --   EOSABS 0.0 0.0  --   BASOSABS 0.0 0.0  --     Chemistries   Recent Labs Lab 01/31/15 1455 02/04/15 0535 02/04/15 1204 02/04/15 2342 02/05/15 1150 02/05/15 1545 02/06/15 1144  NA 137 138 138 135  --  139 136  K 3.2* 2.5* 3.4* 2.7*  --  3.6 5.9*  CL 98* 94* 99* 96*  --  103 107  CO2 27 32 27 28  --  29 25  GLUCOSE 203* 245* 198* 179*  --  152* 205*  BUN 8 17 9 9   --  7 10  CREATININE 0.60 0.84 0.67 0.98  --  0.84 0.93  CALCIUM 9.1 8.8* 8.8* 8.0*  --  9.2 9.1  MG  --   --   --   --  1.6*  --   --   AST 25  --   --   --   --  23 50*  ALT 40  --   --   --   --  29 42  ALKPHOS 110  --   --   --   --  84 88  BILITOT 1.0  --   --   --   --  1.2 0.7   ------------------------------------------------------------------------------------------------------------------ estimated creatinine clearance is 65.5 mL/min (by C-G formula based on Cr of 0.93). ------------------------------------------------------------------------------------------------------------------  Recent Labs  02/04/15 1246  HGBA1C 8.6*   ------------------------------------------------------------------------------------------------------------------ No results for input(s): CHOL, HDL, LDLCALC, TRIG, CHOLHDL, LDLDIRECT in the last 72 hours. ------------------------------------------------------------------------------------------------------------------ No results for input(s): TSH, T4TOTAL, T3FREE, THYROIDAB in the last 72 hours.  Invalid input(s): FREET3 ------------------------------------------------------------------------------------------------------------------ No results for input(s): VITAMINB12, FOLATE, FERRITIN, TIBC, IRON, RETICCTPCT in the last 72 hours.  Coagulation profile No results for input(s): INR, PROTIME in the last 168 hours.  No results for input(s): DDIMER in the last 72 hours.  Cardiac Enzymes  Recent Labs Lab 02/04/15 1246  02/04/15 1842 02/04/15 2342  TROPONINI 0.05* 0.04* 0.03   ------------------------------------------------------------------------------------------------------------------ Invalid input(s): POCBNP   CBG:  Recent Labs Lab 02/05/15 2015 02/06/15 0030 02/06/15 0433 02/06/15 0749 02/06/15 1129  GLUCAP 149* 134* 91 107* 230*       Studies: No results found.    Lab Results  Component Value Date   HGBA1C 8.6* 02/04/2015   HGBA1C 9.3* 11/13/2014   HGBA1C 8.9* 10/26/2014   Lab Results  Component Value Date   LDLCALC 95 07/02/2014   CREATININE 0.93 02/06/2015       Scheduled Meds: . amLODipine  5 mg Oral Daily  . atenolol  25 mg Oral Daily  . atorvastatin  80 mg Oral Daily  . cloNIDine  0.1 mg Oral BID  . dicyclomine  20 mg Oral BID  . erythromycin ethylsuccinate  250 mg Oral 3 times per day  . escitalopram  10 mg Oral Daily  . feeding supplement  1 Container Oral TID BM  . insulin aspart  0-20 Units Subcutaneous 6 times per day  . insulin glargine  25 Units Subcutaneous QPM  . ketotifen  2 drop Both Eyes BID  . lisinopril  20 mg Oral Daily  . loratadine  10 mg Oral Daily  . methimazole  7.5 mg Oral QPM  . multivitamin with minerals  1 tablet Oral QPM  . omega-3 acid ethyl esters  1 g Oral Daily  . pantoprazole  40 mg Oral BID  . polyethylene glycol  17 g Oral Daily  . rivaroxaban  20 mg Oral QPM  . senna-docusate  1 tablet Oral BID  . sucralfate  1 g Oral TID WC & HS  . [START ON 02/11/2015] Vitamin D (Ergocalciferol)  50,000 Units Oral Q7 days   Continuous Infusions: . sodium chloride 75 mL/hr at 02/06/15 1344    Principal Problem:   Diabetic gastroparesis ([facility]) Active Problems:   Essential hypertension   PAF (paroxysmal atrial fibrillation) ([facility])   Graves' disease   Hypokalemia   Intractable nausea and vomiting   Nonspecific abnormal electrocardiogram (ECG) (EKG)   GERD (gastroesophageal reflux disease)   Abdominal pain    Time spent: 45  minutes   [facility] Pager [phone]. If 7PM-7AM, please contact night-coverage at [email or web address], password [facility] 02/06/2015, 3:18 PM  LOS: 2 days

## 2015-02-06 NOTE — Progress Notes (Addendum)
Patient vomiting.  Will give IV zofran.  Dr. Allyson Sabal notified.

## 2015-02-06 NOTE — Progress Notes (Signed)
Nutrition Follow-up  DOCUMENTATION CODES:   Obesity unspecified  INTERVENTION:  Discontinue Boost Breeze.   Provide Glucerna Shake po BID, each supplement provides 220 kcal and 10 grams of protein.  Encourage adequate PO intake.   NUTRITION DIAGNOSIS:   Inadequate oral intake related to poor appetite, nausea, vomiting as evidenced by per patient/family report; ongoing  GOAL:   Patient will meet greater than or equal to 90% of their needs; not met  MONITOR:   PO intake, Supplement acceptance, Diet advancement, Weight trends, Labs, I & O's  REASON FOR ASSESSMENT:   Malnutrition Screening Tool    ASSESSMENT:   49 year old African-American female with a past medical history significant for diabetes mellitus type 2 uncontrolled complicated by gastroparesis, hypertension, atrial fibrillation, and hyperthyroidism; who presents with abdominal pain with intractable nausea and vomiting.  Diet has been advanced to a soft diet. Meal completion has been 50%. Pt has been refusing most of her Boost Breeze. RD to order Glucerna Shake instead to aid in caloric and protein needs. Encourage adequate PO intake.   Labs: High potassium and AST.  Diet Order:  DIET SOFT Room service appropriate?: Yes; Fluid consistency:: Thin  Skin:  Reviewed, no issues  Last BM:  10/16  Height:   Ht Readings from Last 1 Encounters:  02/04/15 '5\' 1"'  (1.549 m)    Weight:   Wt Readings from Last 1 Encounters:  02/05/15 157 lb 15 oz (71.641 kg)    Ideal Body Weight:  47.7 kg  BMI:  Body mass index is 29.86 kg/(m^2).  Estimated Nutritional Needs:   Kcal:  1700-1900  Protein:  85-95 grams  Fluid:  1.7 - 1.9 L/day  EDUCATION NEEDS:   No education needs identified at this time  Corrin Parker, MS, RD, LDN Pager # [phone] After hours/ weekend pager # [phone]

## 2015-02-07 LAB — MAGNESIUM: MAGNESIUM: 1.4 mg/dL — AB (ref 1.7–2.4)

## 2015-02-07 LAB — GLUCOSE, CAPILLARY
GLUCOSE-CAPILLARY: 124 mg/dL — AB (ref 65–99)
GLUCOSE-CAPILLARY: 146 mg/dL — AB (ref 65–99)
GLUCOSE-CAPILLARY: 66 mg/dL (ref 65–99)
Glucose-Capillary: 88 mg/dL (ref 65–99)
Glucose-Capillary: 109 mg/dL — ABNORMAL HIGH (ref 65–99)
Glucose-Capillary: 191 mg/dL — ABNORMAL HIGH (ref 65–99)
Glucose-Capillary: 189 mg/dL — ABNORMAL HIGH (ref 65–99)

## 2015-02-07 MED ORDER — ATENOLOL 25 MG PO TABS
12.5000 mg | ORAL_TABLET | Freq: Every day | ORAL | Status: DC
  Administered 2015-02-08 – 2015-02-09 (×2): 12.5 mg via ORAL
  Filled 2015-02-07 (×3): qty 1

## 2015-02-07 MED ORDER — IOHEXOL 300 MG/ML  SOLN: 25.0000 mL | INTRAMUSCULAR | Status: AC

## 2015-02-07 MED ORDER — INSULIN ASPART 100 UNIT/ML ~~LOC~~ SOLN
0.0000 [IU] | Freq: Three times a day (TID) | SUBCUTANEOUS | Status: DC
  Administered 2015-02-08: 5 [IU] via SUBCUTANEOUS
  Administered 2015-02-08 – 2015-02-09 (×2): 2 [IU] via SUBCUTANEOUS

## 2015-02-07 MED ORDER — INSULIN ASPART 100 UNIT/ML ~~LOC~~ SOLN: 0.0000 [IU] | Freq: Every day | SUBCUTANEOUS | Status: DC

## 2015-02-07 MED ORDER — DEXTROSE 50 % IV SOLN
INTRAVENOUS | Status: AC
  Administered 2015-02-07: 25 mL
  Filled 2015-02-07: qty 50

## 2015-02-07 MED ORDER — METOCLOPRAMIDE HCL 5 MG PO TABS
5.0000 mg | ORAL_TABLET | Freq: Three times a day (TID) | ORAL | Status: DC
  Administered 2015-02-07 – 2015-02-09 (×6): 5 mg via ORAL
  Filled 2015-02-07 (×6): qty 1

## 2015-02-07 MED ORDER — SODIUM CHLORIDE 0.9 % IV BOLUS (SEPSIS)
500.0000 mL | Freq: Once | INTRAVENOUS | Status: AC
  Administered 2015-02-07: 500 mL via INTRAVENOUS

## 2015-02-07 NOTE — Progress Notes (Signed)
pts cbg 66,pt npo.  Awake and alert but somewhat somnolent.  bp 107/50.  Admin 24ml d50.  Will recheck cbg in 15 min and cont to monitor.

## 2015-02-07 NOTE — Progress Notes (Signed)
Pt states she is not going to have CT scan and would like to eat.  Will notify MD.

## 2015-02-07 NOTE — Progress Notes (Addendum)
[facility] PROGRESS NOTE  BRANDON SCARBROUGH AYT:[id number] DOB: 1965-08-21 DOA: 02/04/2015 PCP: [name], MD  Length of stay: 3   Assessment/Plan: Principal Problem:   Diabetic gastroparesis ([place]) Active Problems:   Essential hypertension   PAF (paroxysmal atrial fibrillation) (HCC)   Graves' disease   Hypokalemia   Intractable nausea and vomiting   Nonspecific abnormal electrocardiogram (ECG) (EKG)   GERD (gastroesophageal reflux disease)   Abdominal pain   Brief narrative: 49 year old African-American female with a past medical history significant for diabetes mellitus type 2 uncontrolled complicated by gastroparesis, hypertension, atrial fibrillation, and hyperthyroidism; who presents with abdominal pain with intractable nausea and vomiting. Patient has been seen in the ED on 10/15 and 10/13 with similar complaints. She reports that she woke up this morning throwing up approximately 8 times. Emesis was a yellow bile color. She reports that she try to eat yogurt, but it did not stay down. Patient has been tried on Reglan and reports that it used to work, but no longer is helping. Associated symptoms include constipation and weight loss. Last bowel movement was reported a few days ago. In the last 3 months, she reports losing approximately 10 pounds.   First diagnosed in August 2016. She reports having a gastric emptying study. She reports that she was scheduled an appointment with Dr. [name] of gastroenterology next week on October 24th. Per patient report her diabetes is well controlled, however her last hemoglobin A1c was 9.3 two months ago Patient initially started on erythromycin and started developing worsening nausea. Erythromycin was discontinued. Patient currently improving on IV Phenergan/Reglan Anticipate discharge tomorrow, patient needs follow-up with [facility], appointments have already been set up   Assessment and plan Diabetic gastroparesis ([place]),  exacerbated in the setting of hypokalemia  acute on chronic unchanged. Likely secondary to uncontrolled diabetes mellitus type 2. Most recent hemoglobin A1c was 8.6 on 02/04/15 Discontinued erythromycin 250 mg by mouth every 8 hours given worsening nausea Started on by mouth Reglan 5 mg 3 times a day Patient tolerating soft diet -Patient to follow-up with general surgery  [name], MD on 10/24 [name], MD  on 10/26 at [facility]   Intractable nausea and vomiting: CT scan abdomen and pelvis with contrast on 01/28/15 at [facility] showed malrotation of the abdomen. Patient has several loops of bowel to the anterior abdominal wall suggestive of adhesions. No bowel obstruction. Normal appendix. 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 EKG currently showing signs of sinus rhythm. Continue on chronic anticoagulation therapy. -Continue Xarelto . Patient on atenolol  Abnormal EKG -Repeat EKG in a.m. trending cardiac enzymes   Graves' disease -Continue home medication of methimazole   Hypokalemia: acute on chronic.  Secondary to continued nausea  vomiting. On admission patient's potassium was noted to be 2.5 replacement orders written This has been replaced   GERD: Chronic -Continue Protonix    DVT prophylaxsis   Code Status:   Family Communication: family updated about patient's clinical progress Disposition Plan:  Anticipate discharge 10/21      Consultants:  Gastroenterology  Procedures:  None  Antibiotics: Anti-infectives    Start     Dose/Rate Route Frequency Ordered Stop   02/04/15 1400  erythromycin ethylsuccinate (EES) 200 MG/5ML suspension 250 mg  Status:  Discontinued     250 mg Oral 3 times per day 02/04/15 1222 02/06/15 1729         HPI/Subjective: Tolerating soft diet but constipated, cramping in her epigastric region is improving Would like to cancel her CT scan and see if the patient is able to tolerate her diet today  Objective: Filed Vitals:   02/06/15 1550 02/06/15 2043 02/07/15 0426 02/07/15 0542  BP: 161/94 187/79 87/48 80/40   Pulse: 87 108 70   Temp: 98.2 F (36.8 C) 98.2 F (36.8 C) 97.7 F (36.5 C)   TempSrc: Oral Oral Oral   Resp: 17 20 18 18   Height:      Weight:      SpO2: 100% 100% 100%     Intake/Output Summary (Last 24 hours) at 02/07/15 1117 Last data filed at 02/06/15 2300  Gross per 24 hour  Intake    935 ml  Output    300 ml  Net    635 ml    Exam:  General: No acute respiratory distress Lungs: Clear to auscultation bilaterally without wheezes or crackles Cardiovascular: Regular rate and rhythm without murmur gallop or rub normal S1 and S2 Abdomen: Nontender, nondistended, soft, bowel sounds positive, no rebound, no ascites, no appreciable mass Extremities: No significant cyanosis, clubbing, or edema bilateral lower extremities     Data Review   Micro Results No results found for this or any previous visit (from the past 240 hour(s)).  Radiology Reports Dg Abd 1 View  02/04/2015  CLINICAL DATA:  Left-sided abdominal pain for 2 months.  Constipation. EXAM: ABDOMEN - 1 VIEW COMPARISON:  January 14, 2015. FINDINGS: The bowel gas pattern is normal. Phleboliths are noted in the pelvis and left paraspinal region. No definite renal calculi are noted. No significant mound of stool is noted. IMPRESSION: No evidence of bowel obstruction or ileus. Electronically Signed   By: Marijo Conception, M.D.   On: 02/04/2015 14:00     CBC  Recent Labs Lab 01/31/15 1455 02/04/15 0535 02/06/15 0429  WBC 7.1 7.1 6.1  HGB 11.9* 12.0 9.3*  HCT 35.5* 35.9* 29.5*  PLT 357 366 300  MCV 85.3 86.1 88.6  MCH 28.6 28.8 27.9  MCHC 33.5 33.4 31.5  RDW 12.0 12.3 13.7  LYMPHSABS 0.5* 1.3  --   MONOABS 0.2 0.5  --   EOSABS 0.0 0.0  --   BASOSABS 0.0 0.0  --     Chemistries   Recent Labs Lab 01/31/15 1455 02/04/15 0535 02/04/15 1204 02/04/15 2342 02/05/15 1150 02/05/15 1545 02/06/15 1144  NA 137 138 138 135  --  139 136  K 3.2* 2.5* 3.4* 2.7*  --  3.6 5.9*  CL 98* 94* 99* 96*  --  103 107  CO2 27 32 27 28  --  29 25  GLUCOSE 203* 245* 198* 179*  --  152* 205*  BUN 8 17 9 9   --  7 10  CREATININE 0.60 0.84 0.67 0.98  --  0.84 0.93  CALCIUM 9.1 8.8* 8.8* 8.0*  --  9.2 9.1  MG  --   --   --   --  1.6*  --   --   AST 25  --   --   --   --  23 50*  ALT 40  --   --   --   --  29 42  ALKPHOS 110  --   --   --   --  84 88  BILITOT 1.0  --   --   --   --  1.2 0.7   ------------------------------------------------------------------------------------------------------------------ estimated creatinine clearance is 65.5 mL/min (by C-G formula based on Cr of 0.93). ------------------------------------------------------------------------------------------------------------------  Recent Labs  02/04/15 1246  HGBA1C 8.6*   ------------------------------------------------------------------------------------------------------------------ No results for input(s): CHOL, HDL, LDLCALC, TRIG, CHOLHDL, LDLDIRECT in the last 72  hours. ------------------------------------------------------------------------------------------------------------------ No results for input(s): TSH, T4TOTAL, T3FREE, THYROIDAB in the last 72 hours.  Invalid input(s): FREET3 ------------------------------------------------------------------------------------------------------------------ No results for input(s): VITAMINB12, FOLATE, FERRITIN, TIBC, IRON, RETICCTPCT in the last 72 hours.  Coagulation profile No results for input(s): INR, PROTIME in the last 168 hours.  No results for input(s): DDIMER in the last 72 hours.  Cardiac Enzymes  Recent Labs Lab 02/04/15 1246 02/04/15 1842 02/04/15 2342  TROPONINI 0.05* 0.04* 0.03   ------------------------------------------------------------------------------------------------------------------ Invalid input(s): POCBNP   CBG:  Recent Labs Lab 02/06/15 2040 02/07/15 0014 02/07/15 0424 02/07/15 0755 02/07/15 0907  GLUCAP 192* 146* 88 66 124*       Studies: No results found.    Lab Results  Component Value Date   HGBA1C 8.6* 02/04/2015   HGBA1C 9.3* 11/13/2014   HGBA1C 8.9* 10/26/2014   Lab Results  Component Value Date   LDLCALC 95 07/02/2014   CREATININE 0.93 02/06/2015       Scheduled Meds: . [START ON 02/08/2015] atenolol  12.5 mg Oral Daily  . atorvastatin  80 mg Oral Daily  . cloNIDine  0.1 mg Oral BID  . dicyclomine  20 mg Oral BID  . escitalopram  10 mg Oral Daily  . feeding supplement (GLUCERNA SHAKE)  237 mL Oral BID BM  . insulin aspart  0-20 Units Subcutaneous 6 times per day  . insulin glargine  25 Units Subcutaneous QPM  . ketotifen  2 drop Both Eyes BID  . lisinopril  20 mg Oral Daily  . loratadine  10 mg Oral Daily  . methimazole  7.5 mg Oral QPM  . multivitamin with minerals  1 tablet Oral QPM  . omega-3 acid ethyl esters  1 g Oral Daily  . pantoprazole  40 mg Oral BID  . polyethylene glycol  17 g Oral Daily  . rivaroxaban  20 mg  Oral QPM  . senna-docusate  1 tablet Oral BID  . sucralfate  1 g Oral TID WC & HS  . [START ON 02/11/2015] Vitamin D (Ergocalciferol)  50,000 Units Oral Q7 days   Continuous Infusions: . sodium chloride 125 mL/hr at 02/07/15 4098    Principal Problem:   Diabetic gastroparesis (HCC) Active Problems:   Essential hypertension   PAF (paroxysmal atrial fibrillation) (HCC)   Graves' disease   Hypokalemia   Intractable nausea and vomiting   Nonspecific abnormal electrocardiogram (ECG) (EKG)   GERD (gastroesophageal reflux disease)   Abdominal pain    Time spent: 45 minutes   [facility] Pager [phone]. If 7PM-7AM, please contact night-coverage at  [email or web address], password [facility] 02/07/2015, 11:17 AM  LOS: 3 days

## 2015-02-08 ENCOUNTER — Inpatient Hospital Stay (HOSPITAL_COMMUNITY): Payer: Medicaid Other

## 2015-02-08 DIAGNOSIS — K3184 Gastroparesis: Secondary | ICD-10-CM

## 2015-02-08 DIAGNOSIS — E1143 Type 2 diabetes mellitus with diabetic autonomic (poly)neuropathy: Principal | ICD-10-CM

## 2015-02-08 LAB — COMPREHENSIVE METABOLIC PANEL
ALBUMIN: 3.4 g/dL — AB (ref 3.5–5.0)
ALK PHOS: 105 U/L (ref 38–126)
ALT: 38 U/L (ref 14–54)
AST: 28 U/L (ref 15–41)
Anion gap: 11 (ref 5–15)
BILIRUBIN TOTAL: 0.9 mg/dL (ref 0.3–1.2)
BUN: 7 mg/dL (ref 6–20)
CO2: 28 mmol/L (ref 22–32)
CREATININE: 1.01 mg/dL — AB (ref 0.44–1.00)
Calcium: 9.8 mg/dL (ref 8.9–10.3)
Chloride: 103 mmol/L (ref 101–111)
GFR calc Af Amer: >60 mL/min (ref >60)
GLUCOSE: 173 mg/dL — AB (ref 65–99)
Potassium: 3.7 mmol/L (ref 3.5–5.1)
Sodium: 142 mmol/L (ref 135–145)
TOTAL PROTEIN: 6.7 g/dL (ref 6.5–8.1)

## 2015-02-08 LAB — CBC
HEMATOCRIT: 34.5 % — AB (ref 36.0–46.0)
Hemoglobin: 10.8 g/dL — ABNORMAL LOW (ref 12.0–15.0)
MCH: 27.7 pg (ref 26.0–34.0)
MCHC: 31.3 g/dL (ref 30.0–36.0)
MCV: 88.5 fL (ref 78.0–100.0)
PLATELETS: 370 10*3/uL (ref 150–400)
RBC: 3.9 MIL/uL (ref 3.87–5.11)
RDW: 13.3 % (ref 11.5–15.5)
WBC: 6.7 10*3/uL (ref 4.0–10.5)

## 2015-02-08 LAB — GLUCOSE, CAPILLARY
GLUCOSE-CAPILLARY: 86 mg/dL (ref 65–99)
GLUCOSE-CAPILLARY: 99 mg/dL (ref 65–99)
Glucose-Capillary: 245 mg/dL — ABNORMAL HIGH (ref 65–99)
Glucose-Capillary: 126 mg/dL — ABNORMAL HIGH (ref 65–99)

## 2015-02-08 LAB — MRSA PCR SCREENING: MRSA BY PCR: NEGATIVE

## 2015-02-08 MED ORDER — SODIUM CHLORIDE 0.9 % IV SOLN
500.0000 mg | Freq: Three times a day (TID) | INTRAVENOUS | Status: DC
  Administered 2015-02-08 – 2015-02-09 (×3): 500 mg via INTRAVENOUS
  Filled 2015-02-08 (×5): qty 10

## 2015-02-08 MED ORDER — ZOLPIDEM TARTRATE 5 MG PO TABS
5.0000 mg | ORAL_TABLET | Freq: Once | ORAL | Status: AC
  Administered 2015-02-08: 5 mg via ORAL
  Filled 2015-02-08: qty 1

## 2015-02-08 MED ORDER — OXYCODONE HCL 5 MG PO TABS
5.0000 mg | ORAL_TABLET | ORAL | Status: AC
  Administered 2015-02-08: 5 mg via ORAL
  Filled 2015-02-08: qty 1

## 2015-02-08 MED ORDER — POLYETHYLENE GLYCOL 3350 17 G PO PACK
17.0000 g | PACK | Freq: Two times a day (BID) | ORAL | Status: DC
  Administered 2015-02-08 – 2015-02-09 (×2): 17 g via ORAL
  Filled 2015-02-08 (×3): qty 1

## 2015-02-08 MED ORDER — BISACODYL 5 MG PO TBEC
10.0000 mg | DELAYED_RELEASE_TABLET | Freq: Every day | ORAL | Status: DC
  Administered 2015-02-08 – 2015-02-09 (×2): 10 mg via ORAL
  Filled 2015-02-08 (×2): qty 2

## 2015-02-08 NOTE — Progress Notes (Signed)
[facility] PROGRESS NOTE  Crystal Peck ZOX:[id number] DOB: Sep 05, 1965 DOA: 02/04/2015 PCP: [name], MD  Length of stay: 4   Assessment/Plan: Principal Problem:   Diabetic gastroparesis ([place]) Active Problems:   Essential hypertension   PAF (paroxysmal atrial fibrillation) (HCC)   Graves' disease   Hypokalemia   Intractable nausea and vomiting   Nonspecific abnormal electrocardiogram (ECG) (EKG)   GERD (gastroesophageal reflux disease)   Abdominal pain   Brief narrative: 49 year old African-American female with a past medical history significant for diabetes mellitus type 2 uncontrolled complicated by gastroparesis, hypertension, atrial fibrillation, and hyperthyroidism; who presents with abdominal pain with intractable nausea and vomiting. Patient has been seen in the ED on 10/15 and 10/13 with similar complaints. She reports that she woke up this morning throwing up approximately 8 times. Emesis was a yellow bile color. She reports that she try to eat yogurt, but it did not stay down. Patient has been tried on Reglan and reports that it used to work, but no longer is helping. Associated symptoms include constipation and weight loss. Last bowel movement was reported a few days ago. In the last 3 months, she reports losing approximately 10 pounds.   First diagnosed in August 2016. She reports having a gastric emptying study. She reports that she was scheduled an appointment with Dr. [name] of gastroenterology next week on October 24th. Per patient report her diabetes is well controlled, however her last hemoglobin A1c was 9.3 two months ago Patient initially started on erythromycin and started developing worsening nausea. Erythromycin was discontinued. Patient currently improving on IV Phenergan/Reglan Anticipate discharge tomorrow, patient needs follow-up with [facility], appointments have already been set up   Assessment and plan  Diabetic gastroparesis ([place]),  exacerbated in the setting of hypokalemia  acute on chronic unchanged. Likely secondary to uncontrolled diabetes mellitus type 2. Most recent hemoglobin A1c was 8.6 on 02/04/15 Started on by mouth Reglan 5 mg 3 times a day, will add IV erythromycin And initially tolerated diet but nausea again this morning, does not want to see GI or general surgery here. Has physicians in [place] and wants to follow with them. Initial KUB was stable will repeat on 02/08/2015. Also place on bowel regimen due to constipation and monitor. Counseled to stop narcotics.   -Patient to follow-up with general surgery  [name], MD on 10/24 [name], MD  on 10/26 at [facility]     Intractable nausea and vomiting: CT scan abdomen and pelvis with contrast on 01/28/15 at [facility] showed malrotation of the abdomen. Patient has several loops of bowel to the anterior abdominal wall suggestive of adhesions. No bowel obstruction. Normal appendix. Repeat KUB today. Patient declined repeat CT scan, surgical consultation, would like to follow-up at [facility] if her symptoms continue to improve. Down exam is benign.    Diabetes mellitus type 2 uncontrolled last hemoglobin A1c 8.6 -Held called oral agents  Continue Lantus 25 units with a moderate sliding scale insulin every 4 hours for now will need to be changed every before meals   CBG (last 3)   Recent Labs  02/07/15 1704 02/07/15 2105 02/08/15 0748  GLUCAP 191* 189* 245*     Essential hypertension stable  -prn hydralazine for systolic blood pressures greater than 811 diastolic blood pressures greater than 120 Restart home medications when the patient's blood pressure starts to improve   PAF (paroxysmal atrial fibrillation) (HCC) on anticoagulation therapy chads score of  greater than 2. EKG currently showing signs of sinus rhythm.  Continue Xarelto and beta blocker continue.    Abnormal EKG -Repeat EKG in a.m.  trending cardiac enzymes   Graves' disease -Continue home medication of methimazole   Hypokalemia: Due to nausea vomiting replaced and resolved.   GERD: Chronic -Continue Protonix    DVT prophylaxsis   Code Status:   Family Communication: family updated about patient's clinical progress Disposition Plan:  Charge once nausea has better      Consultants:  Does not want to see GI or surgery here  Procedures:  None  Antibiotics: Anti-infectives    Start     Dose/Rate Route Frequency Ordered Stop   02/04/15 1400  erythromycin ethylsuccinate (EES) 200 MG/5ML suspension 250 mg  Status:  Discontinued     250 mg Oral 3 times per day 02/04/15 1222 02/06/15 1729         HPI/Subjective: Patient in bed, denies any headache or chest pain, has nausea and had some emesis this morning. No BM in 5-7 days. No weakness.  Objective: Filed Vitals:   02/07/15 1806 02/07/15 2108 02/08/15 0600 02/08/15 1010  BP: 121/57 127/41 186/93 128/73  Pulse: 81 82 114 117  Temp: 98.3 F (36.8 C) 98.7 F (37.1 C) 98.6 F (37 C) 99 F (37.2 C)  TempSrc: Oral Oral Oral Oral  Resp: 18 18 18    Height:      Weight:      SpO2: 100% 100% 100% 100%    Intake/Output Summary (Last 24 hours) at 02/08/15 1044 Last data filed at 02/08/15 1000  Gross per 24 hour  Intake 2877.08 ml  Output   1005 ml  Net 1872.08 ml    Exam:  General: No acute respiratory distress Lungs: Clear to auscultation bilaterally without wheezes or crackles Cardiovascular: Regular rate and rhythm without murmur gallop or rub normal S1 and S2 Abdomen: Nontender, nondistended, soft, bowel sounds positive, no rebound, no ascites, no appreciable mass Extremities: No significant cyanosis, clubbing, or edema bilateral lower extremities     Data Review   Micro Results Recent Results (from the past 240 hour(s))  MRSA PCR Screening     Status: None   Collection Time: 02/08/15  2:40 AM  Result Value Ref Range  Status   MRSA by PCR NEGATIVE NEGATIVE Final    Comment:        The GeneXpert MRSA Assay (FDA approved for NASAL specimens only), is one component of a comprehensive MRSA colonization surveillance program. It is not intended to diagnose MRSA infection nor to guide or monitor treatment for MRSA infections.     Radiology Reports Dg Abd 1 View  02/04/2015  CLINICAL DATA:  Left-sided abdominal pain for 2 months. Constipation. EXAM: ABDOMEN - 1 VIEW COMPARISON:  January 14, 2015. FINDINGS: The bowel gas pattern is normal. Phleboliths are noted in the pelvis and left paraspinal region. No definite renal calculi are noted. No significant mound of stool is noted. IMPRESSION: No evidence of bowel obstruction or ileus. Electronically Signed   By: Marijo Conception, M.D.   On: 02/04/2015 14:00     CBC  Recent Labs Lab 02/04/15 0535 02/06/15 0429 02/08/15 0311  WBC 7.1 6.1 6.7  HGB 12.0 9.3* 10.8*  HCT 35.9* 29.5* 34.5*  PLT 366 300 370  MCV 86.1 88.6 88.5  MCH 28.8 27.9 27.7  MCHC 33.4 31.5 31.3  RDW 12.3 13.7 13.3  LYMPHSABS 1.3  --   --   MONOABS  0.5  --   --   EOSABS 0.0  --   --   BASOSABS 0.0  --   --     Chemistries   Recent Labs Lab 02/04/15 1204 02/04/15 2342 02/05/15 1150 02/05/15 1545 02/06/15 1144 02/07/15 1135 02/08/15 0311  NA 138 135  --  139 136  --  142  K 3.4* 2.7*  --  3.6 5.9*  --  3.7  CL 99* 96*  --  103 107  --  103  CO2 27 28  --  29 25  --  28  GLUCOSE 198* 179*  --  152* 205*  --  173*  BUN 9 9  --  7 10  --  7  CREATININE 0.67 0.98  --  0.84 0.93  --  1.01*  CALCIUM 8.8* 8.0*  --  9.2 9.1  --  9.8  MG  --   --  1.6*  --   --  1.4*  --   AST  --   --   --  23 50*  --  28  ALT  --   --   --  29 42  --  38  ALKPHOS  --   --   --  84 88  --  105  BILITOT  --   --   --  1.2 0.7  --  0.9   ------------------------------------------------------------------------------------------------------------------ estimated creatinine clearance is 60.3  mL/min (by C-G formula based on Cr of 1.01). ------------------------------------------------------------------------------------------------------------------ No results for input(s): HGBA1C in the last 72 hours. ------------------------------------------------------------------------------------------------------------------ No results for input(s): CHOL, HDL, LDLCALC, TRIG, CHOLHDL, LDLDIRECT in the last 72 hours. ------------------------------------------------------------------------------------------------------------------ No results for input(s): TSH, T4TOTAL, T3FREE, THYROIDAB in the last 72 hours.  Invalid input(s): FREET3 ------------------------------------------------------------------------------------------------------------------ No results for input(s): VITAMINB12, FOLATE, FERRITIN, TIBC, IRON, RETICCTPCT in the last 72 hours.  Coagulation profile No results for input(s): INR, PROTIME in the last 168 hours.  No results for input(s): DDIMER in the last 72 hours.  Cardiac Enzymes  Recent Labs Lab 02/04/15 1246 02/04/15 1842 02/04/15 2342  TROPONINI 0.05* 0.04* 0.03   ------------------------------------------------------------------------------------------------------------------ Invalid input(s): POCBNP   CBG:  Recent Labs Lab 02/07/15 0907 02/07/15 1154 02/07/15 1704 02/07/15 2105 02/08/15 0748  GLUCAP 124* 109* 191* 189* 245*       Studies: No results found.    Lab Results  Component Value Date   HGBA1C 8.6* 02/04/2015   HGBA1C 9.3* 11/13/2014   HGBA1C 8.9* 10/26/2014   Lab Results  Component Value Date   LDLCALC 95 07/02/2014   CREATININE 1.01* 02/08/2015       Scheduled Meds: . atenolol  12.5 mg Oral Daily  . atorvastatin  80 mg Oral Daily  . bisacodyl  10 mg Oral Daily  . cloNIDine  0.1 mg Oral BID  . dicyclomine  20 mg Oral BID  . escitalopram  10 mg Oral Daily  . feeding supplement (GLUCERNA SHAKE)  237 mL Oral BID BM  .  insulin aspart  0-15 Units Subcutaneous TID WC  . insulin aspart  0-5 Units Subcutaneous QHS  . insulin glargine  25 Units Subcutaneous QPM  . ketotifen  2 drop Both Eyes BID  . lisinopril  20 mg Oral Daily  . loratadine  10 mg Oral Daily  . methimazole  7.5 mg Oral QPM  . metoCLOPramide  5 mg Oral TID AC  . multivitamin with minerals  1 tablet Oral QPM  . omega-3 acid ethyl esters  1 g  Oral Daily  . pantoprazole  40 mg Oral BID  . polyethylene glycol  17 g Oral BID  . rivaroxaban  20 mg Oral QPM  . senna-docusate  1 tablet Oral BID  . sucralfate  1 g Oral TID WC & HS  . [START ON 02/11/2015] Vitamin D (Ergocalciferol)  50,000 Units Oral Q7 days   Continuous Infusions: . sodium chloride 100 mL/hr at 02/08/15 0547    Principal Problem:   Diabetic gastroparesis ([place]) Active Problems:   Essential hypertension   PAF (paroxysmal atrial fibrillation) (HCC)   Graves' disease   Hypokalemia   Intractable nausea and vomiting   Nonspecific abnormal electrocardiogram (ECG) (EKG)   GERD (gastroesophageal reflux disease)   Abdominal pain    Time spent: 35 minutes   [facility] Pager [phone]. If 7PM-7AM, please contact night-coverage at [email or web address], password [facility] 02/08/2015, 10:44 AM  LOS: 4 days

## 2015-02-08 NOTE — Discharge Instructions (Addendum)
Follow with Primary MD [name], MD in 7 days   Get CBC, CMP, 2 view Chest X ray checked  by Primary MD next visit.    Activity: As tolerated with Full fall precautions use walker/cane & assistance as needed   Disposition Home     Diet: Small Low Carb meals 4-5 times a day.  Accuchecks 4 times/day, Once in AM empty stomach and then before each meal. Log in all results and show them to your Prim.MD in 3 days. If any glucose reading is under 80 or above 300 call your Prim MD immidiately. Follow Low glucose instructions for glucose under 80 as instructed.    For Heart failure patients - Check your Weight same time everyday, if you gain over 2 pounds, or you develop in leg swelling, experience more shortness of breath or chest pain, call your Primary MD immediately. Follow Cardiac Low Salt Diet and 1.5 lit/day fluid restriction.   On your next visit with your primary care physician please Get Medicines reviewed and adjusted.   Please request your Prim.MD to go over all Hospital Tests and Procedure/Radiological results at the follow up, please get all Hospital records sent to your Prim MD by signing hospital release before you go home.   If you experience worsening of your admission symptoms, develop shortness of breath, life threatening emergency, suicidal or homicidal thoughts you must seek medical attention immediately by calling 911 or calling your MD immediately  if symptoms less severe.  You Must read complete instructions/literature along with all the possible adverse reactions/side effects for all the Medicines you take and that have been prescribed to you. Take any new Medicines after you have completely understood and accpet all the possible adverse reactions/side effects.   Do not drive, operating heavy machinery, perform activities at heights, swimming or participation in water activities or provide baby sitting services if your were admitted for syncope or siezures  until you have seen by Primary MD or a Neurologist and advised to do so again.  Do not drive when taking Pain medications.    Do not take more than prescribed Pain, Sleep and Anxiety Medications  Special Instructions: If you have smoked or chewed Tobacco  in the last 2 yrs please stop smoking, stop any regular Alcohol  and or any Recreational drug use.  Wear Seat belts while driving.   Please note  You were cared for by a hospitalist during your hospital stay. If you have any questions about your discharge medications or the care you received while you were in the hospital after you are discharged, you can call the unit and asked to speak with the hospitalist on call if the hospitalist that took care of you is not available. Once you are discharged, your primary care physician will handle any further medical issues. Please note that NO REFILLS for any discharge medications will be authorized once you are discharged, as it is imperative that you return to your primary care physician (or establish a relationship with a primary care physician if you do not have one) for your aftercare needs so that they can reassess your need for medications and monitor your lab values.    Information on my medicine - XARELTO (Rivaroxaban)  This medication education was reviewed with me or my healthcare representative as part of my discharge preparation.    Why was Xarelto prescribed for you? Xarelto was prescribed for you to reduce the risk of a blood clot forming that can cause a  stroke if you have a medical condition called atrial fibrillation (a type of irregular heartbeat).  What do you need to know about xarelto ? Take your Xarelto ONCE DAILY at the same time every day with your evening meal. If you have difficulty swallowing the tablet whole, you may crush it and mix in applesauce just prior to taking your dose.  Take Xarelto exactly as prescribed by your doctor and DO NOT stop taking Xarelto  without talking to the doctor who prescribed the medication.  Stopping without other stroke prevention medication to take the place of Xarelto may increase your risk of developing a clot that causes a stroke.  Refill your prescription before you run out.  After discharge, you should have regular check-up appointments with your healthcare provider that is prescribing your Xarelto.  In the future your dose may need to be changed if your kidney function or weight changes by a significant amount.  What do you do if you miss a dose? If you are taking Xarelto ONCE DAILY and you miss a dose, take it as soon as you remember on the same day then continue your regularly scheduled once daily regimen the next day. Do not take two doses of Xarelto at the same time or on the same day.   Important Safety Information A possible side effect of Xarelto is bleeding. You should call your healthcare provider right away if you experience any of the following: ? Bleeding from an injury or your nose that does not stop. ? Unusual colored urine (red or dark brown) or unusual colored stools (red or black). ? Unusual bruising for unknown reasons. ? A serious fall or if you hit your head (even if there is no bleeding).  Some medicines may interact with Xarelto and might increase your risk of bleeding while on Xarelto. To help avoid this, consult your healthcare provider or pharmacist prior to using any new prescription or non-prescription medications, including herbals, vitamins, non-steroidal anti-inflammatory drugs (NSAIDs) and supplements.  This website has more information on Xarelto: [email or web address]

## 2015-02-09 LAB — GLUCOSE, CAPILLARY
Glucose-Capillary: 142 mg/dL — ABNORMAL HIGH (ref 65–99)
Glucose-Capillary: 81 mg/dL (ref 65–99)

## 2015-02-09 MED ORDER — METOCLOPRAMIDE HCL 5 MG PO TABS: 5.0000 mg | ORAL_TABLET | Freq: Three times a day (TID) | ORAL | Status: DC

## 2015-02-09 MED ORDER — ONDANSETRON 4 MG PO TBDP: 4.0000 mg | ORAL_TABLET | Freq: Three times a day (TID) | ORAL | Status: DC | PRN

## 2015-02-09 NOTE — Discharge Summary (Signed)
Crystal Peck, is a 49 y.o. female  DOB 01-21-1966  MRN [id number].  Admission date:  02/04/2015  Admitting Physician  [name], MD  Discharge Date:  02/09/2015   Primary MD  [name], MD  Recommendations for primary care physician for things to follow:   Check CBC, CMP, glycemic control closely.   Admission Diagnosis  Hypokalemia [E87.6] Diabetic gastroparesis (HCC) [E11.43, K31.84]   Discharge Diagnosis  Hypokalemia [E87.6] Diabetic gastroparesis ([place]) [E11.43, K31.84]     Principal Problem:   Diabetic gastroparesis ([place]) Active Problems:   Essential hypertension   PAF (paroxysmal atrial fibrillation) (HCC)   Graves' disease   Hypokalemia   Intractable nausea and vomiting   Nonspecific abnormal electrocardiogram (ECG) (EKG)   GERD (gastroesophageal reflux disease)   Abdominal pain      Past Medical History  Diagnosis Date  . Asthma   . Diabetes mellitus   . Hyperlipidemia   . Hypertension   . GERD (gastroesophageal reflux disease)   . Cerebellar tumor ([place])   . Obesity   . Sleep apnea   . A-fib ([place])   . Thyroid disease   . Chest pain 03/27/2014    negative lexiscan myoview    Past Surgical History  Procedure Laterality Date  . Cholecystectomy    . Partial hysterectomy      ovaries remain  . Nasal sinus surgery      Dr. Lyn Hollingshead  . Abdominal hysterectomy         HPI  from the history and physical done on the day of admission:   Patient is a 49 year old African-American female with a past medical history significant for diabetes mellitus type 2 uncontrolled complicated by gastroparesis, hypertension, atrial fibrillation, and hyperthyroidism; who presents with abdominal pain with intractable nausea and vomiting. Patient has been seen in the ED on 10/15 and  10/13 with similar complaints. She reports that she woke up this morning throwing up approximately 8 times. Emesis was a yellow bile color. She reports that she try to eat yogurt, but it did not stay down. Patient has been tried on Reglan and reports that it used to work, but no longer is helping. Associated symptoms include constipation and weight loss. Last bowel movement was reported a few days ago. In the last 3 months, she reports losing approximately 10 pounds. First diagnosed in August 2016. She reports having a gastric emptying study. She reports that she was scheduled an appointment with Dr. [name] of gastroenterology next week on October 24th. Per patient report her diabetes is well controlled, however her last hemoglobin A1c was 9.3 two months ago.      Hospital Course:    Diabetic gastroparesis ([place]), exacerbated in the setting of hypokalemia.  Likely secondary to uncontrolled diabetes mellitus type 2. Most recent hemoglobin A1c was 8.6 on 02/04/15 Started on by mouth Reglan 5 mg 3 times a day along with IV erythromycin and bowel regimen for constipation, had a bowel movement last night, tolerating diet, no nausea or emesis in  the last 24 hours. Abdominal exam benign. Each ear B 2 unremarkable. Patient does not want to see GI or general surgery here. Has physicians in [place] and wants to follow with them. She has an appointment with her GI physician on coming Monday in 2 days from now. Has been encouraged to follow with them. Will be discharged on Reglan 3 times a day for the next week.  -Patient to follow-up with general surgery [name], MD on 10/24 [name], MD on 10/[facility] at [facility]   Intractable nausea and vomiting: resolved likely due to gastroparesis, KUB unremarkable abdominal exam benign. Patient refused CT scan abdomen pelvis.   Diabetes mellitus type 2 uncontrolled last hemoglobin A1c 8.6 Resume home regimen request PCP to follow  glycemic control and A1c closely.  CBG (last 3)   Recent Labs  02/08/15 1716 02/08/15 2042 02/09/15 0747  GLUCAP 86 99 142*    Essential hypertension stable  -Restart home medications unchanged   PAF (paroxysmal atrial fibrillation) (HCC) on anticoagulation therapy chads score of greater than 2. in sinus here. Continue Xarelto and beta blocker continue.   Abnormal EKG -Upon admission, on repeat EKG shows only nonspecific changes mostly secondary to mild LVH. Troponin trend in non-ACS pattern. Chest pain-free. On beta blocker, statin and xaralto which she will continue and follow with her primary cardiologist in 1-2 weeks post discharge.     Graves' disease -Continue home medication of methimazole   Hypokalemia: Due to nausea vomiting replaced and resolved.       Discharge Condition: Stable  Follow UP  Follow-up Information    Follow up with [name], MD. Schedule an appointment as soon as possible for a visit in 1 week.   Specialty:  Family Medicine   Why:  Along with her stomach doctor on Monday   Contact information:   [address] [place] [address] [phone]        Consults obtained - she did not want to see GI or surgery here  Diet and Activity recommendation: See Discharge Instructions below  Discharge Instructions       Discharge Instructions    Discharge instructions    Complete by:  As directed   Follow with Primary MD [name], MD in 7 days   Get CBC, CMP, 2 view Chest X ray checked  by Primary MD next visit.    Activity: As tolerated with Full fall precautions use walker/cane & assistance as needed   Disposition Home     Diet: Small Low Carb meals 4-5 times a day.  Accuchecks 4 times/day, Once in AM empty stomach and then before each meal. Log in all results and show them to your Prim.MD in 3 days. If any glucose reading is under 80 or above 300 call your Prim MD immidiately. Follow Low  glucose instructions for glucose under 80 as instructed.    For Heart failure patients - Check your Weight same time everyday, if you gain over 2 pounds, or you develop in leg swelling, experience more shortness of breath or chest pain, call your Primary MD immediately. Follow Cardiac Low Salt Diet and 1.5 lit/day fluid restriction.   On your next visit with your primary care physician please Get Medicines reviewed and adjusted.   Please request your Prim.MD to go over all Hospital Tests and Procedure/Radiological results at the follow up, please get all Hospital records sent to your Prim MD by signing hospital release before  you go home.   If you experience worsening of your admission symptoms, develop shortness of breath, life threatening emergency, suicidal or homicidal thoughts you must seek medical attention immediately by calling 911 or calling your MD immediately  if symptoms less severe.  You Must read complete instructions/literature along with all the possible adverse reactions/side effects for all the Medicines you take and that have been prescribed to you. Take any new Medicines after you have completely understood and accpet all the possible adverse reactions/side effects.   Do not drive, operating heavy machinery, perform activities at heights, swimming or participation in water activities or provide baby sitting services if your were admitted for syncope or siezures until you have seen by Primary MD or a Neurologist and advised to do so again.  Do not drive when taking Pain medications.    Do not take more than prescribed Pain, Sleep and Anxiety Medications  Special Instructions: If you have smoked or chewed Tobacco  in the last 2 yrs please stop smoking, stop any regular Alcohol  and or any Recreational drug use.  Wear Seat belts while driving.   Please note  You were cared for by a hospitalist during your hospital stay. If you have any questions about your discharge  medications or the care you received while you were in the hospital after you are discharged, you can call the unit and asked to speak with the hospitalist on call if the hospitalist that took care of you is not available. Once you are discharged, your primary care physician will handle any further medical issues. Please note that NO REFILLS for any discharge medications will be authorized once you are discharged, as it is imperative that you return to your primary care physician (or establish a relationship with a primary care physician if you do not have one) for your aftercare needs so that they can reassess your need for medications and monitor your lab values.     Increase activity slowly    Complete by:  As directed              Discharge Medications       Medication List    TAKE these medications        albuterol 108 (90 BASE) MCG/ACT inhaler  Commonly known as:  PROVENTIL HFA;VENTOLIN HFA  Inhale 2 puffs into the lungs every 4 (four) hours as needed for wheezing or shortness of breath.     ALPRAZolam 0.5 MG tablet  Commonly known as:  XANAX  Take 1 tablet (0.5 mg total) by mouth 2 (two) times daily as needed for anxiety.     amLODipine 5 MG tablet  Commonly known as:  NORVASC  Take 5 mg by mouth daily.     atenolol 25 MG tablet  Commonly known as:  TENORMIN  Take 1 tablet (25 mg total) by mouth daily as needed.     atenolol 100 MG tablet  Commonly known as:  TENORMIN  Take 1 tablet (100 mg total) by mouth every evening.     atorvastatin 80 MG tablet  Commonly known as:  LIPITOR  Take 1 tablet (80 mg total) by mouth daily.     azelastine 0.05 % ophthalmic solution  Commonly known as:  OPTIVAR  Place 1 drop into both eyes 2 (two) times daily.     beclomethasone 80 MCG/ACT inhaler  Commonly known as:  QVAR  Inhale 3 puffs into the lungs daily.     canagliflozin 300 MG Tabs  tablet  Commonly known as:  INVOKANA  Take 300 mg by mouth daily before breakfast.      cetirizine 10 MG tablet  Commonly known as:  ZYRTEC  Take 1 tablet (10 mg total) by mouth daily.     cloNIDine 0.1 MG tablet  Commonly known as:  CATAPRES  Take 1 tablet (0.1 mg total) by mouth 2 (two) times daily.     dicyclomine 20 MG tablet  Commonly known as:  BENTYL  Take 1 tablet (20 mg total) by mouth 2 (two) times daily.     escitalopram 10 MG tablet  Commonly known as:  LEXAPRO  Take 1 tablet (10 mg total) by mouth daily.     Fish Oil 1000 MG Caps  Take 2,000 mg by mouth every evening.     glipiZIDE 10 MG 24 hr tablet  Commonly known as:  GLUCOTROL XL  Take 10 mg by mouth daily with breakfast.     glucose blood test strip  1 each by Other route as needed. Onetouch ultra test strip     IRON PO  Take 2 tablets by mouth 3 (three) times a week.     LANTUS 100 UNIT/ML injection  Generic drug:  insulin glargine  Inject 32 Units into the skin every evening.     LANTUS SOLOSTAR 100 UNIT/ML Solostar Pen  Generic drug:  Insulin Glargine  Inject 20 Units into the skin 4 (four) times daily -  before meals and at bedtime. AS DIRECTED     lisinopril-hydrochlorothiazide 20-12.5 MG tablet  Commonly known as:  PRINZIDE,ZESTORETIC  Take 1 tablet by mouth 2 (two) times daily.     metFORMIN 1000 MG tablet  Commonly known as:  GLUCOPHAGE  Take 1,000 mg by mouth 2 (two) times daily with a meal.     methimazole 5 MG tablet  Commonly known as:  TAPAZOLE  Take 7.5 mg by mouth every evening.     metoCLOPramide 10 MG tablet  Commonly known as:  REGLAN  Take 1 tablet (10 mg total) by mouth every 6 (six) hours as needed for nausea (nausea/headache).     metoCLOPramide 5 MG tablet  Commonly known as:  REGLAN  Take 1 tablet (5 mg total) by mouth 3 (three) times daily before meals.     multivitamin with minerals Tabs tablet  Take 1 tablet by mouth every evening.     NOVOLOG FLEXPEN 100 UNIT/ML FlexPen  Generic drug:  insulin aspart  Inject 6 Units into the skin See admin  instructions. Per sliding scale     ondansetron 4 MG disintegrating tablet  Commonly known as:  ZOFRAN ODT  Take 1 tablet (4 mg total) by mouth every 8 (eight) hours as needed for nausea or vomiting.     pantoprazole 40 MG tablet  Commonly known as:  PROTONIX  Take 1 tablet (40 mg total) by mouth daily.     polyethylene glycol packet  Commonly known as:  MIRALAX / GLYCOLAX  Take 17 g by mouth daily.     potassium chloride SA 20 MEQ tablet  Commonly known as:  K-DUR,KLOR-CON  Take 20 mEq by mouth daily as needed (for leg cramping). Take one tablet by mouth daily.     promethazine 25 MG suppository  Commonly known as:  PHENERGAN  Place 1 suppository (25 mg total) rectally every 6 (six) hours as needed for nausea or vomiting.     rivaroxaban 20 MG Tabs tablet  Commonly known as:  Alveda Reasons  Take 20 mg by mouth daily.     sucralfate 1 G tablet  Commonly known as:  CARAFATE  Take 1 tablet (1 g total) by mouth 4 (four) times daily -  with meals and at bedtime.     Vitamin D (Ergocalciferol) 50000 UNITS Caps capsule  Commonly known as:  DRISDOL  Take 50,000 Units by mouth every 7 (seven) days.        Major procedures and Radiology Reports - PLEASE review detailed and final reports for all details, in brief -    Dg Abd 1 View  02/04/2015  CLINICAL DATA:  Left-sided abdominal pain for 2 months. Constipation. EXAM: ABDOMEN - 1 VIEW COMPARISON:  January 14, 2015. FINDINGS: The bowel gas pattern is normal. Phleboliths are noted in the pelvis and left paraspinal region. No definite renal calculi are noted. No significant mound of stool is noted. IMPRESSION: No evidence of bowel obstruction or ileus. Electronically Signed   By: Marijo Conception, M.D.   On: 02/04/2015 14:00   Dg Abd Portable 1v  02/08/2015  CLINICAL DATA:  Nausea vomiting. EXAM: PORTABLE ABDOMEN - 1 VIEW COMPARISON:  02/04/2015 FINDINGS: There are surgical clips over the right upper quadrant and single surgical clip  over the right pelvis. Contrast is present throughout the colon. There is no evidence of bowel obstruction. No definite free peritoneal air. Resolution of previously noted air-filled small bowel loops on the prior exam. Remainder of the exam is unchanged. IMPRESSION: Nonobstructive bowel gas pattern with mild contrast throughout the colon. Electronically Signed   By: Marin Olp M.D.   On: 02/08/2015 12:11    Micro Results      Recent Results (from the past 240 hour(s))  MRSA PCR Screening     Status: None   Collection Time: 02/08/15  2:40 AM  Result Value Ref Range Status   MRSA by PCR NEGATIVE NEGATIVE Final    Comment:        The GeneXpert MRSA Assay (FDA approved for NASAL specimens only), is one component of a comprehensive MRSA colonization surveillance program. It is not intended to diagnose MRSA infection nor to guide or monitor treatment for MRSA infections.        Today   Subjective    Breanne Olvera today has no headache,no chest abdominal pain,no new weakness tingling or numbness, feels better wants to go home today.     Objective   Blood pressure 179/75, pulse 90, temperature 98.1 F (36.7 C), temperature source Oral, resp. rate 16, height 5\' 1"  (1.549 m), weight 72.712 kg (160 lb 4.8 oz), SpO2 100 %.   Intake/Output Summary (Last 24 hours) at 02/09/15 0951 Last data filed at 02/09/15 1224  Gross per 24 hour  Intake 2711.67 ml  Output      2 ml  Net 2709.67 ml    Exam Awake Alert, Oriented x 3, No new F.N deficits, Normal affect [place].AT,PERRAL Supple Neck,No JVD, No cervical lymphadenopathy appriciated.  Symmetrical Chest wall movement, Good air movement bilaterally, CTAB RRR,No Gallops,Rubs or new Murmurs, No Parasternal Heave +ve B.Sounds, Abd Soft, Non tender, No organomegaly appriciated, No rebound -guarding or rigidity. No Cyanosis, Clubbing or edema, No new Rash or bruise   Data Review   CBC w Diff: Lab Results  Component Value Date   WBC  6.7 02/08/2015   WBC 6.8 03/19/2013   HGB 10.8* 02/08/2015   HGB 11.7* 03/19/2013   HCT 34.5* 02/08/2015   HCT 39.0 03/19/2013   PLT  370 02/08/2015   LYMPHOPCT 19 02/04/2015   MONOPCT 7 02/04/2015   EOSPCT 0 02/04/2015   BASOPCT 0 02/04/2015    CMP: Lab Results  Component Value Date   NA 142 02/08/2015   NA 147 02/27/2014   K 3.7 02/08/2015   CL 103 02/08/2015   CO2 28 02/08/2015   BUN 7 02/08/2015   BUN 19 02/27/2014   CREATININE 1.01* 02/08/2015   CREATININE 0.9 02/27/2014   GLU 112 02/27/2014   PROT 6.7 02/08/2015   ALBUMIN 3.4* 02/08/2015   BILITOT 0.9 02/08/2015   ALKPHOS 105 02/08/2015   AST 28 02/08/2015   ALT 38 02/08/2015  .  Lab Results  Component Value Date   HGBA1C 8.6* 02/04/2015     Total Time in preparing paper work, data evaluation and todays exam - 35 minutes  Thurnell Lose M.D on 02/09/2015 at 9:51 AM  [facility]   Office  [phone]

## 2015-02-09 NOTE — Plan of Care (Signed)
     Crystal Peck was admitted to the Hospital on 02/04/2015 and Discharged  02/09/2015 and should be excused from work/school   for 7  days starting 02/04/2015 , may return to work/school without any restrictions.  Call Lala Lund MD, [facility]  [phone] with questions.  Thurnell Lose M.D on 02/09/2015,at 9:58 AM  [facility]   Office  [phone]

## 2015-02-11 ENCOUNTER — Encounter: Payer: Self-pay | Admitting: General Practice

## 2015-02-14 ENCOUNTER — Telehealth: Payer: Self-pay | Admitting: Family Medicine

## 2015-02-14 NOTE — Telephone Encounter (Signed)
Patient dismissed from [facility] by [name] MD , effective February 11, 2015. Dismissal letter sent out by certified / registered mail.  DAJ  Received signed domestic return receipt verifying delivery of certified letter on March 27, 2015. Article number [id number]

## 2015-02-16 ENCOUNTER — Emergency Department (HOSPITAL_BASED_OUTPATIENT_CLINIC_OR_DEPARTMENT_OTHER)
Admission: EM | Admit: 2015-02-16 | Discharge: 2015-02-16 | Disposition: A | Discharge status: Home / Self Care | Payer: Medicaid Other | Attending: Emergency Medicine | Admitting: Emergency Medicine

## 2015-02-16 ENCOUNTER — Encounter (HOSPITAL_BASED_OUTPATIENT_CLINIC_OR_DEPARTMENT_OTHER): Payer: Self-pay

## 2015-02-16 DIAGNOSIS — Z79899 Other long term (current) drug therapy: Secondary | ICD-10-CM | POA: Diagnosis not present

## 2015-02-16 DIAGNOSIS — Z7951 Long term (current) use of inhaled steroids: Secondary | ICD-10-CM | POA: Diagnosis not present

## 2015-02-16 DIAGNOSIS — Z794 Long term (current) use of insulin: Secondary | ICD-10-CM | POA: Diagnosis not present

## 2015-02-16 DIAGNOSIS — R350 Frequency of micturition: Secondary | ICD-10-CM | POA: Insufficient documentation

## 2015-02-16 DIAGNOSIS — R112 Nausea with vomiting, unspecified: Secondary | ICD-10-CM | POA: Diagnosis present

## 2015-02-16 DIAGNOSIS — J45909 Unspecified asthma, uncomplicated: Secondary | ICD-10-CM | POA: Insufficient documentation

## 2015-02-16 DIAGNOSIS — E669 Obesity, unspecified: Secondary | ICD-10-CM | POA: Insufficient documentation

## 2015-02-16 DIAGNOSIS — K3184 Gastroparesis: Secondary | ICD-10-CM | POA: Insufficient documentation

## 2015-02-16 DIAGNOSIS — I10 Essential (primary) hypertension: Secondary | ICD-10-CM | POA: Insufficient documentation

## 2015-02-16 DIAGNOSIS — I4891 Unspecified atrial fibrillation: Secondary | ICD-10-CM | POA: Diagnosis not present

## 2015-02-16 DIAGNOSIS — E119 Type 2 diabetes mellitus without complications: Secondary | ICD-10-CM | POA: Diagnosis not present

## 2015-02-16 DIAGNOSIS — K219 Gastro-esophageal reflux disease without esophagitis: Secondary | ICD-10-CM | POA: Insufficient documentation

## 2015-02-16 LAB — CBC WITH DIFFERENTIAL/PLATELET
Basophils Absolute: 0 K/uL (ref 0.0–0.1)
Basophils Relative: 0 %
Eosinophils Absolute: 0 K/uL (ref 0.0–0.7)
Eosinophils Relative: 0 %
HCT: 34.1 % — ABNORMAL LOW (ref 36.0–46.0)
Hemoglobin: 11.3 g/dL — ABNORMAL LOW (ref 12.0–15.0)
Lymphocytes Relative: 13 %
Lymphs Abs: 0.8 K/uL (ref 0.7–4.0)
MCH: 28.8 pg (ref 26.0–34.0)
MCHC: 33.1 g/dL (ref 30.0–36.0)
MCV: 87 fL (ref 78.0–100.0)
Monocytes Absolute: 0.2 K/uL (ref 0.1–1.0)
Monocytes Relative: 4 %
Neutro Abs: 5.6 K/uL (ref 1.7–7.7)
Neutrophils Relative %: 83 %
Platelets: 316 K/uL (ref 150–400)
RBC: 3.92 MIL/uL (ref 3.87–5.11)
RDW: 12.8 % (ref 11.5–15.5)
WBC: 6.7 K/uL (ref 4.0–10.5)

## 2015-02-16 LAB — COMPREHENSIVE METABOLIC PANEL WITH GFR
ALT: 25 U/L (ref 14–54)
AST: 27 U/L (ref 15–41)
Albumin: 4.1 g/dL (ref 3.5–5.0)
Alkaline Phosphatase: 94 U/L (ref 38–126)
Anion gap: 12 (ref 5–15)
BUN: 16 mg/dL (ref 6–20)
CO2: 30 mmol/L (ref 22–32)
Calcium: 9.6 mg/dL (ref 8.9–10.3)
Chloride: 97 mmol/L — ABNORMAL LOW (ref 101–111)
Creatinine, Ser: 0.73 mg/dL (ref 0.44–1.00)
GFR calc Af Amer: >60 mL/min
GFR calc non Af Amer: >60 mL/min
Glucose, Bld: 263 mg/dL — ABNORMAL HIGH (ref 65–99)
Potassium: 3.3 mmol/L — ABNORMAL LOW (ref 3.5–5.1)
Sodium: 139 mmol/L (ref 135–145)
Total Bilirubin: 0.8 mg/dL (ref 0.3–1.2)
Total Protein: 8.1 g/dL (ref 6.5–8.1)

## 2015-02-16 LAB — URINALYSIS, ROUTINE W REFLEX MICROSCOPIC
Bilirubin Urine: NEGATIVE
Glucose, UA: >1000 mg/dL — AB
Hgb urine dipstick: NEGATIVE
Ketones, ur: 15 mg/dL — AB
Leukocytes, UA: NEGATIVE
Nitrite: NEGATIVE
Protein, ur: 30 mg/dL — AB
Specific Gravity, Urine: 1.024 (ref 1.005–1.030)
Urobilinogen, UA: 0.2 mg/dL (ref 0.0–1.0)
pH: 7 (ref 5.0–8.0)

## 2015-02-16 LAB — URINE MICROSCOPIC-ADD ON

## 2015-02-16 MED ORDER — METOCLOPRAMIDE HCL 5 MG/ML IJ SOLN
10.0000 mg | Freq: Once | INTRAMUSCULAR | Status: AC
  Administered 2015-02-16: 10 mg via INTRAVENOUS
  Filled 2015-02-16: qty 2

## 2015-02-16 MED ORDER — PROMETHAZINE HCL 25 MG/ML IJ SOLN
25.0000 mg | Freq: Once | INTRAMUSCULAR | Status: AC
  Administered 2015-02-16: 25 mg via INTRAVENOUS
  Filled 2015-02-16: qty 1

## 2015-02-16 MED ORDER — ONDANSETRON HCL 4 MG/2ML IJ SOLN
4.0000 mg | Freq: Once | INTRAMUSCULAR | Status: AC
Start: 2015-02-16 — End: 2015-02-16
  Administered 2015-02-16: 4 mg via INTRAVENOUS
  Filled 2015-02-16: qty 2

## 2015-02-16 MED ORDER — POTASSIUM CHLORIDE 10 MEQ/100ML IV SOLN
10.0000 meq | INTRAVENOUS | Status: AC
  Administered 2015-02-16 (×2): 10 meq via INTRAVENOUS
  Filled 2015-02-16: qty 100

## 2015-02-16 MED ORDER — SODIUM CHLORIDE 0.9 % IV BOLUS (SEPSIS)
1000.0000 mL | Freq: Once | INTRAVENOUS | Status: AC
  Administered 2015-02-16: 1000 mL via INTRAVENOUS

## 2015-02-16 MED ORDER — PROMETHAZINE HCL 25 MG PO TABS: 25.0000 mg | ORAL_TABLET | Freq: Four times a day (QID) | ORAL | Status: DC | PRN

## 2015-02-16 MED ORDER — POTASSIUM CHLORIDE 20 MEQ/15ML (10%) PO SOLN
40.0000 meq | Freq: Once | ORAL | Status: DC
  Filled 2015-02-16: qty 30

## 2015-02-16 NOTE — ED Notes (Signed)
Pt answering only some questions, pt pre-occupied with nausea, emesis bag in hand, states, "still hurting", family at [facility], alert, NAD, calm, vomiting, interactive, no dyspnea noted.

## 2015-02-16 NOTE — ED Notes (Signed)
Pt vomited. Up to b/r, steady gait, VSS.

## 2015-02-16 NOTE — ED Notes (Addendum)
no significant improvement or worsening, continues to clutch emesis bag, no significant emesis, continual nausea and scant saliva sputumn produced. Has been sipping on sprite/ soda. VSS.

## 2015-02-16 NOTE — ED Notes (Signed)
EDP in to speak with pt about results and d/c plan.

## 2015-02-16 NOTE — Discharge Instructions (Signed)
Gastroparesis °Gastroparesis, also called delayed gastric emptying, is a condition in which food takes longer than normal to empty from the stomach. The condition is usually long-lasting (chronic). °CAUSES °This condition may be caused by: °· An endocrine disorder, such as hypothyroidism or diabetes. Diabetes is the most common cause of this condition. °· A nervous system disease, such as Parkinson disease or multiple sclerosis. °· Cancer, infection, or surgery of the stomach or vagus nerve. °· A connective tissue disorder, such as scleroderma. °· Certain medicines. °In most cases, the cause is not known. °RISK FACTORS °This condition is more likely to develop in: °· People with certain disorders, including endocrine disorders, eating disorders, amyloidosis, and scleroderma. °· People with certain diseases, including Parkinson disease or multiple sclerosis. °· People with cancer or infection of the stomach or vagus nerve. °· People who have had surgery on the stomach or vagus nerve. °· People who take certain medicines. °· Women. °SYMPTOMS °Symptoms of this condition include: °· An early feeling of fullness when eating. °· Nausea. °· Weight loss. °· Vomiting. °· Heartburn. °· Abdominal bloating. °· Inconsistent blood glucose levels. °· Lack of appetite. °· Acid from the stomach coming up into the esophagus (gastroesophageal reflux). °· Spasms of the stomach. °Symptoms may come and go. °DIAGNOSIS °This condition is diagnosed with tests, such as: °· Tests that check how long it takes food to move through the stomach and intestines. These tests include: °¨ Upper gastrointestinal (GI) series. In this test, X-rays of the intestines are taken after you drink a liquid. The liquid makes the intestines show up better on the X-rays. °¨ Gastric emptying scintigraphy. In this test, scans are taken after you eat food that contains a small amount of radioactive material. °¨ Wireless capsule GI monitoring system. This test  involves swallowing a capsule that records information about movement through the stomach. °· Gastric manometry. This test measures electrical and muscular activity in the stomach. It is done with a thin tube that is passed down the throat and into the stomach. °· Endoscopy. This test checks for abnormalities in the lining of the stomach. It is done with a long, thin tube that is passed down the throat and into the stomach. °· An ultrasound. This test can help rule out gallbladder disease or pancreatitis as a cause of your symptoms. It uses sound waves to take pictures of the inside of your body. °TREATMENT °There is no cure for gastroparesis. This condition may be managed with: °· Treatment of the underlying condition causing the gastroparesis. °· Lifestyle changes, including exercise and dietary changes. Dietary changes can include: °¨ Changes in what and when you eat. °¨ Eating smaller meals more often. °¨ Eating low-fat foods. °¨ Eating low-fiber forms of high-fiber foods, such as cooked vegetables instead of raw vegetables. °¨ Having liquid foods in place of solid foods. Liquid foods are easier to digest. °· Medicines. These may be given to control nausea and vomiting and to stimulate stomach muscles. °· Getting food through a feeding tube. This may be done in severe cases. °· A gastric neurostimulator. This is a device that is inserted into the body with surgery. It helps improve stomach emptying and control nausea and vomiting. °HOME CARE INSTRUCTIONS °· Follow your health care provider's instructions about exercise and diet. °· Take medicines only as directed by your health care provider. °SEEK MEDICAL CARE IF: °· Your symptoms do not improve with treatment. °· You have new symptoms. °SEEK IMMEDIATE MEDICAL CARE IF: °· You have   severe abdominal pain that does not improve with treatment. °· You have nausea that does not go away. °· You cannot keep fluids down. °  °This information is not intended to replace  advice given to you by your health care provider. Make sure you discuss any questions you have with your health care provider. °  °Document Released: 04/06/2005 Document Revised: 08/21/2014 Document Reviewed: 04/02/2014 °Elsevier Interactive Patient Education ©2016 [employer] ° °

## 2015-02-16 NOTE — ED Notes (Signed)
Pt with known gastroparesis, emesis today, has not responded to Zofran at home. Actively vomiting in triage.

## 2015-02-16 NOTE — ED Notes (Addendum)
Pt states, "trying not to be admitted", moaning/ sighing. "Pain unchanged, nausea better but remains". Alert, NAD, calm, passively interactive, VSS, no emesis, no dyspnea noted.

## 2015-02-16 NOTE — ED Notes (Signed)
Given 469ml soda fluid PO challenge

## 2015-02-16 NOTE — ED Provider Notes (Signed)
CSN: [id number]     Arrival date & time 02/16/15  1649 History  By signing my name below, I, Helane Gunther, attest that this documentation has been prepared under the direction and in the presence of Pattricia Boss, MD. Electronically Signed: Helane Gunther, ED Scribe. 02/16/2015. 5:53 PM.     Chief Complaint  Patient presents with  . Emesis   The history is provided by the patient. No language interpreter was used.   HPI Comments: Crystal Peck is a 49 y.o. female with a PMHx of GERD, IDDM, HLD, and HTN, as well as a PSHx of cholecystectomy and partial hysterectomy who presents to the Emergency Department complaining of emesis onset this morning. She reports associated bladder incontinence, nausea, abdominal pain, and frequency. She notes that she has had similar episodes from gastroparesis before, noting her most recent one occurred on 10/17 for which she was seen in the ED. She states that she sees her PCP (Dr Isabelle Course at [facility]) for this on a regular basis. She notes she takes Zofram for nausea, and has taken it 2x already today, with no relief. She states she takes metformin as well as insulin shots for her DM. She notes her blood glucose level was 180 when she last measured it. She notes she was released from the hospital 1 week ago. Pt denies fever and diarrhea.  Past Medical History  Diagnosis Date  . Asthma   . Diabetes mellitus   . Hyperlipidemia   . Hypertension   . GERD (gastroesophageal reflux disease)   . Cerebellar tumor ([place])   . Obesity   . Sleep apnea   . A-fib ([place])   . Thyroid disease   . Chest pain 03/27/2014    negative lexiscan myoview   Past Surgical History  Procedure Laterality Date  . Cholecystectomy    . Partial hysterectomy      ovaries remain  . Nasal sinus surgery      Dr. Lyn Hollingshead  . Abdominal hysterectomy     Family History  Problem Relation Age of Onset  . Coronary artery disease Father   . Hypertension Father   . Diabetes       maternal grandparents  . Hyperlipidemia Other    Social History  Substance Use Topics  . Smoking status: Never Smoker   . Smokeless tobacco: Never Used  . Alcohol Use: No   OB History    No data available     Review of Systems  Constitutional: Negative for fever.  Gastrointestinal: Positive for nausea, vomiting and abdominal pain. Negative for diarrhea.  Genitourinary: Positive for frequency.  All other systems reviewed and are negative.   Allergies  Review of patient's allergies indicates no known allergies.  Home Medications   Prior to Admission medications   Medication Sig Start Date End Date Taking? Authorizing Provider  albuterol (PROVENTIL HFA;VENTOLIN HFA) 108 (90 BASE) MCG/ACT inhaler Inhale 2 puffs into the lungs every 4 (four) hours as needed for wheezing or shortness of breath.   Yes Historical Provider, MD  ALPRAZolam Duanne Moron) 0.5 MG tablet Take 1 tablet (0.5 mg total) by mouth 2 (two) times daily as needed for anxiety. 10/29/14  Yes Midge Minium, MD  amLODipine (NORVASC) 5 MG tablet Take 5 mg by mouth daily.   Yes Historical Provider, MD  atenolol (TENORMIN) 100 MG tablet Take 1 tablet (100 mg total) by mouth every evening. 10/15/14  Yes Midge Minium, MD  atenolol (TENORMIN) 25 MG tablet Take 1  tablet (25 mg total) by mouth daily as needed. 09/13/14  Yes Sueanne Margarita, MD  atorvastatin (LIPITOR) 80 MG tablet Take 1 tablet (80 mg total) by mouth daily. 11/22/14  Yes Sueanne Margarita, MD  azelastine (OPTIVAR) 0.05 % ophthalmic solution Place 1 drop into both eyes 2 (two) times daily. 10/15/14  Yes Midge Minium, MD  beclomethasone (QVAR) 80 MCG/ACT inhaler Inhale 3 puffs into the lungs daily. Patient taking differently: Inhale 3 puffs into the lungs daily as needed (wheezing).  10/12/14  Yes Colon Branch, MD  canagliflozin (INVOKANA) 300 MG TABS tablet Take 300 mg by mouth daily before breakfast.   Yes Historical Provider, MD  cetirizine (ZYRTEC) 10 MG tablet  Take 1 tablet (10 mg total) by mouth daily. 10/15/14  Yes Midge Minium, MD  cloNIDine (CATAPRES) 0.1 MG tablet Take 1 tablet (0.1 mg total) by mouth 2 (two) times daily. 12/20/14  Yes Midge Minium, MD  dicyclomine (BENTYL) 20 MG tablet Take 1 tablet (20 mg total) by mouth 2 (two) times daily. 12/12/14  Yes Dorie Rank, MD  escitalopram (LEXAPRO) 10 MG tablet Take 1 tablet (10 mg total) by mouth daily. 10/15/14  Yes Midge Minium, MD  glipiZIDE (GLUCOTROL XL) 10 MG 24 hr tablet Take 10 mg by mouth daily with breakfast.   Yes Historical Provider, MD  glucose blood test strip 1 each by Other route as needed. Onetouch ultra test strip    Yes Historical Provider, MD  insulin aspart (NOVOLOG FLEXPEN) 100 UNIT/ML FlexPen Inject 6 Units into the skin See admin instructions. Per sliding scale 01/30/15  Yes Historical Provider, MD  lisinopril-hydrochlorothiazide (PRINZIDE,ZESTORETIC) 20-12.5 MG per tablet Take 1 tablet by mouth 2 (two) times daily. 11/26/14  Yes Midge Minium, MD  metoCLOPramide (REGLAN) 10 MG tablet Take 1 tablet (10 mg total) by mouth every 6 (six) hours as needed for nausea (nausea/headache). 12/11/14  Yes Orpah Greek, MD  metoCLOPramide (REGLAN) 5 MG tablet Take 1 tablet (5 mg total) by mouth 3 (three) times daily before meals. 02/09/15  Yes Thurnell Lose, MD  ondansetron (ZOFRAN ODT) 4 MG disintegrating tablet Take 1 tablet (4 mg total) by mouth every 8 (eight) hours as needed for nausea or vomiting. 02/09/15  Yes Thurnell Lose, MD  pantoprazole (PROTONIX) 40 MG tablet Take 1 tablet (40 mg total) by mouth daily. Patient taking differently: Take 40 mg by mouth 2 (two) times daily.  06/07/14  Yes Midge Minium, MD  promethazine (PHENERGAN) 25 MG suppository Place 1 suppository (25 mg total) rectally every 6 (six) hours as needed for nausea or vomiting. 12/13/14  Yes Merryl Hacker, MD  sucralfate (CARAFATE) 1 G tablet Take 1 tablet (1 g total) by mouth 4  (four) times daily -  with meals and at bedtime. 12/20/14  Yes Midge Minium, MD  insulin glargine (LANTUS) 100 UNIT/ML injection Inject 32 Units into the skin every evening.    Historical Provider, MD  IRON PO Take 2 tablets by mouth 3 (three) times a week.     Historical Provider, MD  LANTUS SOLOSTAR 100 UNIT/ML Solostar Pen Inject 20 Units into the skin 4 (four) times daily -  before meals and at bedtime. AS DIRECTED 02/27/14   Historical Provider, MD  metFORMIN (GLUCOPHAGE) 1000 MG tablet Take 1,000 mg by mouth 2 (two) times daily with a meal.    Historical Provider, MD  methimazole (TAPAZOLE) 5 MG tablet Take 7.5 mg  by mouth every evening.  02/27/14   Historical Provider, MD  Multiple Vitamin (MULTIVITAMIN WITH MINERALS) TABS tablet Take 1 tablet by mouth every evening.     Historical Provider, MD  Omega-3 Fatty Acids (FISH OIL) 1000 MG CAPS Take 2,000 mg by mouth every evening.     Historical Provider, MD  polyethylene glycol (MIRALAX / GLYCOLAX) packet Take 17 g by mouth daily. 01/30/15   Historical Provider, MD  potassium chloride SA (K-DUR,KLOR-CON) 20 MEQ tablet Take 20 mEq by mouth daily as needed (for leg cramping). Take one tablet by mouth daily. 02/26/12   Midge Minium, MD  rivaroxaban (XARELTO) 20 MG TABS tablet Take 20 mg by mouth daily. 07/02/14   Historical Provider, MD  Vitamin D, Ergocalciferol, (DRISDOL) 50000 UNITS CAPS capsule Take 50,000 Units by mouth every 7 (seven) days.    Historical Provider, MD   BP 146/68 mmHg  Pulse 87  Temp(Src) 97.9 F (36.6 C) (Oral)  Resp 18  Ht 5\' 1"  (1.549 m)  Wt 160 lb (72.576 kg)  BMI 30.25 kg/m2  SpO2 100% Physical Exam  Constitutional: She is oriented to person, place, and time. She appears well-developed and well-nourished.  HENT:  Head: Normocephalic and atraumatic.  Right Ear: External ear normal.  Left Ear: External ear normal.  Nose: Nose normal.  Mouth/Throat: Oropharynx is clear and moist.  Eyes: Conjunctivae and  EOM are normal. Pupils are equal, round, and reactive to light.  Neck: Normal range of motion. Neck supple.  Cardiovascular: Normal rate, regular rhythm, normal heart sounds and intact distal pulses.   Pulmonary/Chest: Effort normal and breath sounds normal.  Abdominal: Soft. Bowel sounds are normal. She exhibits no mass. There is no rebound and no guarding.  Mild diffuse epigastric ttp  Musculoskeletal: Normal range of motion.  Neurological: She is alert and oriented to person, place, and time. She has normal reflexes.  Skin: Skin is warm and dry.  Psychiatric: She has a normal mood and affect. Her behavior is normal. Judgment and thought content normal.  Nursing note and vitals reviewed.   ED Course  Procedures  DIAGNOSTIC STUDIES: Oxygen Saturation is 99% on RA, normal by my interpretation.    COORDINATION OF CARE: 5:37 PM - Discussed plans to order anti-nausea medication and diagnostic studies. Pt advised of plan for treatment and pt agrees.  Labs Review Labs Reviewed  CBC WITH DIFFERENTIAL/PLATELET - Abnormal; Notable for the following:    Hemoglobin 11.3 (*)    HCT 34.1 (*)    All other components within normal limits  COMPREHENSIVE METABOLIC PANEL - Abnormal; Notable for the following:    Potassium 3.3 (*)    Chloride 97 (*)    Glucose, Bld 263 (*)    All other components within normal limits  URINALYSIS, ROUTINE W REFLEX MICROSCOPIC (NOT AT [facility]) - Abnormal; Notable for the following:    Glucose, UA >1000 (*)    Ketones, ur 15 (*)    Protein, ur 30 (*)    All other components within normal limits  URINE MICROSCOPIC-ADD ON - Abnormal; Notable for the following:    Casts HYALINE CASTS (*)    All other components within normal limits    Imaging Review No results found. I have personally reviewed and evaluated these images and lab results as part of my medical decision-making.   EKG Interpretation None      MDM   Final diagnoses:  Non-intractable vomiting  with nausea, vomiting of unspecified type  Gastroparesis  Patient given iv fluids, two runs of potassium and antiemetics.  No more vomiting and tolerating clear liquids.  Plan phenergan for home as she has zofran but no phenergan.  Patient advised re return precautions and voices understanding.  I personally performed the services described in this documentation, which was scribed in my presence. The recorded information has been reviewed and considered.   Pattricia Boss, MD 02/16/15 [phone]

## 2015-02-18 ENCOUNTER — Encounter (HOSPITAL_BASED_OUTPATIENT_CLINIC_OR_DEPARTMENT_OTHER): Payer: Self-pay | Admitting: *Deleted

## 2015-02-18 ENCOUNTER — Emergency Department (HOSPITAL_BASED_OUTPATIENT_CLINIC_OR_DEPARTMENT_OTHER)
Admission: EM | Admit: 2015-02-18 | Discharge: 2015-02-18 | Disposition: A | Discharge status: Home / Self Care | Payer: Medicaid Other | Attending: Emergency Medicine | Admitting: Emergency Medicine

## 2015-02-18 DIAGNOSIS — J45909 Unspecified asthma, uncomplicated: Secondary | ICD-10-CM | POA: Insufficient documentation

## 2015-02-18 DIAGNOSIS — I10 Essential (primary) hypertension: Secondary | ICD-10-CM | POA: Diagnosis not present

## 2015-02-18 DIAGNOSIS — E876 Hypokalemia: Secondary | ICD-10-CM | POA: Insufficient documentation

## 2015-02-18 DIAGNOSIS — Z794 Long term (current) use of insulin: Secondary | ICD-10-CM | POA: Diagnosis not present

## 2015-02-18 DIAGNOSIS — Z79899 Other long term (current) drug therapy: Secondary | ICD-10-CM | POA: Diagnosis not present

## 2015-02-18 DIAGNOSIS — E1143 Type 2 diabetes mellitus with diabetic autonomic (poly)neuropathy: Secondary | ICD-10-CM | POA: Insufficient documentation

## 2015-02-18 DIAGNOSIS — E669 Obesity, unspecified: Secondary | ICD-10-CM | POA: Diagnosis not present

## 2015-02-18 DIAGNOSIS — K219 Gastro-esophageal reflux disease without esophagitis: Secondary | ICD-10-CM | POA: Diagnosis not present

## 2015-02-18 DIAGNOSIS — E785 Hyperlipidemia, unspecified: Secondary | ICD-10-CM | POA: Insufficient documentation

## 2015-02-18 DIAGNOSIS — Z7951 Long term (current) use of inhaled steroids: Secondary | ICD-10-CM | POA: Insufficient documentation

## 2015-02-18 DIAGNOSIS — I4891 Unspecified atrial fibrillation: Secondary | ICD-10-CM | POA: Diagnosis not present

## 2015-02-18 DIAGNOSIS — K3184 Gastroparesis: Secondary | ICD-10-CM

## 2015-02-18 DIAGNOSIS — Z85841 Personal history of malignant neoplasm of brain: Secondary | ICD-10-CM | POA: Insufficient documentation

## 2015-02-18 DIAGNOSIS — R1110 Vomiting, unspecified: Secondary | ICD-10-CM | POA: Diagnosis present

## 2015-02-18 LAB — BASIC METABOLIC PANEL
Anion gap: 12 (ref 5–15)
BUN: 19 mg/dL (ref 6–20)
CALCIUM: 8.7 mg/dL — AB (ref 8.9–10.3)
CO2: 29 mmol/L (ref 22–32)
CREATININE: 1.2 mg/dL — AB (ref 0.44–1.00)
Chloride: 100 mmol/L — ABNORMAL LOW (ref 101–111)
GFR calc non Af Amer: 52 mL/min — ABNORMAL LOW (ref >60)
Glucose, Bld: 252 mg/dL — ABNORMAL HIGH (ref 65–99)
Potassium: 2.6 mmol/L — CL (ref 3.5–5.1)
SODIUM: 141 mmol/L (ref 135–145)

## 2015-02-18 LAB — CBC WITH DIFFERENTIAL/PLATELET
BASOS PCT: 1 %
Basophils Absolute: 0 10*3/uL (ref 0.0–0.1)
EOS ABS: 0.1 10*3/uL (ref 0.0–0.7)
Eosinophils Relative: 1 %
HCT: 31.4 % — ABNORMAL LOW (ref 36.0–46.0)
Hemoglobin: 10.3 g/dL — ABNORMAL LOW (ref 12.0–15.0)
Lymphocytes Relative: 25 %
Lymphs Abs: 1.7 10*3/uL (ref 0.7–4.0)
MCH: 28.6 pg (ref 26.0–34.0)
MCHC: 32.8 g/dL (ref 30.0–36.0)
MCV: 87.2 fL (ref 78.0–100.0)
MONO ABS: 0.7 10*3/uL (ref 0.1–1.0)
MONOS PCT: 10 %
Neutro Abs: 4.2 10*3/uL (ref 1.7–7.7)
Neutrophils Relative %: 63 %
Platelets: 338 10*3/uL (ref 150–400)
RBC: 3.6 MIL/uL — ABNORMAL LOW (ref 3.87–5.11)
RDW: 13 % (ref 11.5–15.5)
WBC: 6.6 10*3/uL (ref 4.0–10.5)

## 2015-02-18 MED ORDER — POTASSIUM CHLORIDE ER 10 MEQ PO TBCR: 20.0000 meq | EXTENDED_RELEASE_TABLET | Freq: Two times a day (BID) | ORAL | Status: DC

## 2015-02-18 MED ORDER — METOCLOPRAMIDE HCL 5 MG/ML IJ SOLN
10.0000 mg | Freq: Once | INTRAMUSCULAR | Status: AC
  Administered 2015-02-18: 10 mg via INTRAVENOUS
  Filled 2015-02-18: qty 2

## 2015-02-18 MED ORDER — HALOPERIDOL LACTATE 5 MG/ML IJ SOLN
5.0000 mg | Freq: Once | INTRAMUSCULAR | Status: AC
  Administered 2015-02-18: 5 mg via INTRAVENOUS
  Filled 2015-02-18: qty 1

## 2015-02-18 MED ORDER — SODIUM CHLORIDE 0.9 % IV BOLUS (SEPSIS)
1000.0000 mL | Freq: Once | INTRAVENOUS | Status: AC
  Administered 2015-02-18: 1000 mL via INTRAVENOUS

## 2015-02-18 MED ORDER — HALOPERIDOL LACTATE 5 MG/ML IJ SOLN: 5.0000 mg | Freq: Once | INTRAMUSCULAR | Status: DC

## 2015-02-18 MED ORDER — POTASSIUM CHLORIDE CRYS ER 20 MEQ PO TBCR
40.0000 meq | EXTENDED_RELEASE_TABLET | Freq: Once | ORAL | Status: AC
  Administered 2015-02-18: 40 meq via ORAL
  Filled 2015-02-18: qty 2

## 2015-02-18 MED ORDER — POTASSIUM CHLORIDE 10 MEQ/100ML IV SOLN
10.0000 meq | Freq: Once | INTRAVENOUS | Status: AC
  Administered 2015-02-18: 10 meq via INTRAVENOUS
  Filled 2015-02-18: qty 100

## 2015-02-18 NOTE — ED Notes (Signed)
K level 2.6 MD aware.

## 2015-02-18 NOTE — ED Provider Notes (Signed)
CSN: [id number]     Arrival date & time 02/18/15  0450 History   First MD Initiated Contact with Patient 02/18/15 [phone]     No chief complaint on file.    (Consider location/radiation/quality/duration/timing/severity/associated sxs/prior Treatment) HPI Comments: Patient is a 49 year old female with long-standing diabetes and diabetic gastroparesis. She frequents emergency departments with vomiting and abdominal pain. She was seen here just over 24 hours ago and received fluids and medications. She was discharged, however her vomiting has returned. She denies any fevers or chills. She denies any diarrhea or constipation.  The history is provided by the patient.    Past Medical History  Diagnosis Date  . Asthma   . Diabetes mellitus   . Hyperlipidemia   . Hypertension   . GERD (gastroesophageal reflux disease)   . Cerebellar tumor ([place])   . Obesity   . Sleep apnea   . A-fib ([place])   . Thyroid disease   . Chest pain 03/27/2014    negative lexiscan myoview   Past Surgical History  Procedure Laterality Date  . Cholecystectomy    . Partial hysterectomy      ovaries remain  . Nasal sinus surgery      Dr. Lyn Hollingshead  . Abdominal hysterectomy     Family History  Problem Relation Age of Onset  . Coronary artery disease Father   . Hypertension Father   . Diabetes      maternal grandparents  . Hyperlipidemia Other    Social History  Substance Use Topics  . Smoking status: Never Smoker   . Smokeless tobacco: Never Used  . Alcohol Use: No   OB History    No data available     Review of Systems  All other systems reviewed and are negative.     Allergies  Review of patient's allergies indicates no known allergies.  Home Medications   Prior to Admission medications   Medication Sig Start Date End Date Taking? Authorizing Provider  albuterol (PROVENTIL HFA;VENTOLIN HFA) 108 (90 BASE) MCG/ACT inhaler Inhale 2 puffs into the lungs every 4 (four) hours as needed for  wheezing or shortness of breath.    Historical Provider, MD  ALPRAZolam Duanne Moron) 0.5 MG tablet Take 1 tablet (0.5 mg total) by mouth 2 (two) times daily as needed for anxiety. 10/29/14   Midge Minium, MD  amLODipine (NORVASC) 5 MG tablet Take 5 mg by mouth daily.    Historical Provider, MD  atenolol (TENORMIN) 100 MG tablet Take 1 tablet (100 mg total) by mouth every evening. 10/15/14   Midge Minium, MD  atenolol (TENORMIN) 25 MG tablet Take 1 tablet (25 mg total) by mouth daily as needed. 09/13/14   Sueanne Margarita, MD  atorvastatin (LIPITOR) 80 MG tablet Take 1 tablet (80 mg total) by mouth daily. 11/22/14   Sueanne Margarita, MD  azelastine (OPTIVAR) 0.05 % ophthalmic solution Place 1 drop into both eyes 2 (two) times daily. 10/15/14   Midge Minium, MD  beclomethasone (QVAR) 80 MCG/ACT inhaler Inhale 3 puffs into the lungs daily. Patient taking differently: Inhale 3 puffs into the lungs daily as needed (wheezing).  10/12/14   Colon Branch, MD  canagliflozin (INVOKANA) 300 MG TABS tablet Take 300 mg by mouth daily before breakfast.    Historical Provider, MD  cetirizine (ZYRTEC) 10 MG tablet Take 1 tablet (10 mg total) by mouth daily. 10/15/14   Midge Minium, MD  cloNIDine (CATAPRES) 0.1 MG tablet Take 1 tablet (0.1  mg total) by mouth 2 (two) times daily. 12/20/14   Midge Minium, MD  dicyclomine (BENTYL) 20 MG tablet Take 1 tablet (20 mg total) by mouth 2 (two) times daily. 12/12/14   Dorie Rank, MD  escitalopram (LEXAPRO) 10 MG tablet Take 1 tablet (10 mg total) by mouth daily. 10/15/14   Midge Minium, MD  glipiZIDE (GLUCOTROL XL) 10 MG 24 hr tablet Take 10 mg by mouth daily with breakfast.    Historical Provider, MD  glucose blood test strip 1 each by Other route as needed. Onetouch ultra test strip     Historical Provider, MD  insulin aspart (NOVOLOG FLEXPEN) 100 UNIT/ML FlexPen Inject 6 Units into the skin See admin instructions. Per sliding scale 01/30/15   Historical Provider,  MD  insulin glargine (LANTUS) 100 UNIT/ML injection Inject 32 Units into the skin every evening.    Historical Provider, MD  IRON PO Take 2 tablets by mouth 3 (three) times a week.     Historical Provider, MD  LANTUS SOLOSTAR 100 UNIT/ML Solostar Pen Inject 20 Units into the skin 4 (four) times daily -  before meals and at bedtime. AS DIRECTED 02/27/14   Historical Provider, MD  lisinopril-hydrochlorothiazide (PRINZIDE,ZESTORETIC) 20-12.5 MG per tablet Take 1 tablet by mouth 2 (two) times daily. 11/26/14   Midge Minium, MD  metFORMIN (GLUCOPHAGE) 1000 MG tablet Take 1,000 mg by mouth 2 (two) times daily with a meal.    Historical Provider, MD  methimazole (TAPAZOLE) 5 MG tablet Take 7.5 mg by mouth every evening.  02/27/14   Historical Provider, MD  metoCLOPramide (REGLAN) 10 MG tablet Take 1 tablet (10 mg total) by mouth every 6 (six) hours as needed for nausea (nausea/headache). 12/11/14   Orpah Greek, MD  metoCLOPramide (REGLAN) 5 MG tablet Take 1 tablet (5 mg total) by mouth 3 (three) times daily before meals. 02/09/15   Thurnell Lose, MD  Multiple Vitamin (MULTIVITAMIN WITH MINERALS) TABS tablet Take 1 tablet by mouth every evening.     Historical Provider, MD  Omega-3 Fatty Acids (FISH OIL) 1000 MG CAPS Take 2,000 mg by mouth every evening.     Historical Provider, MD  ondansetron (ZOFRAN ODT) 4 MG disintegrating tablet Take 1 tablet (4 mg total) by mouth every 8 (eight) hours as needed for nausea or vomiting. 02/09/15   Thurnell Lose, MD  pantoprazole (PROTONIX) 40 MG tablet Take 1 tablet (40 mg total) by mouth daily. Patient taking differently: Take 40 mg by mouth 2 (two) times daily.  06/07/14   Midge Minium, MD  polyethylene glycol [facility] Floria Raveling) packet Take 17 g by mouth daily. 01/30/15   Historical Provider, MD  potassium chloride SA (K-DUR,KLOR-CON) 20 MEQ tablet Take 20 mEq by mouth daily as needed (for leg cramping). Take one tablet by mouth daily. 02/26/12    Midge Minium, MD  promethazine (PHENERGAN) 25 MG tablet Take 1 tablet (25 mg total) by mouth every 6 (six) hours as needed for nausea or vomiting. 02/16/15   Pattricia Boss, MD  rivaroxaban (XARELTO) 20 MG TABS tablet Take 20 mg by mouth daily. 07/02/14   Historical Provider, MD  sucralfate (CARAFATE) 1 G tablet Take 1 tablet (1 g total) by mouth 4 (four) times daily -  with meals and at bedtime. 12/20/14   Midge Minium, MD  Vitamin D, Ergocalciferol, (DRISDOL) 50000 UNITS CAPS capsule Take 50,000 Units by mouth every 7 (seven) days.    Historical Provider,  MD   BP 155/118 mmHg  Pulse 103  Temp(Src) 99 F (37.2 C) (Oral)  Resp 20  SpO2 100% Physical Exam  Constitutional: She is oriented to person, place, and time. She appears well-developed and well-nourished. No distress.  Patient is a 49 year old female who appears older than stated age.  HENT:  Head: Normocephalic and atraumatic.  Mouth/Throat: Oropharynx is clear and moist.  Neck: Normal range of motion. Neck supple.  Cardiovascular: Normal rate and regular rhythm.  Exam reveals no gallop and no friction rub.   No murmur heard. Pulmonary/Chest: Effort normal and breath sounds normal. No respiratory distress. She has no wheezes.  Abdominal: Soft. Bowel sounds are normal. She exhibits no distension. There is tenderness. There is no rebound and no guarding.  There is mild tenderness in the epigastric region.  Musculoskeletal: Normal range of motion.  Neurological: She is alert and oriented to person, place, and time.  Skin: Skin is warm and dry. She is not diaphoretic.  Nursing note and vitals reviewed.   ED Course  Procedures (including critical care time) Labs Review Labs Reviewed  CBC WITH DIFFERENTIAL/PLATELET  BASIC METABOLIC PANEL    Imaging Review No results found. I have personally reviewed and evaluated these images and lab results as part of my medical decision-making.   EKG Interpretation None       MDM   Final diagnoses:  None    Patient being treated for diabetic gastroparesis. Her laboratory studies are unremarkable. We will see how she responds to fluids and medications. If she is feeling better she will be discharged. Care will be signed out to Dr. Vallery Ridge at shift change.    Veryl Speak, MD 02/21/15 2027

## 2015-02-18 NOTE — ED Notes (Signed)
[facility]

## 2015-02-18 NOTE — Discharge Instructions (Signed)
Continue your medications as before.  Potassium as prescribed.  Return to the ER symptoms significantly worsen or change.   Hypokalemia Hypokalemia means that the amount of potassium in the blood is lower than normal.Potassium is a chemical, called an electrolyte, that helps regulate the amount of fluid in the body. It also stimulates muscle contraction and helps nerves function properly.Most of the body's potassium is inside of cells, and only a very small amount is in the blood. Because the amount in the blood is so small, minor changes can be life-threatening. CAUSES  Antibiotics.  Diarrhea or vomiting.  Using laxatives too much, which can cause diarrhea.  Chronic kidney disease.  Water pills (diuretics).  Eating disorders (bulimia).  Low magnesium level.  Sweating a lot. SIGNS AND SYMPTOMS  Weakness.  Constipation.  Fatigue.  Muscle cramps.  Mental confusion.  Skipped heartbeats or irregular heartbeat (palpitations).  Tingling or numbness. DIAGNOSIS  Your health care provider can diagnose hypokalemia with blood tests. In addition to checking your potassium level, your health care provider may also check other lab tests. TREATMENT Hypokalemia can be treated with potassium supplements taken by mouth or adjustments in your current medicines. If your potassium level is very low, you may need to get potassium through a vein (IV) and be monitored in the hospital. A diet high in potassium is also helpful. Foods high in potassium are:  Nuts, such as peanuts and pistachios.  Seeds, such as sunflower seeds and pumpkin seeds.  Peas, lentils, and lima beans.  Whole grain and bran cereals and breads.  Fresh fruit and vegetables, such as apricots, avocado, bananas, cantaloupe, kiwi, oranges, tomatoes, asparagus, and potatoes.  Orange and tomato juices.  Red meats.  Fruit yogurt. HOME CARE INSTRUCTIONS  Take all medicines as prescribed by your health care  provider.  Maintain a healthy diet by including nutritious food, such as fruits, vegetables, nuts, whole grains, and lean meats.  If you are taking a laxative, be sure to follow the directions on the label. SEEK MEDICAL CARE IF:  Your weakness gets worse.  You feel your heart pounding or racing.  You are vomiting or having diarrhea.  You are diabetic and having trouble keeping your blood glucose in the normal range. SEEK IMMEDIATE MEDICAL CARE IF:  You have chest pain, shortness of breath, or dizziness.  You are vomiting or having diarrhea for more than 2 days.  You faint. MAKE SURE YOU:   Understand these instructions.  Will watch your condition.  Will get help right away if you are not doing well or get worse.   This information is not intended to replace advice given to you by your health care provider. Make sure you discuss any questions you have with your health care provider.   Document Released: 04/06/2005 Document Revised: 04/27/2014 Document Reviewed: 10/07/2012 Elsevier Interactive Patient Education [employer].

## 2015-02-18 NOTE — ED Notes (Signed)
Here for abd pain and nv, described as a lot and continuous, here yesterday for the same, meds not helping.

## 2015-02-21 ENCOUNTER — Encounter: Payer: Self-pay | Admitting: Cardiology

## 2015-02-22 ENCOUNTER — Emergency Department (HOSPITAL_BASED_OUTPATIENT_CLINIC_OR_DEPARTMENT_OTHER)
Admission: EM | Admit: 2015-02-22 | Discharge: 2015-02-22 | Disposition: A | Discharge status: Home / Self Care | Payer: Medicaid Other | Attending: Emergency Medicine | Admitting: Emergency Medicine

## 2015-02-22 ENCOUNTER — Encounter (HOSPITAL_BASED_OUTPATIENT_CLINIC_OR_DEPARTMENT_OTHER): Payer: Self-pay | Admitting: *Deleted

## 2015-02-22 DIAGNOSIS — I4891 Unspecified atrial fibrillation: Secondary | ICD-10-CM | POA: Diagnosis not present

## 2015-02-22 DIAGNOSIS — Z79899 Other long term (current) drug therapy: Secondary | ICD-10-CM | POA: Insufficient documentation

## 2015-02-22 DIAGNOSIS — K3184 Gastroparesis: Secondary | ICD-10-CM

## 2015-02-22 DIAGNOSIS — J45909 Unspecified asthma, uncomplicated: Secondary | ICD-10-CM | POA: Diagnosis not present

## 2015-02-22 DIAGNOSIS — Z7951 Long term (current) use of inhaled steroids: Secondary | ICD-10-CM | POA: Insufficient documentation

## 2015-02-22 DIAGNOSIS — E079 Disorder of thyroid, unspecified: Secondary | ICD-10-CM | POA: Diagnosis not present

## 2015-02-22 DIAGNOSIS — Z85841 Personal history of malignant neoplasm of brain: Secondary | ICD-10-CM | POA: Diagnosis not present

## 2015-02-22 DIAGNOSIS — Z794 Long term (current) use of insulin: Secondary | ICD-10-CM | POA: Diagnosis not present

## 2015-02-22 DIAGNOSIS — E669 Obesity, unspecified: Secondary | ICD-10-CM | POA: Diagnosis not present

## 2015-02-22 DIAGNOSIS — Z7901 Long term (current) use of anticoagulants: Secondary | ICD-10-CM | POA: Insufficient documentation

## 2015-02-22 DIAGNOSIS — I10 Essential (primary) hypertension: Secondary | ICD-10-CM | POA: Insufficient documentation

## 2015-02-22 DIAGNOSIS — E1143 Type 2 diabetes mellitus with diabetic autonomic (poly)neuropathy: Secondary | ICD-10-CM

## 2015-02-22 DIAGNOSIS — Z7984 Long term (current) use of oral hypoglycemic drugs: Secondary | ICD-10-CM | POA: Insufficient documentation

## 2015-02-22 DIAGNOSIS — Z8669 Personal history of other diseases of the nervous system and sense organs: Secondary | ICD-10-CM | POA: Diagnosis not present

## 2015-02-22 DIAGNOSIS — E785 Hyperlipidemia, unspecified: Secondary | ICD-10-CM | POA: Insufficient documentation

## 2015-02-22 DIAGNOSIS — K219 Gastro-esophageal reflux disease without esophagitis: Secondary | ICD-10-CM | POA: Diagnosis not present

## 2015-02-22 DIAGNOSIS — F419 Anxiety disorder, unspecified: Secondary | ICD-10-CM | POA: Diagnosis not present

## 2015-02-22 DIAGNOSIS — R112 Nausea with vomiting, unspecified: Secondary | ICD-10-CM | POA: Diagnosis present

## 2015-02-22 LAB — COMPREHENSIVE METABOLIC PANEL
ALBUMIN: 4.2 g/dL (ref 3.5–5.0)
ALT: 30 U/L (ref 14–54)
ANION GAP: 15 (ref 5–15)
AST: 27 U/L (ref 15–41)
Alkaline Phosphatase: 97 U/L (ref 38–126)
BUN: 20 mg/dL (ref 6–20)
CALCIUM: 9.5 mg/dL (ref 8.9–10.3)
CHLORIDE: 96 mmol/L — AB (ref 101–111)
CO2: 27 mmol/L (ref 22–32)
Creatinine, Ser: 0.86 mg/dL (ref 0.44–1.00)
GFR calc non Af Amer: >60 mL/min (ref >60)
GLUCOSE: 255 mg/dL — AB (ref 65–99)
Potassium: 3 mmol/L — ABNORMAL LOW (ref 3.5–5.1)
SODIUM: 138 mmol/L (ref 135–145)
Total Bilirubin: 1.1 mg/dL (ref 0.3–1.2)
Total Protein: 8 g/dL (ref 6.5–8.1)

## 2015-02-22 LAB — CBC WITH DIFFERENTIAL/PLATELET
BASOS PCT: 0 %
Basophils Absolute: 0 10*3/uL (ref 0.0–0.1)
EOS ABS: 0 10*3/uL (ref 0.0–0.7)
EOS PCT: 1 %
HCT: 32.6 % — ABNORMAL LOW (ref 36.0–46.0)
HEMOGLOBIN: 10.8 g/dL — AB (ref 12.0–15.0)
LYMPHS ABS: 1.1 10*3/uL (ref 0.7–4.0)
Lymphocytes Relative: 20 %
MCH: 28.4 pg (ref 26.0–34.0)
MCHC: 33.1 g/dL (ref 30.0–36.0)
MCV: 85.8 fL (ref 78.0–100.0)
MONO ABS: 0.2 10*3/uL (ref 0.1–1.0)
MONOS PCT: 4 %
NEUTROS PCT: 75 %
Neutro Abs: 4.3 10*3/uL (ref 1.7–7.7)
PLATELETS: 356 10*3/uL (ref 150–400)
RBC: 3.8 MIL/uL — ABNORMAL LOW (ref 3.87–5.11)
RDW: 12.4 % (ref 11.5–15.5)
WBC: 5.7 10*3/uL (ref 4.0–10.5)

## 2015-02-22 LAB — LIPASE, BLOOD: Lipase: 41 U/L (ref 11–51)

## 2015-02-22 MED ORDER — DICYCLOMINE HCL 10 MG PO CAPS
10.0000 mg | ORAL_CAPSULE | Freq: Once | ORAL | Status: AC
  Administered 2015-02-22: 10 mg via ORAL
  Filled 2015-02-22: qty 1

## 2015-02-22 MED ORDER — ONDANSETRON 4 MG PO TBDP
4.0000 mg | ORAL_TABLET | Freq: Once | ORAL | Status: AC
  Administered 2015-02-22: 4 mg via ORAL

## 2015-02-22 MED ORDER — POTASSIUM CHLORIDE CRYS ER 20 MEQ PO TBCR
40.0000 meq | EXTENDED_RELEASE_TABLET | Freq: Two times a day (BID) | ORAL | Status: DC
  Administered 2015-02-22: 40 meq via ORAL
  Filled 2015-02-22: qty 2

## 2015-02-22 MED ORDER — HALOPERIDOL LACTATE 5 MG/ML IJ SOLN
5.0000 mg | Freq: Once | INTRAMUSCULAR | Status: AC
  Administered 2015-02-22: 5 mg via INTRAMUSCULAR

## 2015-02-22 MED ORDER — MORPHINE SULFATE (PF) 4 MG/ML IV SOLN
4.0000 mg | Freq: Once | INTRAVENOUS | Status: AC
  Administered 2015-02-22: 4 mg via INTRAVENOUS
  Filled 2015-02-22: qty 1

## 2015-02-22 MED ORDER — ONDANSETRON 4 MG PO TBDP
ORAL_TABLET | ORAL | Status: AC
  Administered 2015-02-22: 4 mg via ORAL
  Filled 2015-02-22: qty 1

## 2015-02-22 MED ORDER — CLONIDINE HCL 0.1 MG PO TABS
0.1000 mg | ORAL_TABLET | Freq: Once | ORAL | Status: AC
  Administered 2015-02-22: 0.1 mg via ORAL
  Filled 2015-02-22: qty 1

## 2015-02-22 MED ORDER — HALOPERIDOL LACTATE 5 MG/ML IJ SOLN
5.0000 mg | Freq: Once | INTRAMUSCULAR | Status: DC
  Filled 2015-02-22: qty 1

## 2015-02-22 MED ORDER — AMLODIPINE BESYLATE 5 MG PO TABS
5.0000 mg | ORAL_TABLET | Freq: Once | ORAL | Status: AC
  Administered 2015-02-22: 5 mg via ORAL
  Filled 2015-02-22: qty 1

## 2015-02-22 NOTE — ED Provider Notes (Signed)
CSN: [id number]     Arrival date & time 02/22/15  1038 History   First MD Initiated Contact with Patient 02/22/15 1312     Chief Complaint  Patient presents with  . Emesis     (Consider location/radiation/quality/duration/timing/severity/associated sxs/prior Treatment) Patient is a 49 y.o. female presenting with vomiting.  Emesis Severity:  Severe Duration:  1 day Timing:  Constant Number of daily episodes:  7-8 Quality:  Stomach contents Progression:  Unchanged Chronicity:  Recurrent Recent urination:  Normal Relieved by:  Antiemetics Worsened by:  Nothing tried Ineffective treatments:  None tried Associated symptoms: abdominal pain (epigastric, similar to other episodes)   Associated symptoms: no cough, no diarrhea, no fever, no headaches, no sore throat and no URI   Risk factors: diabetes (gastropresis)     Past Medical History  Diagnosis Date  . Asthma   . Diabetes mellitus   . Hyperlipidemia   . Hypertension   . GERD (gastroesophageal reflux disease)   . Cerebellar tumor ([place])   . Obesity   . Sleep apnea   . A-fib ([place])   . Thyroid disease   . Chest pain 03/27/2014    negative lexiscan myoview   Past Surgical History  Procedure Laterality Date  . Cholecystectomy    . Partial hysterectomy      ovaries remain  . Nasal sinus surgery      Dr. Lyn Hollingshead  . Abdominal hysterectomy     Family History  Problem Relation Age of Onset  . Coronary artery disease Father   . Hypertension Father   . Diabetes      maternal grandparents  . Hyperlipidemia Other    Social History  Substance Use Topics  . Smoking status: Never Smoker   . Smokeless tobacco: Never Used  . Alcohol Use: No   OB History    No data available     Review of Systems  Constitutional: Negative for fever.  HENT: Negative for sore throat.   Eyes: Negative for visual disturbance.  Respiratory: Negative for cough and shortness of breath.   Cardiovascular: Negative for chest pain.   Gastrointestinal: Positive for nausea, vomiting and abdominal pain (epigastric, similar to other episodes). Negative for diarrhea and constipation.  Genitourinary: Negative for vaginal bleeding, vaginal discharge and difficulty urinating.  Musculoskeletal: Negative for back pain and neck pain.  Skin: Negative for rash.  Neurological: Negative for syncope and headaches.      Allergies  Review of patient's allergies indicates no known allergies.  Home Medications   Prior to Admission medications   Medication Sig Start Date End Date Taking? Authorizing Provider  albuterol (PROVENTIL HFA;VENTOLIN HFA) 108 (90 BASE) MCG/ACT inhaler Inhale 2 puffs into the lungs every 4 (four) hours as needed for wheezing or shortness of breath.    Historical Provider, MD  ALPRAZolam Duanne Moron) 0.5 MG tablet Take 1 tablet (0.5 mg total) by mouth 2 (two) times daily as needed for anxiety. 10/29/14   Midge Minium, MD  amLODipine (NORVASC) 5 MG tablet Take 5 mg by mouth daily.    Historical Provider, MD  atenolol (TENORMIN) 100 MG tablet Take 1 tablet (100 mg total) by mouth every evening. 10/15/14   Midge Minium, MD  atenolol (TENORMIN) 25 MG tablet Take 1 tablet (25 mg total) by mouth daily as needed. 09/13/14   Sueanne Margarita, MD  atorvastatin (LIPITOR) 80 MG tablet Take 1 tablet (80 mg total) by mouth daily. 11/22/14   Sueanne Margarita, MD  azelastine Larry Sierras)  0.05 % ophthalmic solution Place 1 drop into both eyes 2 (two) times daily. 10/15/14   Midge Minium, MD  beclomethasone (QVAR) 80 MCG/ACT inhaler Inhale 3 puffs into the lungs daily. Patient taking differently: Inhale 3 puffs into the lungs daily as needed (wheezing).  10/12/14   Colon Branch, MD  canagliflozin (INVOKANA) 300 MG TABS tablet Take 300 mg by mouth daily before breakfast.    Historical Provider, MD  cetirizine (ZYRTEC) 10 MG tablet Take 1 tablet (10 mg total) by mouth daily. 10/15/14   Midge Minium, MD  cloNIDine (CATAPRES) 0.1 MG  tablet Take 1 tablet (0.1 mg total) by mouth 2 (two) times daily. 12/20/14   Midge Minium, MD  dicyclomine (BENTYL) 20 MG tablet Take 1 tablet (20 mg total) by mouth 2 (two) times daily. 12/12/14   Dorie Rank, MD  escitalopram (LEXAPRO) 10 MG tablet Take 1 tablet (10 mg total) by mouth daily. 10/15/14   Midge Minium, MD  glipiZIDE (GLUCOTROL XL) 10 MG 24 hr tablet Take 10 mg by mouth daily with breakfast.    Historical Provider, MD  glucose blood test strip 1 each by Other route as needed. Onetouch ultra test strip     Historical Provider, MD  insulin aspart (NOVOLOG FLEXPEN) 100 UNIT/ML FlexPen Inject 6 Units into the skin See admin instructions. Per sliding scale 01/30/15   Historical Provider, MD  insulin glargine (LANTUS) 100 UNIT/ML injection Inject 32 Units into the skin every evening.    Historical Provider, MD  IRON PO Take 2 tablets by mouth 3 (three) times a week.     Historical Provider, MD  LANTUS SOLOSTAR 100 UNIT/ML Solostar Pen Inject 20 Units into the skin 4 (four) times daily -  before meals and at bedtime. AS DIRECTED 02/27/14   Historical Provider, MD  lisinopril-hydrochlorothiazide (PRINZIDE,ZESTORETIC) 20-12.5 MG per tablet Take 1 tablet by mouth 2 (two) times daily. 11/26/14   Midge Minium, MD  metFORMIN (GLUCOPHAGE) 1000 MG tablet Take 1,000 mg by mouth 2 (two) times daily with a meal.    Historical Provider, MD  methimazole (TAPAZOLE) 5 MG tablet Take 7.5 mg by mouth every evening.  02/27/14   Historical Provider, MD  metoCLOPramide (REGLAN) 10 MG tablet Take 1 tablet (10 mg total) by mouth every 6 (six) hours as needed for nausea (nausea/headache). 12/11/14   Orpah Greek, MD  metoCLOPramide (REGLAN) 5 MG tablet Take 1 tablet (5 mg total) by mouth 3 (three) times daily before meals. 02/09/15   Thurnell Lose, MD  Multiple Vitamin (MULTIVITAMIN WITH MINERALS) TABS tablet Take 1 tablet by mouth every evening.     Historical Provider, MD  Omega-3 Fatty Acids  (FISH OIL) 1000 MG CAPS Take 2,000 mg by mouth every evening.     Historical Provider, MD  ondansetron (ZOFRAN ODT) 4 MG disintegrating tablet Take 1 tablet (4 mg total) by mouth every 8 (eight) hours as needed for nausea or vomiting. 02/09/15   Thurnell Lose, MD  pantoprazole (PROTONIX) 40 MG tablet Take 1 tablet (40 mg total) by mouth daily. Patient taking differently: Take 40 mg by mouth 2 (two) times daily.  06/07/14   Midge Minium, MD  polyethylene glycol [facility] / Floria Raveling) packet Take 17 g by mouth daily. 01/30/15   Historical Provider, MD  potassium chloride (K-DUR) 10 MEQ tablet Take 2 tablets (20 mEq total) by mouth 2 (two) times daily. 02/18/15   Veryl Speak, MD  potassium chloride  SA (K-DUR,KLOR-CON) 20 MEQ tablet Take 20 mEq by mouth daily as needed (for leg cramping). Take one tablet by mouth daily. 02/26/12   Midge Minium, MD  promethazine (PHENERGAN) 25 MG tablet Take 1 tablet (25 mg total) by mouth every 6 (six) hours as needed for nausea or vomiting. 02/16/15   Pattricia Boss, MD  rivaroxaban (XARELTO) 20 MG TABS tablet Take 20 mg by mouth daily. 07/02/14   Historical Provider, MD  sucralfate (CARAFATE) 1 G tablet Take 1 tablet (1 g total) by mouth 4 (four) times daily -  with meals and at bedtime. 12/20/14   Midge Minium, MD  Vitamin D, Ergocalciferol, (DRISDOL) 50000 UNITS CAPS capsule Take 50,000 Units by mouth every 7 (seven) days.    Historical Provider, MD   BP 161/84 mmHg  Pulse 96  Temp(Src) 98 F (36.7 C) (Oral)  Resp 16  Ht 5\' 1"  (1.549 m)  Wt 160 lb (72.576 kg)  BMI 30.25 kg/m2  SpO2 99% Physical Exam  Constitutional: She is oriented to person, place, and time. She appears well-developed and well-nourished. No distress.  HENT:  Head: Normocephalic and atraumatic.  Eyes: Conjunctivae and EOM are normal.  Neck: Normal range of motion.  Cardiovascular: Normal rate, regular rhythm, normal heart sounds and intact distal pulses.  Exam reveals no  gallop and no friction rub.   No murmur heard. Pulmonary/Chest: Effort normal and breath sounds normal. No respiratory distress. She has no wheezes. She has no rales.  Abdominal: Soft. She exhibits no distension. There is tenderness (mild epigastric). There is no guarding.  Musculoskeletal: She exhibits no edema or tenderness.  Neurological: She is alert and oriented to person, place, and time.  Skin: Skin is warm and dry. No rash noted. She is not diaphoretic. No erythema.  Nursing note and vitals reviewed.   ED Course  Procedures (including critical care time) Labs Review Labs Reviewed  CBC WITH DIFFERENTIAL/PLATELET - Abnormal; Notable for the following:    RBC 3.80 (*)    Hemoglobin 10.8 (*)    HCT 32.6 (*)    All other components within normal limits  COMPREHENSIVE METABOLIC PANEL - Abnormal; Notable for the following:    Potassium 3.0 (*)    Chloride 96 (*)    Glucose, Bld 255 (*)    All other components within normal limits  LIPASE, BLOOD    Imaging Review No results found. I have personally reviewed and evaluated these images and lab results as part of my medical decision-making.   EKG Interpretation None      MDM   Final diagnoses:  Diabetic gastroparesis ([place])   49yo female with history of htn, hlpd, asthma, DM, PAF, anxiety, hyperthyroidism, gastroparesis with frequent ED visits presents with concern for nausea and vomiting.  Patient was just at [facility] for gastric emptying study however was unable to complete it secondary to vomiting and sister drove her here for treatment.  Patient states symptoms are exactly the same as prior episodes of gastroparesis.   No sign of pancreatitis/hepatitis/SBO.  Likely flare of chronic abdominal pand and emesis. Given haldol and one dose of morphine with improvement. Hypokalemia improved from days ago to 3 from 2.6 and pt given additional K replacement. Patient discharged in stable condition with understanding of reasons to  return.      Gareth Morgan, MD 02/23/15 1016

## 2015-02-22 NOTE — ED Notes (Signed)
Patient states she was at [facility] having a GI test when she developed nausea and vomiting.  States the test was stopped and the patient left. C/O mid abdominal pain and vomiting.

## 2015-02-22 NOTE — ED Notes (Signed)
Pt upset due to wait time for EDP, explained to patient that EDP has to evaluate her before any additional medication can be given. Updated EDP, notified pt EDP will see her next.

## 2015-02-22 NOTE — ED Notes (Signed)
Pt moaning, states "I have gastroparesis, they usually give Haldol and Pain Medicine."

## 2015-02-27 ENCOUNTER — Emergency Department (HOSPITAL_BASED_OUTPATIENT_CLINIC_OR_DEPARTMENT_OTHER)
Admission: EM | Admit: 2015-02-27 | Discharge: 2015-02-27 | Disposition: A | Discharge status: Home / Self Care | Payer: Medicaid Other | Attending: Emergency Medicine | Admitting: Emergency Medicine

## 2015-02-27 ENCOUNTER — Encounter (HOSPITAL_BASED_OUTPATIENT_CLINIC_OR_DEPARTMENT_OTHER): Payer: Self-pay

## 2015-02-27 DIAGNOSIS — Z7951 Long term (current) use of inhaled steroids: Secondary | ICD-10-CM | POA: Insufficient documentation

## 2015-02-27 DIAGNOSIS — R112 Nausea with vomiting, unspecified: Secondary | ICD-10-CM | POA: Diagnosis present

## 2015-02-27 DIAGNOSIS — Z794 Long term (current) use of insulin: Secondary | ICD-10-CM | POA: Insufficient documentation

## 2015-02-27 DIAGNOSIS — I10 Essential (primary) hypertension: Secondary | ICD-10-CM | POA: Diagnosis not present

## 2015-02-27 DIAGNOSIS — E669 Obesity, unspecified: Secondary | ICD-10-CM | POA: Diagnosis not present

## 2015-02-27 DIAGNOSIS — Z79899 Other long term (current) drug therapy: Secondary | ICD-10-CM | POA: Insufficient documentation

## 2015-02-27 DIAGNOSIS — K219 Gastro-esophageal reflux disease without esophagitis: Secondary | ICD-10-CM | POA: Diagnosis not present

## 2015-02-27 DIAGNOSIS — J45909 Unspecified asthma, uncomplicated: Secondary | ICD-10-CM | POA: Insufficient documentation

## 2015-02-27 DIAGNOSIS — Z8669 Personal history of other diseases of the nervous system and sense organs: Secondary | ICD-10-CM | POA: Diagnosis not present

## 2015-02-27 DIAGNOSIS — E785 Hyperlipidemia, unspecified: Secondary | ICD-10-CM | POA: Insufficient documentation

## 2015-02-27 DIAGNOSIS — K3184 Gastroparesis: Secondary | ICD-10-CM

## 2015-02-27 DIAGNOSIS — I4891 Unspecified atrial fibrillation: Secondary | ICD-10-CM | POA: Insufficient documentation

## 2015-02-27 DIAGNOSIS — Z86011 Personal history of benign neoplasm of the brain: Secondary | ICD-10-CM | POA: Diagnosis not present

## 2015-02-27 DIAGNOSIS — E1143 Type 2 diabetes mellitus with diabetic autonomic (poly)neuropathy: Secondary | ICD-10-CM | POA: Diagnosis not present

## 2015-02-27 LAB — CBC WITH DIFFERENTIAL/PLATELET
BASOS ABS: 0 10*3/uL (ref 0.0–0.1)
Basophils Relative: 0 %
Eosinophils Absolute: 0 10*3/uL (ref 0.0–0.7)
Eosinophils Relative: 0 %
HEMATOCRIT: 35.3 % — AB (ref 36.0–46.0)
Hemoglobin: 11.7 g/dL — ABNORMAL LOW (ref 12.0–15.0)
LYMPHS ABS: 1.3 10*3/uL (ref 0.7–4.0)
LYMPHS PCT: 23 %
MCH: 28.5 pg (ref 26.0–34.0)
MCHC: 33.1 g/dL (ref 30.0–36.0)
MCV: 85.9 fL (ref 78.0–100.0)
MONO ABS: 0.3 10*3/uL (ref 0.1–1.0)
Monocytes Relative: 5 %
NEUTROS ABS: 4.1 10*3/uL (ref 1.7–7.7)
Neutrophils Relative %: 72 %
Platelets: 417 10*3/uL — ABNORMAL HIGH (ref 150–400)
RBC: 4.11 MIL/uL (ref 3.87–5.11)
RDW: 12.4 % (ref 11.5–15.5)
WBC: 5.8 10*3/uL (ref 4.0–10.5)

## 2015-02-27 LAB — LIPASE, BLOOD: LIPASE: 39 U/L (ref 11–51)

## 2015-02-27 LAB — COMPREHENSIVE METABOLIC PANEL
ALT: 35 U/L (ref 14–54)
ANION GAP: 15 (ref 5–15)
AST: 28 U/L (ref 15–41)
Albumin: 4.4 g/dL (ref 3.5–5.0)
Alkaline Phosphatase: 116 U/L (ref 38–126)
BUN: 19 mg/dL (ref 6–20)
CALCIUM: 10 mg/dL (ref 8.9–10.3)
CHLORIDE: 94 mmol/L — AB (ref 101–111)
CO2: 29 mmol/L (ref 22–32)
Creatinine, Ser: 0.83 mg/dL (ref 0.44–1.00)
GFR calc non Af Amer: >60 mL/min (ref >60)
Glucose, Bld: 252 mg/dL — ABNORMAL HIGH (ref 65–99)
Potassium: 3.1 mmol/L — ABNORMAL LOW (ref 3.5–5.1)
SODIUM: 138 mmol/L (ref 135–145)
Total Bilirubin: 0.8 mg/dL (ref 0.3–1.2)
Total Protein: 8.2 g/dL — ABNORMAL HIGH (ref 6.5–8.1)

## 2015-02-27 MED ORDER — LORAZEPAM 2 MG/ML IJ SOLN
1.0000 mg | Freq: Once | INTRAMUSCULAR | Status: AC
  Administered 2015-02-27: 1 mg via INTRAVENOUS
  Filled 2015-02-27: qty 1

## 2015-02-27 MED ORDER — SODIUM CHLORIDE 0.9 % IV BOLUS (SEPSIS)
500.0000 mL | Freq: Once | INTRAVENOUS | Status: AC
  Administered 2015-02-27: 500 mL via INTRAVENOUS

## 2015-02-27 MED ORDER — HALOPERIDOL LACTATE 5 MG/ML IJ SOLN
5.0000 mg | Freq: Once | INTRAMUSCULAR | Status: AC
  Administered 2015-02-27: 5 mg via INTRAVENOUS

## 2015-02-27 MED ORDER — POTASSIUM CHLORIDE CRYS ER 20 MEQ PO TBCR
80.0000 meq | EXTENDED_RELEASE_TABLET | Freq: Once | ORAL | Status: AC
Start: 2015-02-27 — End: 2015-02-27
  Administered 2015-02-27: 80 meq via ORAL
  Filled 2015-02-27: qty 4

## 2015-02-27 MED ORDER — SODIUM CHLORIDE 0.9 % IV BOLUS (SEPSIS)
1000.0000 mL | Freq: Once | INTRAVENOUS | Status: AC
  Administered 2015-02-27: 1000 mL via INTRAVENOUS

## 2015-02-27 MED ORDER — ONDANSETRON HCL 4 MG/2ML IJ SOLN: 4.0000 mg | Freq: Once | INTRAMUSCULAR | Status: DC

## 2015-02-27 MED ORDER — HALOPERIDOL LACTATE 5 MG/ML IJ SOLN
10.0000 mg | Freq: Once | INTRAMUSCULAR | Status: DC
Start: 2015-02-27 — End: 2015-02-27
  Filled 2015-02-27: qty 2

## 2015-02-27 MED ORDER — HALOPERIDOL LACTATE 5 MG/ML IJ SOLN: 5.0000 mg | Freq: Once | INTRAMUSCULAR | Status: DC

## 2015-02-27 NOTE — ED Provider Notes (Signed)
CSN: [id number]     Arrival date & time 02/27/15  1225 History   First MD Initiated Contact with Patient 02/27/15 1312     Chief Complaint  Patient presents with  . Emesis     (Consider location/radiation/quality/duration/timing/severity/associated sxs/prior Treatment) Patient is a 49 y.o. female presenting with vomiting. The history is provided by the patient and medical records. No language interpreter was used.  Emesis Associated symptoms: abdominal pain   Associated symptoms: no diarrhea      Crystal Peck is a 49 y.o. female  with a hx of asthma, HTN, GERD, IDDM, gastroparesis presents to the Emergency Department complaining of gradual, persistent, progressively worsening nausea and vomiting onset this morning around 7am.  Pt reports NBNB emesis of stomach contents.  No fever, chills, diarrhea, sick contacts.  Pt reports taking all her medications this morning.  She reports today's symptoms are similar to previous episodes.  She has associated epigastric abd pain, rated at an 8/10 and described as achy. Pt reports taking reglan and zofran at home without relief.  Pt denies headache, neck pain, chest pain, SOB, weakness, dizziness, syncope.      Past Medical History  Diagnosis Date  . Asthma   . Diabetes mellitus   . Hyperlipidemia   . Hypertension   . GERD (gastroesophageal reflux disease)   . Cerebellar tumor ([place])   . Obesity   . Sleep apnea   . A-fib ([place])   . Thyroid disease   . Chest pain 03/27/2014    negative lexiscan myoview   Past Surgical History  Procedure Laterality Date  . Cholecystectomy    . Partial hysterectomy      ovaries remain  . Nasal sinus surgery      Dr. Lyn Hollingshead  . Abdominal hysterectomy     Family History  Problem Relation Age of Onset  . Coronary artery disease Father   . Hypertension Father   . Diabetes      maternal grandparents  . Hyperlipidemia Other    Social History  Substance Use Topics  . Smoking status: Never Smoker   .  Smokeless tobacco: Never Used  . Alcohol Use: No   OB History    No data available     Review of Systems  Constitutional: Negative for fever, diaphoresis, appetite change, fatigue and unexpected weight change.  HENT: Negative for mouth sores and trouble swallowing.   Respiratory: Negative for cough, chest tightness, shortness of breath, wheezing and stridor.   Cardiovascular: Negative for chest pain and palpitations.  Gastrointestinal: Positive for nausea, vomiting and abdominal pain. Negative for diarrhea, constipation, blood in stool, abdominal distention and rectal pain.  Genitourinary: Negative for dysuria, urgency, frequency, hematuria, flank pain and difficulty urinating.  Musculoskeletal: Negative for back pain, neck pain and neck stiffness.  Skin: Negative for rash.  Neurological: Negative for weakness.  Hematological: Negative for adenopathy.  Psychiatric/Behavioral: Negative for confusion.  All other systems reviewed and are negative.     Allergies  Review of patient's allergies indicates no known allergies.  Home Medications   Prior to Admission medications   Medication Sig Start Date End Date Taking? Authorizing Provider  albuterol (PROVENTIL HFA;VENTOLIN HFA) 108 (90 BASE) MCG/ACT inhaler Inhale 2 puffs into the lungs every 4 (four) hours as needed for wheezing or shortness of breath.    Historical Provider, MD  ALPRAZolam Duanne Moron) 0.5 MG tablet Take 1 tablet (0.5 mg total) by mouth 2 (two) times daily as needed for anxiety. 10/29/14  Midge Minium, MD  amLODipine (NORVASC) 5 MG tablet Take 5 mg by mouth daily.    Historical Provider, MD  atenolol (TENORMIN) 100 MG tablet Take 1 tablet (100 mg total) by mouth every evening. 10/15/14   Midge Minium, MD  atenolol (TENORMIN) 25 MG tablet Take 1 tablet (25 mg total) by mouth daily as needed. 09/13/14   Sueanne Margarita, MD  atorvastatin (LIPITOR) 80 MG tablet Take 1 tablet (80 mg total) by mouth daily. 11/22/14    Sueanne Margarita, MD  azelastine (OPTIVAR) 0.05 % ophthalmic solution Place 1 drop into both eyes 2 (two) times daily. 10/15/14   Midge Minium, MD  beclomethasone (QVAR) 80 MCG/ACT inhaler Inhale 3 puffs into the lungs daily. Patient taking differently: Inhale 3 puffs into the lungs daily as needed (wheezing).  10/12/14   Colon Branch, MD  canagliflozin (INVOKANA) 300 MG TABS tablet Take 300 mg by mouth daily before breakfast.    Historical Provider, MD  cetirizine (ZYRTEC) 10 MG tablet Take 1 tablet (10 mg total) by mouth daily. 10/15/14   Midge Minium, MD  cloNIDine (CATAPRES) 0.1 MG tablet Take 1 tablet (0.1 mg total) by mouth 2 (two) times daily. 12/20/14   Midge Minium, MD  dicyclomine (BENTYL) 20 MG tablet Take 1 tablet (20 mg total) by mouth 2 (two) times daily. 12/12/14   Dorie Rank, MD  escitalopram (LEXAPRO) 10 MG tablet Take 1 tablet (10 mg total) by mouth daily. 10/15/14   Midge Minium, MD  glipiZIDE (GLUCOTROL XL) 10 MG 24 hr tablet Take 10 mg by mouth daily with breakfast.    Historical Provider, MD  glucose blood test strip 1 each by Other route as needed. Onetouch ultra test strip     Historical Provider, MD  insulin aspart (NOVOLOG FLEXPEN) 100 UNIT/ML FlexPen Inject 6 Units into the skin See admin instructions. Per sliding scale 01/30/15   Historical Provider, MD  insulin glargine (LANTUS) 100 UNIT/ML injection Inject 32 Units into the skin every evening.    Historical Provider, MD  IRON PO Take 2 tablets by mouth 3 (three) times a week.     Historical Provider, MD  LANTUS SOLOSTAR 100 UNIT/ML Solostar Pen Inject 20 Units into the skin 4 (four) times daily -  before meals and at bedtime. AS DIRECTED 02/27/14   Historical Provider, MD  lisinopril-hydrochlorothiazide (PRINZIDE,ZESTORETIC) 20-12.5 MG per tablet Take 1 tablet by mouth 2 (two) times daily. 11/26/14   Midge Minium, MD  metFORMIN (GLUCOPHAGE) 1000 MG tablet Take 1,000 mg by mouth 2 (two) times daily with a  meal.    Historical Provider, MD  methimazole (TAPAZOLE) 5 MG tablet Take 7.5 mg by mouth every evening.  02/27/14   Historical Provider, MD  metoCLOPramide (REGLAN) 10 MG tablet Take 1 tablet (10 mg total) by mouth every 6 (six) hours as needed for nausea (nausea/headache). 12/11/14   Orpah Greek, MD  metoCLOPramide (REGLAN) 5 MG tablet Take 1 tablet (5 mg total) by mouth 3 (three) times daily before meals. 02/09/15   Thurnell Lose, MD  Multiple Vitamin (MULTIVITAMIN WITH MINERALS) TABS tablet Take 1 tablet by mouth every evening.     Historical Provider, MD  Omega-3 Fatty Acids (FISH OIL) 1000 MG CAPS Take 2,000 mg by mouth every evening.     Historical Provider, MD  ondansetron (ZOFRAN ODT) 4 MG disintegrating tablet Take 1 tablet (4 mg total) by mouth every 8 (eight) hours as  needed for nausea or vomiting. 02/09/15   Thurnell Lose, MD  pantoprazole (PROTONIX) 40 MG tablet Take 1 tablet (40 mg total) by mouth daily. Patient taking differently: Take 40 mg by mouth 2 (two) times daily.  06/07/14   Midge Minium, MD  polyethylene glycol [facility] / Floria Raveling) packet Take 17 g by mouth daily. 01/30/15   Historical Provider, MD  potassium chloride (K-DUR) 10 MEQ tablet Take 2 tablets (20 mEq total) by mouth 2 (two) times daily. 02/18/15   Veryl Speak, MD  potassium chloride SA (K-DUR,KLOR-CON) 20 MEQ tablet Take 20 mEq by mouth daily as needed (for leg cramping). Take one tablet by mouth daily. 02/26/12   Midge Minium, MD  promethazine (PHENERGAN) 25 MG tablet Take 1 tablet (25 mg total) by mouth every 6 (six) hours as needed for nausea or vomiting. 02/16/15   Pattricia Boss, MD  rivaroxaban (XARELTO) 20 MG TABS tablet Take 20 mg by mouth daily. 07/02/14   Historical Provider, MD  sucralfate (CARAFATE) 1 G tablet Take 1 tablet (1 g total) by mouth 4 (four) times daily -  with meals and at bedtime. 12/20/14   Midge Minium, MD  Vitamin D, Ergocalciferol, (DRISDOL) 50000 UNITS CAPS  capsule Take 50,000 Units by mouth every 7 (seven) days.    Historical Provider, MD   BP 169/87 mmHg  Pulse 54  Temp(Src) 97.8 F (36.6 C) (Axillary)  Resp 18  Ht 5\' 1"  (1.549 m)  Wt 160 lb (72.576 kg)  BMI 30.25 kg/m2  SpO2 100% Physical Exam  Constitutional: She appears well-developed and well-nourished. No distress.  Awake, alert, nontoxic appearance  HENT:  Head: Normocephalic and atraumatic.  Mouth/Throat: Oropharynx is clear and moist. No oropharyngeal exudate.  Eyes: Conjunctivae are normal. No scleral icterus.  Neck: Normal range of motion. Neck supple.  Cardiovascular: Normal rate, regular rhythm, normal heart sounds and intact distal pulses.   Pulmonary/Chest: Effort normal and breath sounds normal. No respiratory distress. She has no wheezes.  Equal chest expansion  Abdominal: Soft. Bowel sounds are normal. She exhibits no distension and no mass. There is no tenderness. There is no rebound and no guarding.  Musculoskeletal: Normal range of motion. She exhibits no edema.  Neurological: She is alert.  Speech is clear and goal oriented Moves extremities without ataxia  Skin: Skin is warm and dry. She is not diaphoretic.  Psychiatric: She has a normal mood and affect.  Nursing note and vitals reviewed.   ED Course  Procedures (including critical care time) Labs Review Labs Reviewed  COMPREHENSIVE METABOLIC PANEL - Abnormal; Notable for the following:    Potassium 3.1 (*)    Chloride 94 (*)    Glucose, Bld 252 (*)    Total Protein 8.2 (*)    All other components within normal limits  CBC WITH DIFFERENTIAL/PLATELET - Abnormal; Notable for the following:    Hemoglobin 11.7 (*)    HCT 35.3 (*)    Platelets 417 (*)    All other components within normal limits  LIPASE, BLOOD    Imaging Review No results found. I have personally reviewed and evaluated these images and lab results as part of my medical decision-making.   EKG Interpretation None      MDM    Final diagnoses:  Diabetic gastroparesis ([place])  Non-intractable vomiting with nausea, vomiting of unspecified type   Crystal Peck with a history of gastroparesis and insulin-dependent diabetes presents emergency department with nausea and vomiting. Patient is  well-known for symptoms similar to this. She reports her pain is unchanged from previous episodes. She reports that Haldol works best for her.  4:20 PM No further emesis in the emergency department her she continues to complain of significant nausea. Will give Ativan.  Hypokalemia noted on her CMP, repleted orally here in the emergency department.  5:35 PM Patient without further emesis in the department. She has tolerated fluids, crackers and her potassium without difficulty. On a repeat exam her abdomen is soft and nontender. She reports she feels well and would like to be discharged home. Labs and vital signs are reassuring. Encouraged follow-up with her primary care physician.  BP 169/87 mmHg  Pulse 54  Temp(Src) 97.8 F (36.6 C) (Axillary)  Resp 18  Ht 5\' 1"  (1.549 m)  Wt 160 lb (72.576 kg)  BMI 30.25 kg/m2  SpO2 100%   Abigail Butts, PA-C 02/27/15 [place] Liu, MD 02/27/15 1954

## 2015-02-27 NOTE — Discharge Instructions (Signed)
1. Medications: usual home medications 2. Treatment: rest, drink plenty of fluids, advance diet slowly 3. Follow Up: Please followup with your primary doctor in 2 days for discussion of your diagnoses and further evaluation after today's visit; if you do not have a primary care doctor use the resource guide provided to find one; Please return to the ER for persistent vomiting, high fevers or worsening symptoms   Emergency Department Resource Guide 1) Find a Doctor and Pay Out of Pocket Although you won't have to find out who is covered by your insurance plan, it is a good idea to ask around and get recommendations. You will then need to call the office and see if the doctor you have chosen will accept you as a new patient and what types of options they offer for patients who are self-pay. Some doctors offer discounts or will set up payment plans for their patients who do not have insurance, but you will need to ask so you aren't surprised when you get to your appointment.  2) Contact Your Local Health Department Not all health departments have doctors that can see patients for sick visits, but many do, so it is worth a call to see if yours does. If you don't know where your local health department is, you can check in your phone book. The CDC also has a tool to help you locate your state's health department, and many state websites also have listings of all of their local health departments.  3) Find a [facility] If your illness is not likely to be very severe or complicated, you may want to try a walk in clinic. These are popping up all over the country in pharmacies, drugstores, and shopping centers. They're usually staffed by nurse practitioners or physician assistants that have been trained to treat common illnesses and complaints. They're usually fairly quick and inexpensive. However, if you have serious medical issues or chronic medical problems, these are probably not your best option.  No  Primary Care Doctor: - Call Health Connect at  [phone] - they can help you locate a primary care doctor that  accepts your insurance, provides certain services, etc. - Physician Referral Service- [phone]  Chronic Pain Problems: Organization         Address  Phone   Notes  [facility]  [phone] Patients need to be referred by their primary care doctor.   Medication Assistance: Organization         Address  Phone   Notes  [facility] [address] [place], [place] [address] [phone] --Must be a resident of [facility] -- Must have NO insurance coverage whatsoever (no Medicaid/ Medicare, etc.) -- The pt. MUST have a primary care doctor that directs their care regularly and follows them in the community   MedAssist  [phone]   [employer]  [phone]    Agencies that provide inexpensive medical care: Organization         Address  Phone   Notes  [place]  [phone]   [facility]    [phone]   [facility] [place], [place] [address] [phone]   [place] [phone]   Planned Parenthood    [phone]   [facility]    [phone]   [facility]  [address], [place] Phone:  [phone], Fax:  [phone] Hours of Operation:  9 am - 6 pm, M-F.  Also accepts Medicaid/Medicare and self-pay.  [facility] for [place], [address], [place] Phone: [phone], Fax: [phone]. Hours of Operation:  8:30 am - 5:30 pm, M-F.  Also accepts Medicaid and self-pay.  [facility] [place] [address], [place] Phone: [phone]   [place], [place], [place] [phone], Ext. [phone] Mondays & Thursdays: 7-9 AM.  First 15 patients are seen on  a first come, first serve basis.    Pray Providers:  Organization         Address  Phone   Notes  [facility] [address],  [phone] Also accepts self-pay patients.  [facility] [address], [place]  [phone]   [place], [address], [place] [phone]   [facility] [address], [place] [phone]   Lucianne Lei [address], [place]   [phone] Only accepts [place] Access [place] patients after they have their name applied to their card.   Self-Pay (no insurance) in [facility]:  Organization         Address  Phone   Notes  Sickle Cell Patients, [facility] [place] [phone]   [facility] [place] [phone]   [facility] [place]  [place], [place], [place] [phone]   [facility]/Dr. Osei-Bonsu  [address] [place] or [address], [place] [phone] Phone number for both [place] and [place] locations is the same.  [facility]gent [facility] and [facility] [address] [place] [phone]   [facility] [address] [place] or [address] [phone]   [facility] [address] [place] [phone], phone; [phone], fax Sees patients 1st and 3rd Saturday of every month.  Must not qualify for public or private insurance (i.e. Medicaid, Medicare, [place] Health Choice, Veterans' Benefits)  Household income should be no more than 200% of the poverty level The clinic cannot treat you if you are pregnant or think you are pregnant  Sexually transmitted diseases are not treated at the clinic.    Dental Care: Organization          Address  Phone  Notes  [facility] Department of [facility] [place] [phone] Accepts children up to age 4 who are enrolled in [place] or [place]; pregnant women with a Medicaid card; and children who have applied for Medicaid or [place] Health Choice, but were declined, whose parents can pay a reduced fee at time of service.  [facility] Department of [facility]  [address], [place] [phone] Accepts children up to age 69 who are enrolled in [place] or [place]; pregnant women with a Medicaid card; and children who have applied for Medicaid or [place] Health Choice, but were declined, whose parents can pay a reduced fee at time of service.  [facility] Adult Dental Access PROGRAM  [place], [place] [phone]  Patients are seen by appointment only. Walk-ins are not accepted. [place] will see patients 62 years of age and older. Monday - Tuesday (8am-5pm) Most Wednesdays (8:30-5pm) $30 per visit, cash only  [facility] Adult Dental Access PROGRAM  [address], [facility] [phone] Patients are seen by appointment only. Walk-ins are not accepted. [place] will see patients 76 years of age and older. One Wednesday Evening (Monthly: Volunteer Based).  $30 per visit, cash only  [place]  [phone] for adults; Children under age 48, call Graduate Pediatric Dentistry at [phone]. Children aged 49-14, please call [phone] to request a pediatric application.  Dental services are provided in all areas of dental care including fillings, crowns and bridges, complete and partial dentures, implants, gum treatment, root canals, and extractions. Preventive care is also provided. Treatment is provided to both adults and children. Patients are selected via a lottery and there is often a waiting list.   [facility] [address] [place]  [phone] [email or web address]   [facility] [address] [place], [place] [phone], Ext. [phone] Second and Fourth Thursday of each month, opens at 6:30 AM; Clinic ends at 9 AM.  Patients are seen on a first-come first-served basis, and a limited number are seen during each clinic.   [facility]  [address] Hillard Danker [place], [place] [phone]   Eligibility Requirements You must have lived in [place], [place], or [place] counties for at least the last three months.   You cannot be eligible for state or federal sponsored [employer], including [employer], [place], or [employer].   You generally cannot be eligible for healthcare insurance through your employer.    How to apply: Eligibility screenings are held every Tuesday and Wednesday afternoon from 1:00 pm until 4:00 pm. You do not need an appointment for the interview!  [facility] [address] [place], [place]   [place]  [place] Department  [place]  [phone]    Behavioral Health Resources in the Community: Intensive Outpatient Programs Organization         Address  Phone  Notes  [place]. [address] [place], [place] [phone]   [facility] Outpatient [address], [place], [place]   ADS: Alcohol & Drug Svcs [address] [place], [place]   [place] [address],  [place], [place] or [phone]   Substance Abuse Resources Organization         Address  Phone  Notes  Alcohol and Drug Services  [phone]   [place]  [phone]   The [place]   Chinita Pester  [phone]   Residential & Outpatient Substance Abuse Program  [phone]   Psychological  Services Organization         Address  Phone  Notes  [facility] [place]  [place]  [phone]   [place] [address], [place] or [phone]    Mobile Crisis Teams Organization         Address  Phone  Notes  [facility] Alternatives, Mobile Crisis Care Unit  [phone]   Assertive Psychotherapeutic Services  [address] [place], [place]   [facility] [address], [place]  [place] [phone]    Self-Help/Support Groups Organization         Address  Phone             Notes  [facility] [place] - variety of support groups  [place] Call for more information  Narcotics Anonymous (NA), [facility] [address], [employer] [place]  2 meetings at this location   Special educational needs teacher         Address  Phone  Notes  ASAP Residential Treatment [place],    [place]  [phone]   [facility]  [address] [place] [id number], [place], [place]   [place], [place] [phone] Admissions: 8am-3pm M-F  Incentives Substance [place] [address],    [place], [place] [phone]   [facility] [address] [place], [place], [place]   [facility] [address]  [place], [place]   [facility] Programs - Intensive Outpatient [address] Kristeen Mans [address], [place], [place]   [facility] ([place].,  [place], [place] [phone] or [phone]   [facility]idential Treatment Services (RTS) [address], [place], [place] Accepts Medicaid  [facility] [place] [address],  [place] [phone] Substance Abuse/Addiction Treatment   [facility] Organization         Address  Phone  Notes  [facility]  [phone]   Domenic Schwab, PhD [address] Arlis Porta [place], [place]   [phone] or [phone]   [place], [place] [phone]   [facility] [address], [place], [place] [phone] Insurance/Medicaid/sponsorship through [facility] and Families [address] [place]                                    [place], [place] [phone] [place] [address], [place] [phone]    Dr. Adele Schilder  [phone]   [facility] Dept. 1) [address],  2) [place] 3)  [place] [address], [place] [phone]  [phone]   [place] [phone] or [phone] (After Hours)

## 2015-02-27 NOTE — ED Notes (Signed)
C/o vomiting since 7am

## 2015-03-07 ENCOUNTER — Encounter (HOSPITAL_BASED_OUTPATIENT_CLINIC_OR_DEPARTMENT_OTHER): Payer: Self-pay | Admitting: *Deleted

## 2015-03-07 ENCOUNTER — Emergency Department (HOSPITAL_BASED_OUTPATIENT_CLINIC_OR_DEPARTMENT_OTHER)
Admission: EM | Admit: 2015-03-07 | Discharge: 2015-03-07 | Disposition: A | Discharge status: Home / Self Care | Payer: Medicaid Other | Attending: Emergency Medicine | Admitting: Emergency Medicine

## 2015-03-07 DIAGNOSIS — K219 Gastro-esophageal reflux disease without esophagitis: Secondary | ICD-10-CM | POA: Insufficient documentation

## 2015-03-07 DIAGNOSIS — Z86018 Personal history of other benign neoplasm: Secondary | ICD-10-CM | POA: Diagnosis not present

## 2015-03-07 DIAGNOSIS — K3184 Gastroparesis: Secondary | ICD-10-CM

## 2015-03-07 DIAGNOSIS — E876 Hypokalemia: Secondary | ICD-10-CM | POA: Diagnosis not present

## 2015-03-07 DIAGNOSIS — E1143 Type 2 diabetes mellitus with diabetic autonomic (poly)neuropathy: Secondary | ICD-10-CM | POA: Diagnosis not present

## 2015-03-07 DIAGNOSIS — Z794 Long term (current) use of insulin: Secondary | ICD-10-CM | POA: Diagnosis not present

## 2015-03-07 DIAGNOSIS — J45909 Unspecified asthma, uncomplicated: Secondary | ICD-10-CM | POA: Diagnosis not present

## 2015-03-07 DIAGNOSIS — E785 Hyperlipidemia, unspecified: Secondary | ICD-10-CM | POA: Insufficient documentation

## 2015-03-07 DIAGNOSIS — Z7901 Long term (current) use of anticoagulants: Secondary | ICD-10-CM | POA: Insufficient documentation

## 2015-03-07 DIAGNOSIS — I4891 Unspecified atrial fibrillation: Secondary | ICD-10-CM | POA: Insufficient documentation

## 2015-03-07 DIAGNOSIS — E669 Obesity, unspecified: Secondary | ICD-10-CM | POA: Insufficient documentation

## 2015-03-07 DIAGNOSIS — I10 Essential (primary) hypertension: Secondary | ICD-10-CM | POA: Diagnosis not present

## 2015-03-07 DIAGNOSIS — Z79899 Other long term (current) drug therapy: Secondary | ICD-10-CM | POA: Insufficient documentation

## 2015-03-07 DIAGNOSIS — Z8669 Personal history of other diseases of the nervous system and sense organs: Secondary | ICD-10-CM | POA: Diagnosis not present

## 2015-03-07 DIAGNOSIS — R112 Nausea with vomiting, unspecified: Secondary | ICD-10-CM | POA: Diagnosis present

## 2015-03-07 LAB — BASIC METABOLIC PANEL
ANION GAP: 12 (ref 5–15)
BUN: 16 mg/dL (ref 6–20)
CALCIUM: 8.9 mg/dL (ref 8.9–10.3)
CO2: 29 mmol/L (ref 22–32)
CREATININE: 0.82 mg/dL (ref 0.44–1.00)
Chloride: 98 mmol/L — ABNORMAL LOW (ref 101–111)
GFR calc Af Amer: >60 mL/min (ref >60)
GLUCOSE: 276 mg/dL — AB (ref 65–99)
Potassium: 2.9 mmol/L — ABNORMAL LOW (ref 3.5–5.1)
Sodium: 139 mmol/L (ref 135–145)

## 2015-03-07 LAB — CBC WITH DIFFERENTIAL/PLATELET
BASOS ABS: 0 10*3/uL (ref 0.0–0.1)
Basophils Relative: 1 %
EOS PCT: 0 %
Eosinophils Absolute: 0 10*3/uL (ref 0.0–0.7)
HCT: 27.8 % — ABNORMAL LOW (ref 36.0–46.0)
Hemoglobin: 9.1 g/dL — ABNORMAL LOW (ref 12.0–15.0)
LYMPHS PCT: 17 %
Lymphs Abs: 1.4 10*3/uL (ref 0.7–4.0)
MCH: 28.5 pg (ref 26.0–34.0)
MCHC: 32.7 g/dL (ref 30.0–36.0)
MCV: 87.1 fL (ref 78.0–100.0)
MONO ABS: 0.6 10*3/uL (ref 0.1–1.0)
Monocytes Relative: 7 %
NEUTROS PCT: 75 %
Neutro Abs: 6.3 10*3/uL (ref 1.7–7.7)
PLATELETS: 337 10*3/uL (ref 150–400)
RBC: 3.19 MIL/uL — ABNORMAL LOW (ref 3.87–5.11)
RDW: 12.4 % (ref 11.5–15.5)
WBC: 8.3 10*3/uL (ref 4.0–10.5)

## 2015-03-07 LAB — CBG MONITORING, ED: Glucose-Capillary: 273 mg/dL — ABNORMAL HIGH (ref 65–99)

## 2015-03-07 MED ORDER — SODIUM CHLORIDE 0.9 % IV BOLUS (SEPSIS)
1000.0000 mL | Freq: Once | INTRAVENOUS | Status: AC
  Administered 2015-03-07: 1000 mL via INTRAVENOUS

## 2015-03-07 MED ORDER — POTASSIUM CHLORIDE CRYS ER 20 MEQ PO TBCR
40.0000 meq | EXTENDED_RELEASE_TABLET | Freq: Once | ORAL | Status: AC
  Administered 2015-03-07: 40 meq via ORAL
  Filled 2015-03-07: qty 2

## 2015-03-07 MED ORDER — HALOPERIDOL LACTATE 5 MG/ML IJ SOLN
5.0000 mg | Freq: Once | INTRAMUSCULAR | Status: AC
  Administered 2015-03-07: 5 mg via INTRAVENOUS
  Filled 2015-03-07: qty 1

## 2015-03-07 NOTE — ED Provider Notes (Signed)
CSN: [id number]     Arrival date & time 03/07/15  0147 History   First MD Initiated Contact with Patient 03/07/15 [phone]     Chief Complaint  Patient presents with  . Vomiting      (Consider location/radiation/quality/duration/timing/severity/associated sxs/prior Treatment) HPI  This is a 49 year old female with a history of diabetes and diabetic gastroparesis. She has had multiple visits to the ED for the same. She is here with vomiting that began about noon yesterday. She estimates she has vomited 8 times. Her nausea is moderate to severe. She denies abdominal pain. She has not been able to keep down her oral medications. She states that Haldol is often effective for her symptoms.  Past Medical History  Diagnosis Date  . Asthma   . Diabetes mellitus   . Hyperlipidemia   . Hypertension   . GERD (gastroesophageal reflux disease)   . Cerebellar tumor ([place])   . Obesity   . Sleep apnea   . A-fib ([place])   . Thyroid disease   . Chest pain 03/27/2014    negative lexiscan myoview   Past Surgical History  Procedure Laterality Date  . Cholecystectomy    . Partial hysterectomy      ovaries remain  . Nasal sinus surgery      Dr. Lyn Hollingshead  . Abdominal hysterectomy     Family History  Problem Relation Age of Onset  . Coronary artery disease Father   . Hypertension Father   . Diabetes      maternal grandparents  . Hyperlipidemia Other    Social History  Substance Use Topics  . Smoking status: Never Smoker   . Smokeless tobacco: Never Used  . Alcohol Use: No   OB History    No data available     Review of Systems  All other systems reviewed and are negative.   Allergies  Review of patient's allergies indicates no known allergies.  Home Medications   Prior to Admission medications   Medication Sig Start Date End Date Taking? Authorizing Provider  albuterol (PROVENTIL HFA;VENTOLIN HFA) 108 (90 BASE) MCG/ACT inhaler Inhale 2 puffs into the lungs every 4 (four) hours as  needed for wheezing or shortness of breath.    Historical Provider, MD  ALPRAZolam Duanne Moron) 0.5 MG tablet Take 1 tablet (0.5 mg total) by mouth 2 (two) times daily as needed for anxiety. 10/29/14   Midge Minium, MD  amLODipine (NORVASC) 5 MG tablet Take 5 mg by mouth daily.    Historical Provider, MD  atenolol (TENORMIN) 100 MG tablet Take 1 tablet (100 mg total) by mouth every evening. 10/15/14   Midge Minium, MD  atenolol (TENORMIN) 25 MG tablet Take 1 tablet (25 mg total) by mouth daily as needed. 09/13/14   Sueanne Margarita, MD  atorvastatin (LIPITOR) 80 MG tablet Take 1 tablet (80 mg total) by mouth daily. 11/22/14   Sueanne Margarita, MD  azelastine (OPTIVAR) 0.05 % ophthalmic solution Place 1 drop into both eyes 2 (two) times daily. 10/15/14   Midge Minium, MD  beclomethasone (QVAR) 80 MCG/ACT inhaler Inhale 3 puffs into the lungs daily. Patient taking differently: Inhale 3 puffs into the lungs daily as needed (wheezing).  10/12/14   Colon Branch, MD  canagliflozin (INVOKANA) 300 MG TABS tablet Take 300 mg by mouth daily before breakfast.    Historical Provider, MD  cetirizine (ZYRTEC) 10 MG tablet Take 1 tablet (10 mg total) by mouth daily. 10/15/14   Aundra Millet  Birdie Riddle, MD  cloNIDine (CATAPRES) 0.1 MG tablet Take 1 tablet (0.1 mg total) by mouth 2 (two) times daily. 12/20/14   Midge Minium, MD  dicyclomine (BENTYL) 20 MG tablet Take 1 tablet (20 mg total) by mouth 2 (two) times daily. 12/12/14   Dorie Rank, MD  escitalopram (LEXAPRO) 10 MG tablet Take 1 tablet (10 mg total) by mouth daily. 10/15/14   Midge Minium, MD  glipiZIDE (GLUCOTROL XL) 10 MG 24 hr tablet Take 10 mg by mouth daily with breakfast.    Historical Provider, MD  glucose blood test strip 1 each by Other route as needed. Onetouch ultra test strip     Historical Provider, MD  insulin aspart (NOVOLOG FLEXPEN) 100 UNIT/ML FlexPen Inject 6 Units into the skin See admin instructions. Per sliding scale 01/30/15    Historical Provider, MD  insulin glargine (LANTUS) 100 UNIT/ML injection Inject 32 Units into the skin every evening.    Historical Provider, MD  IRON PO Take 2 tablets by mouth 3 (three) times a week.     Historical Provider, MD  LANTUS SOLOSTAR 100 UNIT/ML Solostar Pen Inject 20 Units into the skin 4 (four) times daily -  before meals and at bedtime. AS DIRECTED 02/27/14   Historical Provider, MD  lisinopril-hydrochlorothiazide (PRINZIDE,ZESTORETIC) 20-12.5 MG per tablet Take 1 tablet by mouth 2 (two) times daily. 11/26/14   Midge Minium, MD  metFORMIN (GLUCOPHAGE) 1000 MG tablet Take 1,000 mg by mouth 2 (two) times daily with a meal.    Historical Provider, MD  methimazole (TAPAZOLE) 5 MG tablet Take 7.5 mg by mouth every evening.  02/27/14   Historical Provider, MD  metoCLOPramide (REGLAN) 10 MG tablet Take 1 tablet (10 mg total) by mouth every 6 (six) hours as needed for nausea (nausea/headache). 12/11/14   Orpah Greek, MD  metoCLOPramide (REGLAN) 5 MG tablet Take 1 tablet (5 mg total) by mouth 3 (three) times daily before meals. 02/09/15   Thurnell Lose, MD  Multiple Vitamin (MULTIVITAMIN WITH MINERALS) TABS tablet Take 1 tablet by mouth every evening.     Historical Provider, MD  Omega-3 Fatty Acids (FISH OIL) 1000 MG CAPS Take 2,000 mg by mouth every evening.     Historical Provider, MD  ondansetron (ZOFRAN ODT) 4 MG disintegrating tablet Take 1 tablet (4 mg total) by mouth every 8 (eight) hours as needed for nausea or vomiting. 02/09/15   Thurnell Lose, MD  pantoprazole (PROTONIX) 40 MG tablet Take 1 tablet (40 mg total) by mouth daily. Patient taking differently: Take 40 mg by mouth 2 (two) times daily.  06/07/14   Midge Minium, MD  polyethylene glycol [facility] / [place]ia [place]) packet Take 17 g by mouth daily. 01/30/15   Historical Provider, MD  potassium chloride (K-DUR) 10 MEQ tablet Take 2 tablets (20 mEq total) by mouth 2 (two) times daily. 02/18/15   Veryl Speak, MD   potassium chloride SA (K-DUR,KLOR-CON) 20 MEQ tablet Take 20 mEq by mouth daily as needed (for leg cramping). Take one tablet by mouth daily. 02/26/12   Midge Minium, MD  promethazine (PHENERGAN) 25 MG tablet Take 1 tablet (25 mg total) by mouth every 6 (six) hours as needed for nausea or vomiting. 02/16/15   Pattricia Boss, MD  rivaroxaban (XARELTO) 20 MG TABS tablet Take 20 mg by mouth daily. 07/02/14   Historical Provider, MD  sucralfate (CARAFATE) 1 G tablet Take 1 tablet (1 g total) by mouth 4 (four) times daily -  with meals and at bedtime. 12/20/14   Midge Minium, MD  Vitamin D, Ergocalciferol, (DRISDOL) 50000 UNITS CAPS capsule Take 50,000 Units by mouth every 7 (seven) days.    Historical Provider, MD   BP 195/85 mmHg  Pulse 114  Temp(Src) 98.7 F (37.1 C) (Oral)  Resp 18  SpO2 97%   Physical Exam  General: Well-developed, well-nourished female in no acute distress; appearance consistent with age of record HENT: normocephalic; atraumatic Eyes: pupils equal, round and reactive to light; extraocular muscles intact Neck: supple Heart: regular rate and rhythm; tachycardia Lungs: clear to auscultation bilaterally Abdomen: soft; nondistended; mild diffuse tenderness; no masses or hepatosplenomegaly; bowel sounds present Extremities: No deformity; full range of motion; pulses normal Neurologic: Awake, alert and oriented; motor function intact in all extremities and symmetric; no facial droop Skin: Warm and dry Psychiatric: Flat affect    ED Course  Procedures (including critical care time)   MDM   Nursing notes and vitals signs, including pulse oximetry, reviewed.  Summary of this visit's results, reviewed by myself:  Labs:  Results for orders placed or performed during the hospital encounter of 03/07/15 (from the past 24 hour(s))  POC CBG, ED     Status: Abnormal   Collection Time: 03/07/15  2:19 AM  Result Value Ref Range   Glucose-Capillary 273 (H) 65 - 99  mg/dL  CBC with Differential/Platelet     Status: Abnormal   Collection Time: 03/07/15  3:07 AM  Result Value Ref Range   WBC 8.3 4.0 - 10.5 K/uL   RBC 3.19 (L) 3.87 - 5.11 MIL/uL   Hemoglobin 9.1 (L) 12.0 - 15.0 g/dL   HCT 27.8 (L) 36.0 - 46.0 %   MCV 87.1 78.0 - 100.0 fL   MCH 28.5 26.0 - 34.0 pg   MCHC 32.7 30.0 - 36.0 g/dL   RDW 12.4 11.5 - 15.5 %   Platelets 337 150 - 400 K/uL   Neutrophils Relative % 75 %   Neutro Abs 6.3 1.7 - 7.7 K/uL   Lymphocytes Relative 17 %   Lymphs Abs 1.4 0.7 - 4.0 K/uL   Monocytes Relative 7 %   Monocytes Absolute 0.6 0.1 - 1.0 K/uL   Eosinophils Relative 0 %   Eosinophils Absolute 0.0 0.0 - 0.7 K/uL   Basophils Relative 1 %   Basophils Absolute 0.0 0.0 - 0.1 K/uL  Basic metabolic panel     Status: Abnormal   Collection Time: 03/07/15  3:07 AM  Result Value Ref Range   Sodium 139 135 - 145 mmol/L   Potassium 2.9 (L) 3.5 - 5.1 mmol/L   Chloride 98 (L) 101 - 111 mmol/L   CO2 29 22 - 32 mmol/L   Glucose, Bld 276 (H) 65 - 99 mg/dL   BUN 16 6 - 20 mg/dL   Creatinine, Ser 0.82 0.44 - 1.00 mg/dL   Calcium 8.9 8.9 - 10.3 mg/dL   GFR calc non Af Amer >60 >60 mL/min   GFR calc Af Amer >60 >60 mL/min   Anion gap 12 5 - 15   4:17 AM Patient drinking fluids without emesis after IV Haldol. She was given 40 milliequivalents of controlled release testing chloride. She has a prescription for potassium supplement at home as well.   Shanon Rosser, MD 03/07/15 [phone]

## 2015-03-07 NOTE — ED Notes (Signed)
Pt c/o n/v onset yesterday around noon  States no pain

## 2015-03-07 NOTE — ED Notes (Signed)
Patient ambulatory to restroom  ?

## 2015-03-08 ENCOUNTER — Emergency Department (HOSPITAL_BASED_OUTPATIENT_CLINIC_OR_DEPARTMENT_OTHER)
Admission: EM | Admit: 2015-03-08 | Discharge: 2015-03-08 | Disposition: A | Discharge status: Home / Self Care | Payer: Medicaid Other | Attending: Emergency Medicine | Admitting: Emergency Medicine

## 2015-03-08 ENCOUNTER — Encounter (HOSPITAL_BASED_OUTPATIENT_CLINIC_OR_DEPARTMENT_OTHER): Payer: Self-pay | Admitting: *Deleted

## 2015-03-08 ENCOUNTER — Emergency Department (HOSPITAL_BASED_OUTPATIENT_CLINIC_OR_DEPARTMENT_OTHER): Payer: Medicaid Other

## 2015-03-08 DIAGNOSIS — E119 Type 2 diabetes mellitus without complications: Secondary | ICD-10-CM | POA: Insufficient documentation

## 2015-03-08 DIAGNOSIS — I10 Essential (primary) hypertension: Secondary | ICD-10-CM | POA: Insufficient documentation

## 2015-03-08 DIAGNOSIS — K219 Gastro-esophageal reflux disease without esophagitis: Secondary | ICD-10-CM | POA: Insufficient documentation

## 2015-03-08 DIAGNOSIS — E079 Disorder of thyroid, unspecified: Secondary | ICD-10-CM | POA: Insufficient documentation

## 2015-03-08 DIAGNOSIS — J45909 Unspecified asthma, uncomplicated: Secondary | ICD-10-CM | POA: Insufficient documentation

## 2015-03-08 DIAGNOSIS — Z8669 Personal history of other diseases of the nervous system and sense organs: Secondary | ICD-10-CM | POA: Insufficient documentation

## 2015-03-08 DIAGNOSIS — Z7901 Long term (current) use of anticoagulants: Secondary | ICD-10-CM | POA: Insufficient documentation

## 2015-03-08 DIAGNOSIS — J029 Acute pharyngitis, unspecified: Secondary | ICD-10-CM | POA: Diagnosis present

## 2015-03-08 DIAGNOSIS — E785 Hyperlipidemia, unspecified: Secondary | ICD-10-CM | POA: Diagnosis not present

## 2015-03-08 DIAGNOSIS — Z7951 Long term (current) use of inhaled steroids: Secondary | ICD-10-CM | POA: Diagnosis not present

## 2015-03-08 DIAGNOSIS — Z79899 Other long term (current) drug therapy: Secondary | ICD-10-CM | POA: Insufficient documentation

## 2015-03-08 DIAGNOSIS — E669 Obesity, unspecified: Secondary | ICD-10-CM | POA: Insufficient documentation

## 2015-03-08 DIAGNOSIS — R112 Nausea with vomiting, unspecified: Secondary | ICD-10-CM

## 2015-03-08 DIAGNOSIS — Z794 Long term (current) use of insulin: Secondary | ICD-10-CM | POA: Insufficient documentation

## 2015-03-08 LAB — URINALYSIS, ROUTINE W REFLEX MICROSCOPIC
HGB URINE DIPSTICK: NEGATIVE
KETONES UR: 40 mg/dL — AB
Leukocytes, UA: NEGATIVE
NITRITE: NEGATIVE
PH: 5.5 (ref 5.0–8.0)
Protein, ur: 100 mg/dL — AB
Specific Gravity, Urine: 1.036 — ABNORMAL HIGH (ref 1.005–1.030)

## 2015-03-08 LAB — CBG MONITORING, ED
Glucose-Capillary: 254 mg/dL — ABNORMAL HIGH (ref 65–99)
Glucose-Capillary: 227 mg/dL — ABNORMAL HIGH (ref 65–99)

## 2015-03-08 LAB — TSH: TSH: 0.581 u[IU]/mL (ref 0.350–4.500)

## 2015-03-08 LAB — URINE MICROSCOPIC-ADD ON

## 2015-03-08 LAB — COMPREHENSIVE METABOLIC PANEL
ALK PHOS: 133 U/L — AB (ref 38–126)
ALT: 22 U/L (ref 14–54)
AST: 19 U/L (ref 15–41)
Albumin: 3.9 g/dL (ref 3.5–5.0)
Anion gap: 16 — ABNORMAL HIGH (ref 5–15)
BUN: 26 mg/dL — AB (ref 6–20)
CALCIUM: 9.8 mg/dL (ref 8.9–10.3)
CO2: 28 mmol/L (ref 22–32)
CREATININE: 1.03 mg/dL — AB (ref 0.44–1.00)
Chloride: 93 mmol/L — ABNORMAL LOW (ref 101–111)
GFR calc non Af Amer: >60 mL/min (ref >60)
GLUCOSE: 256 mg/dL — AB (ref 65–99)
Potassium: 3 mmol/L — ABNORMAL LOW (ref 3.5–5.1)
SODIUM: 137 mmol/L (ref 135–145)
Total Bilirubin: 1.1 mg/dL (ref 0.3–1.2)
Total Protein: 8.5 g/dL — ABNORMAL HIGH (ref 6.5–8.1)

## 2015-03-08 LAB — CBC WITH DIFFERENTIAL/PLATELET
Basophils Absolute: 0 10*3/uL (ref 0.0–0.1)
Basophils Relative: 0 %
EOS ABS: 0 10*3/uL (ref 0.0–0.7)
Eosinophils Relative: 0 %
HCT: 31.5 % — ABNORMAL LOW (ref 36.0–46.0)
HEMOGLOBIN: 10.2 g/dL — AB (ref 12.0–15.0)
LYMPHS ABS: 1.7 10*3/uL (ref 0.7–4.0)
LYMPHS PCT: 16 %
MCH: 28 pg (ref 26.0–34.0)
MCHC: 32.4 g/dL (ref 30.0–36.0)
MCV: 86.5 fL (ref 78.0–100.0)
Monocytes Absolute: 0.8 10*3/uL (ref 0.1–1.0)
Monocytes Relative: 8 %
NEUTROS PCT: 76 %
Neutro Abs: 7.8 10*3/uL — ABNORMAL HIGH (ref 1.7–7.7)
Platelets: 449 10*3/uL — ABNORMAL HIGH (ref 150–400)
RBC: 3.64 MIL/uL — AB (ref 3.87–5.11)
RDW: 12.6 % (ref 11.5–15.5)
WBC: 10.3 10*3/uL (ref 4.0–10.5)

## 2015-03-08 LAB — RAPID STREP SCREEN (MED CTR MEBANE ONLY): Streptococcus, Group A Screen (Direct): NEGATIVE

## 2015-03-08 MED ORDER — HALOPERIDOL LACTATE 5 MG/ML IJ SOLN
5.0000 mg | Freq: Once | INTRAMUSCULAR | Status: AC
  Administered 2015-03-08: 5 mg via INTRAVENOUS
  Filled 2015-03-08: qty 1

## 2015-03-08 MED ORDER — ONDANSETRON HCL 4 MG/2ML IJ SOLN
4.0000 mg | Freq: Once | INTRAMUSCULAR | Status: AC
  Administered 2015-03-08: 4 mg via INTRAVENOUS
  Filled 2015-03-08: qty 2

## 2015-03-08 MED ORDER — ONDANSETRON 4 MG PO TBDP: 4.0000 mg | ORAL_TABLET | Freq: Three times a day (TID) | ORAL | Status: DC | PRN

## 2015-03-08 MED ORDER — GI COCKTAIL ~~LOC~~
30.0000 mL | Freq: Once | ORAL | Status: AC
  Administered 2015-03-08: 30 mL via ORAL
  Filled 2015-03-08: qty 30

## 2015-03-08 MED ORDER — SODIUM CHLORIDE 0.9 % IV BOLUS (SEPSIS)
1000.0000 mL | Freq: Once | INTRAVENOUS | Status: AC
  Administered 2015-03-08: 1000 mL via INTRAVENOUS

## 2015-03-08 MED ORDER — SODIUM CHLORIDE 0.9 % IV BOLUS (SEPSIS): 1000.0000 mL | Freq: Once | INTRAVENOUS | Status: DC

## 2015-03-08 MED ORDER — PANTOPRAZOLE SODIUM 40 MG IV SOLR
40.0000 mg | Freq: Once | INTRAVENOUS | Status: AC
  Administered 2015-03-08: 40 mg via INTRAVENOUS
  Filled 2015-03-08: qty 40

## 2015-03-08 MED ORDER — METOCLOPRAMIDE HCL 5 MG/ML IJ SOLN
10.0000 mg | Freq: Once | INTRAMUSCULAR | Status: AC
  Administered 2015-03-08: 10 mg via INTRAVENOUS
  Filled 2015-03-08: qty 2

## 2015-03-08 NOTE — ED Notes (Signed)
Checked CBG Results: 227

## 2015-03-08 NOTE — ED Notes (Signed)
Pt reports she believes she has acid reflux- c/o "throat burning" and keeping her up last night- requesting throat xray

## 2015-03-08 NOTE — Discharge Instructions (Signed)
You have been seen today for vomiting. Your lab tests showed no acute abnormalities. Follow up with PCP at the beginning of next week. Return to ED should symptoms worsen.

## 2015-03-08 NOTE — ED Provider Notes (Signed)
CSN: [id number]     Arrival date & time 03/08/15  1504 History   First MD Initiated Contact with Patient 03/08/15 1733     Chief Complaint  Patient presents with  . Sore Throat     (Consider location/radiation/quality/duration/timing/severity/associated sxs/prior Treatment) HPI   Crystal Peck is a 49 y.o. female, with a history of DM, GERD, and HTN, presenting to the ED with burning in the back of her throat and vomiting since yesterday. Pt rates the pain 10/10 and nonradiating. Pt states she has not been able to keep her medications down, including her GERD medications. Pt has not taken any medications for the pain. Pt has vomiting >10 times in the last 24 hours. Pt also complains of a productive cough with yellow sputum. Pt denies fever, abdominal pain, chest pain, shortness of breath, or any other pain or complaints. Pt states she was able to last take her medications yesterday. Pt states that Haldol has helped her in the past. Pt was also seen in the ED yesterday for the same complaint.     Past Medical History  Diagnosis Date  . Asthma   . Diabetes mellitus   . Hyperlipidemia   . Hypertension   . GERD (gastroesophageal reflux disease)   . Cerebellar tumor ([place])   . Obesity   . Sleep apnea   . A-fib ([place])   . Thyroid disease   . Chest pain 03/27/2014    negative lexiscan myoview   Past Surgical History  Procedure Laterality Date  . Cholecystectomy    . Partial hysterectomy      ovaries remain  . Nasal sinus surgery      Dr. Lyn Hollingshead  . Abdominal hysterectomy     Family History  Problem Relation Age of Onset  . Coronary artery disease Father   . Hypertension Father   . Diabetes      maternal grandparents  . Hyperlipidemia Other    Social History  Substance Use Topics  . Smoking status: Never Smoker   . Smokeless tobacco: Never Used  . Alcohol Use: No   OB History    No data available     Review of Systems  HENT: Positive for sore throat.    Respiratory: Negative for shortness of breath.   Cardiovascular: Negative for chest pain.  Gastrointestinal: Positive for nausea and vomiting. Negative for abdominal pain, diarrhea and constipation.  All other systems reviewed and are negative.     Allergies  Review of patient's allergies indicates no known allergies.  Home Medications   Prior to Admission medications   Medication Sig Start Date End Date Taking? Authorizing Provider  albuterol (PROVENTIL HFA;VENTOLIN HFA) 108 (90 BASE) MCG/ACT inhaler Inhale 2 puffs into the lungs every 4 (four) hours as needed for wheezing or shortness of breath.    Historical Provider, MD  ALPRAZolam Duanne Moron) 0.5 MG tablet Take 1 tablet (0.5 mg total) by mouth 2 (two) times daily as needed for anxiety. 10/29/14   Midge Minium, MD  amLODipine (NORVASC) 5 MG tablet Take 5 mg by mouth daily.    Historical Provider, MD  atenolol (TENORMIN) 100 MG tablet Take 1 tablet (100 mg total) by mouth every evening. 10/15/14   Midge Minium, MD  atenolol (TENORMIN) 25 MG tablet Take 1 tablet (25 mg total) by mouth daily as needed. 09/13/14   Sueanne Margarita, MD  atorvastatin (LIPITOR) 80 MG tablet Take 1 tablet (80 mg total) by mouth daily. 11/22/14   Traci  Remonia Richter, MD  azelastine (OPTIVAR) 0.05 % ophthalmic solution Place 1 drop into both eyes 2 (two) times daily. 10/15/14   Midge Minium, MD  beclomethasone (QVAR) 80 MCG/ACT inhaler Inhale 3 puffs into the lungs daily. Patient taking differently: Inhale 3 puffs into the lungs daily as needed (wheezing).  10/12/14   Colon Branch, MD  canagliflozin (INVOKANA) 300 MG TABS tablet Take 300 mg by mouth daily before breakfast.    Historical Provider, MD  cetirizine (ZYRTEC) 10 MG tablet Take 1 tablet (10 mg total) by mouth daily. 10/15/14   Midge Minium, MD  cloNIDine (CATAPRES) 0.1 MG tablet Take 1 tablet (0.1 mg total) by mouth 2 (two) times daily. 12/20/14   Midge Minium, MD  dicyclomine (BENTYL) 20 MG  tablet Take 1 tablet (20 mg total) by mouth 2 (two) times daily. 12/12/14   Dorie Rank, MD  escitalopram (LEXAPRO) 10 MG tablet Take 1 tablet (10 mg total) by mouth daily. 10/15/14   Midge Minium, MD  glipiZIDE (GLUCOTROL XL) 10 MG 24 hr tablet Take 10 mg by mouth daily with breakfast.    Historical Provider, MD  glucose blood test strip 1 each by Other route as needed. Onetouch ultra test strip     Historical Provider, MD  insulin aspart (NOVOLOG FLEXPEN) 100 UNIT/ML FlexPen Inject 6 Units into the skin See admin instructions. Per sliding scale 01/30/15   Historical Provider, MD  insulin glargine (LANTUS) 100 UNIT/ML injection Inject 32 Units into the skin every evening.    Historical Provider, MD  IRON PO Take 2 tablets by mouth 3 (three) times a week.     Historical Provider, MD  LANTUS SOLOSTAR 100 UNIT/ML Solostar Pen Inject 20 Units into the skin 4 (four) times daily -  before meals and at bedtime. AS DIRECTED 02/27/14   Historical Provider, MD  lisinopril-hydrochlorothiazide (PRINZIDE,ZESTORETIC) 20-12.5 MG per tablet Take 1 tablet by mouth 2 (two) times daily. 11/26/14   Midge Minium, MD  metFORMIN (GLUCOPHAGE) 1000 MG tablet Take 1,000 mg by mouth 2 (two) times daily with a meal.    Historical Provider, MD  methimazole (TAPAZOLE) 5 MG tablet Take 7.5 mg by mouth every evening.  02/27/14   Historical Provider, MD  metoCLOPramide (REGLAN) 10 MG tablet Take 1 tablet (10 mg total) by mouth every 6 (six) hours as needed for nausea (nausea/headache). 12/11/14   Orpah Greek, MD  metoCLOPramide (REGLAN) 5 MG tablet Take 1 tablet (5 mg total) by mouth 3 (three) times daily before meals. 02/09/15   Thurnell Lose, MD  Multiple Vitamin (MULTIVITAMIN WITH MINERALS) TABS tablet Take 1 tablet by mouth every evening.     Historical Provider, MD  Omega-3 Fatty Acids (FISH OIL) 1000 MG CAPS Take 2,000 mg by mouth every evening.     Historical Provider, MD  ondansetron (ZOFRAN ODT) 4 MG  disintegrating tablet Take 1 tablet (4 mg total) by mouth every 8 (eight) hours as needed for nausea or vomiting. 02/09/15   Thurnell Lose, MD  ondansetron (ZOFRAN ODT) 4 MG disintegrating tablet Take 1 tablet (4 mg total) by mouth every 8 (eight) hours as needed for nausea or vomiting. 03/08/15   Shawn C Joy, PA-C  pantoprazole (PROTONIX) 40 MG tablet Take 1 tablet (40 mg total) by mouth daily. Patient taking differently: Take 40 mg by mouth 2 (two) times daily.  06/07/14   Midge Minium, MD  polyethylene glycol [facility] / Floria Raveling) packet Take  17 g by mouth daily. 01/30/15   Historical Provider, MD  potassium chloride (K-DUR) 10 MEQ tablet Take 2 tablets (20 mEq total) by mouth 2 (two) times daily. 02/18/15   Veryl Speak, MD  potassium chloride SA (K-DUR,KLOR-CON) 20 MEQ tablet Take 20 mEq by mouth daily as needed (for leg cramping). Take one tablet by mouth daily. 02/26/12   Midge Minium, MD  promethazine (PHENERGAN) 25 MG tablet Take 1 tablet (25 mg total) by mouth every 6 (six) hours as needed for nausea or vomiting. 02/16/15   Pattricia Boss, MD  rivaroxaban (XARELTO) 20 MG TABS tablet Take 20 mg by mouth daily. 07/02/14   Historical Provider, MD  sucralfate (CARAFATE) 1 G tablet Take 1 tablet (1 g total) by mouth 4 (four) times daily -  with meals and at bedtime. 12/20/14   Midge Minium, MD  Vitamin D, Ergocalciferol, (DRISDOL) 50000 UNITS CAPS capsule Take 50,000 Units by mouth every 7 (seven) days.    Historical Provider, MD   BP 182/91 mmHg  Pulse 100  Temp(Src) 98.2 F (36.8 C) (Oral)  Resp 20  Ht 5\' 1"  (1.549 m)  Wt 150 lb (68.04 kg)  BMI 28.36 kg/m2  SpO2 100% Physical Exam  Constitutional: She appears well-developed and well-nourished. No distress.  HENT:  Head: Normocephalic and atraumatic.  Right Ear: External ear normal.  Left Ear: External ear normal.  Mouth/Throat: Oropharynx is clear and moist.  Eyes: Conjunctivae are normal. Pupils are equal, round, and  reactive to light.  Neck: Normal range of motion. Neck supple.  Cardiovascular: Normal rate, regular rhythm and normal heart sounds.   Pulmonary/Chest: Effort normal and breath sounds normal. No respiratory distress.  Abdominal: Soft. Bowel sounds are normal.  Musculoskeletal: She exhibits no edema or tenderness.  Lymphadenopathy:    She has no cervical adenopathy.  Neurological: She is alert.  Skin: Skin is warm and dry. She is not diaphoretic.  Nursing note and vitals reviewed.   ED Course  Procedures (including critical care time) Labs Review Labs Reviewed  CBC WITH DIFFERENTIAL/PLATELET - Abnormal; Notable for the following:    RBC 3.64 (*)    Hemoglobin 10.2 (*)    HCT 31.5 (*)    Platelets 449 (*)    Neutro Abs 7.8 (*)    All other components within normal limits  COMPREHENSIVE METABOLIC PANEL - Abnormal; Notable for the following:    Potassium 3.0 (*)    Chloride 93 (*)    Glucose, Bld 256 (*)    BUN 26 (*)    Creatinine, Ser 1.03 (*)    Total Protein 8.5 (*)    Alkaline Phosphatase 133 (*)    Anion gap 16 (*)    All other components within normal limits  CBG MONITORING, ED - Abnormal; Notable for the following:    Glucose-Capillary 254 (*)    All other components within normal limits  CBG MONITORING, ED - Abnormal; Notable for the following:    Glucose-Capillary 227 (*)    All other components within normal limits  RAPID STREP SCREEN (NOT AT [facility])  CULTURE, GROUP A STREP  URINALYSIS, ROUTINE W REFLEX MICROSCOPIC (NOT AT [facility])  TSH    Imaging Review Dg Chest 2 View  03/08/2015  CLINICAL DATA:  Pt co acid reflux for a while pt would not be specific for how long EXAM: CHEST - 2 VIEW COMPARISON:  01/13/2015 FINDINGS: Lungs are clear. Heart size and mediastinal contours are within normal limits. No effusion.  Visualized skeletal structures are unremarkable. Surgical clips right upper abdomen IMPRESSION: No acute cardiopulmonary disease. Electronically Signed   By: Lucrezia Europe M.D.   On: 03/08/2015 19:03   I have personally reviewed and evaluated these images and lab results as part of my medical decision-making.   EKG Interpretation None      MDM   Final diagnoses:  Non-intractable vomiting with nausea, vomiting of unspecified type    TRESHA VALDESPINO presents with a sore throat after vomiting for two days.   Findings and plan of care discussed with Alfonzo Beers, MD.  Patient has a known history of gastroparesis. Review of the patient's past labs show that she chronically has abnormal labs, to include hypochloremia and hypokalemia. It is believed that the patient's slight increase in creatinine and BUN is due to dehydration. Plan to give patient a fluid challenge of by mouth fluids. The patient tolerates by mouth fluids and she will continue to be treated symptomatically. If she fails the fluid challenge she will most likely be admitted. Upon reassessment, patient is resting comfortably and when she was woken up patient states that she feels much better. Patient states, "I finally was able to get some sleep and I was pretty tired." Patient seems to be responding well to our treatment. Patient had a successful fluid challenge. When asked if she felt well enough to go home patient replied that she did and wants to go home at this time. Patient appears to be stable for discharge. Patient advised that should symptoms worsen or intractable vomiting occur, that she should return to the ED. Patient was also advised to follow up with her PCP. Patient to be discharged with Zofran for home use. Patient states that her daughter will come pick her up from the ED and the patient will not be driving home. Patient is comfortable with the plan of care and with discharge. Patient care left with Dr. Canary Brim at the end of my shift. We are just awaiting patient's urinalysis results prior to discharge.  Lorayne Bender, PA-C 03/08/15 [place], PA-C 03/08/15  2234  Alfonzo Beers, MD 03/08/15 [phone]

## 2015-03-10 LAB — URINE CULTURE: Culture: NO GROWTH

## 2015-03-11 ENCOUNTER — Emergency Department (HOSPITAL_BASED_OUTPATIENT_CLINIC_OR_DEPARTMENT_OTHER): Payer: Medicaid Other

## 2015-03-11 ENCOUNTER — Encounter (HOSPITAL_BASED_OUTPATIENT_CLINIC_OR_DEPARTMENT_OTHER): Payer: Self-pay | Admitting: Emergency Medicine

## 2015-03-11 ENCOUNTER — Emergency Department (HOSPITAL_BASED_OUTPATIENT_CLINIC_OR_DEPARTMENT_OTHER)
Admission: EM | Admit: 2015-03-11 | Discharge: 2015-03-11 | Disposition: A | Discharge status: Home / Self Care | Payer: Medicaid Other | Attending: Emergency Medicine | Admitting: Emergency Medicine

## 2015-03-11 ENCOUNTER — Other Ambulatory Visit: Payer: Self-pay

## 2015-03-11 DIAGNOSIS — Z86011 Personal history of benign neoplasm of the brain: Secondary | ICD-10-CM | POA: Diagnosis not present

## 2015-03-11 DIAGNOSIS — Z7951 Long term (current) use of inhaled steroids: Secondary | ICD-10-CM | POA: Diagnosis not present

## 2015-03-11 DIAGNOSIS — E785 Hyperlipidemia, unspecified: Secondary | ICD-10-CM | POA: Diagnosis not present

## 2015-03-11 DIAGNOSIS — I10 Essential (primary) hypertension: Secondary | ICD-10-CM | POA: Insufficient documentation

## 2015-03-11 DIAGNOSIS — E669 Obesity, unspecified: Secondary | ICD-10-CM | POA: Insufficient documentation

## 2015-03-11 DIAGNOSIS — K219 Gastro-esophageal reflux disease without esophagitis: Secondary | ICD-10-CM | POA: Diagnosis not present

## 2015-03-11 DIAGNOSIS — Z794 Long term (current) use of insulin: Secondary | ICD-10-CM | POA: Insufficient documentation

## 2015-03-11 DIAGNOSIS — E1169 Type 2 diabetes mellitus with other specified complication: Secondary | ICD-10-CM | POA: Insufficient documentation

## 2015-03-11 DIAGNOSIS — K3184 Gastroparesis: Secondary | ICD-10-CM | POA: Diagnosis not present

## 2015-03-11 DIAGNOSIS — I4891 Unspecified atrial fibrillation: Secondary | ICD-10-CM | POA: Diagnosis not present

## 2015-03-11 DIAGNOSIS — Z8669 Personal history of other diseases of the nervous system and sense organs: Secondary | ICD-10-CM | POA: Diagnosis not present

## 2015-03-11 DIAGNOSIS — Z79899 Other long term (current) drug therapy: Secondary | ICD-10-CM | POA: Diagnosis not present

## 2015-03-11 DIAGNOSIS — J45909 Unspecified asthma, uncomplicated: Secondary | ICD-10-CM | POA: Diagnosis not present

## 2015-03-11 DIAGNOSIS — R109 Unspecified abdominal pain: Secondary | ICD-10-CM | POA: Diagnosis present

## 2015-03-11 DIAGNOSIS — E876 Hypokalemia: Secondary | ICD-10-CM | POA: Diagnosis not present

## 2015-03-11 LAB — CBC WITH DIFFERENTIAL/PLATELET
BASOS ABS: 0 10*3/uL (ref 0.0–0.1)
BASOS PCT: 0 %
EOS ABS: 0 10*3/uL (ref 0.0–0.7)
Eosinophils Relative: 0 %
HEMATOCRIT: 30.7 % — AB (ref 36.0–46.0)
HEMOGLOBIN: 10.2 g/dL — AB (ref 12.0–15.0)
Lymphocytes Relative: 14 %
Lymphs Abs: 1.4 10*3/uL (ref 0.7–4.0)
MCH: 28.4 pg (ref 26.0–34.0)
MCHC: 33.2 g/dL (ref 30.0–36.0)
MCV: 85.5 fL (ref 78.0–100.0)
MONOS PCT: 6 %
Monocytes Absolute: 0.6 10*3/uL (ref 0.1–1.0)
NEUTROS ABS: 8 10*3/uL — AB (ref 1.7–7.7)
NEUTROS PCT: 80 %
Platelets: 440 10*3/uL — ABNORMAL HIGH (ref 150–400)
RBC: 3.59 MIL/uL — AB (ref 3.87–5.11)
RDW: 12.2 % (ref 11.5–15.5)
WBC: 10 10*3/uL (ref 4.0–10.5)

## 2015-03-11 LAB — URINALYSIS, ROUTINE W REFLEX MICROSCOPIC
Glucose, UA: >1000 mg/dL — AB
Hgb urine dipstick: NEGATIVE
KETONES UR: 15 mg/dL — AB
LEUKOCYTES UA: NEGATIVE
NITRITE: NEGATIVE
PH: 6 (ref 5.0–8.0)
Protein, ur: 100 mg/dL — AB
Specific Gravity, Urine: 1.038 — ABNORMAL HIGH (ref 1.005–1.030)

## 2015-03-11 LAB — COMPREHENSIVE METABOLIC PANEL
ALBUMIN: 3.6 g/dL (ref 3.5–5.0)
ALK PHOS: 129 U/L — AB (ref 38–126)
ALT: 18 U/L (ref 14–54)
AST: 20 U/L (ref 15–41)
Anion gap: 13 (ref 5–15)
BILIRUBIN TOTAL: 0.8 mg/dL (ref 0.3–1.2)
BUN: 24 mg/dL — AB (ref 6–20)
CALCIUM: 9.8 mg/dL (ref 8.9–10.3)
CO2: 29 mmol/L (ref 22–32)
CREATININE: 0.83 mg/dL (ref 0.44–1.00)
Chloride: 94 mmol/L — ABNORMAL LOW (ref 101–111)
GFR calc Af Amer: >60 mL/min (ref >60)
GFR calc non Af Amer: >60 mL/min (ref >60)
GLUCOSE: 210 mg/dL — AB (ref 65–99)
Potassium: 2.7 mmol/L — CL (ref 3.5–5.1)
SODIUM: 136 mmol/L (ref 135–145)
TOTAL PROTEIN: 8.1 g/dL (ref 6.5–8.1)

## 2015-03-11 LAB — URINE MICROSCOPIC-ADD ON

## 2015-03-11 LAB — LIPASE, BLOOD: Lipase: 22 U/L (ref 11–51)

## 2015-03-11 LAB — CULTURE, GROUP A STREP: Strep A Culture: NEGATIVE

## 2015-03-11 LAB — CBG MONITORING, ED: GLUCOSE-CAPILLARY: 186 mg/dL — AB (ref 65–99)

## 2015-03-11 LAB — MAGNESIUM: Magnesium: 1.9 mg/dL (ref 1.7–2.4)

## 2015-03-11 MED ORDER — FENTANYL CITRATE (PF) 100 MCG/2ML IJ SOLN
100.0000 ug | Freq: Once | INTRAMUSCULAR | Status: AC
  Administered 2015-03-11: 100 ug via INTRAVENOUS
  Filled 2015-03-11: qty 2

## 2015-03-11 MED ORDER — HALOPERIDOL LACTATE 5 MG/ML IJ SOLN
2.5000 mg | Freq: Once | INTRAMUSCULAR | Status: AC
  Administered 2015-03-11: 2.5 mg via INTRAVENOUS
  Filled 2015-03-11: qty 1

## 2015-03-11 MED ORDER — SODIUM CHLORIDE 0.9 % IV BOLUS (SEPSIS)
1000.0000 mL | Freq: Once | INTRAVENOUS | Status: AC
  Administered 2015-03-11: 1000 mL via INTRAVENOUS

## 2015-03-11 MED ORDER — POTASSIUM CHLORIDE CRYS ER 20 MEQ PO TBCR
80.0000 meq | EXTENDED_RELEASE_TABLET | Freq: Once | ORAL | Status: AC
  Administered 2015-03-11: 80 meq via ORAL
  Filled 2015-03-11: qty 4

## 2015-03-11 MED ORDER — POTASSIUM CHLORIDE 10 MEQ/100ML IV SOLN
10.0000 meq | INTRAVENOUS | Status: AC
  Administered 2015-03-11 (×2): 10 meq via INTRAVENOUS
  Filled 2015-03-11 (×2): qty 100

## 2015-03-11 NOTE — ED Provider Notes (Signed)
CSN: [id number]     Arrival date & time 03/11/15  0541 History   First MD Initiated Contact with Patient 03/11/15 [phone]     Chief Complaint  Patient presents with  . Abdominal Pain     (Consider location/radiation/quality/duration/timing/severity/associated sxs/prior Treatment) HPI  This is a 49 year old female with a history of diabetes and diabetic gastroparesis with multiple visits to the ED for the same. This is her fifth visit this month. She is here with abdominal pain which began overnight and worsened. She states it is a 10 out of 10. The pain is crampy and worse with movement or palpation. There has been some associated nausea and vomiting but not as much as with other episodes. She took Zofran about 2 hours ago without relief. She denies diarrhea but states she has not had a bowel movement in 2 weeks. She denies polyuria or polydipsia. She has had decreased oral intake. She is not aware of having a fever and her temperature was normal on arrival. She was noted to be tachycardic on arrival.  Past Medical History  Diagnosis Date  . Asthma   . Diabetes mellitus   . Hyperlipidemia   . Hypertension   . GERD (gastroesophageal reflux disease)   . Cerebellar tumor ([place])   . Obesity   . Sleep apnea   . A-fib ([place])   . Thyroid disease   . Chest pain 03/27/2014    negative lexiscan myoview   Past Surgical History  Procedure Laterality Date  . Cholecystectomy    . Partial hysterectomy      ovaries remain  . Nasal sinus surgery      Dr. Lyn Hollingshead  . Abdominal hysterectomy     Family History  Problem Relation Age of Onset  . Coronary artery disease Father   . Hypertension Father   . Diabetes      maternal grandparents  . Hyperlipidemia Other    Social History  Substance Use Topics  . Smoking status: Never Smoker   . Smokeless tobacco: Never Used  . Alcohol Use: No   OB History    No data available     Review of Systems  All other systems reviewed and are  negative.   Allergies  Review of patient's allergies indicates no known allergies.  Home Medications   Prior to Admission medications   Medication Sig Start Date End Date Taking? Authorizing Provider  albuterol (PROVENTIL HFA;VENTOLIN HFA) 108 (90 BASE) MCG/ACT inhaler Inhale 2 puffs into the lungs every 4 (four) hours as needed for wheezing or shortness of breath.    Historical Provider, MD  ALPRAZolam Duanne Moron) 0.5 MG tablet Take 1 tablet (0.5 mg total) by mouth 2 (two) times daily as needed for anxiety. 10/29/14   Midge Minium, MD  amLODipine (NORVASC) 5 MG tablet Take 5 mg by mouth daily.    Historical Provider, MD  atenolol (TENORMIN) 100 MG tablet Take 1 tablet (100 mg total) by mouth every evening. 10/15/14   Midge Minium, MD  atenolol (TENORMIN) 25 MG tablet Take 1 tablet (25 mg total) by mouth daily as needed. 09/13/14   Sueanne Margarita, MD  atorvastatin (LIPITOR) 80 MG tablet Take 1 tablet (80 mg total) by mouth daily. 11/22/14   Sueanne Margarita, MD  azelastine (OPTIVAR) 0.05 % ophthalmic solution Place 1 drop into both eyes 2 (two) times daily. 10/15/14   Midge Minium, MD  beclomethasone (QVAR) 80 MCG/ACT inhaler Inhale 3 puffs into the lungs daily. Patient  taking differently: Inhale 3 puffs into the lungs daily as needed (wheezing).  10/12/14   Colon Branch, MD  canagliflozin (INVOKANA) 300 MG TABS tablet Take 300 mg by mouth daily before breakfast.    Historical Provider, MD  cetirizine (ZYRTEC) 10 MG tablet Take 1 tablet (10 mg total) by mouth daily. 10/15/14   Midge Minium, MD  cloNIDine (CATAPRES) 0.1 MG tablet Take 1 tablet (0.1 mg total) by mouth 2 (two) times daily. 12/20/14   Midge Minium, MD  dicyclomine (BENTYL) 20 MG tablet Take 1 tablet (20 mg total) by mouth 2 (two) times daily. 12/12/14   Dorie Rank, MD  escitalopram (LEXAPRO) 10 MG tablet Take 1 tablet (10 mg total) by mouth daily. 10/15/14   Midge Minium, MD  glipiZIDE (GLUCOTROL XL) 10 MG 24 hr  tablet Take 10 mg by mouth daily with breakfast.    Historical Provider, MD  glucose blood test strip 1 each by Other route as needed. Onetouch ultra test strip     Historical Provider, MD  insulin aspart (NOVOLOG FLEXPEN) 100 UNIT/ML FlexPen Inject 6 Units into the skin See admin instructions. Per sliding scale 01/30/15   Historical Provider, MD  insulin glargine (LANTUS) 100 UNIT/ML injection Inject 32 Units into the skin every evening.    Historical Provider, MD  IRON PO Take 2 tablets by mouth 3 (three) times a week.     Historical Provider, MD  LANTUS SOLOSTAR 100 UNIT/ML Solostar Pen Inject 20 Units into the skin 4 (four) times daily -  before meals and at bedtime. AS DIRECTED 02/27/14   Historical Provider, MD  lisinopril-hydrochlorothiazide (PRINZIDE,ZESTORETIC) 20-12.5 MG per tablet Take 1 tablet by mouth 2 (two) times daily. 11/26/14   Midge Minium, MD  metFORMIN (GLUCOPHAGE) 1000 MG tablet Take 1,000 mg by mouth 2 (two) times daily with a meal.    Historical Provider, MD  methimazole (TAPAZOLE) 5 MG tablet Take 7.5 mg by mouth every evening.  02/27/14   Historical Provider, MD  metoCLOPramide (REGLAN) 10 MG tablet Take 1 tablet (10 mg total) by mouth every 6 (six) hours as needed for nausea (nausea/headache). 12/11/14   Orpah Greek, MD  metoCLOPramide (REGLAN) 5 MG tablet Take 1 tablet (5 mg total) by mouth 3 (three) times daily before meals. 02/09/15   Thurnell Lose, MD  Multiple Vitamin (MULTIVITAMIN WITH MINERALS) TABS tablet Take 1 tablet by mouth every evening.     Historical Provider, MD  Omega-3 Fatty Acids (FISH OIL) 1000 MG CAPS Take 2,000 mg by mouth every evening.     Historical Provider, MD  ondansetron (ZOFRAN ODT) 4 MG disintegrating tablet Take 1 tablet (4 mg total) by mouth every 8 (eight) hours as needed for nausea or vomiting. 02/09/15   Thurnell Lose, MD  ondansetron (ZOFRAN ODT) 4 MG disintegrating tablet Take 1 tablet (4 mg total) by mouth every 8  (eight) hours as needed for nausea or vomiting. 03/08/15   Shawn C Joy, PA-C  pantoprazole (PROTONIX) 40 MG tablet Take 1 tablet (40 mg total) by mouth daily. Patient taking differently: Take 40 mg by mouth 2 (two) times daily.  06/07/14   Midge Minium, MD  polyethylene glycol [facility] / Floria Raveling) packet Take 17 g by mouth daily. 01/30/15   Historical Provider, MD  potassium chloride (K-DUR) 10 MEQ tablet Take 2 tablets (20 mEq total) by mouth 2 (two) times daily. 02/18/15   Veryl Speak, MD  potassium chloride SA (K-DUR,KLOR-CON)  20 MEQ tablet Take 20 mEq by mouth daily as needed (for leg cramping). Take one tablet by mouth daily. 02/26/12   Midge Minium, MD  promethazine (PHENERGAN) 25 MG tablet Take 1 tablet (25 mg total) by mouth every 6 (six) hours as needed for nausea or vomiting. 02/16/15   Pattricia Boss, MD  rivaroxaban (XARELTO) 20 MG TABS tablet Take 20 mg by mouth daily. 07/02/14   Historical Provider, MD  sucralfate (CARAFATE) 1 G tablet Take 1 tablet (1 g total) by mouth 4 (four) times daily -  with meals and at bedtime. 12/20/14   Midge Minium, MD  Vitamin D, Ergocalciferol, (DRISDOL) 50000 UNITS CAPS capsule Take 50,000 Units by mouth every 7 (seven) days.    Historical Provider, MD   BP 150/98 mmHg  Pulse 110  Temp(Src) 98.3 F (36.8 C) (Oral)  Resp 18  Ht 5\' 1"  (1.549 m)  Wt 150 lb (68.04 kg)  BMI 28.36 kg/m2  SpO2 100%   Physical Exam  General: Well-developed, well-nourished female in no acute distress; appearance consistent with age of record HENT: normocephalic; atraumatic Eyes: pupils equal, round and reactive to light; extraocular muscles intact Neck: supple Heart: regular rate and rhythm; tachycardia Lungs: clear to auscultation bilaterally Abdomen: soft; nondistended; diffuse tenderness; bowel sounds present Extremities: No deformity; full range of motion; pulses normal Neurologic: Awake, alert and oriented; motor function intact in all extremities  and symmetric; no facial droop Skin: Warm and dry Psychiatric: Flat affect    ED Course  Procedures (including critical care time)   MDM   Nursing notes and vitals signs, including pulse oximetry, reviewed.  Summary of this visit's results, reviewed by myself:  Labs:  Results for orders placed or performed during the hospital encounter of 03/11/15 (from the past 24 hour(s))  CBG monitoring, ED     Status: Abnormal   Collection Time: 03/11/15  6:00 AM  Result Value Ref Range   Glucose-Capillary 186 (H) 65 - 99 mg/dL   6:59 AM Awaiting IV access. Dr. Oleta Mouse to follow up on results and make disposition.    Shanon Rosser, MD 03/11/15 0700

## 2015-03-11 NOTE — ED Notes (Signed)
Patient states she took her home meds for nausea and pain with no relief. ABD pain currently, patient vomited in triage. Pt states she has not been able to eat much.

## 2015-03-11 NOTE — ED Notes (Signed)
I too CBG and got 186 mg./dcltr.

## 2015-03-11 NOTE — Discharge Instructions (Signed)
Return for worsening symptoms, including worsening pain, fever, vomiting and unable to keep down food/fluids, or any other symptoms concerning to you.  Gastroparesis Gastroparesis, also called delayed gastric emptying, is a condition in which food takes longer than normal to empty from the stomach. The condition is usually long-lasting (chronic). CAUSES This condition may be caused by:  An endocrine disorder, such as hypothyroidism or diabetes. Diabetes is the most common cause of this condition.  A nervous system disease, such as Parkinson disease or multiple sclerosis.  Cancer, infection, or surgery of the stomach or vagus nerve.  A connective tissue disorder, such as scleroderma.  Certain medicines. In most cases, the cause is not known. RISK FACTORS This condition is more likely to develop in:  People with certain disorders, including endocrine disorders, eating disorders, amyloidosis, and scleroderma.  People with certain diseases, including Parkinson disease or multiple sclerosis.  People with cancer or infection of the stomach or vagus nerve.  People who have had surgery on the stomach or vagus nerve.  People who take certain medicines.  Women. SYMPTOMS Symptoms of this condition include:  An early feeling of fullness when eating.  Nausea.  Weight loss.  Vomiting.  Heartburn.  Abdominal bloating.  Inconsistent blood glucose levels.  Lack of appetite.  Acid from the stomach coming up into the esophagus (gastroesophageal reflux).  Spasms of the stomach. Symptoms may come and go. DIAGNOSIS This condition is diagnosed with tests, such as:  Tests that check how long it takes food to move through the stomach and intestines. These tests include:  Upper gastrointestinal (GI) series. In this test, X-rays of the intestines are taken after you drink a liquid. The liquid makes the intestines show up better on the X-rays.  Gastric emptying scintigraphy. In this  test, scans are taken after you eat food that contains a small amount of radioactive material.  Wireless capsule GI monitoring system. This test involves swallowing a capsule that records information about movement through the stomach.  Gastric manometry. This test measures electrical and muscular activity in the stomach. It is done with a thin tube that is passed down the throat and into the stomach.  Endoscopy. This test checks for abnormalities in the lining of the stomach. It is done with a long, thin tube that is passed down the throat and into the stomach.  An ultrasound. This test can help rule out gallbladder disease or pancreatitis as a cause of your symptoms. It uses sound waves to take pictures of the inside of your body. TREATMENT There is no cure for gastroparesis. This condition may be managed with:  Treatment of the underlying condition causing the gastroparesis.  Lifestyle changes, including exercise and dietary changes. Dietary changes can include:  Changes in what and when you eat.  Eating smaller meals more often.  Eating low-fat foods.  Eating low-fiber forms of high-fiber foods, such as cooked vegetables instead of raw vegetables.  Having liquid foods in place of solid foods. Liquid foods are easier to digest.  Medicines. These may be given to control nausea and vomiting and to stimulate stomach muscles.  Getting food through a feeding tube. This may be done in severe cases.  A gastric neurostimulator. This is a device that is inserted into the body with surgery. It helps improve stomach emptying and control nausea and vomiting. HOME CARE INSTRUCTIONS  Follow your health care provider's instructions about exercise and diet.  Take medicines only as directed by your health care provider. [place]  CARE IF:  Your symptoms do not improve with treatment.  You have new symptoms. SEEK IMMEDIATE MEDICAL CARE IF:  You have severe abdominal pain that does not  improve with treatment.  You have nausea that does not go away.  You cannot keep fluids down.   This information is not intended to replace advice given to you by your health care provider. Make sure you discuss any questions you have with your health care provider.   Document Released: 04/06/2005 Document Revised: 08/21/2014 Document Reviewed: 04/02/2014 Elsevier Interactive Patient Education [employer].

## 2015-03-11 NOTE — ED Provider Notes (Addendum)
49 year old with abdominal pain. Please see previous physicians note regarding patient's presenting history and physical, initial ED course, and associated medical decision making. She has a history of diabetes with recurrent ED visits for gastroparesis, and had presented with nausea and vomiting with abdominal pain that is similar to prior episodes of gastroparesis. At time of sign out, she is pending blood work and reevaluation after treatment with Haldol and fentanyl.  No evidence of DKA on blood work, and she has normal anion gap and bicarbonate. Is hypokalemic to 2.7. Normal magnesium. She is repleted with 20 mEq of IV potassium and 80 mEQ of oral potassium. EKG without significant QT prolongation. Symptoms improved and she is able to tolerate PO intake. Abdomen soft and benign on my re-evaluation. Appropriate for discharge home. Strict return and follow-up instructions reviewed. She expressed understanding of all discharge instructions and felt comfortable with the plan of care.   Forde Dandy, MD 03/11/15 1914  Forde Dandy, MD 03/11/15 [id number]

## 2015-03-11 NOTE — ED Notes (Signed)
Urine at bedside. 

## 2015-03-28 ENCOUNTER — Encounter (HOSPITAL_BASED_OUTPATIENT_CLINIC_OR_DEPARTMENT_OTHER): Payer: Self-pay

## 2015-03-28 ENCOUNTER — Emergency Department (HOSPITAL_BASED_OUTPATIENT_CLINIC_OR_DEPARTMENT_OTHER)
Admission: EM | Admit: 2015-03-28 | Discharge: 2015-03-28 | Disposition: A | Discharge status: Home / Self Care | Payer: Medicaid Other | Attending: Emergency Medicine | Admitting: Emergency Medicine

## 2015-03-28 DIAGNOSIS — Z7951 Long term (current) use of inhaled steroids: Secondary | ICD-10-CM | POA: Insufficient documentation

## 2015-03-28 DIAGNOSIS — R109 Unspecified abdominal pain: Secondary | ICD-10-CM | POA: Diagnosis present

## 2015-03-28 DIAGNOSIS — J45909 Unspecified asthma, uncomplicated: Secondary | ICD-10-CM | POA: Diagnosis not present

## 2015-03-28 DIAGNOSIS — K3184 Gastroparesis: Secondary | ICD-10-CM

## 2015-03-28 DIAGNOSIS — Z79899 Other long term (current) drug therapy: Secondary | ICD-10-CM | POA: Insufficient documentation

## 2015-03-28 DIAGNOSIS — Z86018 Personal history of other benign neoplasm: Secondary | ICD-10-CM | POA: Diagnosis not present

## 2015-03-28 DIAGNOSIS — E079 Disorder of thyroid, unspecified: Secondary | ICD-10-CM | POA: Diagnosis not present

## 2015-03-28 DIAGNOSIS — E1143 Type 2 diabetes mellitus with diabetic autonomic (poly)neuropathy: Secondary | ICD-10-CM | POA: Insufficient documentation

## 2015-03-28 DIAGNOSIS — I10 Essential (primary) hypertension: Secondary | ICD-10-CM | POA: Diagnosis not present

## 2015-03-28 DIAGNOSIS — Z8669 Personal history of other diseases of the nervous system and sense organs: Secondary | ICD-10-CM | POA: Diagnosis not present

## 2015-03-28 DIAGNOSIS — Z794 Long term (current) use of insulin: Secondary | ICD-10-CM | POA: Insufficient documentation

## 2015-03-28 DIAGNOSIS — E669 Obesity, unspecified: Secondary | ICD-10-CM | POA: Insufficient documentation

## 2015-03-28 DIAGNOSIS — K219 Gastro-esophageal reflux disease without esophagitis: Secondary | ICD-10-CM | POA: Insufficient documentation

## 2015-03-28 DIAGNOSIS — Z7901 Long term (current) use of anticoagulants: Secondary | ICD-10-CM | POA: Diagnosis not present

## 2015-03-28 DIAGNOSIS — E785 Hyperlipidemia, unspecified: Secondary | ICD-10-CM | POA: Diagnosis not present

## 2015-03-28 DIAGNOSIS — Z7984 Long term (current) use of oral hypoglycemic drugs: Secondary | ICD-10-CM | POA: Insufficient documentation

## 2015-03-28 LAB — CBC
HCT: 30.5 % — ABNORMAL LOW (ref 36.0–46.0)
Hemoglobin: 9.8 g/dL — ABNORMAL LOW (ref 12.0–15.0)
MCH: 28.4 pg (ref 26.0–34.0)
MCHC: 32.1 g/dL (ref 30.0–36.0)
MCV: 88.4 fL (ref 78.0–100.0)
PLATELETS: 432 10*3/uL — AB (ref 150–400)
RBC: 3.45 MIL/uL — AB (ref 3.87–5.11)
RDW: 14 % (ref 11.5–15.5)
WBC: 6.7 10*3/uL (ref 4.0–10.5)

## 2015-03-28 LAB — COMPREHENSIVE METABOLIC PANEL
ALT: 25 U/L (ref 14–54)
AST: 22 U/L (ref 15–41)
Albumin: 4 g/dL (ref 3.5–5.0)
Alkaline Phosphatase: 119 U/L (ref 38–126)
Anion gap: 12 (ref 5–15)
BUN: 15 mg/dL (ref 6–20)
CALCIUM: 9.3 mg/dL (ref 8.9–10.3)
CHLORIDE: 101 mmol/L (ref 101–111)
CO2: 24 mmol/L (ref 22–32)
Creatinine, Ser: 0.65 mg/dL (ref 0.44–1.00)
GFR calc Af Amer: >60 mL/min (ref >60)
GFR calc non Af Amer: >60 mL/min (ref >60)
Glucose, Bld: 289 mg/dL — ABNORMAL HIGH (ref 65–99)
POTASSIUM: 3.6 mmol/L (ref 3.5–5.1)
SODIUM: 137 mmol/L (ref 135–145)
TOTAL PROTEIN: 8.1 g/dL (ref 6.5–8.1)
Total Bilirubin: 1 mg/dL (ref 0.3–1.2)

## 2015-03-28 MED ORDER — HALOPERIDOL LACTATE 5 MG/ML IJ SOLN
5.0000 mg | Freq: Once | INTRAMUSCULAR | Status: AC
  Administered 2015-03-28: 5 mg via INTRAVENOUS
  Filled 2015-03-28: qty 1

## 2015-03-28 MED ORDER — PANTOPRAZOLE SODIUM 40 MG IV SOLR
40.0000 mg | Freq: Once | INTRAVENOUS | Status: AC
  Administered 2015-03-28: 40 mg via INTRAVENOUS
  Filled 2015-03-28: qty 40

## 2015-03-28 MED ORDER — SODIUM CHLORIDE 0.9 % IV SOLN: INTRAVENOUS | Status: DC

## 2015-03-28 MED ORDER — SODIUM CHLORIDE 0.9 % IV BOLUS (SEPSIS)
1000.0000 mL | Freq: Once | INTRAVENOUS | Status: AC
  Administered 2015-03-28: 1000 mL via INTRAVENOUS

## 2015-03-28 MED ORDER — METOCLOPRAMIDE HCL 5 MG/ML IJ SOLN
10.0000 mg | Freq: Once | INTRAMUSCULAR | Status: AC
  Administered 2015-03-28: 10 mg via INTRAVENOUS
  Filled 2015-03-28: qty 2

## 2015-03-28 MED ORDER — ONDANSETRON HCL 4 MG/2ML IJ SOLN
4.0000 mg | Freq: Once | INTRAMUSCULAR | Status: AC
  Administered 2015-03-28: 4 mg via INTRAVENOUS
  Filled 2015-03-28: qty 2

## 2015-03-28 NOTE — Discharge Instructions (Signed)
Continue your current medications. No significant lab abnormalities noted today. Blood sugar was running a little high. Follow-up with your doctor. Return for any new or worse symptoms.

## 2015-03-28 NOTE — ED Notes (Signed)
MD at bedside. 

## 2015-03-28 NOTE — ED Notes (Signed)
Family at bedside. 

## 2015-03-28 NOTE — ED Provider Notes (Signed)
CSN: [id number]     Arrival date & time 03/28/15  1646 History   First MD Initiated Contact with Patient 03/28/15 1723     Chief Complaint  Patient presents with  . Abdominal Pain     (Consider location/radiation/quality/duration/timing/severity/associated sxs/prior Treatment) Patient is a 49 y.o. female presenting with abdominal pain. The history is provided by the patient.  Abdominal Pain Associated symptoms: nausea and vomiting   Associated symptoms: no chest pain, no dysuria, no fever and no shortness of breath    patient seen frequently for gastroparesis and persistent nausea vomiting abdominal pain. Patient last seen November 21. Patient states that sudden onset of the abdominal pain and persistent nausea and vomiting at 7 this morning. No blood in the vomit. Symptoms very similar to her past symptoms. The abdominal pain is all over. Rated 10 out of 10.  Past Medical History  Diagnosis Date  . Asthma   . Diabetes mellitus   . Hyperlipidemia   . Hypertension   . GERD (gastroesophageal reflux disease)   . Cerebellar tumor ([place])   . Obesity   . Sleep apnea   . A-fib ([place])   . Thyroid disease   . Chest pain 03/27/2014    negative lexiscan myoview   Past Surgical History  Procedure Laterality Date  . Cholecystectomy    . Partial hysterectomy      ovaries remain  . Nasal sinus surgery      Dr. Lyn Hollingshead  . Abdominal hysterectomy     Family History  Problem Relation Age of Onset  . Coronary artery disease Father   . Hypertension Father   . Diabetes      maternal grandparents  . Hyperlipidemia Other    Social History  Substance Use Topics  . Smoking status: Never Smoker   . Smokeless tobacco: Never Used  . Alcohol Use: No   OB History    No data available     Review of Systems  Constitutional: Negative for fever.  HENT: Negative for congestion.   Eyes: Negative for redness.  Respiratory: Negative for shortness of breath.   Cardiovascular: Negative for  chest pain.  Gastrointestinal: Positive for nausea, vomiting and abdominal pain.  Genitourinary: Negative for dysuria.  Musculoskeletal: Negative for back pain.  Skin: Negative for rash.  Neurological: Negative for syncope.  Hematological: Does not bruise/bleed easily.  Psychiatric/Behavioral: Negative for confusion.      Allergies  Review of patient's allergies indicates no known allergies.  Home Medications   Prior to Admission medications   Medication Sig Start Date End Date Taking? Authorizing Provider  albuterol (PROVENTIL HFA;VENTOLIN HFA) 108 (90 BASE) MCG/ACT inhaler Inhale 2 puffs into the lungs every 4 (four) hours as needed for wheezing or shortness of breath.    Historical Provider, MD  ALPRAZolam Duanne Moron) 0.5 MG tablet Take 1 tablet (0.5 mg total) by mouth 2 (two) times daily as needed for anxiety. 10/29/14   Midge Minium, MD  amLODipine (NORVASC) 5 MG tablet Take 5 mg by mouth daily.    Historical Provider, MD  atenolol (TENORMIN) 100 MG tablet Take 1 tablet (100 mg total) by mouth every evening. 10/15/14   Midge Minium, MD  atenolol (TENORMIN) 25 MG tablet Take 1 tablet (25 mg total) by mouth daily as needed. 09/13/14   Sueanne Margarita, MD  atorvastatin (LIPITOR) 80 MG tablet Take 1 tablet (80 mg total) by mouth daily. 11/22/14   Sueanne Margarita, MD  azelastine (OPTIVAR) 0.05 % ophthalmic  solution Place 1 drop into both eyes 2 (two) times daily. 10/15/14   Midge Minium, MD  beclomethasone (QVAR) 80 MCG/ACT inhaler Inhale 3 puffs into the lungs daily. Patient taking differently: Inhale 3 puffs into the lungs daily as needed (wheezing).  10/12/14   Colon Branch, MD  canagliflozin (INVOKANA) 300 MG TABS tablet Take 300 mg by mouth daily before breakfast.    Historical Provider, MD  cetirizine (ZYRTEC) 10 MG tablet Take 1 tablet (10 mg total) by mouth daily. 10/15/14   Midge Minium, MD  cloNIDine (CATAPRES) 0.1 MG tablet Take 1 tablet (0.1 mg total) by mouth 2  (two) times daily. 12/20/14   Midge Minium, MD  dicyclomine (BENTYL) 20 MG tablet Take 1 tablet (20 mg total) by mouth 2 (two) times daily. 12/12/14   Dorie Rank, MD  escitalopram (LEXAPRO) 10 MG tablet Take 1 tablet (10 mg total) by mouth daily. 10/15/14   Midge Minium, MD  glipiZIDE (GLUCOTROL XL) 10 MG 24 hr tablet Take 10 mg by mouth daily with breakfast.    Historical Provider, MD  glucose blood test strip 1 each by Other route as needed. Onetouch ultra test strip     Historical Provider, MD  insulin aspart (NOVOLOG FLEXPEN) 100 UNIT/ML FlexPen Inject 6 Units into the skin See admin instructions. Per sliding scale 01/30/15   Historical Provider, MD  insulin glargine (LANTUS) 100 UNIT/ML injection Inject 32 Units into the skin every evening.    Historical Provider, MD  IRON PO Take 2 tablets by mouth 3 (three) times a week.     Historical Provider, MD  LANTUS SOLOSTAR 100 UNIT/ML Solostar Pen Inject 20 Units into the skin 4 (four) times daily -  before meals and at bedtime. AS DIRECTED 02/27/14   Historical Provider, MD  lisinopril-hydrochlorothiazide (PRINZIDE,ZESTORETIC) 20-12.5 MG per tablet Take 1 tablet by mouth 2 (two) times daily. 11/26/14   Midge Minium, MD  metFORMIN (GLUCOPHAGE) 1000 MG tablet Take 1,000 mg by mouth 2 (two) times daily with a meal.    Historical Provider, MD  methimazole (TAPAZOLE) 5 MG tablet Take 7.5 mg by mouth every evening.  02/27/14   Historical Provider, MD  metoCLOPramide (REGLAN) 10 MG tablet Take 1 tablet (10 mg total) by mouth every 6 (six) hours as needed for nausea (nausea/headache). 12/11/14   Orpah Greek, MD  metoCLOPramide (REGLAN) 5 MG tablet Take 1 tablet (5 mg total) by mouth 3 (three) times daily before meals. 02/09/15   Thurnell Lose, MD  Multiple Vitamin (MULTIVITAMIN WITH MINERALS) TABS tablet Take 1 tablet by mouth every evening.     Historical Provider, MD  Omega-3 Fatty Acids (FISH OIL) 1000 MG CAPS Take 2,000 mg by mouth  every evening.     Historical Provider, MD  ondansetron (ZOFRAN ODT) 4 MG disintegrating tablet Take 1 tablet (4 mg total) by mouth every 8 (eight) hours as needed for nausea or vomiting. 02/09/15   Thurnell Lose, MD  ondansetron (ZOFRAN ODT) 4 MG disintegrating tablet Take 1 tablet (4 mg total) by mouth every 8 (eight) hours as needed for nausea or vomiting. 03/08/15   Shawn C Joy, PA-C  pantoprazole (PROTONIX) 40 MG tablet Take 1 tablet (40 mg total) by mouth daily. Patient taking differently: Take 40 mg by mouth 2 (two) times daily.  06/07/14   Midge Minium, MD  polyethylene glycol [facility] / Floria Raveling) packet Take 17 g by mouth daily. 01/30/15   Historical  Provider, MD  potassium chloride (K-DUR) 10 MEQ tablet Take 2 tablets (20 mEq total) by mouth 2 (two) times daily. 02/18/15   Veryl Speak, MD  potassium chloride SA (K-DUR,KLOR-CON) 20 MEQ tablet Take 20 mEq by mouth daily as needed (for leg cramping). Take one tablet by mouth daily. 02/26/12   Midge Minium, MD  promethazine (PHENERGAN) 25 MG tablet Take 1 tablet (25 mg total) by mouth every 6 (six) hours as needed for nausea or vomiting. 02/16/15   Pattricia Boss, MD  rivaroxaban (XARELTO) 20 MG TABS tablet Take 20 mg by mouth daily. 07/02/14   Historical Provider, MD  sucralfate (CARAFATE) 1 G tablet Take 1 tablet (1 g total) by mouth 4 (four) times daily -  with meals and at bedtime. 12/20/14   Midge Minium, MD  Vitamin D, Ergocalciferol, (DRISDOL) 50000 UNITS CAPS capsule Take 50,000 Units by mouth every 7 (seven) days.    Historical Provider, MD   BP 214/93 mmHg  Pulse 107  Temp(Src) 98.9 F (37.2 C) (Oral)  Resp 20  Ht 5\' 1"  (1.549 m)  Wt 68.04 kg  BMI 28.36 kg/m2  SpO2 96% Physical Exam  Constitutional: She is oriented to person, place, and time. She appears well-developed and well-nourished. She appears distressed.  HENT:  Head: Normocephalic and atraumatic.  Mouth/Throat: Oropharynx is clear and moist.  Eyes:  Conjunctivae and EOM are normal. Pupils are equal, round, and reactive to light.  Neck: Normal range of motion. Neck supple.  Cardiovascular: Normal rate, regular rhythm and normal heart sounds.   No murmur heard. Pulmonary/Chest: Effort normal and breath sounds normal. No respiratory distress.  Abdominal: Soft. Bowel sounds are normal. There is no tenderness.  Musculoskeletal: Normal range of motion.  Neurological: She is alert and oriented to person, place, and time. No cranial nerve deficit. She exhibits normal muscle tone. Coordination normal.  Skin: Skin is warm. No rash noted.  Nursing note and vitals reviewed.   ED Course  Procedures (including critical care time) Labs Review Labs Reviewed  CBC - Abnormal; Notable for the following:    RBC 3.45 (*)    Hemoglobin 9.8 (*)    HCT 30.5 (*)    Platelets 432 (*)    All other components within normal limits  COMPREHENSIVE METABOLIC PANEL - Abnormal; Notable for the following:    Glucose, Bld 289 (*)    All other components within normal limits   Results for orders placed or performed during the hospital encounter of 03/28/15  CBC  Result Value Ref Range   WBC 6.7 4.0 - 10.5 K/uL   RBC 3.45 (L) 3.87 - 5.11 MIL/uL   Hemoglobin 9.8 (L) 12.0 - 15.0 g/dL   HCT 30.5 (L) 36.0 - 46.0 %   MCV 88.4 78.0 - 100.0 fL   MCH 28.4 26.0 - 34.0 pg   MCHC 32.1 30.0 - 36.0 g/dL   RDW 14.0 11.5 - 15.5 %   Platelets 432 (H) 150 - 400 K/uL  Comprehensive metabolic panel  Result Value Ref Range   Sodium 137 135 - 145 mmol/L   Potassium 3.6 3.5 - 5.1 mmol/L   Chloride 101 101 - 111 mmol/L   CO2 24 22 - 32 mmol/L   Glucose, Bld 289 (H) 65 - 99 mg/dL   BUN 15 6 - 20 mg/dL   Creatinine, Ser 0.65 0.44 - 1.00 mg/dL   Calcium 9.3 8.9 - 10.3 mg/dL   Total Protein 8.1 6.5 - 8.1 g/dL  Albumin 4.0 3.5 - 5.0 g/dL   AST 22 15 - 41 U/L   ALT 25 14 - 54 U/L   Alkaline Phosphatase 119 38 - 126 U/L   Total Bilirubin 1.0 0.3 - 1.2 mg/dL   GFR calc non  Af Amer >60 >60 mL/min   GFR calc Af Amer >60 >60 mL/min   Anion gap 12 5 - 15     Imaging Review No results found. I have personally reviewed and evaluated these images and lab results as part of my medical decision-making.   EKG Interpretation None      MDM   Final diagnoses:  Diabetic gastroparesis ([place])    Patient seen frequently for vomiting and abdominal pain. Past diagnosis of being consistent with gastroparesis. Patient with onset of symptoms today multiple episodes of vomiting. Patient treated here with Reglan Haldol and Protonix and Zofran also vomiting has resolved. Patient is comfortable. No significant electrolyte abnormalities. Other than hyperglycemia but no evidence of any metabolic acidosis. Patient stable for discharge home we'll continue her current medications.    Fredia Sorrow, MD 03/28/15 1859

## 2015-03-28 NOTE — ED Notes (Signed)
Abd pain and vomiting x today

## 2015-03-30 ENCOUNTER — Encounter (HOSPITAL_BASED_OUTPATIENT_CLINIC_OR_DEPARTMENT_OTHER): Payer: Self-pay | Admitting: Emergency Medicine

## 2015-03-30 ENCOUNTER — Emergency Department (HOSPITAL_BASED_OUTPATIENT_CLINIC_OR_DEPARTMENT_OTHER)
Admission: EM | Admit: 2015-03-30 | Discharge: 2015-03-31 | Disposition: A | Discharge status: Home / Self Care | Payer: Medicaid Other | Source: Home / Self Care | Attending: Emergency Medicine | Admitting: Emergency Medicine

## 2015-03-30 ENCOUNTER — Encounter (HOSPITAL_BASED_OUTPATIENT_CLINIC_OR_DEPARTMENT_OTHER): Payer: Self-pay | Admitting: *Deleted

## 2015-03-30 ENCOUNTER — Emergency Department (HOSPITAL_BASED_OUTPATIENT_CLINIC_OR_DEPARTMENT_OTHER)
Admission: EM | Admit: 2015-03-30 | Discharge: 2015-03-30 | Disposition: A | Discharge status: Home / Self Care | Payer: Medicaid Other | Attending: Emergency Medicine | Admitting: Emergency Medicine

## 2015-03-30 ENCOUNTER — Emergency Department (HOSPITAL_BASED_OUTPATIENT_CLINIC_OR_DEPARTMENT_OTHER): Payer: Medicaid Other

## 2015-03-30 DIAGNOSIS — E1143 Type 2 diabetes mellitus with diabetic autonomic (poly)neuropathy: Secondary | ICD-10-CM

## 2015-03-30 DIAGNOSIS — Z794 Long term (current) use of insulin: Secondary | ICD-10-CM | POA: Insufficient documentation

## 2015-03-30 DIAGNOSIS — K219 Gastro-esophageal reflux disease without esophagitis: Secondary | ICD-10-CM | POA: Insufficient documentation

## 2015-03-30 DIAGNOSIS — E119 Type 2 diabetes mellitus without complications: Secondary | ICD-10-CM | POA: Insufficient documentation

## 2015-03-30 DIAGNOSIS — I10 Essential (primary) hypertension: Secondary | ICD-10-CM | POA: Insufficient documentation

## 2015-03-30 DIAGNOSIS — E669 Obesity, unspecified: Secondary | ICD-10-CM | POA: Insufficient documentation

## 2015-03-30 DIAGNOSIS — Z8669 Personal history of other diseases of the nervous system and sense organs: Secondary | ICD-10-CM | POA: Insufficient documentation

## 2015-03-30 DIAGNOSIS — E785 Hyperlipidemia, unspecified: Secondary | ICD-10-CM | POA: Insufficient documentation

## 2015-03-30 DIAGNOSIS — Z862 Personal history of diseases of the blood and blood-forming organs and certain disorders involving the immune mechanism: Secondary | ICD-10-CM | POA: Diagnosis not present

## 2015-03-30 DIAGNOSIS — Z7901 Long term (current) use of anticoagulants: Secondary | ICD-10-CM | POA: Insufficient documentation

## 2015-03-30 DIAGNOSIS — K3184 Gastroparesis: Secondary | ICD-10-CM

## 2015-03-30 DIAGNOSIS — Z79899 Other long term (current) drug therapy: Secondary | ICD-10-CM | POA: Insufficient documentation

## 2015-03-30 DIAGNOSIS — I4891 Unspecified atrial fibrillation: Secondary | ICD-10-CM | POA: Insufficient documentation

## 2015-03-30 DIAGNOSIS — J45909 Unspecified asthma, uncomplicated: Secondary | ICD-10-CM

## 2015-03-30 DIAGNOSIS — K5900 Constipation, unspecified: Secondary | ICD-10-CM

## 2015-03-30 DIAGNOSIS — R112 Nausea with vomiting, unspecified: Secondary | ICD-10-CM | POA: Diagnosis present

## 2015-03-30 DIAGNOSIS — Z86018 Personal history of other benign neoplasm: Secondary | ICD-10-CM

## 2015-03-30 LAB — LIPASE, BLOOD: Lipase: 23 U/L (ref 11–51)

## 2015-03-30 LAB — COMPREHENSIVE METABOLIC PANEL
ALT: 19 U/L (ref 14–54)
AST: 14 U/L — AB (ref 15–41)
Albumin: 4 g/dL (ref 3.5–5.0)
Alkaline Phosphatase: 107 U/L (ref 38–126)
Anion gap: 13 (ref 5–15)
BUN: 21 mg/dL — AB (ref 6–20)
CHLORIDE: 99 mmol/L — AB (ref 101–111)
CO2: 25 mmol/L (ref 22–32)
Calcium: 9 mg/dL (ref 8.9–10.3)
Creatinine, Ser: 0.81 mg/dL (ref 0.44–1.00)
Glucose, Bld: 268 mg/dL — ABNORMAL HIGH (ref 65–99)
POTASSIUM: 3.7 mmol/L (ref 3.5–5.1)
SODIUM: 137 mmol/L (ref 135–145)
Total Bilirubin: 1.1 mg/dL (ref 0.3–1.2)
Total Protein: 8 g/dL (ref 6.5–8.1)

## 2015-03-30 LAB — CBC WITH DIFFERENTIAL/PLATELET
Basophils Absolute: 0 10*3/uL (ref 0.0–0.1)
Basophils Relative: 1 %
Eosinophils Absolute: 0 10*3/uL (ref 0.0–0.7)
Eosinophils Relative: 0 %
HEMATOCRIT: 30 % — AB (ref 36.0–46.0)
HEMOGLOBIN: 9.6 g/dL — AB (ref 12.0–15.0)
LYMPHS ABS: 1.6 10*3/uL (ref 0.7–4.0)
Lymphocytes Relative: 22 %
MCH: 28.7 pg (ref 26.0–34.0)
MCHC: 32 g/dL (ref 30.0–36.0)
MCV: 89.6 fL (ref 78.0–100.0)
MONOS PCT: 7 %
Monocytes Absolute: 0.5 10*3/uL (ref 0.1–1.0)
NEUTROS PCT: 70 %
Neutro Abs: 5 10*3/uL (ref 1.7–7.7)
Platelets: 440 10*3/uL — ABNORMAL HIGH (ref 150–400)
RBC: 3.35 MIL/uL — ABNORMAL LOW (ref 3.87–5.11)
RDW: 14.6 % (ref 11.5–15.5)
WBC: 7.2 10*3/uL (ref 4.0–10.5)

## 2015-03-30 LAB — CBG MONITORING, ED: GLUCOSE-CAPILLARY: 271 mg/dL — AB (ref 65–99)

## 2015-03-30 MED ORDER — SODIUM CHLORIDE 0.9 % IV BOLUS (SEPSIS)
1000.0000 mL | Freq: Once | INTRAVENOUS | Status: AC
  Administered 2015-03-30: 1000 mL via INTRAVENOUS

## 2015-03-30 MED ORDER — METOCLOPRAMIDE HCL 5 MG/ML IJ SOLN: 10.0000 mg | Freq: Once | INTRAMUSCULAR | Status: DC

## 2015-03-30 MED ORDER — HALOPERIDOL LACTATE 5 MG/ML IJ SOLN
5.0000 mg | Freq: Once | INTRAMUSCULAR | Status: AC
Start: 2015-03-30 — End: 2015-03-30
  Administered 2015-03-30: 5 mg via INTRAVENOUS
  Filled 2015-03-30: qty 1

## 2015-03-30 MED ORDER — METOCLOPRAMIDE HCL 5 MG/ML IJ SOLN
10.0000 mg | Freq: Once | INTRAMUSCULAR | Status: AC
  Administered 2015-03-30: 10 mg via INTRAVENOUS
  Filled 2015-03-30: qty 2

## 2015-03-30 NOTE — ED Notes (Addendum)
Pt seen here 2 days ago for same symptoms. States she was "still vomiting" when she was discharged. States abd pain started after she began vomiting and indicates general stabbing pain to lower abdomen. Reports she was unable to take her medications this morning

## 2015-03-30 NOTE — ED Notes (Signed)
Patient transported to X-ray 

## 2015-03-30 NOTE — ED Notes (Signed)
Vomiting x 3 days with abd pain-

## 2015-03-30 NOTE — ED Notes (Signed)
Pt reports she took zofran at [facility] today

## 2015-03-30 NOTE — ED Notes (Signed)
Pt in for second visit today for abd pain and emesis due to gastroparesis. Pt said she went home and went to sleep and woke up still in pain and is returning for further evaluation.

## 2015-03-30 NOTE — ED Provider Notes (Signed)
CSN: [id number]     Arrival date & time 03/30/15  0907 History   First MD Initiated Contact with Patient 03/30/15 [phone]     Chief Complaint  Patient presents with  . Emesis     (Consider location/radiation/quality/duration/timing/severity/associated sxs/prior Treatment) HPI  49 year old female with vomiting and abdominal pain. Started 3 days ago. Similar to multiple prior flares of gastroparesis per her. Pain is above the umbilicus. Has been having vomiting and has not been able to keep down any fluids. Seen here 2 days ago but states she was still vomiting when released. No bowel movement in 5 days. No fevers, chest pain, shortness of breath, or urinary symptoms. Patient states she is typically given Haldol for her symptoms which usually helps the pain. She is not sure what she is typically given for vomiting. She has been trying Zofran and Reglan at home with no relief.  Past Medical History  Diagnosis Date  . Asthma   . Diabetes mellitus   . Hyperlipidemia   . Hypertension   . GERD (gastroesophageal reflux disease)   . Cerebellar tumor ([place])   . Obesity   . Sleep apnea   . A-fib ([place])   . Thyroid disease   . Chest pain 03/27/2014    negative lexiscan myoview   Past Surgical History  Procedure Laterality Date  . Cholecystectomy    . Partial hysterectomy      ovaries remain  . Nasal sinus surgery      Dr. Lyn Hollingshead  . Abdominal hysterectomy     Family History  Problem Relation Age of Onset  . Coronary artery disease Father   . Hypertension Father   . Diabetes      maternal grandparents  . Hyperlipidemia Other    Social History  Substance Use Topics  . Smoking status: Never Smoker   . Smokeless tobacco: Never Used  . Alcohol Use: No   OB History    No data available     Review of Systems  Constitutional: Negative for fever.  Cardiovascular: Negative for chest pain.  Gastrointestinal: Positive for nausea, vomiting and abdominal pain.  Genitourinary: Negative  for dysuria.  All other systems reviewed and are negative.     Allergies  Review of patient's allergies indicates no known allergies.  Home Medications   Prior to Admission medications   Medication Sig Start Date End Date Taking? Authorizing Provider  albuterol (PROVENTIL HFA;VENTOLIN HFA) 108 (90 BASE) MCG/ACT inhaler Inhale 2 puffs into the lungs every 4 (four) hours as needed for wheezing or shortness of breath.    Historical Provider, MD  ALPRAZolam Duanne Moron) 0.5 MG tablet Take 1 tablet (0.5 mg total) by mouth 2 (two) times daily as needed for anxiety. 10/29/14   Midge Minium, MD  amLODipine (NORVASC) 5 MG tablet Take 5 mg by mouth daily.    Historical Provider, MD  atenolol (TENORMIN) 100 MG tablet Take 1 tablet (100 mg total) by mouth every evening. 10/15/14   Midge Minium, MD  atenolol (TENORMIN) 25 MG tablet Take 1 tablet (25 mg total) by mouth daily as needed. 09/13/14   Sueanne Margarita, MD  atorvastatin (LIPITOR) 80 MG tablet Take 1 tablet (80 mg total) by mouth daily. 11/22/14   Sueanne Margarita, MD  azelastine (OPTIVAR) 0.05 % ophthalmic solution Place 1 drop into both eyes 2 (two) times daily. 10/15/14   Midge Minium, MD  beclomethasone (QVAR) 80 MCG/ACT inhaler Inhale 3 puffs into the lungs daily. Patient taking  differently: Inhale 3 puffs into the lungs daily as needed (wheezing).  10/12/14   Colon Branch, MD  canagliflozin (INVOKANA) 300 MG TABS tablet Take 300 mg by mouth daily before breakfast.    Historical Provider, MD  cetirizine (ZYRTEC) 10 MG tablet Take 1 tablet (10 mg total) by mouth daily. 10/15/14   Midge Minium, MD  cloNIDine (CATAPRES) 0.1 MG tablet Take 1 tablet (0.1 mg total) by mouth 2 (two) times daily. 12/20/14   Midge Minium, MD  dicyclomine (BENTYL) 20 MG tablet Take 1 tablet (20 mg total) by mouth 2 (two) times daily. 12/12/14   Dorie Rank, MD  escitalopram (LEXAPRO) 10 MG tablet Take 1 tablet (10 mg total) by mouth daily. 10/15/14   Midge Minium, MD  glipiZIDE (GLUCOTROL XL) 10 MG 24 hr tablet Take 10 mg by mouth daily with breakfast.    Historical Provider, MD  glucose blood test strip 1 each by Other route as needed. Onetouch ultra test strip     Historical Provider, MD  insulin aspart (NOVOLOG FLEXPEN) 100 UNIT/ML FlexPen Inject 6 Units into the skin See admin instructions. Per sliding scale 01/30/15   Historical Provider, MD  insulin glargine (LANTUS) 100 UNIT/ML injection Inject 32 Units into the skin every evening.    Historical Provider, MD  IRON PO Take 2 tablets by mouth 3 (three) times a week.     Historical Provider, MD  LANTUS SOLOSTAR 100 UNIT/ML Solostar Pen Inject 20 Units into the skin 4 (four) times daily -  before meals and at bedtime. AS DIRECTED 02/27/14   Historical Provider, MD  lisinopril-hydrochlorothiazide (PRINZIDE,ZESTORETIC) 20-12.5 MG per tablet Take 1 tablet by mouth 2 (two) times daily. 11/26/14   Midge Minium, MD  metFORMIN (GLUCOPHAGE) 1000 MG tablet Take 1,000 mg by mouth 2 (two) times daily with a meal.    Historical Provider, MD  methimazole (TAPAZOLE) 5 MG tablet Take 7.5 mg by mouth every evening.  02/27/14   Historical Provider, MD  metoCLOPramide (REGLAN) 10 MG tablet Take 1 tablet (10 mg total) by mouth every 6 (six) hours as needed for nausea (nausea/headache). 12/11/14   Orpah Greek, MD  metoCLOPramide (REGLAN) 5 MG tablet Take 1 tablet (5 mg total) by mouth 3 (three) times daily before meals. 02/09/15   Thurnell Lose, MD  Multiple Vitamin (MULTIVITAMIN WITH MINERALS) TABS tablet Take 1 tablet by mouth every evening.     Historical Provider, MD  Omega-3 Fatty Acids (FISH OIL) 1000 MG CAPS Take 2,000 mg by mouth every evening.     Historical Provider, MD  ondansetron (ZOFRAN ODT) 4 MG disintegrating tablet Take 1 tablet (4 mg total) by mouth every 8 (eight) hours as needed for nausea or vomiting. 02/09/15   Thurnell Lose, MD  ondansetron (ZOFRAN ODT) 4 MG disintegrating  tablet Take 1 tablet (4 mg total) by mouth every 8 (eight) hours as needed for nausea or vomiting. 03/08/15   Shawn C Joy, PA-C  pantoprazole (PROTONIX) 40 MG tablet Take 1 tablet (40 mg total) by mouth daily. Patient taking differently: Take 40 mg by mouth 2 (two) times daily.  06/07/14   Midge Minium, MD  polyethylene glycol [facility] / Floria Raveling) packet Take 17 g by mouth daily. 01/30/15   Historical Provider, MD  potassium chloride (K-DUR) 10 MEQ tablet Take 2 tablets (20 mEq total) by mouth 2 (two) times daily. 02/18/15   Veryl Speak, MD  potassium chloride SA (K-DUR,KLOR-CON) 20  MEQ tablet Take 20 mEq by mouth daily as needed (for leg cramping). Take one tablet by mouth daily. 02/26/12   Midge Minium, MD  promethazine (PHENERGAN) 25 MG tablet Take 1 tablet (25 mg total) by mouth every 6 (six) hours as needed for nausea or vomiting. 02/16/15   Pattricia Boss, MD  rivaroxaban (XARELTO) 20 MG TABS tablet Take 20 mg by mouth daily. 07/02/14   Historical Provider, MD  sucralfate (CARAFATE) 1 G tablet Take 1 tablet (1 g total) by mouth 4 (four) times daily -  with meals and at bedtime. 12/20/14   Midge Minium, MD  Vitamin D, Ergocalciferol, (DRISDOL) 50000 UNITS CAPS capsule Take 50,000 Units by mouth every 7 (seven) days.    Historical Provider, MD   BP 183/102 mmHg  Pulse 113  Temp(Src) 98.7 F (37.1 C) (Oral)  Resp 20  Ht 5\' 1"  (1.549 m)  Wt 150 lb (68.04 kg)  BMI 28.36 kg/m2  SpO2 100% Physical Exam  Constitutional: She is oriented to person, place, and time. She appears well-developed and well-nourished. No distress.  HENT:  Head: Normocephalic and atraumatic.  Right Ear: External ear normal.  Left Ear: External ear normal.  Nose: Nose normal.  Eyes: Right eye exhibits no discharge. Left eye exhibits no discharge.  Cardiovascular: Regular rhythm and normal heart sounds.  Tachycardia present.   Pulmonary/Chest: Effort normal and breath sounds normal.  Abdominal: Soft.  There is tenderness in the epigastric area and left upper quadrant.  Neurological: She is alert and oriented to person, place, and time.  Skin: Skin is warm and dry. She is not diaphoretic.  Nursing note and vitals reviewed.   ED Course  Procedures (including critical care time) Labs Review Labs Reviewed  COMPREHENSIVE METABOLIC PANEL - Abnormal; Notable for the following:    Chloride 99 (*)    Glucose, Bld 268 (*)    BUN 21 (*)    AST 14 (*)    All other components within normal limits  CBC WITH DIFFERENTIAL/PLATELET - Abnormal; Notable for the following:    RBC 3.35 (*)    Hemoglobin 9.6 (*)    HCT 30.0 (*)    Platelets 440 (*)    All other components within normal limits  CBG MONITORING, ED - Abnormal; Notable for the following:    Glucose-Capillary 271 (*)    All other components within normal limits  LIPASE, BLOOD  URINALYSIS, ROUTINE W REFLEX MICROSCOPIC (NOT AT [facility])    Imaging Review Dg Abd Acute W/chest  03/30/2015  CLINICAL DATA:  Vomiting and lower abdominal pain. Constipation for 3 days EXAM: DG ABDOMEN ACUTE W/ 1V CHEST COMPARISON:  Radiograph 03/11/2015 FINDINGS: Insert clear Chest No dilated loops of large or small bowel. Gas and stool in the rectum. No pathologic calcifications. No organomegaly. No acute osseous abnormality IMPRESSION: 1.  No acute cardiopulmonary process. 2. No bowel obstruction or free air. 3. Post cholecystectomy. Electronically Signed   By: Suzy Bouchard M.D.   On: 03/30/2015 11:23   I have personally reviewed and evaluated these images and lab results as part of my medical decision-making.   EKG Interpretation   Date/Time:  Saturday March 30 2015 10:25:50 EST Ventricular Rate:  99 PR Interval:  125 QRS Duration: 88 QT Interval:  358 QTC Calculation: 459 R Axis:   -6 Text Interpretation:  Sinus rhythm Probable left atrial enlargement Left  ventricular hypertrophy Anterior Q waves, possibly due to LVH rate is  faster compared  to  Nov 2016 Confirmed by Regenia Skeeter  MD, [place] [id number]) on  03/30/2015 10:46:46 AM      MDM   Final diagnoses:  Diabetic gastroparesis ([place])  Nausea and vomiting in adult    Patient has recurrent upper abdominal pain and vomiting consistent with her gastroparesis. She states she is normally given Haldol which is confirmed with chart review. She feels much better after this and Reglan. No vomiting since being in the emergency department for several hours. Patient was hydrated with IV fluids. She is hyperglycemic but no acidosis. Stable for discharge home with follow-up with PCP.    Sherwood Gambler, MD 03/30/15 [phone]

## 2015-03-30 NOTE — ED Notes (Signed)
Patient denies n/v/d

## 2015-03-31 ENCOUNTER — Emergency Department (HOSPITAL_BASED_OUTPATIENT_CLINIC_OR_DEPARTMENT_OTHER): Payer: Medicaid Other

## 2015-03-31 LAB — URINALYSIS, ROUTINE W REFLEX MICROSCOPIC
Bilirubin Urine: NEGATIVE
Hgb urine dipstick: NEGATIVE
LEUKOCYTES UA: NEGATIVE
NITRITE: NEGATIVE
PH: 6.5 (ref 5.0–8.0)
PROTEIN: NEGATIVE mg/dL
Specific Gravity, Urine: 1.035 — ABNORMAL HIGH (ref 1.005–1.030)

## 2015-03-31 LAB — BASIC METABOLIC PANEL
ANION GAP: 13 (ref 5–15)
BUN: 21 mg/dL — ABNORMAL HIGH (ref 6–20)
CALCIUM: 9.1 mg/dL (ref 8.9–10.3)
CO2: 25 mmol/L (ref 22–32)
Chloride: 99 mmol/L — ABNORMAL LOW (ref 101–111)
Creatinine, Ser: 0.72 mg/dL (ref 0.44–1.00)
GFR calc non Af Amer: >60 mL/min (ref >60)
GLUCOSE: 240 mg/dL — AB (ref 65–99)
POTASSIUM: 3.1 mmol/L — AB (ref 3.5–5.1)
Sodium: 137 mmol/L (ref 135–145)

## 2015-03-31 LAB — URINE MICROSCOPIC-ADD ON

## 2015-03-31 LAB — CBC WITH DIFFERENTIAL/PLATELET
BASOS ABS: 0 10*3/uL (ref 0.0–0.1)
BASOS PCT: 1 %
Eosinophils Absolute: 0 10*3/uL (ref 0.0–0.7)
Eosinophils Relative: 1 %
HEMATOCRIT: 30.6 % — AB (ref 36.0–46.0)
HEMOGLOBIN: 9.7 g/dL — AB (ref 12.0–15.0)
LYMPHS PCT: 17 %
Lymphs Abs: 1.5 10*3/uL (ref 0.7–4.0)
MCH: 28.4 pg (ref 26.0–34.0)
MCHC: 31.7 g/dL (ref 30.0–36.0)
MCV: 89.7 fL (ref 78.0–100.0)
MONO ABS: 0.7 10*3/uL (ref 0.1–1.0)
Monocytes Relative: 8 %
NEUTROS ABS: 6.5 10*3/uL (ref 1.7–7.7)
NEUTROS PCT: 73 %
Platelets: 447 10*3/uL — ABNORMAL HIGH (ref 150–400)
RBC: 3.41 MIL/uL — AB (ref 3.87–5.11)
RDW: 14.2 % (ref 11.5–15.5)
WBC: 8.8 10*3/uL (ref 4.0–10.5)

## 2015-03-31 LAB — RAPID URINE DRUG SCREEN, HOSP PERFORMED
AMPHETAMINES: NOT DETECTED
Barbiturates: NOT DETECTED
Benzodiazepines: NOT DETECTED
Cocaine: NOT DETECTED
Opiates: NOT DETECTED
TETRAHYDROCANNABINOL: NOT DETECTED

## 2015-03-31 LAB — LIPASE, BLOOD: Lipase: 24 U/L (ref 11–51)

## 2015-03-31 MED ORDER — PROMETHAZINE HCL 25 MG/ML IJ SOLN
12.5000 mg | Freq: Once | INTRAMUSCULAR | Status: AC
  Administered 2015-03-31: 12.5 mg via INTRAVENOUS
  Filled 2015-03-31: qty 1

## 2015-03-31 MED ORDER — IOHEXOL 300 MG/ML  SOLN
100.0000 mL | Freq: Once | INTRAMUSCULAR | Status: AC | PRN
  Administered 2015-03-31: 80 mL via INTRAVENOUS

## 2015-03-31 MED ORDER — METOCLOPRAMIDE HCL 10 MG PO TABS: 10.0000 mg | ORAL_TABLET | Freq: Four times a day (QID) | ORAL | Status: DC

## 2015-03-31 MED ORDER — DICYCLOMINE HCL 20 MG PO TABS: 20.0000 mg | ORAL_TABLET | Freq: Two times a day (BID) | ORAL | Status: DC

## 2015-03-31 MED ORDER — DICYCLOMINE HCL 10 MG/ML IM SOLN
20.0000 mg | Freq: Once | INTRAMUSCULAR | Status: AC
  Administered 2015-03-31: 20 mg via INTRAMUSCULAR
  Filled 2015-03-31: qty 2

## 2015-03-31 MED ORDER — GI COCKTAIL ~~LOC~~
30.0000 mL | Freq: Once | ORAL | Status: AC
  Administered 2015-03-31: 30 mL via ORAL
  Filled 2015-03-31: qty 30

## 2015-03-31 MED ORDER — METOCLOPRAMIDE HCL 5 MG/ML IJ SOLN
10.0000 mg | Freq: Once | INTRAMUSCULAR | Status: AC
  Administered 2015-03-31: 10 mg via INTRAVENOUS
  Filled 2015-03-31: qty 2

## 2015-03-31 MED ORDER — SODIUM CHLORIDE 0.9 % IV BOLUS (SEPSIS)
1000.0000 mL | Freq: Once | INTRAVENOUS | Status: AC
  Administered 2015-03-31: 1000 mL via INTRAVENOUS

## 2015-03-31 MED ORDER — HALOPERIDOL LACTATE 5 MG/ML IJ SOLN
2.0000 mg | Freq: Once | INTRAMUSCULAR | Status: AC
Start: 2015-03-31 — End: 2015-03-31
  Administered 2015-03-31: 2 mg via INTRAVENOUS
  Filled 2015-03-31: qty 1

## 2015-03-31 MED ORDER — FAMOTIDINE IN NACL 20-0.9 MG/50ML-% IV SOLN
20.0000 mg | Freq: Once | INTRAVENOUS | Status: AC
  Administered 2015-03-31: 20 mg via INTRAVENOUS
  Filled 2015-03-31: qty 50

## 2015-03-31 MED ORDER — SODIUM CHLORIDE 0.9 % IV BOLUS (SEPSIS)
500.0000 mL | Freq: Once | INTRAVENOUS | Status: AC
Start: 2015-03-31 — End: 2015-03-31
  Administered 2015-03-31: 500 mL via INTRAVENOUS

## 2015-03-31 MED ORDER — KETOROLAC TROMETHAMINE 30 MG/ML IJ SOLN
30.0000 mg | Freq: Once | INTRAMUSCULAR | Status: AC
  Administered 2015-03-31: 30 mg via INTRAVENOUS
  Filled 2015-03-31: qty 1

## 2015-03-31 MED ORDER — HALOPERIDOL LACTATE 5 MG/ML IJ SOLN
3.0000 mg | Freq: Once | INTRAMUSCULAR | Status: AC
  Administered 2015-03-31: 3 mg via INTRAVENOUS
  Filled 2015-03-31: qty 1

## 2015-03-31 NOTE — ED Notes (Signed)
Assisted pt to car. States she feels better at d/c.

## 2015-03-31 NOTE — ED Notes (Signed)
Patient is sleeping  

## 2015-03-31 NOTE — ED Notes (Addendum)
Sips of diet gingerale given. Tolerated well.

## 2015-03-31 NOTE — ED Provider Notes (Signed)
CSN: [id number]     Arrival date & time 03/30/15  2332 History  By signing my name below, I, Evelene Croon, attest that this documentation has been prepared under the direction and in the presence of Hyacinth Marcelli, MD . Electronically Signed: Evelene Croon, Scribe. 03/31/2015. 12:16 AM.  Chief Complaint  Patient presents with  . Abdominal Pain   Patient is a 49 y.o. female presenting with abdominal pain. The history is provided by the patient. No language interpreter was used.  Abdominal Pain Pain location:  Generalized Pain quality: cramping   Pain radiates to:  Does not radiate Pain severity:  Moderate Duration:  3 days Timing:  Constant Progression:  Unchanged Chronicity:  Chronic Context: retching   Context: not medication withdrawal and not trauma   Relieved by:  Nothing Associated symptoms: constipation, nausea and vomiting   Associated symptoms: no anorexia, no belching, no chest pain, no cough, no diarrhea, no dysuria, no fatigue, no fever, no hematemesis, no hematochezia, no hematuria, no melena, no shortness of breath and no sore throat   Risk factors: not elderly     HPI Comments:  Crystal Peck is a 49 y.o. female with a history of gastroparesis and DM, who presents to the Emergency Department complaining of diffuse abdominal pain with associated nausea and vomiting x  3 days. Pt was seen in this ED on 03/30/15 for the same. Her pain improved and she was discharged home. She reports 2 episodes of vomiting since discharge; notes she has taken 1 dose of Reglan since with little relief. Her last BM was ~ 5 days ago.  Pt was unable to complete gastric emptying study on 03/21/15 at [facility] but is unsure why.   GI -[place]   Past Medical History  Diagnosis Date  . Asthma   . Diabetes mellitus   . Hyperlipidemia   . Hypertension   . GERD (gastroesophageal reflux disease)   . Cerebellar tumor ([place])   . Obesity   . Sleep apnea   . A-fib ([place])   . Thyroid  disease   . Chest pain 03/27/2014    negative lexiscan myoview   Past Surgical History  Procedure Laterality Date  . Cholecystectomy    . Partial hysterectomy      ovaries remain  . Nasal sinus surgery      Dr. Lyn Hollingshead  . Abdominal hysterectomy     Family History  Problem Relation Age of Onset  . Coronary artery disease Father   . Hypertension Father   . Diabetes      maternal grandparents  . Hyperlipidemia Other    Social History  Substance Use Topics  . Smoking status: Never Smoker   . Smokeless tobacco: Never Used  . Alcohol Use: No   OB History    No data available     Review of Systems  Constitutional: Negative for fever and fatigue.  HENT: Negative for sore throat.   Respiratory: Negative for cough and shortness of breath.   Cardiovascular: Negative for chest pain.  Gastrointestinal: Positive for nausea, vomiting, abdominal pain and constipation. Negative for diarrhea, melena, hematochezia, anal bleeding, anorexia and hematemesis.       Cramping  Genitourinary: Negative for dysuria and hematuria.  All other systems reviewed and are negative.  Allergies  Review of patient's allergies indicates no known allergies.  Home Medications   Prior to Admission medications   Medication Sig Start Date End Date Taking? Authorizing Provider  albuterol (PROVENTIL HFA;VENTOLIN  HFA) 108 (90 BASE) MCG/ACT inhaler Inhale 2 puffs into the lungs every 4 (four) hours as needed for wheezing or shortness of breath.    Historical Provider, MD  ALPRAZolam Duanne Moron) 0.5 MG tablet Take 1 tablet (0.5 mg total) by mouth 2 (two) times daily as needed for anxiety. 10/29/14   Midge Minium, MD  amLODipine (NORVASC) 5 MG tablet Take 5 mg by mouth daily.    Historical Provider, MD  atenolol (TENORMIN) 100 MG tablet Take 1 tablet (100 mg total) by mouth every evening. 10/15/14   Midge Minium, MD  atenolol (TENORMIN) 25 MG tablet Take 1 tablet (25 mg total) by mouth daily as needed.  09/13/14   Sueanne Margarita, MD  atorvastatin (LIPITOR) 80 MG tablet Take 1 tablet (80 mg total) by mouth daily. 11/22/14   Sueanne Margarita, MD  azelastine (OPTIVAR) 0.05 % ophthalmic solution Place 1 drop into both eyes 2 (two) times daily. 10/15/14   Midge Minium, MD  beclomethasone (QVAR) 80 MCG/ACT inhaler Inhale 3 puffs into the lungs daily. Patient taking differently: Inhale 3 puffs into the lungs daily as needed (wheezing).  10/12/14   Colon Branch, MD  canagliflozin (INVOKANA) 300 MG TABS tablet Take 300 mg by mouth daily before breakfast.    Historical Provider, MD  cetirizine (ZYRTEC) 10 MG tablet Take 1 tablet (10 mg total) by mouth daily. 10/15/14   Midge Minium, MD  cloNIDine (CATAPRES) 0.1 MG tablet Take 1 tablet (0.1 mg total) by mouth 2 (two) times daily. 12/20/14   Midge Minium, MD  dicyclomine (BENTYL) 20 MG tablet Take 1 tablet (20 mg total) by mouth 2 (two) times daily. 12/12/14   Dorie Rank, MD  escitalopram (LEXAPRO) 10 MG tablet Take 1 tablet (10 mg total) by mouth daily. 10/15/14   Midge Minium, MD  glipiZIDE (GLUCOTROL XL) 10 MG 24 hr tablet Take 10 mg by mouth daily with breakfast.    Historical Provider, MD  glucose blood test strip 1 each by Other route as needed. Onetouch ultra test strip     Historical Provider, MD  insulin aspart (NOVOLOG FLEXPEN) 100 UNIT/ML FlexPen Inject 6 Units into the skin See admin instructions. Per sliding scale 01/30/15   Historical Provider, MD  insulin glargine (LANTUS) 100 UNIT/ML injection Inject 32 Units into the skin every evening.    Historical Provider, MD  IRON PO Take 2 tablets by mouth 3 (three) times a week.     Historical Provider, MD  LANTUS SOLOSTAR 100 UNIT/ML Solostar Pen Inject 20 Units into the skin 4 (four) times daily -  before meals and at bedtime. AS DIRECTED 02/27/14   Historical Provider, MD  lisinopril-hydrochlorothiazide (PRINZIDE,ZESTORETIC) 20-12.5 MG per tablet Take 1 tablet by mouth 2 (two) times daily.  11/26/14   Midge Minium, MD  metFORMIN (GLUCOPHAGE) 1000 MG tablet Take 1,000 mg by mouth 2 (two) times daily with a meal.    Historical Provider, MD  methimazole (TAPAZOLE) 5 MG tablet Take 7.5 mg by mouth every evening.  02/27/14   Historical Provider, MD  metoCLOPramide (REGLAN) 10 MG tablet Take 1 tablet (10 mg total) by mouth every 6 (six) hours as needed for nausea (nausea/headache). 12/11/14   Orpah Greek, MD  metoCLOPramide (REGLAN) 5 MG tablet Take 1 tablet (5 mg total) by mouth 3 (three) times daily before meals. 02/09/15   Thurnell Lose, MD  Multiple Vitamin (MULTIVITAMIN WITH MINERALS) TABS tablet Take 1 tablet  by mouth every evening.     Historical Provider, MD  Omega-3 Fatty Acids (FISH OIL) 1000 MG CAPS Take 2,000 mg by mouth every evening.     Historical Provider, MD  ondansetron (ZOFRAN ODT) 4 MG disintegrating tablet Take 1 tablet (4 mg total) by mouth every 8 (eight) hours as needed for nausea or vomiting. 02/09/15   Thurnell Lose, MD  ondansetron (ZOFRAN ODT) 4 MG disintegrating tablet Take 1 tablet (4 mg total) by mouth every 8 (eight) hours as needed for nausea or vomiting. 03/08/15   Shawn C Joy, PA-C  pantoprazole (PROTONIX) 40 MG tablet Take 1 tablet (40 mg total) by mouth daily. Patient taking differently: Take 40 mg by mouth 2 (two) times daily.  06/07/14   Midge Minium, MD  polyethylene glycol [facility] / Floria Raveling) packet Take 17 g by mouth daily. 01/30/15   Historical Provider, MD  potassium chloride (K-DUR) 10 MEQ tablet Take 2 tablets (20 mEq total) by mouth 2 (two) times daily. 02/18/15   Veryl Speak, MD  potassium chloride SA (K-DUR,KLOR-CON) 20 MEQ tablet Take 20 mEq by mouth daily as needed (for leg cramping). Take one tablet by mouth daily. 02/26/12   Midge Minium, MD  promethazine (PHENERGAN) 25 MG tablet Take 1 tablet (25 mg total) by mouth every 6 (six) hours as needed for nausea or vomiting. 02/16/15   Pattricia Boss, MD  rivaroxaban  (XARELTO) 20 MG TABS tablet Take 20 mg by mouth daily. 07/02/14   Historical Provider, MD  sucralfate (CARAFATE) 1 G tablet Take 1 tablet (1 g total) by mouth 4 (four) times daily -  with meals and at bedtime. 12/20/14   Midge Minium, MD  Vitamin D, Ergocalciferol, (DRISDOL) 50000 UNITS CAPS capsule Take 50,000 Units by mouth every 7 (seven) days.    Historical Provider, MD   BP 184/86 mmHg  Pulse 107  Temp(Src) 98.5 F (36.9 C) (Oral)  Resp 20  Ht 5\' 1"  (1.549 m)  Wt 150 lb (68.04 kg)  BMI 28.36 kg/m2  SpO2 99% Physical Exam  Constitutional: She is oriented to person, place, and time. She appears well-developed and well-nourished. No distress.  HENT:  Head: Normocephalic and atraumatic.  Mouth/Throat: Oropharynx is clear and moist. No oropharyngeal exudate.  Moist mucous membranes   Eyes: Conjunctivae are normal. Pupils are equal, round, and reactive to light.  Neck: Normal range of motion. Neck supple. No JVD present.  Trachea midline  Cardiovascular: Normal rate, regular rhythm and normal heart sounds.   Pulmonary/Chest: Effort normal and breath sounds normal. No respiratory distress.  Abdominal: Soft. Bowel sounds are normal. She exhibits no distension and no mass. There is no tenderness. There is no rebound and no guarding.  Musculoskeletal: Normal range of motion.  Neurological: She is alert and oriented to person, place, and time. She has normal reflexes.  Skin: Skin is warm and dry.  Psychiatric: She has a normal mood and affect. Her behavior is normal.  Nursing note and vitals reviewed.   ED Course  Procedures   DIAGNOSTIC STUDIES:  Oxygen Saturation is 99% on RA, normal by my interpretation.    COORDINATION OF CARE:  12:05 AM Discussed treatment plan with pt at bedside and pt agreed to plan.  Labs Review Labs Reviewed  CBC WITH DIFFERENTIAL/PLATELET  BASIC METABOLIC PANEL  URINALYSIS, ROUTINE W REFLEX MICROSCOPIC (NOT AT [facility])  LIPASE, BLOOD    Imaging  Review Dg Abd Acute W/chest  03/30/2015  CLINICAL DATA:  Vomiting and lower abdominal pain. Constipation for 3 days EXAM: DG ABDOMEN ACUTE W/ 1V CHEST COMPARISON:  Radiograph 03/11/2015 FINDINGS: Insert clear Chest No dilated loops of large or small bowel. Gas and stool in the rectum. No pathologic calcifications. No organomegaly. No acute osseous abnormality IMPRESSION: 1.  No acute cardiopulmonary process. 2. No bowel obstruction or free air. 3. Post cholecystectomy. Electronically Signed   By: Suzy Bouchard M.D.   On: 03/30/2015 11:23   I have personally reviewed and evaluated these images and lab results as part of my medical decision-making.   MDM   Final diagnoses:  None   Results for orders placed or performed during the hospital encounter of 03/30/15  CBC with Differential/Platelet  Result Value Ref Range   WBC 8.8 4.0 - 10.5 K/uL   RBC 3.41 (L) 3.87 - 5.11 MIL/uL   Hemoglobin 9.7 (L) 12.0 - 15.0 g/dL   HCT 30.6 (L) 36.0 - 46.0 %   MCV 89.7 78.0 - 100.0 fL   MCH 28.4 26.0 - 34.0 pg   MCHC 31.7 30.0 - 36.0 g/dL   RDW 14.2 11.5 - 15.5 %   Platelets 447 (H) 150 - 400 K/uL   Neutrophils Relative % 73 %   Neutro Abs 6.5 1.7 - 7.7 K/uL   Lymphocytes Relative 17 %   Lymphs Abs 1.5 0.7 - 4.0 K/uL   Monocytes Relative 8 %   Monocytes Absolute 0.7 0.1 - 1.0 K/uL   Eosinophils Relative 1 %   Eosinophils Absolute 0.0 0.0 - 0.7 K/uL   Basophils Relative 1 %   Basophils Absolute 0.0 0.0 - 0.1 K/uL  Basic metabolic panel  Result Value Ref Range   Sodium 137 135 - 145 mmol/L   Potassium 3.1 (L) 3.5 - 5.1 mmol/L   Chloride 99 (L) 101 - 111 mmol/L   CO2 25 22 - 32 mmol/L   Glucose, Bld 240 (H) 65 - 99 mg/dL   BUN 21 (H) 6 - 20 mg/dL   Creatinine, Ser 0.72 0.44 - 1.00 mg/dL   Calcium 9.1 8.9 - 10.3 mg/dL   GFR calc non Af Amer >60 >60 mL/min   GFR calc Af Amer >60 >60 mL/min   Anion gap 13 5 - 15  Urinalysis, Routine w reflex microscopic (not at [facility])  Result Value Ref Range    Color, Urine YELLOW YELLOW   APPearance CLEAR CLEAR   Specific Gravity, Urine 1.035 (H) 1.005 - 1.030   pH 6.5 5.0 - 8.0   Glucose, UA >1000 (A) NEGATIVE mg/dL   Hgb urine dipstick NEGATIVE NEGATIVE   Bilirubin Urine NEGATIVE NEGATIVE   Ketones, ur >80 (A) NEGATIVE mg/dL   Protein, ur NEGATIVE NEGATIVE mg/dL   Nitrite NEGATIVE NEGATIVE   Leukocytes, UA NEGATIVE NEGATIVE  Lipase, blood  Result Value Ref Range   Lipase 24 11 - 51 U/L  Urine microscopic-add on  Result Value Ref Range   Squamous Epithelial / LPF 0-5 (A) NONE SEEN   WBC, UA 0-5 0 - 5 WBC/hpf   RBC / HPF 0-5 0 - 5 RBC/hpf   Bacteria, UA RARE (A) NONE SEEN   Urine-Other YEAST PRESENT    Dg Chest 2 View  03/08/2015  CLINICAL DATA:  Pt co acid reflux for a while pt would not be specific for how long EXAM: CHEST - 2 VIEW COMPARISON:  01/13/2015 FINDINGS: Lungs are clear. Heart size and mediastinal contours are within normal limits. No effusion. Visualized skeletal structures are unremarkable.  Surgical clips right upper abdomen IMPRESSION: No acute cardiopulmonary disease. Electronically Signed   By: Lucrezia Europe M.D.   On: 03/08/2015 19:03   Ct Abdomen Pelvis W Contrast  03/31/2015  CLINICAL DATA:  Diffuse abdominal pain with nausea and vomiting for 3 days. EXAM: CT ABDOMEN AND PELVIS WITH CONTRAST TECHNIQUE: Multidetector CT imaging of the abdomen and pelvis was performed using the standard protocol following bolus administration of intravenous contrast. CONTRAST:  61mL OMNIPAQUE IOHEXOL 300 MG/ML  SOLN COMPARISON:  Abdominal radiographs yesterday, most recent CT 01/14/2015 FINDINGS: Lower chest: Scattered sub segmental atelectasis in the right lower lobe. The heart is upper limits normal in size. Liver: No focal lesion. Hepatobiliary: Postcholecystectomy with clips in the gallbladder fossa. No biliary dilatation. Pancreas: No ductal dilatation or inflammation.  Mildly atrophic. Spleen: Normal. Adrenal glands: No nodule. Kidneys:  Symmetric renal enhancement. No hydronephrosis. No focal renal abnormality. Stomach/Bowel: Stomach physiologically distended. Bowel malrotation again seen. No obstruction or bowel inflammation. There are no dilated or thickened small bowel loops. Small volume of stool throughout the colon without colonic wall thickening. The appendix is not clearly seen. Vascular/Lymphatic: Small retroperitoneal lymph nodes, unchanged. Abdominal aorta is normal in caliber. Mild atherosclerosis without aneurysm. Reproductive: Uterus surgically absent.  No adnexal mass. Bladder: Physiologically distended, no wall thickening. Other: No free air, free fluid, or intra-abdominal fluid collection. Musculoskeletal: There are no acute or suspicious osseous abnormalities. Chronic sacrococcygeal deformity, unchanged. IMPRESSION: 1. No acute abnormality in the abdomen/pelvis. 2. 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 Electronically Signed   By: Jeb Levering M.D.   On: 03/31/2015 01:52   Dg Abd Acute W/chest  03/30/2015  CLINICAL DATA:  Vomiting and lower abdominal pain. Constipation for 3 days EXAM: DG ABDOMEN ACUTE W/ 1V CHEST COMPARISON:  Radiograph 03/11/2015 FINDINGS: Insert clear Chest No dilated loops of large or small bowel. Gas and stool in the rectum. No pathologic calcifications. No organomegaly. No acute osseous abnormality IMPRESSION: 1.  No acute cardiopulmonary process. 2. No bowel obstruction or free air. 3. Post cholecystectomy. Electronically Signed   By: Suzy Bouchard M.D.   On: 03/30/2015 11:23   Dg Abd Acute W/chest  03/11/2015  CLINICAL DATA:  Abdominal pain and nausea EXAM: DG ABDOMEN ACUTE W/ 1V CHEST COMPARISON:  03/08/2015 FINDINGS: Cardiac shadow is within normal limits. The lungs are well aerated bilaterally. Patchy atelectatic changes are noted in the bases bilaterally. No focal confluent infiltrate is seen. Scattered large and small bowel gas is seen. No free air is  noted. No abnormal mass or abnormal calcifications are seen. The bony structures are within normal limits. IMPRESSION: Patchy bibasilar atelectatic changes. The remainder of the exam is within normal limits. Electronically Signed   By: Inez Catalina M.D.   On: 03/11/2015 07:18    Medications  sodium chloride 0.9 % bolus 1,000 mL (0 mLs Intravenous Stopped 03/31/15 0244)  metoCLOPramide (REGLAN) injection 10 mg (10 mg Intravenous Given 03/31/15 0035)  haloperidol lactate (HALDOL) injection 2 mg (2 mg Intravenous Given 03/31/15 0034)  ketorolac (TORADOL) 30 MG/ML injection 30 mg (30 mg Intravenous Given 03/31/15 0036)  iohexol (OMNIPAQUE) 300 MG/ML solution 100 mL (80 mLs Intravenous Contrast Given 03/31/15 0125)  haloperidol lactate (HALDOL) injection 3 mg (3 mg Intravenous Given 03/31/15 0137)  famotidine (PEPCID) IVPB 20 mg premix (0 mg Intravenous Stopped 03/31/15 0211)  sodium chloride 0.9 % bolus 500 mL (0 mLs Intravenous Stopped 03/31/15 0454)  gi cocktail (Maalox,Lidocaine,Donnatal) (30 mLs Oral Given 03/31/15 0355)  dicyclomine (BENTYL) injection 20 mg (20 mg Intramuscular Given 03/31/15 0355)  promethazine (PHENERGAN) injection 12.5 mg (12.5 mg Intravenous Given 03/31/15 0501)   Patient seen earlier today for same.  Went home and took one dose of reglan but had emesis.  Returns for same with cramping.  Labs normal on recheck,  Given recent visit for same EDP ordered CT abdomen and pelvis.  CT is negative for acute pathology.   Hydrated patient and patient made urine on own.  Given ketones in the urine, continued IVF now patient making copious urine.    Holding down sips of gingerale.   Resting comfortably in the room.  Will increase the patient's reglan to QID.  No further emesis in the ED.  Safe for discharge.  Follow up with your PMD for ongoing care.     I personally performed the services described in this documentation, which was scribed in my presence. The recorded information has  been reviewed and is accurate.      Veatrice Kells, MD 03/31/15 [phone]

## 2015-03-31 NOTE — Discharge Instructions (Signed)
Gastroparesis °Gastroparesis, also called delayed gastric emptying, is a condition in which food takes longer than normal to empty from the stomach. The condition is usually long-lasting (chronic). °CAUSES °This condition may be caused by: °· An endocrine disorder, such as hypothyroidism or diabetes. Diabetes is the most common cause of this condition. °· A nervous system disease, such as Parkinson disease or multiple sclerosis. °· Cancer, infection, or surgery of the stomach or vagus nerve. °· A connective tissue disorder, such as scleroderma. °· Certain medicines. °In most cases, the cause is not known. °RISK FACTORS °This condition is more likely to develop in: °· People with certain disorders, including endocrine disorders, eating disorders, amyloidosis, and scleroderma. °· People with certain diseases, including Parkinson disease or multiple sclerosis. °· People with cancer or infection of the stomach or vagus nerve. °· People who have had surgery on the stomach or vagus nerve. °· People who take certain medicines. °· Women. °SYMPTOMS °Symptoms of this condition include: °· An early feeling of fullness when eating. °· Nausea. °· Weight loss. °· Vomiting. °· Heartburn. °· Abdominal bloating. °· Inconsistent blood glucose levels. °· Lack of appetite. °· Acid from the stomach coming up into the esophagus (gastroesophageal reflux). °· Spasms of the stomach. °Symptoms may come and go. °DIAGNOSIS °This condition is diagnosed with tests, such as: °· Tests that check how long it takes food to move through the stomach and intestines. These tests include: °¨ Upper gastrointestinal (GI) series. In this test, X-rays of the intestines are taken after you drink a liquid. The liquid makes the intestines show up better on the X-rays. °¨ Gastric emptying scintigraphy. In this test, scans are taken after you eat food that contains a small amount of radioactive material. °¨ Wireless capsule GI monitoring system. This test  involves swallowing a capsule that records information about movement through the stomach. °· Gastric manometry. This test measures electrical and muscular activity in the stomach. It is done with a thin tube that is passed down the throat and into the stomach. °· Endoscopy. This test checks for abnormalities in the lining of the stomach. It is done with a long, thin tube that is passed down the throat and into the stomach. °· An ultrasound. This test can help rule out gallbladder disease or pancreatitis as a cause of your symptoms. It uses sound waves to take pictures of the inside of your body. °TREATMENT °There is no cure for gastroparesis. This condition may be managed with: °· Treatment of the underlying condition causing the gastroparesis. °· Lifestyle changes, including exercise and dietary changes. Dietary changes can include: °¨ Changes in what and when you eat. °¨ Eating smaller meals more often. °¨ Eating low-fat foods. °¨ Eating low-fiber forms of high-fiber foods, such as cooked vegetables instead of raw vegetables. °¨ Having liquid foods in place of solid foods. Liquid foods are easier to digest. °· Medicines. These may be given to control nausea and vomiting and to stimulate stomach muscles. °· Getting food through a feeding tube. This may be done in severe cases. °· A gastric neurostimulator. This is a device that is inserted into the body with surgery. It helps improve stomach emptying and control nausea and vomiting. °HOME CARE INSTRUCTIONS °· Follow your health care provider's instructions about exercise and diet. °· Take medicines only as directed by your health care provider. °SEEK MEDICAL CARE IF: °· Your symptoms do not improve with treatment. °· You have new symptoms. °SEEK IMMEDIATE MEDICAL CARE IF: °· You have   severe abdominal pain that does not improve with treatment. °· You have nausea that does not go away. °· You cannot keep fluids down. °  °This information is not intended to replace  advice given to you by your health care provider. Make sure you discuss any questions you have with your health care provider. °  °Document Released: 04/06/2005 Document Revised: 08/21/2014 Document Reviewed: 04/02/2014 °Elsevier Interactive Patient Education ©2016 [employer] ° °

## 2015-03-31 NOTE — ED Notes (Signed)
Pt has called her sister to transport her home. Pt has been d/c, but resting in room until she arrives.

## 2015-03-31 NOTE — ED Notes (Signed)
MD with pt  

## 2015-04-23 MED FILL — SUCRALFATE 1 GM TABLET: 1 | 30 days supply | Qty: 120 | Fill #0 | Status: TO

## 2015-04-24 ENCOUNTER — Encounter (HOSPITAL_BASED_OUTPATIENT_CLINIC_OR_DEPARTMENT_OTHER): Payer: Self-pay | Admitting: Emergency Medicine

## 2015-04-24 ENCOUNTER — Emergency Department (HOSPITAL_BASED_OUTPATIENT_CLINIC_OR_DEPARTMENT_OTHER)
Admission: EM | Admit: 2015-04-24 | Discharge: 2015-04-25 | Disposition: A | Discharge status: Home / Self Care | Payer: Medicaid Other | Attending: Emergency Medicine | Admitting: Emergency Medicine

## 2015-04-24 DIAGNOSIS — R112 Nausea with vomiting, unspecified: Secondary | ICD-10-CM | POA: Diagnosis not present

## 2015-04-24 DIAGNOSIS — E669 Obesity, unspecified: Secondary | ICD-10-CM | POA: Diagnosis not present

## 2015-04-24 DIAGNOSIS — R1010 Upper abdominal pain, unspecified: Secondary | ICD-10-CM | POA: Diagnosis not present

## 2015-04-24 DIAGNOSIS — Z8669 Personal history of other diseases of the nervous system and sense organs: Secondary | ICD-10-CM | POA: Diagnosis not present

## 2015-04-24 DIAGNOSIS — Z7951 Long term (current) use of inhaled steroids: Secondary | ICD-10-CM | POA: Diagnosis not present

## 2015-04-24 DIAGNOSIS — Z86011 Personal history of benign neoplasm of the brain: Secondary | ICD-10-CM | POA: Insufficient documentation

## 2015-04-24 DIAGNOSIS — E785 Hyperlipidemia, unspecified: Secondary | ICD-10-CM | POA: Insufficient documentation

## 2015-04-24 DIAGNOSIS — E119 Type 2 diabetes mellitus without complications: Secondary | ICD-10-CM | POA: Insufficient documentation

## 2015-04-24 DIAGNOSIS — Z7984 Long term (current) use of oral hypoglycemic drugs: Secondary | ICD-10-CM | POA: Insufficient documentation

## 2015-04-24 DIAGNOSIS — Z90710 Acquired absence of both cervix and uterus: Secondary | ICD-10-CM | POA: Diagnosis not present

## 2015-04-24 DIAGNOSIS — K219 Gastro-esophageal reflux disease without esophagitis: Secondary | ICD-10-CM | POA: Insufficient documentation

## 2015-04-24 DIAGNOSIS — Z9049 Acquired absence of other specified parts of digestive tract: Secondary | ICD-10-CM | POA: Insufficient documentation

## 2015-04-24 DIAGNOSIS — I4891 Unspecified atrial fibrillation: Secondary | ICD-10-CM | POA: Diagnosis not present

## 2015-04-24 DIAGNOSIS — Z79899 Other long term (current) drug therapy: Secondary | ICD-10-CM | POA: Diagnosis not present

## 2015-04-24 DIAGNOSIS — E079 Disorder of thyroid, unspecified: Secondary | ICD-10-CM | POA: Diagnosis not present

## 2015-04-24 DIAGNOSIS — J45909 Unspecified asthma, uncomplicated: Secondary | ICD-10-CM | POA: Insufficient documentation

## 2015-04-24 DIAGNOSIS — I10 Essential (primary) hypertension: Secondary | ICD-10-CM | POA: Insufficient documentation

## 2015-04-24 DIAGNOSIS — Z794 Long term (current) use of insulin: Secondary | ICD-10-CM | POA: Insufficient documentation

## 2015-04-24 DIAGNOSIS — Z7901 Long term (current) use of anticoagulants: Secondary | ICD-10-CM | POA: Diagnosis not present

## 2015-04-24 DIAGNOSIS — R109 Unspecified abdominal pain: Secondary | ICD-10-CM

## 2015-04-24 LAB — URINALYSIS, ROUTINE W REFLEX MICROSCOPIC
Bilirubin Urine: NEGATIVE
HGB URINE DIPSTICK: NEGATIVE
LEUKOCYTES UA: NEGATIVE
Nitrite: NEGATIVE
PROTEIN: 30 mg/dL — AB
Specific Gravity, Urine: 1.041 — ABNORMAL HIGH (ref 1.005–1.030)
pH: 6 (ref 5.0–8.0)

## 2015-04-24 LAB — URINE MICROSCOPIC-ADD ON

## 2015-04-24 LAB — CBC WITH DIFFERENTIAL/PLATELET
BASOS ABS: 0 10*3/uL (ref 0.0–0.1)
Basophils Relative: 1 %
EOS PCT: 0 %
Eosinophils Absolute: 0 10*3/uL (ref 0.0–0.7)
HCT: 34.5 % — ABNORMAL LOW (ref 36.0–46.0)
Hemoglobin: 11 g/dL — ABNORMAL LOW (ref 12.0–15.0)
LYMPHS PCT: 15 %
Lymphs Abs: 1.3 10*3/uL (ref 0.7–4.0)
MCH: 28.1 pg (ref 26.0–34.0)
MCHC: 31.9 g/dL (ref 30.0–36.0)
MCV: 88 fL (ref 78.0–100.0)
MONO ABS: 0.3 10*3/uL (ref 0.1–1.0)
Monocytes Relative: 4 %
Neutro Abs: 6.9 10*3/uL (ref 1.7–7.7)
Neutrophils Relative %: 80 %
PLATELETS: 443 10*3/uL — AB (ref 150–400)
RBC: 3.92 MIL/uL (ref 3.87–5.11)
RDW: 13.3 % (ref 11.5–15.5)
WBC: 8.5 10*3/uL (ref 4.0–10.5)

## 2015-04-24 LAB — BASIC METABOLIC PANEL
Anion gap: 17 — ABNORMAL HIGH (ref 5–15)
BUN: 16 mg/dL (ref 6–20)
CO2: 23 mmol/L (ref 22–32)
Calcium: 9.8 mg/dL (ref 8.9–10.3)
Chloride: 100 mmol/L — ABNORMAL LOW (ref 101–111)
Creatinine, Ser: 0.83 mg/dL (ref 0.44–1.00)
GFR calc Af Amer: >60 mL/min (ref >60)
GLUCOSE: 343 mg/dL — AB (ref 65–99)
POTASSIUM: 3.8 mmol/L (ref 3.5–5.1)
Sodium: 140 mmol/L (ref 135–145)

## 2015-04-24 MED ORDER — METOCLOPRAMIDE HCL 5 MG/ML IJ SOLN
10.0000 mg | Freq: Once | INTRAMUSCULAR | Status: AC
  Administered 2015-04-24: 10 mg via INTRAVENOUS
  Filled 2015-04-24: qty 2

## 2015-04-24 MED ORDER — SODIUM CHLORIDE 0.9 % IV BOLUS (SEPSIS)
1000.0000 mL | Freq: Once | INTRAVENOUS | Status: AC
  Administered 2015-04-24: 1000 mL via INTRAVENOUS

## 2015-04-24 MED ORDER — HALOPERIDOL LACTATE 5 MG/ML IJ SOLN
5.0000 mg | Freq: Once | INTRAMUSCULAR | Status: AC
  Administered 2015-04-24: 5 mg via INTRAVENOUS
  Filled 2015-04-24: qty 1

## 2015-04-24 MED ORDER — PROMETHAZINE HCL 25 MG/ML IJ SOLN
25.0000 mg | Freq: Once | INTRAMUSCULAR | Status: AC
  Administered 2015-04-24: 25 mg via INTRAVENOUS
  Filled 2015-04-24: qty 1

## 2015-04-24 NOTE — ED Notes (Signed)
Pt ambulate to bathroom without assistance needed.

## 2015-04-24 NOTE — ED Notes (Signed)
Patient in triage activily vomiting. The patient reports that she has  been vomiting since yesterday.

## 2015-04-24 NOTE — ED Provider Notes (Signed)
CSN: [id number]     Arrival date & time 04/24/15  1609 History   First MD Initiated Contact with Patient 04/24/15 1718     Chief Complaint  Patient presents with  . Emesis      Patient is a 50 y.o. female presenting with vomiting. The history is provided by the patient.  Emesis Associated symptoms: abdominal pain   Associated symptoms: no diarrhea and no headaches    patient presents with nausea vomiting abdominal pain. History same with likely gastroparesis. Multiple visits for same. Has primary gastroenterologist at [facility]. States she's been off of one of her medicines and she thinks this may have made it come back. She has not been here in almost a month, which is along time for her. Been vomiting stomach contents no diarrhea. She's had upper abdominal pain. No fevers.  Past Medical History  Diagnosis Date  . Asthma   . Diabetes mellitus   . Hyperlipidemia   . Hypertension   . GERD (gastroesophageal reflux disease)   . Cerebellar tumor ([place])   . Obesity   . Sleep apnea   . A-fib ([place])   . Thyroid disease   . Chest pain 03/27/2014    negative lexiscan myoview   Past Surgical History  Procedure Laterality Date  . Cholecystectomy    . Partial hysterectomy      ovaries remain  . Nasal sinus surgery      Dr. Lyn Hollingshead  . Abdominal hysterectomy     Family History  Problem Relation Age of Onset  . Coronary artery disease Father   . Hypertension Father   . Diabetes      maternal grandparents  . Hyperlipidemia Other    Social History  Substance Use Topics  . Smoking status: Never Smoker   . Smokeless tobacco: Never Used  . Alcohol Use: No   OB History    No data available     Review of Systems  Constitutional: Negative for activity change and appetite change.  Eyes: Negative for pain.  Respiratory: Negative for chest tightness and shortness of breath.   Cardiovascular: Negative for chest pain and leg swelling.  Gastrointestinal: Positive for nausea,  vomiting and abdominal pain. Negative for diarrhea.  Genitourinary: Negative for flank pain.  Musculoskeletal: Negative for back pain and neck stiffness.  Skin: Negative for rash.  Neurological: Negative for weakness, numbness and headaches.  Psychiatric/Behavioral: Negative for behavioral problems.      Allergies  Review of patient's allergies indicates no known allergies.  Home Medications   Prior to Admission medications   Medication Sig Start Date End Date Taking? Authorizing Provider  albuterol (PROVENTIL HFA;VENTOLIN HFA) 108 (90 BASE) MCG/ACT inhaler Inhale 2 puffs into the lungs every 4 (four) hours as needed for wheezing or shortness of breath.    Historical Provider, MD  ALPRAZolam Duanne Moron) 0.5 MG tablet Take 1 tablet (0.5 mg total) by mouth 2 (two) times daily as needed for anxiety. 10/29/14   Midge Minium, MD  amLODipine (NORVASC) 5 MG tablet Take 5 mg by mouth daily.    Historical Provider, MD  atenolol (TENORMIN) 100 MG tablet Take 1 tablet (100 mg total) by mouth every evening. 10/15/14   Midge Minium, MD  atenolol (TENORMIN) 25 MG tablet Take 1 tablet (25 mg total) by mouth daily as needed. 09/13/14   Sueanne Margarita, MD  atorvastatin (LIPITOR) 80 MG tablet Take 1 tablet (80 mg total) by mouth daily. 11/22/14   Sueanne Margarita,  MD  azelastine (OPTIVAR) 0.05 % ophthalmic solution Place 1 drop into both eyes 2 (two) times daily. 10/15/14   Midge Minium, MD  beclomethasone (QVAR) 80 MCG/ACT inhaler Inhale 3 puffs into the lungs daily. Patient taking differently: Inhale 3 puffs into the lungs daily as needed (wheezing).  10/12/14   Colon Branch, MD  canagliflozin (INVOKANA) 300 MG TABS tablet Take 300 mg by mouth daily before breakfast.    Historical Provider, MD  cetirizine (ZYRTEC) 10 MG tablet Take 1 tablet (10 mg total) by mouth daily. 10/15/14   Midge Minium, MD  cloNIDine (CATAPRES) 0.1 MG tablet Take 1 tablet (0.1 mg total) by mouth 2 (two) times daily.  12/20/14   Midge Minium, MD  dicyclomine (BENTYL) 20 MG tablet Take 1 tablet (20 mg total) by mouth 2 (two) times daily. 12/12/14   Dorie Rank, MD  dicyclomine (BENTYL) 20 MG tablet Take 1 tablet (20 mg total) by mouth 2 (two) times daily. 03/31/15   April Palumbo, MD  escitalopram (LEXAPRO) 10 MG tablet Take 1 tablet (10 mg total) by mouth daily. 10/15/14   Midge Minium, MD  glipiZIDE (GLUCOTROL XL) 10 MG 24 hr tablet Take 10 mg by mouth daily with breakfast.    Historical Provider, MD  glucose blood test strip 1 each by Other route as needed. Onetouch ultra test strip     Historical Provider, MD  insulin aspart (NOVOLOG FLEXPEN) 100 UNIT/ML FlexPen Inject 6 Units into the skin See admin instructions. Per sliding scale 01/30/15   Historical Provider, MD  insulin glargine (LANTUS) 100 UNIT/ML injection Inject 32 Units into the skin every evening.    Historical Provider, MD  IRON PO Take 2 tablets by mouth 3 (three) times a week.     Historical Provider, MD  LANTUS SOLOSTAR 100 UNIT/ML Solostar Pen Inject 20 Units into the skin 4 (four) times daily -  before meals and at bedtime. AS DIRECTED 02/27/14   Historical Provider, MD  lisinopril-hydrochlorothiazide (PRINZIDE,ZESTORETIC) 20-12.5 MG per tablet Take 1 tablet by mouth 2 (two) times daily. 11/26/14   Midge Minium, MD  metFORMIN (GLUCOPHAGE) 1000 MG tablet Take 1,000 mg by mouth 2 (two) times daily with a meal.    Historical Provider, MD  methimazole (TAPAZOLE) 5 MG tablet Take 7.5 mg by mouth every evening.  02/27/14   Historical Provider, MD  metoCLOPramide (REGLAN) 10 MG tablet Take 1 tablet (10 mg total) by mouth every 6 (six) hours as needed for nausea (nausea/headache). 12/11/14   Orpah Greek, MD  metoCLOPramide (REGLAN) 10 MG tablet Take 1 tablet (10 mg total) by mouth every 6 (six) hours. 03/31/15   April Palumbo, MD  metoCLOPramide (REGLAN) 5 MG tablet Take 1 tablet (5 mg total) by mouth 3 (three) times daily before  meals. 02/09/15   Thurnell Lose, MD  Multiple Vitamin (MULTIVITAMIN WITH MINERALS) TABS tablet Take 1 tablet by mouth every evening.     Historical Provider, MD  Omega-3 Fatty Acids (FISH OIL) 1000 MG CAPS Take 2,000 mg by mouth every evening.     Historical Provider, MD  ondansetron (ZOFRAN ODT) 4 MG disintegrating tablet Take 1 tablet (4 mg total) by mouth every 8 (eight) hours as needed for nausea or vomiting. 02/09/15   Thurnell Lose, MD  ondansetron (ZOFRAN ODT) 4 MG disintegrating tablet Take 1 tablet (4 mg total) by mouth every 8 (eight) hours as needed for nausea or vomiting. 03/08/15   Shawn  C Joy, PA-C  pantoprazole (PROTONIX) 40 MG tablet Take 1 tablet (40 mg total) by mouth daily. Patient taking differently: Take 40 mg by mouth 2 (two) times daily.  06/07/14   Midge Minium, MD  polyethylene glycol [facility] / Floria Raveling) packet Take 17 g by mouth daily. 01/30/15   Historical Provider, MD  potassium chloride (K-DUR) 10 MEQ tablet Take 2 tablets (20 mEq total) by mouth 2 (two) times daily. 02/18/15   Veryl Speak, MD  potassium chloride SA (K-DUR,KLOR-CON) 20 MEQ tablet Take 20 mEq by mouth daily as needed (for leg cramping). Take one tablet by mouth daily. 02/26/12   Midge Minium, MD  promethazine (PHENERGAN) 25 MG tablet Take 1 tablet (25 mg total) by mouth every 6 (six) hours as needed for nausea or vomiting. 02/16/15   Pattricia Boss, MD  rivaroxaban (XARELTO) 20 MG TABS tablet Take 20 mg by mouth daily. 07/02/14   Historical Provider, MD  sucralfate (CARAFATE) 1 G tablet Take 1 tablet (1 g total) by mouth 4 (four) times daily -  with meals and at bedtime. 12/20/14   Midge Minium, MD  Vitamin D, Ergocalciferol, (DRISDOL) 50000 UNITS CAPS capsule Take 50,000 Units by mouth every 7 (seven) days.    Historical Provider, MD   BP 181/77 mmHg  Pulse 113  Temp(Src) 98.1 F (36.7 C) (Oral)  Resp 18  SpO2 98% Physical Exam  Constitutional: She appears well-developed.  HENT:   Head: Atraumatic.  Cardiovascular:  Tachycardia  Pulmonary/Chest: Effort normal.  Abdominal:  Upper abdominal tenderness. Patient is sitting up in bed with her arms raised and moving around.  Musculoskeletal: Normal range of motion.  Neurological: She is alert.  Skin: Skin is warm.  Psychiatric: She has a normal mood and affect.    ED Course  Procedures (including critical care time) Labs Review Labs Reviewed  BASIC METABOLIC PANEL - Abnormal; Notable for the following:    Chloride 100 (*)    Glucose, Bld 343 (*)    Anion gap 17 (*)    All other components within normal limits  CBC WITH DIFFERENTIAL/PLATELET - Abnormal; Notable for the following:    Hemoglobin 11.0 (*)    HCT 34.5 (*)    Platelets 443 (*)    All other components within normal limits  URINALYSIS, ROUTINE W REFLEX MICROSCOPIC (NOT AT [facility]) - Abnormal; Notable for the following:    Specific Gravity, Urine 1.041 (*)    Glucose, UA >1000 (*)    Ketones, ur >80 (*)    Protein, ur 30 (*)    All other components within normal limits  URINE MICROSCOPIC-ADD ON - Abnormal; Notable for the following:    Squamous Epithelial / LPF 6-30 (*)    Bacteria, UA MANY (*)    All other components within normal limits    Imaging Review No results found. I have personally reviewed and evaluated these images and lab results as part of my medical decision-making.   EKG Interpretation None      MDM   Final diagnoses:  Non-intractable vomiting with nausea, vomiting of unspecified type  Abdominal pain, unspecified abdominal location    Patient with nausea vomiting. History of same. He appears somewhat dehydrated. Somewhat delayed with IV fluids. Greater than 80 ketones in the urine. Multivitamin fluids on and attempt.    Davonna Belling, MD 04/25/15 [phone]

## 2015-04-24 NOTE — ED Notes (Signed)
Pt was able to keep fluids down, MD notified.

## 2015-04-25 NOTE — Discharge Instructions (Signed)
Nausea and Vomiting  Nausea means you feel sick to your stomach. Throwing up (vomiting) is a reflex where stomach contents come out of your mouth.  HOME CARE   · Take medicine as told by your doctor.  · Do not force yourself to eat. However, you do need to drink fluids.  · If you feel like eating, eat a normal diet as told by your doctor.    Eat rice, wheat, potatoes, bread, lean meats, yogurt, fruits, and vegetables.    Avoid high-fat foods.  · Drink enough fluids to keep your pee (urine) clear or pale yellow.  · Ask your doctor how to replace body fluid losses (rehydrate). Signs of body fluid loss (dehydration) include:    Feeling very thirsty.    Dry lips and mouth.    Feeling dizzy.    Dark pee.    Peeing less than normal.    Feeling confused.    Fast breathing or heart rate.  GET HELP RIGHT AWAY IF:   · You have blood in your throw up.  · You have black or bloody poop (stool).  · You have a bad headache or stiff neck.  · You feel confused.  · You have bad belly (abdominal) pain.  · You have chest pain or trouble breathing.  · You do not pee at least once every 8 hours.  · You have cold, clammy skin.  · You keep throwing up after 24 to 48 hours.  · You have a fever.  MAKE SURE YOU:   · Understand these instructions.  · Will watch your condition.  · Will get help right away if you are not doing well or get worse.     This information is not intended to replace advice given to you by your health care provider. Make sure you discuss any questions you have with your health care provider.     Document Released: 09/23/2007 Document Revised: 06/29/2011 Document Reviewed: 09/05/2010  Elsevier Interactive Patient Education ©2016 [employer]

## 2015-04-26 ENCOUNTER — Emergency Department (HOSPITAL_BASED_OUTPATIENT_CLINIC_OR_DEPARTMENT_OTHER)
Admission: EM | Admit: 2015-04-26 | Discharge: 2015-04-26 | Disposition: A | Discharge status: Home / Self Care | Payer: Medicaid Other | Attending: Emergency Medicine | Admitting: Emergency Medicine

## 2015-04-26 ENCOUNTER — Encounter (HOSPITAL_BASED_OUTPATIENT_CLINIC_OR_DEPARTMENT_OTHER): Payer: Self-pay | Admitting: *Deleted

## 2015-04-26 DIAGNOSIS — E785 Hyperlipidemia, unspecified: Secondary | ICD-10-CM | POA: Diagnosis not present

## 2015-04-26 DIAGNOSIS — Z7951 Long term (current) use of inhaled steroids: Secondary | ICD-10-CM | POA: Insufficient documentation

## 2015-04-26 DIAGNOSIS — I10 Essential (primary) hypertension: Secondary | ICD-10-CM | POA: Insufficient documentation

## 2015-04-26 DIAGNOSIS — Z794 Long term (current) use of insulin: Secondary | ICD-10-CM | POA: Insufficient documentation

## 2015-04-26 DIAGNOSIS — J45909 Unspecified asthma, uncomplicated: Secondary | ICD-10-CM | POA: Insufficient documentation

## 2015-04-26 DIAGNOSIS — G43A0 Cyclical vomiting, not intractable: Secondary | ICD-10-CM | POA: Diagnosis not present

## 2015-04-26 DIAGNOSIS — R109 Unspecified abdominal pain: Secondary | ICD-10-CM | POA: Diagnosis present

## 2015-04-26 DIAGNOSIS — E1165 Type 2 diabetes mellitus with hyperglycemia: Secondary | ICD-10-CM | POA: Diagnosis not present

## 2015-04-26 DIAGNOSIS — E1143 Type 2 diabetes mellitus with diabetic autonomic (poly)neuropathy: Secondary | ICD-10-CM | POA: Insufficient documentation

## 2015-04-26 DIAGNOSIS — I4891 Unspecified atrial fibrillation: Secondary | ICD-10-CM | POA: Diagnosis not present

## 2015-04-26 DIAGNOSIS — R739 Hyperglycemia, unspecified: Secondary | ICD-10-CM

## 2015-04-26 DIAGNOSIS — Z8669 Personal history of other diseases of the nervous system and sense organs: Secondary | ICD-10-CM | POA: Diagnosis not present

## 2015-04-26 DIAGNOSIS — K219 Gastro-esophageal reflux disease without esophagitis: Secondary | ICD-10-CM | POA: Insufficient documentation

## 2015-04-26 DIAGNOSIS — Z3202 Encounter for pregnancy test, result negative: Secondary | ICD-10-CM | POA: Diagnosis not present

## 2015-04-26 DIAGNOSIS — E669 Obesity, unspecified: Secondary | ICD-10-CM | POA: Diagnosis not present

## 2015-04-26 DIAGNOSIS — Z7901 Long term (current) use of anticoagulants: Secondary | ICD-10-CM | POA: Diagnosis not present

## 2015-04-26 DIAGNOSIS — R1110 Vomiting, unspecified: Secondary | ICD-10-CM

## 2015-04-26 DIAGNOSIS — Z79899 Other long term (current) drug therapy: Secondary | ICD-10-CM | POA: Diagnosis not present

## 2015-04-26 DIAGNOSIS — Z85841 Personal history of malignant neoplasm of brain: Secondary | ICD-10-CM | POA: Diagnosis not present

## 2015-04-26 DIAGNOSIS — K3184 Gastroparesis: Secondary | ICD-10-CM

## 2015-04-26 LAB — URINALYSIS, ROUTINE W REFLEX MICROSCOPIC
BILIRUBIN URINE: NEGATIVE
Glucose, UA: >1000 mg/dL — AB
Ketones, ur: >80 mg/dL — AB
Leukocytes, UA: NEGATIVE
NITRITE: NEGATIVE
PH: 5.5 (ref 5.0–8.0)
Protein, ur: NEGATIVE mg/dL
SPECIFIC GRAVITY, URINE: 1.017 (ref 1.005–1.030)

## 2015-04-26 LAB — CBC WITH DIFFERENTIAL/PLATELET
Basophils Absolute: 0.1 10*3/uL (ref 0.0–0.1)
Basophils Relative: 1 %
Eosinophils Absolute: 0.1 10*3/uL (ref 0.0–0.7)
Eosinophils Relative: 0 %
HEMATOCRIT: 34.4 % — AB (ref 36.0–46.0)
HEMOGLOBIN: 11.1 g/dL — AB (ref 12.0–15.0)
LYMPHS ABS: 1.5 10*3/uL (ref 0.7–4.0)
Lymphocytes Relative: 14 %
MCH: 28.5 pg (ref 26.0–34.0)
MCHC: 32.3 g/dL (ref 30.0–36.0)
MCV: 88.4 fL (ref 78.0–100.0)
MONO ABS: 0.7 10*3/uL (ref 0.1–1.0)
MONOS PCT: 6 %
NEUTROS ABS: 8.9 10*3/uL — AB (ref 1.7–7.7)
NEUTROS PCT: 79 %
Platelets: 412 10*3/uL — ABNORMAL HIGH (ref 150–400)
RBC: 3.89 MIL/uL (ref 3.87–5.11)
RDW: 13.7 % (ref 11.5–15.5)
WBC: 11.2 10*3/uL — ABNORMAL HIGH (ref 4.0–10.5)

## 2015-04-26 LAB — COMPREHENSIVE METABOLIC PANEL
ALK PHOS: 110 U/L (ref 38–126)
ALT: 20 U/L (ref 14–54)
ANION GAP: 15 (ref 5–15)
AST: 17 U/L (ref 15–41)
Albumin: 4.1 g/dL (ref 3.5–5.0)
BILIRUBIN TOTAL: 1.9 mg/dL — AB (ref 0.3–1.2)
BUN: 29 mg/dL — ABNORMAL HIGH (ref 6–20)
CALCIUM: 9.4 mg/dL (ref 8.9–10.3)
CO2: 21 mmol/L — AB (ref 22–32)
CREATININE: 0.81 mg/dL (ref 0.44–1.00)
Chloride: 100 mmol/L — ABNORMAL LOW (ref 101–111)
GLUCOSE: 286 mg/dL — AB (ref 65–99)
Potassium: 3.7 mmol/L (ref 3.5–5.1)
Sodium: 136 mmol/L (ref 135–145)
Total Protein: 8.2 g/dL — ABNORMAL HIGH (ref 6.5–8.1)

## 2015-04-26 LAB — CBG MONITORING, ED: Glucose-Capillary: 191 mg/dL — ABNORMAL HIGH (ref 65–99)

## 2015-04-26 LAB — URINE MICROSCOPIC-ADD ON: RBC / HPF: NONE SEEN RBC/hpf (ref 0–5)

## 2015-04-26 LAB — LIPASE, BLOOD: Lipase: 24 U/L (ref 11–51)

## 2015-04-26 LAB — PREGNANCY, URINE: Preg Test, Ur: NEGATIVE

## 2015-04-26 MED ORDER — SODIUM CHLORIDE 0.9 % IV SOLN
1000.0000 mL | INTRAVENOUS | Status: DC
  Administered 2015-04-26: 1000 mL via INTRAVENOUS

## 2015-04-26 MED ORDER — INSULIN ASPART 100 UNIT/ML ~~LOC~~ SOLN
4.0000 [IU] | Freq: Once | SUBCUTANEOUS | Status: AC
  Administered 2015-04-26: 4 [IU] via SUBCUTANEOUS
  Filled 2015-04-26: qty 1

## 2015-04-26 MED ORDER — SODIUM CHLORIDE 0.9 % IV SOLN
1000.0000 mL | Freq: Once | INTRAVENOUS | Status: AC
  Administered 2015-04-26: 1000 mL via INTRAVENOUS

## 2015-04-26 MED ORDER — SODIUM CHLORIDE 0.9 % IV SOLN: 1000.0000 mL | Freq: Once | INTRAVENOUS | Status: DC

## 2015-04-26 MED ORDER — SODIUM CHLORIDE 0.9 % IV SOLN: 1000.0000 mL | INTRAVENOUS | Status: DC

## 2015-04-26 MED ORDER — DIPHENHYDRAMINE HCL 50 MG/ML IJ SOLN
25.0000 mg | Freq: Once | INTRAMUSCULAR | Status: AC
  Administered 2015-04-26: 25 mg via INTRAVENOUS
  Filled 2015-04-26: qty 1

## 2015-04-26 MED ORDER — HYDROMORPHONE HCL 1 MG/ML IJ SOLN: 0.5000 mg | INTRAMUSCULAR | Status: DC | PRN

## 2015-04-26 MED ORDER — GI COCKTAIL ~~LOC~~
30.0000 mL | Freq: Once | ORAL | Status: AC
  Administered 2015-04-26: 30 mL via ORAL
  Filled 2015-04-26: qty 30

## 2015-04-26 MED ORDER — HALOPERIDOL LACTATE 5 MG/ML IJ SOLN
5.0000 mg | Freq: Once | INTRAMUSCULAR | Status: AC
  Administered 2015-04-26: 5 mg via INTRAVENOUS
  Filled 2015-04-26: qty 1

## 2015-04-26 MED ORDER — METOCLOPRAMIDE HCL 5 MG/ML IJ SOLN
10.0000 mg | Freq: Once | INTRAMUSCULAR | Status: AC
  Administered 2015-04-26: 10 mg via INTRAVENOUS
  Filled 2015-04-26: qty 2

## 2015-04-26 MED ORDER — ONDANSETRON HCL 4 MG/2ML IJ SOLN: 4.0000 mg | Freq: Once | INTRAMUSCULAR | Status: DC

## 2015-04-26 NOTE — ED Notes (Signed)
Pt is sleeping, dr. Tomi Bamberger aware, states to hold zofran and administer if needed.

## 2015-04-26 NOTE — ED Notes (Signed)
Pt assisted with dressing, assisted to call her sister to come pick her up. Sister states she will be here in 15 minutes. Pt states she feels weak and requests to remain in room while awaiting her ride. Pt informed that she can wait 15 minutes, but if her sister isn't here, we may have to move her to the waiting area. Pt verbalizes understanding.

## 2015-04-26 NOTE — ED Provider Notes (Signed)
CSN: [id number]     Arrival date & time 04/26/15  [id number] History   First MD Initiated Contact with Patient 04/26/15 0913     Chief Complaint  Patient presents with  . Abdominal Pain    HPI Pt presents to the ED frequently with complaints of abdominal pain and vomiting previously diagnosed as diabetic gastroparesis.  Pt was last seen in the ED 2 days ago. Patient states she has been having trouble with persistent vomiting since that visit. Her symptoms haven't improved in the ED but then returned. She has seen her doctor and she has been told to continue her outpatient medications. She is denying any trouble with any chest pain or abdominal pain. She does have some discomfort in her throat from the vomiting. She denies any trouble with fevers or chills. No diarrhea. No constipation. Past Medical History  Diagnosis Date  . Asthma   . Diabetes mellitus   . Hyperlipidemia   . Hypertension   . GERD (gastroesophageal reflux disease)   . Cerebellar tumor ([place])   . Obesity   . Sleep apnea   . A-fib ([place])   . Thyroid disease   . Chest pain 03/27/2014    negative lexiscan myoview   Past Surgical History  Procedure Laterality Date  . Cholecystectomy    . Partial hysterectomy      ovaries remain  . Nasal sinus surgery      Dr. Lyn Hollingshead  . Abdominal hysterectomy     Family History  Problem Relation Age of Onset  . Coronary artery disease Father   . Hypertension Father   . Diabetes      maternal grandparents  . Hyperlipidemia Other    Social History  Substance Use Topics  . Smoking status: Never Smoker   . Smokeless tobacco: Never Used  . Alcohol Use: No   OB History    No data available     Review of Systems  All other systems reviewed and are negative.     Allergies  Review of patient's allergies indicates no known allergies.  Home Medications   Prior to Admission medications   Medication Sig Start Date End Date Taking? Authorizing Provider  albuterol (PROVENTIL  HFA;VENTOLIN HFA) 108 (90 BASE) MCG/ACT inhaler Inhale 2 puffs into the lungs every 4 (four) hours as needed for wheezing or shortness of breath.    Historical Provider, MD  ALPRAZolam Duanne Moron) 0.5 MG tablet Take 1 tablet (0.5 mg total) by mouth 2 (two) times daily as needed for anxiety. 10/29/14   Midge Minium, MD  amLODipine (NORVASC) 5 MG tablet Take 5 mg by mouth daily.    Historical Provider, MD  atenolol (TENORMIN) 100 MG tablet Take 1 tablet (100 mg total) by mouth every evening. 10/15/14   Midge Minium, MD  atenolol (TENORMIN) 25 MG tablet Take 1 tablet (25 mg total) by mouth daily as needed. 09/13/14   Sueanne Margarita, MD  atorvastatin (LIPITOR) 80 MG tablet Take 1 tablet (80 mg total) by mouth daily. 11/22/14   Sueanne Margarita, MD  azelastine (OPTIVAR) 0.05 % ophthalmic solution Place 1 drop into both eyes 2 (two) times daily. 10/15/14   Midge Minium, MD  beclomethasone (QVAR) 80 MCG/ACT inhaler Inhale 3 puffs into the lungs daily. Patient taking differently: Inhale 3 puffs into the lungs daily as needed (wheezing).  10/12/14   Colon Branch, MD  canagliflozin (INVOKANA) 300 MG TABS tablet Take 300 mg by mouth daily before breakfast.  Historical Provider, MD  cetirizine (ZYRTEC) 10 MG tablet Take 1 tablet (10 mg total) by mouth daily. 10/15/14   Midge Minium, MD  cloNIDine (CATAPRES) 0.1 MG tablet Take 1 tablet (0.1 mg total) by mouth 2 (two) times daily. 12/20/14   Midge Minium, MD  dicyclomine (BENTYL) 20 MG tablet Take 1 tablet (20 mg total) by mouth 2 (two) times daily. 12/12/14   Dorie Rank, MD  dicyclomine (BENTYL) 20 MG tablet Take 1 tablet (20 mg total) by mouth 2 (two) times daily. 03/31/15   April Palumbo, MD  escitalopram (LEXAPRO) 10 MG tablet Take 1 tablet (10 mg total) by mouth daily. 10/15/14   Midge Minium, MD  glipiZIDE (GLUCOTROL XL) 10 MG 24 hr tablet Take 10 mg by mouth daily with breakfast.    Historical Provider, MD  glucose blood test strip 1 each  by Other route as needed. Onetouch ultra test strip     Historical Provider, MD  insulin aspart (NOVOLOG FLEXPEN) 100 UNIT/ML FlexPen Inject 6 Units into the skin See admin instructions. Per sliding scale 01/30/15   Historical Provider, MD  insulin glargine (LANTUS) 100 UNIT/ML injection Inject 32 Units into the skin every evening.    Historical Provider, MD  IRON PO Take 2 tablets by mouth 3 (three) times a week.     Historical Provider, MD  LANTUS SOLOSTAR 100 UNIT/ML Solostar Pen Inject 20 Units into the skin 4 (four) times daily -  before meals and at bedtime. AS DIRECTED 02/27/14   Historical Provider, MD  lisinopril-hydrochlorothiazide (PRINZIDE,ZESTORETIC) 20-12.5 MG per tablet Take 1 tablet by mouth 2 (two) times daily. 11/26/14   Midge Minium, MD  metFORMIN (GLUCOPHAGE) 1000 MG tablet Take 1,000 mg by mouth 2 (two) times daily with a meal.    Historical Provider, MD  methimazole (TAPAZOLE) 5 MG tablet Take 7.5 mg by mouth every evening.  02/27/14   Historical Provider, MD  metoCLOPramide (REGLAN) 10 MG tablet Take 1 tablet (10 mg total) by mouth every 6 (six) hours as needed for nausea (nausea/headache). 12/11/14   Orpah Greek, MD  metoCLOPramide (REGLAN) 10 MG tablet Take 1 tablet (10 mg total) by mouth every 6 (six) hours. 03/31/15   April Palumbo, MD  metoCLOPramide (REGLAN) 5 MG tablet Take 1 tablet (5 mg total) by mouth 3 (three) times daily before meals. 02/09/15   Thurnell Lose, MD  Multiple Vitamin (MULTIVITAMIN WITH MINERALS) TABS tablet Take 1 tablet by mouth every evening.     Historical Provider, MD  Omega-3 Fatty Acids (FISH OIL) 1000 MG CAPS Take 2,000 mg by mouth every evening.     Historical Provider, MD  ondansetron (ZOFRAN ODT) 4 MG disintegrating tablet Take 1 tablet (4 mg total) by mouth every 8 (eight) hours as needed for nausea or vomiting. 02/09/15   Thurnell Lose, MD  ondansetron (ZOFRAN ODT) 4 MG disintegrating tablet Take 1 tablet (4 mg total) by  mouth every 8 (eight) hours as needed for nausea or vomiting. 03/08/15   Shawn C Joy, PA-C  pantoprazole (PROTONIX) 40 MG tablet Take 1 tablet (40 mg total) by mouth daily. Patient taking differently: Take 40 mg by mouth 2 (two) times daily.  06/07/14   Midge Minium, MD  polyethylene glycol [facility] / Floria Raveling) packet Take 17 g by mouth daily. 01/30/15   Historical Provider, MD  potassium chloride (K-DUR) 10 MEQ tablet Take 2 tablets (20 mEq total) by mouth 2 (two) times daily. 02/18/15  Veryl Speak, MD  potassium chloride SA (K-DUR,KLOR-CON) 20 MEQ tablet Take 20 mEq by mouth daily as needed (for leg cramping). Take one tablet by mouth daily. 02/26/12   Midge Minium, MD  promethazine (PHENERGAN) 25 MG tablet Take 1 tablet (25 mg total) by mouth every 6 (six) hours as needed for nausea or vomiting. 02/16/15   Pattricia Boss, MD  rivaroxaban (XARELTO) 20 MG TABS tablet Take 20 mg by mouth daily. 07/02/14   Historical Provider, MD  sucralfate (CARAFATE) 1 G tablet Take 1 tablet (1 g total) by mouth 4 (four) times daily -  with meals and at bedtime. 12/20/14   Midge Minium, MD  Vitamin D, Ergocalciferol, (DRISDOL) 50000 UNITS CAPS capsule Take 50,000 Units by mouth every 7 (seven) days.    Historical Provider, MD   BP 170/91 mmHg  Pulse 93  Temp(Src) 98.5 F (36.9 C) (Oral)  Resp 24  SpO2 100% Physical Exam  Constitutional: She appears well-developed and well-nourished. No distress.  HENT:  Head: Normocephalic and atraumatic.  Right Ear: External ear normal.  Left Ear: External ear normal.  Eyes: Conjunctivae are normal. Right eye exhibits no discharge. Left eye exhibits no discharge. No scleral icterus.  Neck: Neck supple. No tracheal deviation present.  Cardiovascular: Normal rate, regular rhythm and intact distal pulses.   Pulmonary/Chest: Effort normal and breath sounds normal. No stridor. No respiratory distress. She has no wheezes. She has no rales.  Abdominal: Soft. Bowel  sounds are normal. She exhibits no distension. There is no tenderness. There is no rebound and no guarding.  Spitting up mucus in an emesis back  Musculoskeletal: She exhibits no edema or tenderness.  Neurological: She is alert. She has normal strength. No cranial nerve deficit (no facial droop, extraocular movements intact, no slurred speech) or sensory deficit. She exhibits normal muscle tone. She displays no seizure activity. Coordination normal.  Skin: Skin is warm and dry. No rash noted.  Psychiatric: She has a normal mood and affect.  Nursing note and vitals reviewed.   ED Course  Procedures (including critical care time) Labs Review Labs Reviewed  CBC WITH DIFFERENTIAL/PLATELET - Abnormal; Notable for the following:    WBC 11.2 (*)    Hemoglobin 11.1 (*)    HCT 34.4 (*)    Platelets 412 (*)    Neutro Abs 8.9 (*)    All other components within normal limits  COMPREHENSIVE METABOLIC PANEL - Abnormal; Notable for the following:    Chloride 100 (*)    CO2 21 (*)    Glucose, Bld 286 (*)    BUN 29 (*)    Total Protein 8.2 (*)    Total Bilirubin 1.9 (*)    All other components within normal limits  LIPASE, BLOOD  URINALYSIS, ROUTINE W REFLEX MICROSCOPIC (NOT AT [facility])  PREGNANCY, URINE  POC URINE PREG, ED   Medications  0.9 %  sodium chloride infusion (1,000 mLs Intravenous New Bag/Given 04/26/15 0931)    Followed by  0.9 %  sodium chloride infusion (not administered)    Followed by  0.9 %  sodium chloride infusion (not administered)  haloperidol lactate (HALDOL) injection 5 mg (not administered)  metoCLOPramide (REGLAN) injection 10 mg (10 mg Intravenous Given 04/26/15 0935)  diphenhydrAMINE (BENADRYL) injection 25 mg (25 mg Intravenous Given 04/26/15 0935)   1020  Pt still having nausea.  Sx have not resolved with reglan.  Requests haldol which has helped in the past. 1300  Nausea and vomiting seem  better.  Pt requested a GI cocktail. 1359  Pt is feeling better.  She tolerated  oral fluids.  Ready for discharge.    MDM   Final diagnoses:  Diabetic gastroparesis ([place])  Intractable vomiting with nausea, vomiting of unspecified type  Hyperglycemia   The patient's symptoms are consistent with her recurrent issues with nausea and vomiting associated with diabetic gastroparesis. Patient labs show evidence of dehydration with elevated ketones in the urine,, elevated BUN.  I doubt that patient is having symptoms of DKA. She has not had that in the past. Her blood sugar is elevated to 86 and she was given a dose of insulin in the emergency room.  Patient is encouraged to continue with her home medications. Follow-up with her primary doctor.    Dorie Rank, MD 04/26/15 [phone]

## 2015-04-26 NOTE — ED Notes (Signed)
Pt reports her usual gastroparesis symptoms, nausea, vomiting and abd pain x Tuesday. Seen here on Wednesday with phenergan, haldol and reglan given iv, pt states she cont with abd pain and emesis.

## 2015-04-26 NOTE — Discharge Instructions (Signed)

## 2015-05-01 MED FILL — cloNIDine HCL 0.1 MG TABS: 0.1 | 30 days supply | Qty: 60 | Fill #0

## 2015-05-01 MED FILL — PANTOPRAZOLE SOD DR 40 MG T: 40 | 30 days supply | Qty: 30 | Fill #0

## 2015-05-01 MED FILL — methIMAzole 10 MG TABS: 10 | 30 days supply | Qty: 60 | Fill #0

## 2015-05-01 MED FILL — XARELTO 20 MG TABLET: 20 | 30 days supply | Qty: 30 | Fill #0

## 2015-05-01 MED FILL — LANTUS SOLOSTAR 100 UNITS/M: 100 | 19 days supply | Qty: 6 | Fill #0

## 2015-05-01 MED FILL — LISINOPRIL-HCTZ 20-12.5 MG: 20-12.5 | 30 days supply | Qty: 60 | Fill #0

## 2015-05-01 MED FILL — ATENOLOL 100 MG TABLET: 100 | 30 days supply | Qty: 30 | Fill #0

## 2015-05-07 MED FILL — SUCRALFATE 1 GM TABLET: 1 | 30 days supply | Qty: 60 | Fill #0

## 2015-08-19 DIAGNOSIS — A419 Sepsis, unspecified organism: Secondary | ICD-10-CM

## 2015-08-19 DIAGNOSIS — E1110 Type 2 diabetes mellitus with ketoacidosis without coma: Secondary | ICD-10-CM

## 2015-08-19 DIAGNOSIS — R112 Nausea with vomiting, unspecified: Secondary | ICD-10-CM

## 2015-08-19 HISTORY — DX: Nausea with vomiting, unspecified: R11.2

## 2015-08-19 HISTORY — DX: Type 2 diabetes mellitus with ketoacidosis without coma: E11.10

## 2015-08-19 HISTORY — DX: Sepsis, unspecified organism: A41.9

## 2015-08-27 ENCOUNTER — Encounter (HOSPITAL_BASED_OUTPATIENT_CLINIC_OR_DEPARTMENT_OTHER): Payer: Self-pay | Admitting: *Deleted

## 2015-08-27 ENCOUNTER — Emergency Department (HOSPITAL_BASED_OUTPATIENT_CLINIC_OR_DEPARTMENT_OTHER)
Admission: EM | Admit: 2015-08-27 | Discharge: 2015-08-27 | Disposition: A | Discharge status: Home / Self Care | Payer: Medicaid Other | Attending: Emergency Medicine | Admitting: Emergency Medicine

## 2015-08-27 DIAGNOSIS — Z7984 Long term (current) use of oral hypoglycemic drugs: Secondary | ICD-10-CM | POA: Diagnosis not present

## 2015-08-27 DIAGNOSIS — E785 Hyperlipidemia, unspecified: Secondary | ICD-10-CM | POA: Insufficient documentation

## 2015-08-27 DIAGNOSIS — E669 Obesity, unspecified: Secondary | ICD-10-CM | POA: Diagnosis not present

## 2015-08-27 DIAGNOSIS — Z794 Long term (current) use of insulin: Secondary | ICD-10-CM | POA: Diagnosis not present

## 2015-08-27 DIAGNOSIS — Z7951 Long term (current) use of inhaled steroids: Secondary | ICD-10-CM | POA: Insufficient documentation

## 2015-08-27 DIAGNOSIS — E079 Disorder of thyroid, unspecified: Secondary | ICD-10-CM | POA: Insufficient documentation

## 2015-08-27 DIAGNOSIS — I10 Essential (primary) hypertension: Secondary | ICD-10-CM | POA: Insufficient documentation

## 2015-08-27 DIAGNOSIS — R109 Unspecified abdominal pain: Secondary | ICD-10-CM | POA: Insufficient documentation

## 2015-08-27 DIAGNOSIS — J45909 Unspecified asthma, uncomplicated: Secondary | ICD-10-CM | POA: Diagnosis not present

## 2015-08-27 DIAGNOSIS — E119 Type 2 diabetes mellitus without complications: Secondary | ICD-10-CM | POA: Diagnosis not present

## 2015-08-27 DIAGNOSIS — Z79899 Other long term (current) drug therapy: Secondary | ICD-10-CM | POA: Insufficient documentation

## 2015-08-27 DIAGNOSIS — E1143 Type 2 diabetes mellitus with diabetic autonomic (poly)neuropathy: Secondary | ICD-10-CM

## 2015-08-27 DIAGNOSIS — K3184 Gastroparesis: Secondary | ICD-10-CM

## 2015-08-27 LAB — COMPREHENSIVE METABOLIC PANEL
ALK PHOS: 93 U/L (ref 38–126)
ALT: 26 U/L (ref 14–54)
ANION GAP: 11 (ref 5–15)
AST: 27 U/L (ref 15–41)
Albumin: 4.3 g/dL (ref 3.5–5.0)
BILIRUBIN TOTAL: 1.2 mg/dL (ref 0.3–1.2)
BUN: 17 mg/dL (ref 6–20)
CALCIUM: 9.4 mg/dL (ref 8.9–10.3)
CO2: 21 mmol/L — ABNORMAL LOW (ref 22–32)
Chloride: 104 mmol/L (ref 101–111)
Creatinine, Ser: 0.73 mg/dL (ref 0.44–1.00)
GFR calc non Af Amer: >60 mL/min (ref >60)
Glucose, Bld: 295 mg/dL — ABNORMAL HIGH (ref 65–99)
POTASSIUM: 3.6 mmol/L (ref 3.5–5.1)
SODIUM: 136 mmol/L (ref 135–145)
TOTAL PROTEIN: 7.7 g/dL (ref 6.5–8.1)

## 2015-08-27 LAB — CBC WITH DIFFERENTIAL/PLATELET
BASOS PCT: 1 %
Basophils Absolute: 0.1 10*3/uL (ref 0.0–0.1)
EOS ABS: 0 10*3/uL (ref 0.0–0.7)
Eosinophils Relative: 0 %
HEMATOCRIT: 36 % (ref 36.0–46.0)
HEMOGLOBIN: 12.1 g/dL (ref 12.0–15.0)
LYMPHS ABS: 1.8 10*3/uL (ref 0.7–4.0)
Lymphocytes Relative: 25 %
MCH: 28.3 pg (ref 26.0–34.0)
MCHC: 33.6 g/dL (ref 30.0–36.0)
MCV: 84.3 fL (ref 78.0–100.0)
MONO ABS: 0.3 10*3/uL (ref 0.1–1.0)
MONOS PCT: 5 %
NEUTROS PCT: 69 %
Neutro Abs: 5 10*3/uL (ref 1.7–7.7)
Platelets: 323 10*3/uL (ref 150–400)
RBC: 4.27 MIL/uL (ref 3.87–5.11)
RDW: 12.5 % (ref 11.5–15.5)
WBC: 7.1 10*3/uL (ref 4.0–10.5)

## 2015-08-27 LAB — LIPASE, BLOOD: Lipase: 33 U/L (ref 11–51)

## 2015-08-27 MED ORDER — ONDANSETRON HCL 4 MG/2ML IJ SOLN
4.0000 mg | Freq: Once | INTRAMUSCULAR | Status: AC
  Administered 2015-08-27: 4 mg via INTRAVENOUS
  Filled 2015-08-27: qty 2

## 2015-08-27 MED ORDER — KETOROLAC TROMETHAMINE 30 MG/ML IJ SOLN
30.0000 mg | Freq: Once | INTRAMUSCULAR | Status: AC
  Administered 2015-08-27: 30 mg via INTRAVENOUS
  Filled 2015-08-27: qty 1

## 2015-08-27 MED ORDER — ONDANSETRON HCL 4 MG/2ML IJ SOLN
4.0000 mg | Freq: Once | INTRAMUSCULAR | Status: AC | PRN
  Administered 2015-08-27: 4 mg via INTRAVENOUS
  Filled 2015-08-27: qty 2

## 2015-08-27 MED ORDER — SODIUM CHLORIDE 0.9 % IV BOLUS (SEPSIS)
1000.0000 mL | Freq: Once | INTRAVENOUS | Status: AC
  Administered 2015-08-27: 1000 mL via INTRAVENOUS

## 2015-08-27 MED ORDER — METOCLOPRAMIDE HCL 5 MG/ML IJ SOLN
10.0000 mg | Freq: Once | INTRAMUSCULAR | Status: AC
  Administered 2015-08-27: 10 mg via INTRAVENOUS
  Filled 2015-08-27: qty 2

## 2015-08-27 NOTE — ED Notes (Signed)
Cannot hear pt writhing from hallway like before, and daughter has been able to leave room.

## 2015-08-27 NOTE — ED Notes (Signed)
Assumed care from primary nurse, assessment done after physician has seen patient.

## 2015-08-27 NOTE — ED Provider Notes (Signed)
CSN: [id number]     Arrival date & time 08/27/15  1810 History  By signing my name below, I, Arianna Nassar, attest that this documentation has been prepared under the direction and in the presence of Veryl Speak, MD. Electronically Signed: Julien Nordmann, ED Scribe. 08/27/2015. 7:15 PM.    Chief Complaint  Patient presents with  . Abdominal Pain      The history is provided by the patient. No language interpreter was used.   HPI Comments: Crystal Peck is a 50 y.o. female who has a PMHx of DM, HLD, HTN, GERD, cerebellar tumor, gastroparesis and a-fib presents to the Emergency Department complaining of chronic, constant, gradual worsening, moderate, abdominal pain with associated vomiting onset today. Pt was recently diagnosed with diabetic gastroparesis. Pt currently has an ED care plan in place. She denies any other complaints.   Past Medical History  Diagnosis Date  . Asthma   . Diabetes mellitus   . Hyperlipidemia   . Hypertension   . GERD (gastroesophageal reflux disease)   . Cerebellar tumor ([place])   . Obesity   . Sleep apnea   . A-fib ([place])   . Thyroid disease   . Chest pain 03/27/2014    negative lexiscan myoview   Past Surgical History  Procedure Laterality Date  . Cholecystectomy    . Partial hysterectomy      ovaries remain  . Nasal sinus surgery      Dr. Lyn Hollingshead  . Abdominal hysterectomy     Family History  Problem Relation Age of Onset  . Coronary artery disease Father   . Hypertension Father   . Diabetes      maternal grandparents  . Hyperlipidemia Other    Social History  Substance Use Topics  . Smoking status: Never Smoker   . Smokeless tobacco: Never Used  . Alcohol Use: No   OB History    No data available     Review of Systems  A complete 10 system review of systems was obtained and all systems are negative except as noted in the HPI and PMH.    Allergies  Review of patient's allergies indicates no known allergies.  Home Medications    Prior to Admission medications   Medication Sig Start Date End Date Taking? Authorizing Provider  albuterol (PROVENTIL HFA;VENTOLIN HFA) 108 (90 BASE) MCG/ACT inhaler Inhale 2 puffs into the lungs every 4 (four) hours as needed for wheezing or shortness of breath.   Yes Historical Provider, MD  ALPRAZolam Duanne Moron) 0.5 MG tablet Take 1 tablet (0.5 mg total) by mouth 2 (two) times daily as needed for anxiety. 10/29/14  Yes Midge Minium, MD  amLODipine (NORVASC) 5 MG tablet Take 5 mg by mouth daily.   Yes Historical Provider, MD  atenolol (TENORMIN) 100 MG tablet Take 1 tablet (100 mg total) by mouth every evening. 10/15/14  Yes Midge Minium, MD  atenolol (TENORMIN) 25 MG tablet Take 1 tablet (25 mg total) by mouth daily as needed. 09/13/14  Yes Sueanne Margarita, MD  atorvastatin (LIPITOR) 80 MG tablet Take 1 tablet (80 mg total) by mouth daily. 11/22/14  Yes Sueanne Margarita, MD  azelastine (OPTIVAR) 0.05 % ophthalmic solution Place 1 drop into both eyes 2 (two) times daily. 10/15/14  Yes Midge Minium, MD  beclomethasone (QVAR) 80 MCG/ACT inhaler Inhale 3 puffs into the lungs daily. Patient taking differently: Inhale 3 puffs into the lungs daily as needed (wheezing).  10/12/14  Yes [place]  Paz, MD  canagliflozin (INVOKANA) 300 MG TABS tablet Take 300 mg by mouth daily before breakfast.   Yes Historical Provider, MD  cetirizine (ZYRTEC) 10 MG tablet Take 1 tablet (10 mg total) by mouth daily. 10/15/14  Yes Midge Minium, MD  cloNIDine (CATAPRES) 0.1 MG tablet Take 1 tablet (0.1 mg total) by mouth 2 (two) times daily. 12/20/14  Yes Midge Minium, MD  dicyclomine (BENTYL) 20 MG tablet Take 1 tablet (20 mg total) by mouth 2 (two) times daily. 12/12/14  Yes Dorie Rank, MD  dicyclomine (BENTYL) 20 MG tablet Take 1 tablet (20 mg total) by mouth 2 (two) times daily. 03/31/15  Yes April Palumbo, MD  escitalopram (LEXAPRO) 10 MG tablet Take 1 tablet (10 mg total) by mouth daily. 10/15/14  Yes  Midge Minium, MD  glipiZIDE (GLUCOTROL XL) 10 MG 24 hr tablet Take 10 mg by mouth daily with breakfast.   Yes Historical Provider, MD  glucose blood test strip 1 each by Other route as needed. Onetouch ultra test strip    Yes Historical Provider, MD  insulin aspart (NOVOLOG FLEXPEN) 100 UNIT/ML FlexPen Inject 6 Units into the skin See admin instructions. Per sliding scale 01/30/15  Yes Historical Provider, MD  insulin glargine (LANTUS) 100 UNIT/ML injection Inject 32 Units into the skin every evening.   Yes Historical Provider, MD  IRON PO Take 2 tablets by mouth 3 (three) times a week.    Yes Historical Provider, MD  LANTUS SOLOSTAR 100 UNIT/ML Solostar Pen Inject 20 Units into the skin 4 (four) times daily -  before meals and at bedtime. AS DIRECTED 02/27/14  Yes Historical Provider, MD  lisinopril-hydrochlorothiazide (PRINZIDE,ZESTORETIC) 20-12.5 MG per tablet Take 1 tablet by mouth 2 (two) times daily. 11/26/14  Yes Midge Minium, MD  metFORMIN (GLUCOPHAGE) 1000 MG tablet Take 1,000 mg by mouth 2 (two) times daily with a meal.   Yes Historical Provider, MD  methimazole (TAPAZOLE) 5 MG tablet Take 7.5 mg by mouth every evening.  02/27/14  Yes Historical Provider, MD  metoCLOPramide (REGLAN) 10 MG tablet Take 1 tablet (10 mg total) by mouth every 6 (six) hours as needed for nausea (nausea/headache). 12/11/14  Yes Orpah Greek, MD  metoCLOPramide (REGLAN) 10 MG tablet Take 1 tablet (10 mg total) by mouth every 6 (six) hours. 03/31/15  Yes April Palumbo, MD  metoCLOPramide (REGLAN) 5 MG tablet Take 1 tablet (5 mg total) by mouth 3 (three) times daily before meals. 02/09/15  Yes Thurnell Lose, MD  Multiple Vitamin (MULTIVITAMIN WITH MINERALS) TABS tablet Take 1 tablet by mouth every evening.    Yes Historical Provider, MD  Omega-3 Fatty Acids (FISH OIL) 1000 MG CAPS Take 2,000 mg by mouth every evening.    Yes Historical Provider, MD  ondansetron (ZOFRAN ODT) 4 MG disintegrating  tablet Take 1 tablet (4 mg total) by mouth every 8 (eight) hours as needed for nausea or vomiting. 02/09/15  Yes Thurnell Lose, MD  ondansetron (ZOFRAN ODT) 4 MG disintegrating tablet Take 1 tablet (4 mg total) by mouth every 8 (eight) hours as needed for nausea or vomiting. 03/08/15  Yes Shawn C Joy, PA-C  pantoprazole (PROTONIX) 40 MG tablet Take 1 tablet (40 mg total) by mouth daily. Patient taking differently: Take 40 mg by mouth 2 (two) times daily.  06/07/14  Yes Midge Minium, MD  polyethylene glycol (MIRALAX / GLYCOLAX) packet Take 17 g by mouth daily. 01/30/15  Yes Historical Provider, MD  potassium chloride (K-DUR) 10 MEQ tablet Take 2 tablets (20 mEq total) by mouth 2 (two) times daily. 02/18/15  Yes Veryl Speak, MD  potassium chloride SA (K-DUR,KLOR-CON) 20 MEQ tablet Take 20 mEq by mouth daily as needed (for leg cramping). Take one tablet by mouth daily. 02/26/12  Yes Midge Minium, MD  promethazine (PHENERGAN) 25 MG tablet Take 1 tablet (25 mg total) by mouth every 6 (six) hours as needed for nausea or vomiting. 02/16/15  Yes Pattricia Boss, MD  rivaroxaban (XARELTO) 20 MG TABS tablet Take 20 mg by mouth daily. 07/02/14  Yes Historical Provider, MD  sucralfate (CARAFATE) 1 G tablet Take 1 tablet (1 g total) by mouth 4 (four) times daily -  with meals and at bedtime. 12/20/14  Yes Midge Minium, MD  Vitamin D, Ergocalciferol, (DRISDOL) 50000 UNITS CAPS capsule Take 50,000 Units by mouth every 7 (seven) days.   Yes Historical Provider, MD   Triage vitals: BP 122/66 mmHg  Pulse 87  Temp(Src) 98.8 F (37.1 C) (Oral)  Resp 24  Ht 5\' 1"  (1.549 m)  Wt 172 lb (78.019 kg)  BMI 32.52 kg/m2  SpO2 100% Physical Exam  Constitutional: She is oriented to person, place, and time. She appears well-developed and well-nourished. No distress.  Anxious, grunting and hyperventilating  HENT:  Head: Normocephalic and atraumatic.  Eyes: EOM are normal.  Neck: Normal range of motion.   Cardiovascular: Normal rate, regular rhythm and normal heart sounds.   Pulmonary/Chest: Effort normal and breath sounds normal.  Abdominal: Soft. She exhibits no distension. There is no tenderness.  Musculoskeletal: Normal range of motion.  Neurological: She is alert and oriented to person, place, and time.  Skin: Skin is warm and dry.  Psychiatric: She has a normal mood and affect. Judgment normal.  Nursing note and vitals reviewed.   ED Course  Procedures  DIAGNOSTIC STUDIES: Oxygen Saturation is 100% on RA, normal by my interpretation.  COORDINATION OF CARE:  7:14 PM Discussed treatment plan which includes nausea medication and pain medication with pt at bedside and pt agreed to plan. Pt was advised to follow up with her gastroenterologist.  Labs Review Labs Reviewed - No data to display  Imaging Review No results found. I have personally reviewed and evaluated these images and lab results as part of my medical decision-making.   EKG Interpretation None      MDM   Final diagnoses:  None   Patient with history of gastroparesis. She presents here with retching and spitting into a bag. She is extremely anxious and writhing about. She has a history of frequent ED visits for similar complaints and currently has a care plan in place. This care plan was followed. I see nothing emergent and feel as though this is chronic in nature. She was given nonnarcotic medications including Reglan, Zofran, Toradol, and IV fluids. When I reassessed her she was sleeping comfortably in her bed and I believe is now appropriate for discharge. She is informing me that she is out of her pain medication and I have advised her to call her primary Dr. for a refill.  I personally performed the services described in this documentation, which was scribed in my presence. The recorded information has been reviewed and is accurate.     Veryl Speak, MD 08/27/15 2056

## 2015-08-27 NOTE — ED Notes (Signed)
Per Dr. Stark Jock, pt is sleeping in room, not writhing, no vomiting, ready to go home.

## 2015-08-27 NOTE — ED Notes (Signed)
Helped pt get dressed and wheeled her out to the waiting room.  Put pt by lobby phone and dialed daughter's number so she can call for a ride home.  Nurse first aware.  She denies any further needs at this time.

## 2015-08-27 NOTE — Discharge Instructions (Signed)
Gastroparesis °Gastroparesis, also called delayed gastric emptying, is a condition in which food takes longer than normal to empty from the stomach. The condition is usually long-lasting (chronic). °CAUSES °This condition may be caused by: °· An endocrine disorder, such as hypothyroidism or diabetes. Diabetes is the most common cause of this condition. °· A nervous system disease, such as Parkinson disease or multiple sclerosis. °· Cancer, infection, or surgery of the stomach or vagus nerve. °· A connective tissue disorder, such as scleroderma. °· Certain medicines. °In most cases, the cause is not known. °RISK FACTORS °This condition is more likely to develop in: °· People with certain disorders, including endocrine disorders, eating disorders, amyloidosis, and scleroderma. °· People with certain diseases, including Parkinson disease or multiple sclerosis. °· People with cancer or infection of the stomach or vagus nerve. °· People who have had surgery on the stomach or vagus nerve. °· People who take certain medicines. °· Women. °SYMPTOMS °Symptoms of this condition include: °· An early feeling of fullness when eating. °· Nausea. °· Weight loss. °· Vomiting. °· Heartburn. °· Abdominal bloating. °· Inconsistent blood glucose levels. °· Lack of appetite. °· Acid from the stomach coming up into the esophagus (gastroesophageal reflux). °· Spasms of the stomach. °Symptoms may come and go. °DIAGNOSIS °This condition is diagnosed with tests, such as: °· Tests that check how long it takes food to move through the stomach and intestines. These tests include: °¨ Upper gastrointestinal (GI) series. In this test, X-rays of the intestines are taken after you drink a liquid. The liquid makes the intestines show up better on the X-rays. °¨ Gastric emptying scintigraphy. In this test, scans are taken after you eat food that contains a small amount of radioactive material. °¨ Wireless capsule GI monitoring system. This test  involves swallowing a capsule that records information about movement through the stomach. °· Gastric manometry. This test measures electrical and muscular activity in the stomach. It is done with a thin tube that is passed down the throat and into the stomach. °· Endoscopy. This test checks for abnormalities in the lining of the stomach. It is done with a long, thin tube that is passed down the throat and into the stomach. °· An ultrasound. This test can help rule out gallbladder disease or pancreatitis as a cause of your symptoms. It uses sound waves to take pictures of the inside of your body. °TREATMENT °There is no cure for gastroparesis. This condition may be managed with: °· Treatment of the underlying condition causing the gastroparesis. °· Lifestyle changes, including exercise and dietary changes. Dietary changes can include: °¨ Changes in what and when you eat. °¨ Eating smaller meals more often. °¨ Eating low-fat foods. °¨ Eating low-fiber forms of high-fiber foods, such as cooked vegetables instead of raw vegetables. °¨ Having liquid foods in place of solid foods. Liquid foods are easier to digest. °· Medicines. These may be given to control nausea and vomiting and to stimulate stomach muscles. °· Getting food through a feeding tube. This may be done in severe cases. °· A gastric neurostimulator. This is a device that is inserted into the body with surgery. It helps improve stomach emptying and control nausea and vomiting. °HOME CARE INSTRUCTIONS °· Follow your health care provider's instructions about exercise and diet. °· Take medicines only as directed by your health care provider. °SEEK MEDICAL CARE IF: °· Your symptoms do not improve with treatment. °· You have new symptoms. °SEEK IMMEDIATE MEDICAL CARE IF: °· You have   severe abdominal pain that does not improve with treatment. °· You have nausea that does not go away. °· You cannot keep fluids down. °  °This information is not intended to replace  advice given to you by your health care provider. Make sure you discuss any questions you have with your health care provider. °  °Document Released: 04/06/2005 Document Revised: 08/21/2014 Document Reviewed: 04/02/2014 °Elsevier Interactive Patient Education ©2016 [employer] ° °

## 2015-08-27 NOTE — ED Notes (Signed)
C/o n/v and mid abd pain. Dx with gastroporesis in past. Pt is writhing in bed and very anxious.

## 2015-08-27 NOTE — ED Notes (Signed)
Pt ambulated to restroom without issue.

## 2015-08-29 ENCOUNTER — Emergency Department (HOSPITAL_BASED_OUTPATIENT_CLINIC_OR_DEPARTMENT_OTHER): Payer: Medicaid Other

## 2015-08-29 ENCOUNTER — Encounter (HOSPITAL_BASED_OUTPATIENT_CLINIC_OR_DEPARTMENT_OTHER): Payer: Self-pay | Admitting: *Deleted

## 2015-08-29 ENCOUNTER — Emergency Department (HOSPITAL_BASED_OUTPATIENT_CLINIC_OR_DEPARTMENT_OTHER)
Admission: EM | Admit: 2015-08-29 | Discharge: 2015-08-29 | Disposition: A | Discharge status: Home / Self Care | Payer: Medicaid Other | Attending: Emergency Medicine | Admitting: Emergency Medicine

## 2015-08-29 DIAGNOSIS — I10 Essential (primary) hypertension: Secondary | ICD-10-CM | POA: Diagnosis not present

## 2015-08-29 DIAGNOSIS — R1084 Generalized abdominal pain: Secondary | ICD-10-CM | POA: Diagnosis not present

## 2015-08-29 DIAGNOSIS — J45909 Unspecified asthma, uncomplicated: Secondary | ICD-10-CM | POA: Insufficient documentation

## 2015-08-29 DIAGNOSIS — R112 Nausea with vomiting, unspecified: Secondary | ICD-10-CM | POA: Insufficient documentation

## 2015-08-29 DIAGNOSIS — Z794 Long term (current) use of insulin: Secondary | ICD-10-CM | POA: Insufficient documentation

## 2015-08-29 DIAGNOSIS — I4891 Unspecified atrial fibrillation: Secondary | ICD-10-CM | POA: Diagnosis not present

## 2015-08-29 DIAGNOSIS — E669 Obesity, unspecified: Secondary | ICD-10-CM | POA: Diagnosis not present

## 2015-08-29 DIAGNOSIS — R109 Unspecified abdominal pain: Secondary | ICD-10-CM

## 2015-08-29 DIAGNOSIS — Z79899 Other long term (current) drug therapy: Secondary | ICD-10-CM | POA: Diagnosis not present

## 2015-08-29 DIAGNOSIS — G8929 Other chronic pain: Secondary | ICD-10-CM | POA: Insufficient documentation

## 2015-08-29 DIAGNOSIS — E119 Type 2 diabetes mellitus without complications: Secondary | ICD-10-CM | POA: Insufficient documentation

## 2015-08-29 DIAGNOSIS — Z7984 Long term (current) use of oral hypoglycemic drugs: Secondary | ICD-10-CM | POA: Diagnosis not present

## 2015-08-29 HISTORY — DX: Gastroparesis: K31.84

## 2015-08-29 LAB — CBC WITH DIFFERENTIAL/PLATELET
BAND NEUTROPHILS: 0 %
BASOS ABS: 0 10*3/uL (ref 0.0–0.1)
BASOS PCT: 0 %
BLASTS: 0 %
EOS ABS: 0 10*3/uL (ref 0.0–0.7)
Eosinophils Relative: 0 %
HEMATOCRIT: 36.4 % (ref 36.0–46.0)
Hemoglobin: 12.3 g/dL (ref 12.0–15.0)
Lymphocytes Relative: 9 %
Lymphs Abs: 1.3 10*3/uL (ref 0.7–4.0)
MCH: 28.6 pg (ref 26.0–34.0)
MCHC: 33.8 g/dL (ref 30.0–36.0)
MCV: 84.7 fL (ref 78.0–100.0)
METAMYELOCYTES PCT: 0 %
MONO ABS: 0.4 10*3/uL (ref 0.1–1.0)
MONOS PCT: 3 %
Myelocytes: 0 %
NEUTROS ABS: 12.3 10*3/uL — AB (ref 1.7–7.7)
Neutrophils Relative %: 88 %
Other: 0 %
PLATELETS: 317 10*3/uL (ref 150–400)
Promyelocytes Absolute: 0 %
RBC: 4.3 MIL/uL (ref 3.87–5.11)
RDW: 13.2 % (ref 11.5–15.5)
WBC: 14 10*3/uL — ABNORMAL HIGH (ref 4.0–10.5)
nRBC: 0 /100 WBC

## 2015-08-29 LAB — COMPREHENSIVE METABOLIC PANEL
ALBUMIN: 4.5 g/dL (ref 3.5–5.0)
ALT: 26 U/L (ref 14–54)
ANION GAP: 18 — AB (ref 5–15)
AST: 25 U/L (ref 15–41)
Alkaline Phosphatase: 98 U/L (ref 38–126)
BILIRUBIN TOTAL: 2 mg/dL — AB (ref 0.3–1.2)
BUN: 22 mg/dL — AB (ref 6–20)
CALCIUM: 9.2 mg/dL (ref 8.9–10.3)
CHLORIDE: 99 mmol/L — AB (ref 101–111)
CO2: 19 mmol/L — AB (ref 22–32)
Creatinine, Ser: 0.75 mg/dL (ref 0.44–1.00)
GFR calc Af Amer: >60 mL/min (ref >60)
GFR calc non Af Amer: >60 mL/min (ref >60)
GLUCOSE: 272 mg/dL — AB (ref 65–99)
POTASSIUM: 3.6 mmol/L (ref 3.5–5.1)
SODIUM: 136 mmol/L (ref 135–145)
Total Protein: 8.2 g/dL — ABNORMAL HIGH (ref 6.5–8.1)

## 2015-08-29 LAB — URINE MICROSCOPIC-ADD ON: WBC UA: NONE SEEN WBC/hpf (ref 0–5)

## 2015-08-29 LAB — I-STAT VENOUS BLOOD GAS, ED
ACID-BASE DEFICIT: 5 mmol/L — AB (ref 0.0–2.0)
BICARBONATE: 20.1 meq/L (ref 20.0–24.0)
O2 SAT: 48 %
TCO2: 21 mmol/L (ref 0–100)
pCO2, Ven: 34.7 mmHg — ABNORMAL LOW (ref 45.0–50.0)
pH, Ven: 7.371 — ABNORMAL HIGH (ref 7.250–7.300)
pO2, Ven: 26 mmHg — ABNORMAL LOW (ref 31.0–45.0)

## 2015-08-29 LAB — URINALYSIS, ROUTINE W REFLEX MICROSCOPIC
BILIRUBIN URINE: NEGATIVE
Glucose, UA: >1000 mg/dL — AB
Ketones, ur: >80 mg/dL — AB
Leukocytes, UA: NEGATIVE
NITRITE: NEGATIVE
PH: 5.5 (ref 5.0–8.0)
Protein, ur: 100 mg/dL — AB
SPECIFIC GRAVITY, URINE: 1.031 — AB (ref 1.005–1.030)

## 2015-08-29 LAB — LIPASE, BLOOD: Lipase: 23 U/L (ref 11–51)

## 2015-08-29 MED ORDER — ONDANSETRON 4 MG PO TBDP: 4.0000 mg | ORAL_TABLET | Freq: Three times a day (TID) | ORAL | Status: DC | PRN

## 2015-08-29 MED ORDER — SODIUM CHLORIDE 0.9 % IV BOLUS (SEPSIS): 1000.0000 mL | Freq: Once | INTRAVENOUS | Status: DC

## 2015-08-29 MED ORDER — ONDANSETRON HCL 4 MG/2ML IJ SOLN: 4.0000 mg | Freq: Once | INTRAMUSCULAR | Status: DC

## 2015-08-29 MED ORDER — SODIUM CHLORIDE 0.9 % IV BOLUS (SEPSIS)
1000.0000 mL | Freq: Once | INTRAVENOUS | Status: AC
  Administered 2015-08-29: 1000 mL via INTRAVENOUS

## 2015-08-29 MED ORDER — IOPAMIDOL (ISOVUE-300) INJECTION 61%
100.0000 mL | Freq: Once | INTRAVENOUS | Status: AC | PRN
  Administered 2015-08-29: 100 mL via INTRAVENOUS

## 2015-08-29 MED ORDER — METOCLOPRAMIDE HCL 5 MG/ML IJ SOLN
10.0000 mg | Freq: Once | INTRAMUSCULAR | Status: AC
  Administered 2015-08-29: 10 mg via INTRAVENOUS
  Filled 2015-08-29: qty 2

## 2015-08-29 NOTE — ED Notes (Signed)
Patient states she had a four or five day of mid abdominal pain, which is associated with nausea and vomiting.  States she feels constipated, and had a small hard bm four days ago.  Has used stool softeners and mag citrate with no relief.

## 2015-08-29 NOTE — ED Provider Notes (Signed)
CSN: [id number]     Arrival date & time 08/29/15  [id number] History   First MD Initiated Contact with Patient 08/29/15 [phone]     Chief Complaint  Patient presents with  . Abdominal Pain   HPI  Crystal Peck is a 50 year old female with PMHx including DM, GERD presenting with abdominal pain, nausea and vomiting. Pt seen frequently in ED for chronic abdominal pain presumed to be gastroparesis with care plan in place. Most recently seen 2 days ago. Pt reports persistent abdominal pain that she states is her typical chronic pain. The pain is generalized and she is unable to describe the pain. She states it "just hurts". She is also complaining of nausea and multiple episodes of vomiting. She denies have antiemetics at home. She also complains of constipation x 3 days. Last BM was on 3 days ago which was small, hard and required straining. She has tried stool softeners and mag citrate at home without relief. She does have history of abdominal surgeries including partial hysterectomy. Her last visit with her gastroenterologist was in April and she is scheduled to follow up in July. Pt had attempted gastric emptying test at last visit but was unable to complete this due to vomiting. Chart review shows they believe she may have rapid gastric transit vs gastroparesis. Pt denies any other complaints today.   Past Medical History  Diagnosis Date  . Asthma   . Diabetes mellitus   . Hyperlipidemia   . Hypertension   . GERD (gastroesophageal reflux disease)   . Cerebellar tumor ([place])   . Obesity   . Sleep apnea   . A-fib ([place])   . Thyroid disease   . Chest pain 03/27/2014    negative lexiscan myoview  . Gastroparesis    Past Surgical History  Procedure Laterality Date  . Cholecystectomy    . Partial hysterectomy      ovaries remain  . Nasal sinus surgery      Dr. Lyn Hollingshead  . Abdominal hysterectomy     Family History  Problem Relation Age of Onset  . Coronary artery disease Father   . Hypertension  Father   . Diabetes      maternal grandparents  . Hyperlipidemia Other    Social History  Substance Use Topics  . Smoking status: Never Smoker   . Smokeless tobacco: Never Used  . Alcohol Use: No   OB History    No data available     Review of Systems  All other systems reviewed and are negative.     Allergies  Review of patient's allergies indicates no known allergies.  Home Medications   Prior to Admission medications   Medication Sig Start Date End Date Taking? Authorizing Provider  albuterol (PROVENTIL HFA;VENTOLIN HFA) 108 (90 BASE) MCG/ACT inhaler Inhale 2 puffs into the lungs every 4 (four) hours as needed for wheezing or shortness of breath.    Historical Provider, MD  ALPRAZolam Duanne Moron) 0.5 MG tablet Take 1 tablet (0.5 mg total) by mouth 2 (two) times daily as needed for anxiety. 10/29/14   Midge Minium, MD  amLODipine (NORVASC) 5 MG tablet Take 5 mg by mouth daily.    Historical Provider, MD  atenolol (TENORMIN) 100 MG tablet Take 1 tablet (100 mg total) by mouth every evening. 10/15/14   Midge Minium, MD  atenolol (TENORMIN) 25 MG tablet Take 1 tablet (25 mg total) by mouth daily as needed. 09/13/14   Sueanne Margarita, MD  atorvastatin (  LIPITOR) 80 MG tablet Take 1 tablet (80 mg total) by mouth daily. 11/22/14   Sueanne Margarita, MD  azelastine (OPTIVAR) 0.05 % ophthalmic solution Place 1 drop into both eyes 2 (two) times daily. 10/15/14   Midge Minium, MD  beclomethasone (QVAR) 80 MCG/ACT inhaler Inhale 3 puffs into the lungs daily. Patient taking differently: Inhale 3 puffs into the lungs daily as needed (wheezing).  10/12/14   Colon Branch, MD  canagliflozin (INVOKANA) 300 MG TABS tablet Take 300 mg by mouth daily before breakfast.    Historical Provider, MD  cetirizine (ZYRTEC) 10 MG tablet Take 1 tablet (10 mg total) by mouth daily. 10/15/14   Midge Minium, MD  cloNIDine (CATAPRES) 0.1 MG tablet Take 1 tablet (0.1 mg total) by mouth 2 (two) times  daily. 12/20/14   Midge Minium, MD  dicyclomine (BENTYL) 20 MG tablet Take 1 tablet (20 mg total) by mouth 2 (two) times daily. 12/12/14   Dorie Rank, MD  dicyclomine (BENTYL) 20 MG tablet Take 1 tablet (20 mg total) by mouth 2 (two) times daily. 03/31/15   April Palumbo, MD  escitalopram (LEXAPRO) 10 MG tablet Take 1 tablet (10 mg total) by mouth daily. 10/15/14   Midge Minium, MD  glipiZIDE (GLUCOTROL XL) 10 MG 24 hr tablet Take 10 mg by mouth daily with breakfast.    Historical Provider, MD  glucose blood test strip 1 each by Other route as needed. Onetouch ultra test strip     Historical Provider, MD  insulin aspart (NOVOLOG FLEXPEN) 100 UNIT/ML FlexPen Inject 6 Units into the skin See admin instructions. Per sliding scale 01/30/15   Historical Provider, MD  insulin glargine (LANTUS) 100 UNIT/ML injection Inject 32 Units into the skin every evening.    Historical Provider, MD  IRON PO Take 2 tablets by mouth 3 (three) times a week.     Historical Provider, MD  LANTUS SOLOSTAR 100 UNIT/ML Solostar Pen Inject 20 Units into the skin 4 (four) times daily -  before meals and at bedtime. AS DIRECTED 02/27/14   Historical Provider, MD  lisinopril-hydrochlorothiazide (PRINZIDE,ZESTORETIC) 20-12.5 MG per tablet Take 1 tablet by mouth 2 (two) times daily. 11/26/14   Midge Minium, MD  metFORMIN (GLUCOPHAGE) 1000 MG tablet Take 1,000 mg by mouth 2 (two) times daily with a meal.    Historical Provider, MD  methimazole (TAPAZOLE) 5 MG tablet Take 7.5 mg by mouth every evening.  02/27/14   Historical Provider, MD  metoCLOPramide (REGLAN) 10 MG tablet Take 1 tablet (10 mg total) by mouth every 6 (six) hours as needed for nausea (nausea/headache). 12/11/14   Orpah Greek, MD  metoCLOPramide (REGLAN) 10 MG tablet Take 1 tablet (10 mg total) by mouth every 6 (six) hours. 03/31/15   April Palumbo, MD  metoCLOPramide (REGLAN) 5 MG tablet Take 1 tablet (5 mg total) by mouth 3 (three) times daily  before meals. 02/09/15   Thurnell Lose, MD  Multiple Vitamin (MULTIVITAMIN WITH MINERALS) TABS tablet Take 1 tablet by mouth every evening.     Historical Provider, MD  Omega-3 Fatty Acids (FISH OIL) 1000 MG CAPS Take 2,000 mg by mouth every evening.     Historical Provider, MD  ondansetron (ZOFRAN ODT) 4 MG disintegrating tablet Take 1 tablet (4 mg total) by mouth every 8 (eight) hours as needed for nausea or vomiting. 02/09/15   Thurnell Lose, MD  ondansetron (ZOFRAN ODT) 4 MG disintegrating tablet Take 1 tablet (  4 mg total) by mouth every 8 (eight) hours as needed for nausea or vomiting. 03/08/15   Shawn C Joy, PA-C  ondansetron (ZOFRAN ODT) 4 MG disintegrating tablet Take 1 tablet (4 mg total) by mouth every 8 (eight) hours as needed for nausea or vomiting. 08/29/15   Lahoma Crocker Kirstan Fentress, PA-C  pantoprazole (PROTONIX) 40 MG tablet Take 1 tablet (40 mg total) by mouth daily. Patient taking differently: Take 40 mg by mouth 2 (two) times daily.  06/07/14   Midge Minium, MD  polyethylene glycol [facility] / Floria Raveling) packet Take 17 g by mouth daily. 01/30/15   Historical Provider, MD  potassium chloride (K-DUR) 10 MEQ tablet Take 2 tablets (20 mEq total) by mouth 2 (two) times daily. 02/18/15   Veryl Speak, MD  potassium chloride SA (K-DUR,KLOR-CON) 20 MEQ tablet Take 20 mEq by mouth daily as needed (for leg cramping). Take one tablet by mouth daily. 02/26/12   Midge Minium, MD  promethazine (PHENERGAN) 25 MG tablet Take 1 tablet (25 mg total) by mouth every 6 (six) hours as needed for nausea or vomiting. 02/16/15   Pattricia Boss, MD  rivaroxaban (XARELTO) 20 MG TABS tablet Take 20 mg by mouth daily. 07/02/14   Historical Provider, MD  sucralfate (CARAFATE) 1 G tablet Take 1 tablet (1 g total) by mouth 4 (four) times daily -  with meals and at bedtime. 12/20/14   Midge Minium, MD  Vitamin D, Ergocalciferol, (DRISDOL) 50000 UNITS CAPS capsule Take 50,000 Units by mouth every 7 (seven) days.     Historical Provider, MD   BP 132/80 mmHg  Pulse 99  Temp(Src) 98.2 F (36.8 C) (Oral)  Resp 18  Ht 5\' 1"  (1.549 m)  Wt 77.565 kg  BMI 32.33 kg/m2  SpO2 100% Physical Exam  Constitutional: She appears well-developed and well-nourished. She appears distressed.  Uncomfortable and fidgeting on stretcher. Hyperventilating.   HENT:  Head: Normocephalic and atraumatic.  Eyes: Conjunctivae are normal. Right eye exhibits no discharge. Left eye exhibits no discharge. No scleral icterus.  Neck: Normal range of motion.  Cardiovascular: Tachycardia present.   Pulmonary/Chest: Effort normal. No respiratory distress.  Abdominal: Soft. She exhibits no distension. Bowel sounds are decreased. There is no tenderness. There is no rebound and no guarding.  Musculoskeletal: Normal range of motion.  Neurological: She is alert. Coordination normal.  Skin: Skin is warm and dry.  Psychiatric: She has a normal mood and affect. Her behavior is normal.  Nursing note and vitals reviewed.   ED Course  Procedures (including critical care time) Labs Review Labs Reviewed  CBC WITH DIFFERENTIAL/PLATELET - Abnormal; Notable for the following:    WBC 14.0 (*)    Neutro Abs 12.3 (*)    All other components within normal limits  COMPREHENSIVE METABOLIC PANEL - Abnormal; Notable for the following:    Chloride 99 (*)    CO2 19 (*)    Glucose, Bld 272 (*)    BUN 22 (*)    Total Protein 8.2 (*)    Total Bilirubin 2.0 (*)    Anion gap 18 (*)    All other components within normal limits  URINALYSIS, ROUTINE W REFLEX MICROSCOPIC (NOT AT [facility]) - Abnormal; Notable for the following:    Specific Gravity, Urine 1.031 (*)    Glucose, UA >1000 (*)    Hgb urine dipstick MODERATE (*)    Ketones, ur >80 (*)    Protein, ur 100 (*)    All other components within  normal limits  URINE MICROSCOPIC-ADD ON - Abnormal; Notable for the following:    Squamous Epithelial / LPF 0-5 (*)    Bacteria, UA FEW (*)    Casts HYALINE  CASTS (*)    All other components within normal limits  I-STAT VENOUS BLOOD GAS, ED - Abnormal; Notable for the following:    pH, Ven 7.371 (*)    pCO2, Ven 34.7 (*)    pO2, Ven 26.0 (*)    Acid-base deficit 5.0 (*)    All other components within normal limits  LIPASE, BLOOD  BLOOD GAS, VENOUS    Imaging Review Dg Abd 1 View  08/29/2015  CLINICAL DATA:  Mid and lower abdominal pain for 4-5 days. Initial encounter. EXAM: ABDOMEN - 1 VIEW COMPARISON:  CT abdomen and pelvis 03/31/2015. Chest in two views abdomen 03/30/2015. FINDINGS: The bowel gas pattern is normal. No radio-opaque calculi or other significant radiographic abnormality are seen. A few scattered phleboliths are noted IMPRESSION: No acute abnormality. Electronically Signed   By: Inge Rise M.D.   On: 08/29/2015 10:03   Ct Abdomen Pelvis W Contrast  08/29/2015  CLINICAL DATA:  Abdominal pain and leukocytosis. EXAM: CT ABDOMEN AND PELVIS WITH CONTRAST TECHNIQUE: Multidetector CT imaging of the abdomen and pelvis was performed using the standard protocol following bolus administration of intravenous contrast. CONTRAST:  128mL ISOVUE-300 IOPAMIDOL (ISOVUE-300) INJECTION 61% COMPARISON:  03/31/2015 FINDINGS: Lower chest and abdominal wall:  No contributory findings. Hepatobiliary: Hepatic steatosis.Cholecystectomy with normal common bile duct diameter. Pancreas: Unremarkable. Spleen: Unremarkable. Adrenals/Urinary Tract: Negative adrenals. No hydronephrosis or stone. Focal cortical scarring or fissure in the interpolar right kidney. No hydronephrosis or stone . Negative bladder Reproductive:Hysterectomy.  Pelvic floor laxity.  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 The appendix is in the lower abdominal midline and otherwise unremarkable. No obstruction or inflammatory wall thickening Vascular/Lymphatic: Mild scattered atherosclerotic calcification. No  acute vascular finding No mass or adenopathy. Peritoneal: No ascites or pneumoperitoneum. Musculoskeletal: Spondylosis.  Lower lumbar facet arthropathy. IMPRESSION: 1. No acute finding. 2. Hepatic steatosis. 3. Congenital non rotation of bowel. Electronically Signed   By: Monte Fantasia M.D.   On: 08/29/2015 11:33   I have personally reviewed and evaluated these images and lab results as part of my medical decision-making.   EKG Interpretation None     1:00 - IV lost. Pt did receive first dose of reglan and half a bolus. She reports improvement in nausea and has had no vomiting. Pt sleeping comfortably upon entering room. Will PO challenge and use IM medications if needed.   2:00 - Pt drinking ginger ale and has had no further episodes of vomiting. Reports improvement in symptoms and ready for discharge home.   MDM   Final diagnoses:  Chronic abdominal pain  Non-intractable vomiting with nausea, vomiting of unspecified type   50 year old female with history of frequent emergency department visits related to chronic abdominal pain, nausea and vomiting. Patient presenting with acute worsening of chronic symptoms. Initially tachycardic to 121 which improved to high 90s after fluids and symptom control. Patient appears uncomfortable but is nontoxic. She is hyperventilating and appears anxious. Abdomen is soft, nontender without peritoneal signs. Leukocytosis to 14. Glucose 272 with an elevated anion gap to 18. Ketonuria. pH 3.37. I have reviewed her care plan. Chart review without prior instances of leukocytosis and abdominal pain. Given this change in lab work, we'll obtain CT abdomen.  CT abdomen negative. Chart review shows elevated anion gap at previous visits. Blood work does not suggest DKA. Likely secondary to dehydration. Given bolus and antiemetics. When attempting to get a second bolus, IV was lost. She reports feeling much improved and will PO challenge. After one hour, patient is drinking  ginger ale and has had no episodes of vomiting while monitored in the emergency department. She is requesting discharge home and appears clinically improved. Encouraged patient to follow-up with her gastroenterologist.  At this time there does not appear to be any evidence of an acute emergency medical condition and the patient appears stable for discharge with appropriate outpatient follow up. Diagnosis was discussed with patient who verbalizes understanding and is agreeable to discharge. Pt case discussed with Dr. Johnney Killian who agrees with my plan. Return precautions given in discharge paperwork and discussed with pt at bedside. Pt is stable for discharge.     Josephina Gip, PA-C 08/29/15 1531  Charlesetta Shanks, MD 08/29/15 [phone]

## 2015-08-29 NOTE — ED Notes (Signed)
Spoke with PA - unable to get another IV - oral trial given.

## 2015-08-29 NOTE — Discharge Instructions (Signed)
Nausea and Vomiting Nausea is a sick feeling that often comes before throwing up (vomiting). Vomiting is a reflex where stomach contents come out of your mouth. Vomiting can cause severe loss of body fluids (dehydration). Children and elderly adults can become dehydrated quickly, especially if they also have diarrhea. Nausea and vomiting are symptoms of a condition or disease. It is important to find the cause of your symptoms. CAUSES   Direct irritation of the stomach lining. This irritation can result from increased acid production (gastroesophageal reflux disease), infection, food poisoning, taking certain medicines (such as nonsteroidal anti-inflammatory drugs), alcohol use, or tobacco use.  Signals from the brain.These signals could be caused by a headache, heat exposure, an inner ear disturbance, increased pressure in the brain from injury, infection, a tumor, or a concussion, pain, emotional stimulus, or metabolic problems.  An obstruction in the gastrointestinal tract (bowel obstruction).  Illnesses such as diabetes, hepatitis, gallbladder problems, appendicitis, kidney problems, cancer, sepsis, atypical symptoms of a heart attack, or eating disorders.  Medical treatments such as chemotherapy and radiation.  Receiving medicine that makes you sleep (general anesthetic) during surgery. DIAGNOSIS Your caregiver may ask for tests to be done if the problems do not improve after a few days. Tests may also be done if symptoms are severe or if the reason for the nausea and vomiting is not clear. Tests may include:  Urine tests.  Blood tests.  Stool tests.  Cultures (to look for evidence of infection).  X-rays or other imaging studies. Test results can help your caregiver make decisions about treatment or the need for additional tests. TREATMENT You need to stay well hydrated. Drink frequently but in small amounts.You may wish to drink water, sports drinks, clear broth, or eat frozen  ice pops or gelatin dessert to help stay hydrated.When you eat, eating slowly may help prevent nausea.There are also some antinausea medicines that may help prevent nausea. HOME CARE INSTRUCTIONS   Take all medicine as directed by your caregiver.  If you do not have an appetite, do not force yourself to eat. However, you must continue to drink fluids.  If you have an appetite, eat a normal diet unless your caregiver tells you differently.  Eat a variety of complex carbohydrates (rice, wheat, potatoes, bread), lean meats, yogurt, fruits, and vegetables.  Avoid high-fat foods because they are more difficult to digest.  Drink enough water and fluids to keep your urine clear or pale yellow.  If you are dehydrated, ask your caregiver for specific rehydration instructions. Signs of dehydration may include:  Severe thirst.  Dry lips and mouth.  Dizziness.  Dark urine.  Decreasing urine frequency and amount.  Confusion.  Rapid breathing or pulse. SEEK IMMEDIATE MEDICAL CARE IF:   You have blood or brown flecks (like coffee grounds) in your vomit.  You have black or bloody stools.  You have a severe headache or stiff neck.  You are confused.  You have severe abdominal pain.  You have chest pain or trouble breathing.  You do not urinate at least once every 8 hours.  You develop cold or clammy skin.  You continue to vomit for longer than 24 to 48 hours.  You have a fever. MAKE SURE YOU:   Understand these instructions.  Will watch your condition.  Will get help right away if you are not doing well or get worse.   This information is not intended to replace advice given to you by your health care provider. Make sure  you discuss any questions you have with your health care provider. °  °Document Released: 04/06/2005 Document Revised: 06/29/2011 Document Reviewed: 09/03/2010 °Elsevier Interactive Patient Education ©2016 [employer] ° °Abdominal Pain, Adult °Many  things can cause abdominal pain. Usually, abdominal pain is not caused by a disease and will improve without treatment. It can often be observed and treated at home. Your health care provider will do a physical exam and possibly order blood tests and X-rays to help determine the seriousness of your pain. However, in many cases, more time must pass before a clear cause of the pain can be found. Before that point, your health care provider may not know if you need more testing or further treatment. °HOME CARE INSTRUCTIONS °Monitor your abdominal pain for any changes. The following actions may help to alleviate any discomfort you are experiencing: °· Only take over-the-counter or prescription medicines as directed by your health care provider. °· Do not take laxatives unless directed to do so by your health care provider. °· Try a clear liquid diet (broth, tea, or water) as directed by your health care provider. Slowly move to a bland diet as tolerated. °SEEK MEDICAL CARE IF: °· You have unexplained abdominal pain. °· You have abdominal pain associated with nausea or diarrhea. °· You have pain when you urinate or have a bowel movement. °· You experience abdominal pain that wakes you in the night. °· You have abdominal pain that is worsened or improved by eating food. °· You have abdominal pain that is worsened with eating fatty foods. °· You have a fever. °SEEK IMMEDIATE MEDICAL CARE IF: °· Your pain does not go away within 2 hours. °· You keep throwing up (vomiting). °· Your pain is felt only in portions of the abdomen, such as the right side or the left lower portion of the abdomen. °· You pass bloody or black tarry stools. °MAKE SURE YOU: °· Understand these instructions. °· Will watch your condition. °· Will get help right away if you are not doing well or get worse. °  °This information is not intended to replace advice given to you by your health care provider. Make sure you discuss any questions you have with  your health care provider. °  °Document Released: 01/14/2005 Document Revised: 12/26/2014 Document Reviewed: 12/14/2012 °Elsevier Interactive Patient Education ©2016 [employer] ° °

## 2015-08-30 ENCOUNTER — Emergency Department (HOSPITAL_BASED_OUTPATIENT_CLINIC_OR_DEPARTMENT_OTHER)
Admission: EM | Admit: 2015-08-30 | Discharge: 2015-08-30 | Disposition: A | Discharge status: Home / Self Care | Payer: Medicaid Other | Attending: Emergency Medicine | Admitting: Emergency Medicine

## 2015-08-30 ENCOUNTER — Encounter (HOSPITAL_BASED_OUTPATIENT_CLINIC_OR_DEPARTMENT_OTHER): Payer: Self-pay | Admitting: Emergency Medicine

## 2015-08-30 DIAGNOSIS — I10 Essential (primary) hypertension: Secondary | ICD-10-CM | POA: Insufficient documentation

## 2015-08-30 DIAGNOSIS — E119 Type 2 diabetes mellitus without complications: Secondary | ICD-10-CM | POA: Insufficient documentation

## 2015-08-30 DIAGNOSIS — R112 Nausea with vomiting, unspecified: Secondary | ICD-10-CM | POA: Diagnosis present

## 2015-08-30 DIAGNOSIS — K3184 Gastroparesis: Secondary | ICD-10-CM | POA: Insufficient documentation

## 2015-08-30 DIAGNOSIS — E079 Disorder of thyroid, unspecified: Secondary | ICD-10-CM | POA: Diagnosis not present

## 2015-08-30 DIAGNOSIS — J45909 Unspecified asthma, uncomplicated: Secondary | ICD-10-CM | POA: Diagnosis not present

## 2015-08-30 DIAGNOSIS — Z7984 Long term (current) use of oral hypoglycemic drugs: Secondary | ICD-10-CM | POA: Diagnosis not present

## 2015-08-30 DIAGNOSIS — Z794 Long term (current) use of insulin: Secondary | ICD-10-CM | POA: Insufficient documentation

## 2015-08-30 DIAGNOSIS — R109 Unspecified abdominal pain: Secondary | ICD-10-CM | POA: Diagnosis not present

## 2015-08-30 DIAGNOSIS — E669 Obesity, unspecified: Secondary | ICD-10-CM | POA: Diagnosis not present

## 2015-08-30 DIAGNOSIS — E785 Hyperlipidemia, unspecified: Secondary | ICD-10-CM | POA: Diagnosis not present

## 2015-08-30 LAB — CBG MONITORING, ED
GLUCOSE-CAPILLARY: 333 mg/dL — AB (ref 65–99)
Glucose-Capillary: 221 mg/dL — ABNORMAL HIGH (ref 65–99)
Glucose-Capillary: 199 mg/dL — ABNORMAL HIGH (ref 65–99)

## 2015-08-30 MED ORDER — HALOPERIDOL LACTATE 5 MG/ML IJ SOLN
5.0000 mg | Freq: Once | INTRAMUSCULAR | Status: AC
  Administered 2015-08-30: 5 mg via INTRAMUSCULAR
  Filled 2015-08-30: qty 1

## 2015-08-30 MED ORDER — METOCLOPRAMIDE HCL 5 MG/ML IJ SOLN
10.0000 mg | Freq: Once | INTRAMUSCULAR | Status: AC
  Administered 2015-08-30: 10 mg via INTRAVENOUS
  Filled 2015-08-30: qty 2

## 2015-08-30 MED ORDER — SODIUM CHLORIDE 0.9 % IV BOLUS (SEPSIS)
1000.0000 mL | Freq: Once | INTRAVENOUS | Status: AC
  Administered 2015-08-30: 1000 mL via INTRAVENOUS

## 2015-08-30 MED ORDER — PANTOPRAZOLE SODIUM 40 MG IV SOLR
40.0000 mg | Freq: Once | INTRAVENOUS | Status: AC
  Administered 2015-08-30: 40 mg via INTRAVENOUS
  Filled 2015-08-30: qty 40

## 2015-08-30 MED ORDER — KETOROLAC TROMETHAMINE 30 MG/ML IJ SOLN
30.0000 mg | Freq: Once | INTRAMUSCULAR | Status: AC
  Administered 2015-08-30: 30 mg via INTRAVENOUS
  Filled 2015-08-30: qty 1

## 2015-08-30 NOTE — ED Notes (Addendum)
Patient reports 5 episodes vomiting which began 4 hours ago.  Reports epigastric abdominal pain.  Patient seen here yesterday for same.  Per son patient ate fried chicken last night which upsets her stomach.

## 2015-08-30 NOTE — ED Notes (Signed)
Patient given gingerale for PO challenge.

## 2015-08-30 NOTE — ED Notes (Signed)
When entering room to start IV patient laying in bed with only underwear on.  Gown laying next to patient.  Patient states "I can't wear that right now, I'm too hot".  Patient given sheet to cover up with but refuses to use this.  Curtain drawn.

## 2015-08-30 NOTE — ED Provider Notes (Signed)
CSN: [id number]     Arrival date & time 08/30/15  0804 History   First MD Initiated Contact with Patient 08/30/15 [phone]     Chief Complaint  Patient presents with  . Emesis     (Consider location/radiation/quality/duration/timing/severity/associated sxs/prior Treatment) HPI Comments: Patient is a 50 year old female who has a history of diabetes, hypertension and hyperlipidemia. She also has atrial fibrillation and chronic episodes of abdominal pain with associated vomiting. She has a ED care plan in place.  She has been seen in the emergency department twice this week as well as one time at [facility]. She states that she started vomiting last night again. It's nonbloody and nonbilious. She was seen in the emergency department yesterday for similar complaints. She reports generalized tenderness to her abdomen. She denies any blood in her emesis or stool. She's having normal bowel movements although she says they're small. She states she's had the same type of symptoms she's had in the past. She took her ODT Zofran without improvement of symptoms. She states she ate fried chicken last night which likely upset her stomach.  Patient is a 50 y.o. female presenting with vomiting.  Emesis Associated symptoms: abdominal pain   Associated symptoms: no arthralgias, no chills, no diarrhea and no headaches     Past Medical History  Diagnosis Date  . Asthma   . Diabetes mellitus   . Hyperlipidemia   . Hypertension   . GERD (gastroesophageal reflux disease)   . Cerebellar tumor ([place])   . Obesity   . Sleep apnea   . A-fib ([place])   . Thyroid disease   . Chest pain 03/27/2014    negative lexiscan myoview  . Gastroparesis    Past Surgical History  Procedure Laterality Date  . Cholecystectomy    . Partial hysterectomy      ovaries remain  . Nasal sinus surgery      Dr. Lyn Hollingshead  . Abdominal hysterectomy     Family History  Problem Relation Age of Onset  . Coronary artery disease  Father   . Hypertension Father   . Diabetes      maternal grandparents  . Hyperlipidemia Other    Social History  Substance Use Topics  . Smoking status: Never Smoker   . Smokeless tobacco: Never Used  . Alcohol Use: No   OB History    No data available     Review of Systems  Constitutional: Negative for fever, chills, diaphoresis and fatigue.  HENT: Negative for congestion, rhinorrhea and sneezing.   Eyes: Negative.   Respiratory: Negative for cough, chest tightness and shortness of breath.   Cardiovascular: Negative for chest pain and leg swelling.  Gastrointestinal: Positive for nausea, vomiting and abdominal pain. Negative for diarrhea and blood in stool.  Genitourinary: Negative for frequency, hematuria, flank pain and difficulty urinating.  Musculoskeletal: Negative for back pain and arthralgias.  Skin: Negative for rash.  Neurological: Negative for dizziness, speech difficulty, weakness, numbness and headaches.      Allergies  Review of patient's allergies indicates no known allergies.  Home Medications   Prior to Admission medications   Medication Sig Start Date End Date Taking? Authorizing Provider  albuterol (PROVENTIL HFA;VENTOLIN HFA) 108 (90 BASE) MCG/ACT inhaler Inhale 2 puffs into the lungs every 4 (four) hours as needed for wheezing or shortness of breath.    Historical Provider, MD  ALPRAZolam Duanne Moron) 0.5 MG tablet Take 1 tablet (0.5 mg total) by mouth 2 (two) times daily as needed  for anxiety. 10/29/14   Midge Minium, MD  amLODipine (NORVASC) 5 MG tablet Take 5 mg by mouth daily.    Historical Provider, MD  atenolol (TENORMIN) 100 MG tablet Take 1 tablet (100 mg total) by mouth every evening. 10/15/14   Midge Minium, MD  atenolol (TENORMIN) 25 MG tablet Take 1 tablet (25 mg total) by mouth daily as needed. 09/13/14   Sueanne Margarita, MD  atorvastatin (LIPITOR) 80 MG tablet Take 1 tablet (80 mg total) by mouth daily. 11/22/14   Sueanne Margarita, MD   azelastine (OPTIVAR) 0.05 % ophthalmic solution Place 1 drop into both eyes 2 (two) times daily. 10/15/14   Midge Minium, MD  beclomethasone (QVAR) 80 MCG/ACT inhaler Inhale 3 puffs into the lungs daily. Patient taking differently: Inhale 3 puffs into the lungs daily as needed (wheezing).  10/12/14   Colon Branch, MD  canagliflozin (INVOKANA) 300 MG TABS tablet Take 300 mg by mouth daily before breakfast.    Historical Provider, MD  cetirizine (ZYRTEC) 10 MG tablet Take 1 tablet (10 mg total) by mouth daily. 10/15/14   Midge Minium, MD  cloNIDine (CATAPRES) 0.1 MG tablet Take 1 tablet (0.1 mg total) by mouth 2 (two) times daily. 12/20/14   Midge Minium, MD  dicyclomine (BENTYL) 20 MG tablet Take 1 tablet (20 mg total) by mouth 2 (two) times daily. 12/12/14   Dorie Rank, MD  dicyclomine (BENTYL) 20 MG tablet Take 1 tablet (20 mg total) by mouth 2 (two) times daily. 03/31/15   April Palumbo, MD  escitalopram (LEXAPRO) 10 MG tablet Take 1 tablet (10 mg total) by mouth daily. 10/15/14   Midge Minium, MD  glipiZIDE (GLUCOTROL XL) 10 MG 24 hr tablet Take 10 mg by mouth daily with breakfast.    Historical Provider, MD  glucose blood test strip 1 each by Other route as needed. Onetouch ultra test strip     Historical Provider, MD  insulin aspart (NOVOLOG FLEXPEN) 100 UNIT/ML FlexPen Inject 6 Units into the skin See admin instructions. Per sliding scale 01/30/15   Historical Provider, MD  insulin glargine (LANTUS) 100 UNIT/ML injection Inject 32 Units into the skin every evening.    Historical Provider, MD  IRON PO Take 2 tablets by mouth 3 (three) times a week.     Historical Provider, MD  LANTUS SOLOSTAR 100 UNIT/ML Solostar Pen Inject 20 Units into the skin 4 (four) times daily -  before meals and at bedtime. AS DIRECTED 02/27/14   Historical Provider, MD  lisinopril-hydrochlorothiazide (PRINZIDE,ZESTORETIC) 20-12.5 MG per tablet Take 1 tablet by mouth 2 (two) times daily. 11/26/14   Midge Minium, MD  metFORMIN (GLUCOPHAGE) 1000 MG tablet Take 1,000 mg by mouth 2 (two) times daily with a meal.    Historical Provider, MD  methimazole (TAPAZOLE) 5 MG tablet Take 7.5 mg by mouth every evening.  02/27/14   Historical Provider, MD  metoCLOPramide (REGLAN) 10 MG tablet Take 1 tablet (10 mg total) by mouth every 6 (six) hours as needed for nausea (nausea/headache). 12/11/14   Orpah Greek, MD  metoCLOPramide (REGLAN) 10 MG tablet Take 1 tablet (10 mg total) by mouth every 6 (six) hours. 03/31/15   April Palumbo, MD  metoCLOPramide (REGLAN) 5 MG tablet Take 1 tablet (5 mg total) by mouth 3 (three) times daily before meals. 02/09/15   Thurnell Lose, MD  Multiple Vitamin (MULTIVITAMIN WITH MINERALS) TABS tablet Take 1 tablet by mouth  every evening.     Historical Provider, MD  Omega-3 Fatty Acids (FISH OIL) 1000 MG CAPS Take 2,000 mg by mouth every evening.     Historical Provider, MD  ondansetron (ZOFRAN ODT) 4 MG disintegrating tablet Take 1 tablet (4 mg total) by mouth every 8 (eight) hours as needed for nausea or vomiting. 02/09/15   Thurnell Lose, MD  ondansetron (ZOFRAN ODT) 4 MG disintegrating tablet Take 1 tablet (4 mg total) by mouth every 8 (eight) hours as needed for nausea or vomiting. 03/08/15   Shawn C Joy, PA-C  ondansetron (ZOFRAN ODT) 4 MG disintegrating tablet Take 1 tablet (4 mg total) by mouth every 8 (eight) hours as needed for nausea or vomiting. 08/29/15   Lahoma Crocker Barrett, PA-C  pantoprazole (PROTONIX) 40 MG tablet Take 1 tablet (40 mg total) by mouth daily. Patient taking differently: Take 40 mg by mouth 2 (two) times daily.  06/07/14   Midge Minium, MD  polyethylene glycol [facility] / Floria Raveling) packet Take 17 g by mouth daily. 01/30/15   Historical Provider, MD  potassium chloride (K-DUR) 10 MEQ tablet Take 2 tablets (20 mEq total) by mouth 2 (two) times daily. 02/18/15   Veryl Speak, MD  potassium chloride SA (K-DUR,KLOR-CON) 20 MEQ tablet Take 20 mEq  by mouth daily as needed (for leg cramping). Take one tablet by mouth daily. 02/26/12   Midge Minium, MD  promethazine (PHENERGAN) 25 MG tablet Take 1 tablet (25 mg total) by mouth every 6 (six) hours as needed for nausea or vomiting. 02/16/15   Pattricia Boss, MD  rivaroxaban (XARELTO) 20 MG TABS tablet Take 20 mg by mouth daily. 07/02/14   Historical Provider, MD  sucralfate (CARAFATE) 1 G tablet Take 1 tablet (1 g total) by mouth 4 (four) times daily -  with meals and at bedtime. 12/20/14   Midge Minium, MD  Vitamin D, Ergocalciferol, (DRISDOL) 50000 UNITS CAPS capsule Take 50,000 Units by mouth every 7 (seven) days.    Historical Provider, MD   BP 175/81 mmHg  Pulse 107  Temp(Src) 98.6 F (37 C) (Oral)  Resp 18  Ht 5\' 1"  (1.549 m)  Wt 161 lb (73.029 kg)  BMI 30.44 kg/m2  SpO2 100% Physical Exam  Constitutional: She is oriented to person, place, and time. She appears well-developed and well-nourished.  HENT:  Head: Normocephalic and atraumatic.  Eyes: Pupils are equal, round, and reactive to light.  Neck: Normal range of motion. Neck supple.  Cardiovascular: Normal rate, regular rhythm and normal heart sounds.   Pulmonary/Chest: Effort normal and breath sounds normal. No respiratory distress. She has no wheezes. She has no rales. She exhibits no tenderness.  Abdominal: Soft. Bowel sounds are normal. There is tenderness (mild diffuse tenderness). There is no rebound and no guarding.  Musculoskeletal: Normal range of motion. She exhibits no edema.  Lymphadenopathy:    She has no cervical adenopathy.  Neurological: She is alert and oriented to person, place, and time.  Skin: Skin is warm and dry. No rash noted.  Psychiatric: She has a normal mood and affect.    ED Course  Procedures (including critical care time) Labs Review Labs Reviewed  CBG MONITORING, ED - Abnormal; Notable for the following:    Glucose-Capillary 333 (*)    All other components within normal limits   CBG MONITORING, ED - Abnormal; Notable for the following:    Glucose-Capillary 221 (*)    All other components within normal limits  CBG MONITORING,  ED - Abnormal; Notable for the following:    Glucose-Capillary 199 (*)    All other components within normal limits    Imaging Review Dg Abd 1 View  08/29/2015  CLINICAL DATA:  Mid and lower abdominal pain for 4-5 days. Initial encounter. EXAM: ABDOMEN - 1 VIEW COMPARISON:  CT abdomen and pelvis 03/31/2015. Chest in two views abdomen 03/30/2015. FINDINGS: The bowel gas pattern is normal. No radio-opaque calculi or other significant radiographic abnormality are seen. A few scattered phleboliths are noted IMPRESSION: No acute abnormality. Electronically Signed   By: Inge Rise M.D.   On: 08/29/2015 10:03   Ct Abdomen Pelvis W Contrast  08/29/2015  CLINICAL DATA:  Abdominal pain and leukocytosis. EXAM: CT ABDOMEN AND PELVIS WITH CONTRAST TECHNIQUE: Multidetector CT imaging of the abdomen and pelvis was performed using the standard protocol following bolus administration of intravenous contrast. CONTRAST:  143mL ISOVUE-300 IOPAMIDOL (ISOVUE-300) INJECTION 61% COMPARISON:  03/31/2015 FINDINGS: Lower chest and abdominal wall:  No contributory findings. Hepatobiliary: Hepatic steatosis.Cholecystectomy with normal common bile duct diameter. Pancreas: Unremarkable. Spleen: Unremarkable. Adrenals/Urinary Tract: Negative adrenals. No hydronephrosis or stone. Focal cortical scarring or fissure in the interpolar right kidney. No hydronephrosis or stone . Negative bladder Reproductive:Hysterectomy.  Pelvic floor laxity.  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 The appendix is in the lower abdominal midline and otherwise unremarkable. No obstruction or inflammatory wall thickening Vascular/Lymphatic: Mild scattered atherosclerotic calcification. No acute vascular finding No mass  or adenopathy. Peritoneal: No ascites or pneumoperitoneum. Musculoskeletal: Spondylosis.  Lower lumbar facet arthropathy. IMPRESSION: 1. No acute finding. 2. Hepatic steatosis. 3. Congenital non rotation of bowel. Electronically Signed   By: Monte Fantasia M.D.   On: 08/29/2015 11:33   I have personally reviewed and evaluated these images and lab results as part of my medical decision-making.   EKG Interpretation None      Medications  sodium chloride 0.9 % bolus 1,000 mL (0 mLs Intravenous Stopped 08/30/15 1020)  metoCLOPramide (REGLAN) injection 10 mg (10 mg Intravenous Given 08/30/15 0848)  pantoprazole (PROTONIX) injection 40 mg (40 mg Intravenous Given 08/30/15 0848)  ketorolac (TORADOL) 30 MG/ML injection 30 mg (30 mg Intravenous Given 08/30/15 1021)  sodium chloride 0.9 % bolus 1,000 mL (0 mLs Intravenous Stopped 08/30/15 1144)  haloperidol lactate (HALDOL) injection 5 mg (5 mg Intramuscular Given 08/30/15 1140)    MDM   Final diagnoses:  Gastroparesis   Patient presents with abdominal pain associated vomiting. She's had similar complaints in the past. She states this is the same type of episode she's had before. She's followed by gastroenterology with [facility]. Recent gastric emptying study which showed possible rapid gastric emptying. She's scheduled to have an endoscopy. She doesn't have any abdominal pain on exam. She's afebrile. She initially was tachycardic but her heart rate has improved to around 100. He has no ongoing vomiting. She's tolerating by mouth fluids. Her blood sugar was 333 on arrival but has improved to near 200 with IV fluids. I don't feel that she needs imaging studies she actually had a CT scan of her abdomen done yesterday which didn't show any acute disease. She was discharged home in good condition. She was encouraged to contact for her gastroenterologist for ongoing management.    Malvin Johns, MD 08/30/15 1444

## 2015-08-30 NOTE — Discharge Instructions (Signed)
Gastroparesis °Gastroparesis, also called delayed gastric emptying, is a condition in which food takes longer than normal to empty from the stomach. The condition is usually long-lasting (chronic). °CAUSES °This condition may be caused by: °· An endocrine disorder, such as hypothyroidism or diabetes. Diabetes is the most common cause of this condition. °· A nervous system disease, such as Parkinson disease or multiple sclerosis. °· Cancer, infection, or surgery of the stomach or vagus nerve. °· A connective tissue disorder, such as scleroderma. °· Certain medicines. °In most cases, the cause is not known. °RISK FACTORS °This condition is more likely to develop in: °· People with certain disorders, including endocrine disorders, eating disorders, amyloidosis, and scleroderma. °· People with certain diseases, including Parkinson disease or multiple sclerosis. °· People with cancer or infection of the stomach or vagus nerve. °· People who have had surgery on the stomach or vagus nerve. °· People who take certain medicines. °· Women. °SYMPTOMS °Symptoms of this condition include: °· An early feeling of fullness when eating. °· Nausea. °· Weight loss. °· Vomiting. °· Heartburn. °· Abdominal bloating. °· Inconsistent blood glucose levels. °· Lack of appetite. °· Acid from the stomach coming up into the esophagus (gastroesophageal reflux). °· Spasms of the stomach. °Symptoms may come and go. °DIAGNOSIS °This condition is diagnosed with tests, such as: °· Tests that check how long it takes food to move through the stomach and intestines. These tests include: °¨ Upper gastrointestinal (GI) series. In this test, X-rays of the intestines are taken after you drink a liquid. The liquid makes the intestines show up better on the X-rays. °¨ Gastric emptying scintigraphy. In this test, scans are taken after you eat food that contains a small amount of radioactive material. °¨ Wireless capsule GI monitoring system. This test  involves swallowing a capsule that records information about movement through the stomach. °· Gastric manometry. This test measures electrical and muscular activity in the stomach. It is done with a thin tube that is passed down the throat and into the stomach. °· Endoscopy. This test checks for abnormalities in the lining of the stomach. It is done with a long, thin tube that is passed down the throat and into the stomach. °· An ultrasound. This test can help rule out gallbladder disease or pancreatitis as a cause of your symptoms. It uses sound waves to take pictures of the inside of your body. °TREATMENT °There is no cure for gastroparesis. This condition may be managed with: °· Treatment of the underlying condition causing the gastroparesis. °· Lifestyle changes, including exercise and dietary changes. Dietary changes can include: °¨ Changes in what and when you eat. °¨ Eating smaller meals more often. °¨ Eating low-fat foods. °¨ Eating low-fiber forms of high-fiber foods, such as cooked vegetables instead of raw vegetables. °¨ Having liquid foods in place of solid foods. Liquid foods are easier to digest. °· Medicines. These may be given to control nausea and vomiting and to stimulate stomach muscles. °· Getting food through a feeding tube. This may be done in severe cases. °· A gastric neurostimulator. This is a device that is inserted into the body with surgery. It helps improve stomach emptying and control nausea and vomiting. °HOME CARE INSTRUCTIONS °· Follow your health care provider's instructions about exercise and diet. °· Take medicines only as directed by your health care provider. °SEEK MEDICAL CARE IF: °· Your symptoms do not improve with treatment. °· You have new symptoms. °SEEK IMMEDIATE MEDICAL CARE IF: °· You have   severe abdominal pain that does not improve with treatment. °· You have nausea that does not go away. °· You cannot keep fluids down. °  °This information is not intended to replace  advice given to you by your health care provider. Make sure you discuss any questions you have with your health care provider. °  °Document Released: 04/06/2005 Document Revised: 08/21/2014 Document Reviewed: 04/02/2014 °Elsevier Interactive Patient Education ©2016 [employer] ° °

## 2015-08-30 NOTE — ED Notes (Signed)
Patient requesting morphine for pain.  MD made aware.  MD at the bedside.

## 2015-08-30 NOTE — ED Notes (Signed)
Patient no longer vomiting.  Reports relief from dry heaving.  Patient laying in bed sleeping when nurse entered room.  Patient ambulatory to bathroom with RN assist x1 without difficulty.

## 2015-08-30 NOTE — ED Notes (Signed)
Patient vomited approximately 150mL.

## 2015-09-08 ENCOUNTER — Emergency Department (HOSPITAL_BASED_OUTPATIENT_CLINIC_OR_DEPARTMENT_OTHER)
Admission: EM | Admit: 2015-09-08 | Discharge: 2015-09-08 | Disposition: A | Discharge status: Home / Self Care | Payer: Medicaid Other | Attending: Emergency Medicine | Admitting: Emergency Medicine

## 2015-09-08 ENCOUNTER — Encounter (HOSPITAL_BASED_OUTPATIENT_CLINIC_OR_DEPARTMENT_OTHER): Payer: Self-pay

## 2015-09-08 DIAGNOSIS — E1143 Type 2 diabetes mellitus with diabetic autonomic (poly)neuropathy: Secondary | ICD-10-CM | POA: Diagnosis not present

## 2015-09-08 DIAGNOSIS — Z79899 Other long term (current) drug therapy: Secondary | ICD-10-CM | POA: Insufficient documentation

## 2015-09-08 DIAGNOSIS — Z7984 Long term (current) use of oral hypoglycemic drugs: Secondary | ICD-10-CM | POA: Insufficient documentation

## 2015-09-08 DIAGNOSIS — I10 Essential (primary) hypertension: Secondary | ICD-10-CM | POA: Diagnosis not present

## 2015-09-08 DIAGNOSIS — Z794 Long term (current) use of insulin: Secondary | ICD-10-CM | POA: Insufficient documentation

## 2015-09-08 DIAGNOSIS — I4891 Unspecified atrial fibrillation: Secondary | ICD-10-CM | POA: Diagnosis not present

## 2015-09-08 DIAGNOSIS — R1110 Vomiting, unspecified: Secondary | ICD-10-CM | POA: Diagnosis present

## 2015-09-08 DIAGNOSIS — E785 Hyperlipidemia, unspecified: Secondary | ICD-10-CM | POA: Diagnosis not present

## 2015-09-08 DIAGNOSIS — J45909 Unspecified asthma, uncomplicated: Secondary | ICD-10-CM | POA: Insufficient documentation

## 2015-09-08 DIAGNOSIS — Z6830 Body mass index (BMI) 30.0-30.9, adult: Secondary | ICD-10-CM | POA: Diagnosis not present

## 2015-09-08 DIAGNOSIS — E669 Obesity, unspecified: Secondary | ICD-10-CM | POA: Insufficient documentation

## 2015-09-08 DIAGNOSIS — K3184 Gastroparesis: Secondary | ICD-10-CM

## 2015-09-08 LAB — COMPREHENSIVE METABOLIC PANEL
ALK PHOS: 86 U/L (ref 38–126)
ALT: 27 U/L (ref 14–54)
AST: 24 U/L (ref 15–41)
Albumin: 4.2 g/dL (ref 3.5–5.0)
Anion gap: 14 (ref 5–15)
BUN: 18 mg/dL (ref 6–20)
CALCIUM: 9.3 mg/dL (ref 8.9–10.3)
CO2: 25 mmol/L (ref 22–32)
CREATININE: 0.69 mg/dL (ref 0.44–1.00)
Chloride: 98 mmol/L — ABNORMAL LOW (ref 101–111)
Glucose, Bld: 310 mg/dL — ABNORMAL HIGH (ref 65–99)
Potassium: 3.1 mmol/L — ABNORMAL LOW (ref 3.5–5.1)
Sodium: 137 mmol/L (ref 135–145)
Total Bilirubin: 0.6 mg/dL (ref 0.3–1.2)
Total Protein: 7.7 g/dL (ref 6.5–8.1)

## 2015-09-08 LAB — URINALYSIS, ROUTINE W REFLEX MICROSCOPIC
Bilirubin Urine: NEGATIVE
Glucose, UA: >1000 mg/dL — AB
HGB URINE DIPSTICK: NEGATIVE
KETONES UR: 40 mg/dL — AB
LEUKOCYTES UA: NEGATIVE
Nitrite: NEGATIVE
PROTEIN: NEGATIVE mg/dL
Specific Gravity, Urine: 1.028 (ref 1.005–1.030)
pH: 8 (ref 5.0–8.0)

## 2015-09-08 LAB — CBC
HCT: 35 % — ABNORMAL LOW (ref 36.0–46.0)
Hemoglobin: 11.7 g/dL — ABNORMAL LOW (ref 12.0–15.0)
MCH: 28.5 pg (ref 26.0–34.0)
MCHC: 33.4 g/dL (ref 30.0–36.0)
MCV: 85.2 fL (ref 78.0–100.0)
PLATELETS: 331 10*3/uL (ref 150–400)
RBC: 4.11 MIL/uL (ref 3.87–5.11)
RDW: 12.7 % (ref 11.5–15.5)
WBC: 8.1 10*3/uL (ref 4.0–10.5)

## 2015-09-08 LAB — URINE MICROSCOPIC-ADD ON

## 2015-09-08 LAB — LIPASE, BLOOD: Lipase: 34 U/L (ref 11–51)

## 2015-09-08 MED ORDER — ONDANSETRON 4 MG PO TBDP: 4.0000 mg | ORAL_TABLET | ORAL | Status: DC | PRN

## 2015-09-08 MED ORDER — DIPHENHYDRAMINE HCL 50 MG/ML IJ SOLN
25.0000 mg | Freq: Once | INTRAMUSCULAR | Status: AC
  Administered 2015-09-08: 25 mg via INTRAVENOUS
  Filled 2015-09-08: qty 1

## 2015-09-08 MED ORDER — METOCLOPRAMIDE HCL 5 MG/ML IJ SOLN
10.0000 mg | Freq: Once | INTRAMUSCULAR | Status: AC
  Administered 2015-09-08: 10 mg via INTRAVENOUS
  Filled 2015-09-08: qty 2

## 2015-09-08 MED ORDER — POTASSIUM CHLORIDE CRYS ER 20 MEQ PO TBCR
40.0000 meq | EXTENDED_RELEASE_TABLET | Freq: Once | ORAL | Status: DC
  Filled 2015-09-08: qty 2

## 2015-09-08 MED ORDER — FAMOTIDINE IN NACL 20-0.9 MG/50ML-% IV SOLN
20.0000 mg | Freq: Once | INTRAVENOUS | Status: AC
  Administered 2015-09-08: 20 mg via INTRAVENOUS
  Filled 2015-09-08: qty 50

## 2015-09-08 MED ORDER — ONDANSETRON HCL 4 MG/2ML IJ SOLN: 4.0000 mg | Freq: Once | INTRAMUSCULAR | Status: DC

## 2015-09-08 MED ORDER — ONDANSETRON HCL 4 MG/2ML IJ SOLN: 4.0000 mg | Freq: Once | INTRAMUSCULAR | Status: DC | PRN

## 2015-09-08 MED ORDER — SODIUM CHLORIDE 0.9 % IV SOLN
1000.0000 mL | Freq: Once | INTRAVENOUS | Status: AC
  Administered 2015-09-08: 1000 mL via INTRAVENOUS

## 2015-09-08 MED ORDER — ONDANSETRON HCL 4 MG/2ML IJ SOLN
INTRAMUSCULAR | Status: AC
  Administered 2015-09-08: 4 mg
  Filled 2015-09-08: qty 2

## 2015-09-08 MED ORDER — SODIUM CHLORIDE 0.9 % IV SOLN
1000.0000 mL | INTRAVENOUS | Status: DC
  Administered 2015-09-08: 1000 mL via INTRAVENOUS

## 2015-09-08 MED ORDER — POTASSIUM CHLORIDE 10 MEQ/100ML IV SOLN
10.0000 meq | Freq: Once | INTRAVENOUS | Status: AC
  Administered 2015-09-08: 10 meq via INTRAVENOUS
  Filled 2015-09-08: qty 100

## 2015-09-08 NOTE — Discharge Instructions (Signed)
Gastroparesis °Gastroparesis, also called delayed gastric emptying, is a condition in which food takes longer than normal to empty from the stomach. The condition is usually long-lasting (chronic). °CAUSES °This condition may be caused by: °· An endocrine disorder, such as hypothyroidism or diabetes. Diabetes is the most common cause of this condition. °· A nervous system disease, such as Parkinson disease or multiple sclerosis. °· Cancer, infection, or surgery of the stomach or vagus nerve. °· A connective tissue disorder, such as scleroderma. °· Certain medicines. °In most cases, the cause is not known. °RISK FACTORS °This condition is more likely to develop in: °· People with certain disorders, including endocrine disorders, eating disorders, amyloidosis, and scleroderma. °· People with certain diseases, including Parkinson disease or multiple sclerosis. °· People with cancer or infection of the stomach or vagus nerve. °· People who have had surgery on the stomach or vagus nerve. °· People who take certain medicines. °· Women. °SYMPTOMS °Symptoms of this condition include: °· An early feeling of fullness when eating. °· Nausea. °· Weight loss. °· Vomiting. °· Heartburn. °· Abdominal bloating. °· Inconsistent blood glucose levels. °· Lack of appetite. °· Acid from the stomach coming up into the esophagus (gastroesophageal reflux). °· Spasms of the stomach. °Symptoms may come and go. °DIAGNOSIS °This condition is diagnosed with tests, such as: °· Tests that check how long it takes food to move through the stomach and intestines. These tests include: °¨ Upper gastrointestinal (GI) series. In this test, X-rays of the intestines are taken after you drink a liquid. The liquid makes the intestines show up better on the X-rays. °¨ Gastric emptying scintigraphy. In this test, scans are taken after you eat food that contains a small amount of radioactive material. °¨ Wireless capsule GI monitoring system. This test  involves swallowing a capsule that records information about movement through the stomach. °· Gastric manometry. This test measures electrical and muscular activity in the stomach. It is done with a thin tube that is passed down the throat and into the stomach. °· Endoscopy. This test checks for abnormalities in the lining of the stomach. It is done with a long, thin tube that is passed down the throat and into the stomach. °· An ultrasound. This test can help rule out gallbladder disease or pancreatitis as a cause of your symptoms. It uses sound waves to take pictures of the inside of your body. °TREATMENT °There is no cure for gastroparesis. This condition may be managed with: °· Treatment of the underlying condition causing the gastroparesis. °· Lifestyle changes, including exercise and dietary changes. Dietary changes can include: °¨ Changes in what and when you eat. °¨ Eating smaller meals more often. °¨ Eating low-fat foods. °¨ Eating low-fiber forms of high-fiber foods, such as cooked vegetables instead of raw vegetables. °¨ Having liquid foods in place of solid foods. Liquid foods are easier to digest. °· Medicines. These may be given to control nausea and vomiting and to stimulate stomach muscles. °· Getting food through a feeding tube. This may be done in severe cases. °· A gastric neurostimulator. This is a device that is inserted into the body with surgery. It helps improve stomach emptying and control nausea and vomiting. °HOME CARE INSTRUCTIONS °· Follow your health care provider's instructions about exercise and diet. °· Take medicines only as directed by your health care provider. °SEEK MEDICAL CARE IF: °· Your symptoms do not improve with treatment. °· You have new symptoms. °SEEK IMMEDIATE MEDICAL CARE IF: °· You have   severe abdominal pain that does not improve with treatment. °· You have nausea that does not go away. °· You cannot keep fluids down. °  °This information is not intended to replace  advice given to you by your health care provider. Make sure you discuss any questions you have with your health care provider. °  °Document Released: 04/06/2005 Document Revised: 08/21/2014 Document Reviewed: 04/02/2014 °Elsevier Interactive Patient Education ©2016 [employer] ° °

## 2015-09-08 NOTE — ED Notes (Signed)
Pt rolling around in the bed. Hyper ventilating and moderate anxiety present. Pt vomiting into emesis bag.

## 2015-09-08 NOTE — ED Provider Notes (Signed)
CSN: [id number]     Arrival date & time 09/08/15  1337 History   First MD Initiated Contact with Patient 09/08/15 1544     Chief Complaint  Patient presents with  . Emesis     (Consider location/radiation/quality/duration/timing/severity/associated sxs/prior Treatment) HPI Patient's vomiting began this morning. She reports multiple episodes. She reports diffuse abdominal pain. No vomiting and no fever. Similar to previous episodes of gastroparesis. Patient has Reglan to take at home. She reports that is not helping. She is requesting a pain medication to help with her symptoms. Past Medical History  Diagnosis Date  . Asthma   . Diabetes mellitus   . Hyperlipidemia   . Hypertension   . GERD (gastroesophageal reflux disease)   . Cerebellar tumor ([place])   . Obesity   . Sleep apnea   . A-fib ([place])   . Thyroid disease   . Chest pain 03/27/2014    negative lexiscan myoview  . Gastroparesis    Past Surgical History  Procedure Laterality Date  . Cholecystectomy    . Partial hysterectomy      ovaries remain  . Nasal sinus surgery      Dr. Lyn Hollingshead  . Abdominal hysterectomy     Family History  Problem Relation Age of Onset  . Coronary artery disease Father   . Hypertension Father   . Diabetes      maternal grandparents  . Hyperlipidemia Other    Social History  Substance Use Topics  . Smoking status: Never Smoker   . Smokeless tobacco: Never Used  . Alcohol Use: No   OB History    No data available     Review of Systems 10 Systems reviewed and are negative for acute change except as noted in the HPI.    Allergies  Review of patient's allergies indicates no known allergies.  Home Medications   Prior to Admission medications   Medication Sig Start Date End Date Taking? Authorizing Provider  albuterol (PROVENTIL HFA;VENTOLIN HFA) 108 (90 BASE) MCG/ACT inhaler Inhale 2 puffs into the lungs every 4 (four) hours as needed for wheezing or shortness of breath.     Historical Provider, MD  ALPRAZolam Duanne Moron) 0.5 MG tablet Take 1 tablet (0.5 mg total) by mouth 2 (two) times daily as needed for anxiety. 10/29/14   Midge Minium, MD  amLODipine (NORVASC) 5 MG tablet Take 5 mg by mouth daily.    Historical Provider, MD  atenolol (TENORMIN) 100 MG tablet Take 1 tablet (100 mg total) by mouth every evening. 10/15/14   Midge Minium, MD  atenolol (TENORMIN) 25 MG tablet Take 1 tablet (25 mg total) by mouth daily as needed. 09/13/14   Sueanne Margarita, MD  atorvastatin (LIPITOR) 80 MG tablet Take 1 tablet (80 mg total) by mouth daily. 11/22/14   Sueanne Margarita, MD  azelastine (OPTIVAR) 0.05 % ophthalmic solution Place 1 drop into both eyes 2 (two) times daily. 10/15/14   Midge Minium, MD  beclomethasone (QVAR) 80 MCG/ACT inhaler Inhale 3 puffs into the lungs daily. Patient taking differently: Inhale 3 puffs into the lungs daily as needed (wheezing).  10/12/14   Colon Branch, MD  canagliflozin (INVOKANA) 300 MG TABS tablet Take 300 mg by mouth daily before breakfast.    Historical Provider, MD  cetirizine (ZYRTEC) 10 MG tablet Take 1 tablet (10 mg total) by mouth daily. 10/15/14   Midge Minium, MD  cloNIDine (CATAPRES) 0.1 MG tablet Take 1 tablet (0.1 mg total)  by mouth 2 (two) times daily. 12/20/14   Midge Minium, MD  dicyclomine (BENTYL) 20 MG tablet Take 1 tablet (20 mg total) by mouth 2 (two) times daily. 12/12/14   Dorie Rank, MD  dicyclomine (BENTYL) 20 MG tablet Take 1 tablet (20 mg total) by mouth 2 (two) times daily. 03/31/15   April Palumbo, MD  escitalopram (LEXAPRO) 10 MG tablet Take 1 tablet (10 mg total) by mouth daily. 10/15/14   Midge Minium, MD  glipiZIDE (GLUCOTROL XL) 10 MG 24 hr tablet Take 10 mg by mouth daily with breakfast.    Historical Provider, MD  glucose blood test strip 1 each by Other route as needed. Onetouch ultra test strip     Historical Provider, MD  insulin aspart (NOVOLOG FLEXPEN) 100 UNIT/ML FlexPen Inject 6 Units  into the skin See admin instructions. Per sliding scale 01/30/15   Historical Provider, MD  insulin glargine (LANTUS) 100 UNIT/ML injection Inject 32 Units into the skin every evening.    Historical Provider, MD  IRON PO Take 2 tablets by mouth 3 (three) times a week.     Historical Provider, MD  LANTUS SOLOSTAR 100 UNIT/ML Solostar Pen Inject 20 Units into the skin 4 (four) times daily -  before meals and at bedtime. AS DIRECTED 02/27/14   Historical Provider, MD  lisinopril-hydrochlorothiazide (PRINZIDE,ZESTORETIC) 20-12.5 MG per tablet Take 1 tablet by mouth 2 (two) times daily. 11/26/14   Midge Minium, MD  metFORMIN (GLUCOPHAGE) 1000 MG tablet Take 1,000 mg by mouth 2 (two) times daily with a meal.    Historical Provider, MD  methimazole (TAPAZOLE) 5 MG tablet Take 7.5 mg by mouth every evening.  02/27/14   Historical Provider, MD  metoCLOPramide (REGLAN) 10 MG tablet Take 1 tablet (10 mg total) by mouth every 6 (six) hours as needed for nausea (nausea/headache). 12/11/14   Orpah Greek, MD  metoCLOPramide (REGLAN) 10 MG tablet Take 1 tablet (10 mg total) by mouth every 6 (six) hours. 03/31/15   April Palumbo, MD  metoCLOPramide (REGLAN) 5 MG tablet Take 1 tablet (5 mg total) by mouth 3 (three) times daily before meals. 02/09/15   Thurnell Lose, MD  Multiple Vitamin (MULTIVITAMIN WITH MINERALS) TABS tablet Take 1 tablet by mouth every evening.     Historical Provider, MD  Omega-3 Fatty Acids (FISH OIL) 1000 MG CAPS Take 2,000 mg by mouth every evening.     Historical Provider, MD  ondansetron (ZOFRAN ODT) 4 MG disintegrating tablet Take 1 tablet (4 mg total) by mouth every 8 (eight) hours as needed for nausea or vomiting. 02/09/15   Thurnell Lose, MD  ondansetron (ZOFRAN ODT) 4 MG disintegrating tablet Take 1 tablet (4 mg total) by mouth every 8 (eight) hours as needed for nausea or vomiting. 03/08/15   Shawn C Joy, PA-C  ondansetron (ZOFRAN ODT) 4 MG disintegrating tablet Take 1  tablet (4 mg total) by mouth every 8 (eight) hours as needed for nausea or vomiting. 08/29/15   Lahoma Crocker Barrett, PA-C  ondansetron (ZOFRAN ODT) 4 MG disintegrating tablet Take 1 tablet (4 mg total) by mouth every 4 (four) hours as needed for nausea or vomiting. 09/08/15   Charlesetta Shanks, MD  pantoprazole (PROTONIX) 40 MG tablet Take 1 tablet (40 mg total) by mouth daily. Patient taking differently: Take 40 mg by mouth 2 (two) times daily.  06/07/14   Midge Minium, MD  polyethylene glycol [facility] / Floria Raveling) packet Take 17 g by mouth  daily. 01/30/15   Historical Provider, MD  potassium chloride (K-DUR) 10 MEQ tablet Take 2 tablets (20 mEq total) by mouth 2 (two) times daily. 02/18/15   Veryl Speak, MD  potassium chloride SA (K-DUR,KLOR-CON) 20 MEQ tablet Take 20 mEq by mouth daily as needed (for leg cramping). Take one tablet by mouth daily. 02/26/12   Midge Minium, MD  promethazine (PHENERGAN) 25 MG tablet Take 1 tablet (25 mg total) by mouth every 6 (six) hours as needed for nausea or vomiting. 02/16/15   Pattricia Boss, MD  rivaroxaban (XARELTO) 20 MG TABS tablet Take 20 mg by mouth daily. 07/02/14   Historical Provider, MD  sucralfate (CARAFATE) 1 G tablet Take 1 tablet (1 g total) by mouth 4 (four) times daily -  with meals and at bedtime. 12/20/14   Midge Minium, MD  Vitamin D, Ergocalciferol, (DRISDOL) 50000 UNITS CAPS capsule Take 50,000 Units by mouth every 7 (seven) days.    Historical Provider, MD   BP 162/64 mmHg  Pulse 63  Temp(Src) 98.4 F (36.9 C) (Oral)  Resp 18  Ht 5\' 1"  (1.549 m)  Wt 160 lb (72.576 kg)  BMI 30.25 kg/m2  SpO2 100% Physical Exam  Constitutional: She is oriented to person, place, and time. She appears well-developed and well-nourished.  I have examined the patient after she has received her first treatment with IV fluids. As I enter the room she is calm and resting. She does not show acute distress. Respirations are calm and nonlabored.  HENT:  Head:  Normocephalic and atraumatic.  Mouth/Throat: Oropharynx is clear and moist.  Eyes: EOM are normal. Pupils are equal, round, and reactive to light.  Neck: Neck supple.  Cardiovascular: Normal rate, regular rhythm, normal heart sounds and intact distal pulses.   Pulmonary/Chest: Effort normal and breath sounds normal.  Abdominal: Soft. Bowel sounds are normal. She exhibits no distension. There is tenderness.  Patient endorses tenderness throughout her abdomen.  Musculoskeletal: Normal range of motion. She exhibits no edema or tenderness.  Neurological: She is alert and oriented to person, place, and time. She has normal strength. No cranial nerve deficit. She exhibits normal muscle tone. Coordination normal. GCS eye subscore is 4. GCS verbal subscore is 5. GCS motor subscore is 6.  Skin: Skin is warm, dry and intact.  Psychiatric: She has a normal mood and affect.    ED Course  Procedures (including critical care time) Labs Review Labs Reviewed  COMPREHENSIVE METABOLIC PANEL - Abnormal; Notable for the following:    Potassium 3.1 (*)    Chloride 98 (*)    Glucose, Bld 310 (*)    All other components within normal limits  CBC - Abnormal; Notable for the following:    Hemoglobin 11.7 (*)    HCT 35.0 (*)    All other components within normal limits  URINALYSIS, ROUTINE W REFLEX MICROSCOPIC (NOT AT [facility]) - Abnormal; Notable for the following:    Glucose, UA >1000 (*)    Ketones, ur 40 (*)    All other components within normal limits  URINE MICROSCOPIC-ADD ON - Abnormal; Notable for the following:    Squamous Epithelial / LPF 0-5 (*)    Bacteria, UA MANY (*)    All other components within normal limits  LIPASE, BLOOD    Imaging Review No results found. I have personally reviewed and evaluated these images and lab results as part of my medical decision-making.   EKG Interpretation None      MDM  Final diagnoses:  Diabetic gastroparesis ([place])   Patient presents with  recurrent episodes of vomiting consistent with prior presentations for diabetic type gastroparesis. Patient is nontoxic in appearance. As I entered the room to check on the patient she was resting quietly and did not show signs of ongoing vomiting. She has had many requests for pain medications. At this time however I do not identify a likely surgical etiology or a non-chronic etiology for the patient's symptoms. Labs do not indicate significant dehydration. Management will be for gastroparesis with fluids, Reglan, Benadryl, Pepcid and potassium supplementation.     Charlesetta Shanks, MD 09/08/15 [phone]

## 2015-09-08 NOTE — ED Notes (Signed)
Pt given d/c instructions as per chart. Verbalizes understanding. No questions. Rx x 1 

## 2015-09-08 NOTE — ED Notes (Signed)
Vomiting since this morning. Reports last emesis was 30 minutes PTA.

## 2015-09-16 ENCOUNTER — Encounter (HOSPITAL_BASED_OUTPATIENT_CLINIC_OR_DEPARTMENT_OTHER): Payer: Self-pay

## 2015-09-16 ENCOUNTER — Emergency Department (HOSPITAL_BASED_OUTPATIENT_CLINIC_OR_DEPARTMENT_OTHER)
Admission: EM | Admit: 2015-09-16 | Discharge: 2015-09-16 | Disposition: A | Discharge status: Home / Self Care | Payer: Medicaid Other | Source: Home / Self Care | Attending: Emergency Medicine | Admitting: Emergency Medicine

## 2015-09-16 ENCOUNTER — Inpatient Hospital Stay (HOSPITAL_BASED_OUTPATIENT_CLINIC_OR_DEPARTMENT_OTHER)
Admission: EM | Admit: 2015-09-16 | Discharge: 2015-09-20 | DRG: 638 | Disposition: A | Discharge status: Home / Self Care | Payer: Medicaid Other | Attending: Internal Medicine | Admitting: Internal Medicine

## 2015-09-16 ENCOUNTER — Emergency Department (HOSPITAL_BASED_OUTPATIENT_CLINIC_OR_DEPARTMENT_OTHER): Payer: Medicaid Other

## 2015-09-16 DIAGNOSIS — K3184 Gastroparesis: Secondary | ICD-10-CM | POA: Diagnosis present

## 2015-09-16 DIAGNOSIS — J45909 Unspecified asthma, uncomplicated: Secondary | ICD-10-CM | POA: Insufficient documentation

## 2015-09-16 DIAGNOSIS — R402142 Coma scale, eyes open, spontaneous, at arrival to emergency department: Secondary | ICD-10-CM | POA: Diagnosis present

## 2015-09-16 DIAGNOSIS — K219 Gastro-esophageal reflux disease without esophagitis: Secondary | ICD-10-CM | POA: Diagnosis present

## 2015-09-16 DIAGNOSIS — E1110 Type 2 diabetes mellitus with ketoacidosis without coma: Secondary | ICD-10-CM

## 2015-09-16 DIAGNOSIS — Z6830 Body mass index (BMI) 30.0-30.9, adult: Secondary | ICD-10-CM

## 2015-09-16 DIAGNOSIS — R6510 Systemic inflammatory response syndrome (SIRS) of non-infectious origin without acute organ dysfunction: Secondary | ICD-10-CM

## 2015-09-16 DIAGNOSIS — F329 Major depressive disorder, single episode, unspecified: Secondary | ICD-10-CM | POA: Diagnosis present

## 2015-09-16 DIAGNOSIS — I10 Essential (primary) hypertension: Secondary | ICD-10-CM | POA: Diagnosis present

## 2015-09-16 DIAGNOSIS — E669 Obesity, unspecified: Secondary | ICD-10-CM

## 2015-09-16 DIAGNOSIS — R1084 Generalized abdominal pain: Secondary | ICD-10-CM

## 2015-09-16 DIAGNOSIS — A419 Sepsis, unspecified organism: Secondary | ICD-10-CM

## 2015-09-16 DIAGNOSIS — E1143 Type 2 diabetes mellitus with diabetic autonomic (poly)neuropathy: Secondary | ICD-10-CM | POA: Diagnosis present

## 2015-09-16 DIAGNOSIS — I480 Paroxysmal atrial fibrillation: Secondary | ICD-10-CM | POA: Diagnosis present

## 2015-09-16 DIAGNOSIS — E860 Dehydration: Secondary | ICD-10-CM | POA: Diagnosis present

## 2015-09-16 DIAGNOSIS — Z6831 Body mass index (BMI) 31.0-31.9, adult: Secondary | ICD-10-CM

## 2015-09-16 DIAGNOSIS — D649 Anemia, unspecified: Secondary | ICD-10-CM | POA: Diagnosis present

## 2015-09-16 DIAGNOSIS — B373 Candidiasis of vulva and vagina: Secondary | ICD-10-CM | POA: Diagnosis not present

## 2015-09-16 DIAGNOSIS — Z79899 Other long term (current) drug therapy: Secondary | ICD-10-CM | POA: Insufficient documentation

## 2015-09-16 DIAGNOSIS — E785 Hyperlipidemia, unspecified: Secondary | ICD-10-CM

## 2015-09-16 DIAGNOSIS — D332 Benign neoplasm of brain, unspecified: Secondary | ICD-10-CM | POA: Diagnosis present

## 2015-09-16 DIAGNOSIS — N390 Urinary tract infection, site not specified: Secondary | ICD-10-CM

## 2015-09-16 DIAGNOSIS — I959 Hypotension, unspecified: Secondary | ICD-10-CM | POA: Diagnosis not present

## 2015-09-16 DIAGNOSIS — Z794 Long term (current) use of insulin: Secondary | ICD-10-CM

## 2015-09-16 DIAGNOSIS — E1310 Other specified diabetes mellitus with ketoacidosis without coma: Principal | ICD-10-CM | POA: Diagnosis present

## 2015-09-16 DIAGNOSIS — E0500 Thyrotoxicosis with diffuse goiter without thyrotoxic crisis or storm: Secondary | ICD-10-CM | POA: Diagnosis present

## 2015-09-16 DIAGNOSIS — R402252 Coma scale, best verbal response, oriented, at arrival to emergency department: Secondary | ICD-10-CM | POA: Diagnosis present

## 2015-09-16 DIAGNOSIS — F419 Anxiety disorder, unspecified: Secondary | ICD-10-CM | POA: Diagnosis present

## 2015-09-16 DIAGNOSIS — I4891 Unspecified atrial fibrillation: Secondary | ICD-10-CM | POA: Insufficient documentation

## 2015-09-16 DIAGNOSIS — R402362 Coma scale, best motor response, obeys commands, at arrival to emergency department: Secondary | ICD-10-CM | POA: Diagnosis present

## 2015-09-16 DIAGNOSIS — G4730 Sleep apnea, unspecified: Secondary | ICD-10-CM | POA: Diagnosis present

## 2015-09-16 DIAGNOSIS — N179 Acute kidney failure, unspecified: Secondary | ICD-10-CM | POA: Diagnosis present

## 2015-09-16 DIAGNOSIS — E876 Hypokalemia: Secondary | ICD-10-CM | POA: Diagnosis not present

## 2015-09-16 DIAGNOSIS — R739 Hyperglycemia, unspecified: Secondary | ICD-10-CM

## 2015-09-16 HISTORY — DX: Sepsis, unspecified organism: A41.9

## 2015-09-16 HISTORY — DX: Nausea with vomiting, unspecified: R11.2

## 2015-09-16 HISTORY — DX: Unspecified abdominal pain: R10.9

## 2015-09-16 HISTORY — DX: Other chronic pain: G89.29

## 2015-09-16 HISTORY — DX: Type 2 diabetes mellitus with ketoacidosis without coma: E11.10

## 2015-09-16 LAB — COMPREHENSIVE METABOLIC PANEL
ALBUMIN: 4.1 g/dL (ref 3.5–5.0)
ALK PHOS: 86 U/L (ref 38–126)
ALT: 22 U/L (ref 14–54)
ALT: 24 U/L (ref 14–54)
ANION GAP: 13 (ref 5–15)
ANION GAP: 20 — AB (ref 5–15)
AST: 22 U/L (ref 15–41)
AST: 34 U/L (ref 15–41)
Albumin: 4 g/dL (ref 3.5–5.0)
Alkaline Phosphatase: 89 U/L (ref 38–126)
BILIRUBIN TOTAL: 0.5 mg/dL (ref 0.3–1.2)
BILIRUBIN TOTAL: 1 mg/dL (ref 0.3–1.2)
BUN: 17 mg/dL (ref 6–20)
BUN: 15 mg/dL (ref 6–20)
CALCIUM: 9.5 mg/dL (ref 8.9–10.3)
CHLORIDE: 96 mmol/L — AB (ref 101–111)
CO2: 28 mmol/L (ref 22–32)
CO2: 23 mmol/L (ref 22–32)
CREATININE: 0.85 mg/dL (ref 0.44–1.00)
Calcium: 9.2 mg/dL (ref 8.9–10.3)
Chloride: 99 mmol/L — ABNORMAL LOW (ref 101–111)
Creatinine, Ser: 0.89 mg/dL (ref 0.44–1.00)
GFR calc Af Amer: >60 mL/min (ref >60)
GFR calc non Af Amer: >60 mL/min (ref >60)
GFR calc non Af Amer: >60 mL/min (ref >60)
GLUCOSE: 339 mg/dL — AB (ref 65–99)
Glucose, Bld: 302 mg/dL — ABNORMAL HIGH (ref 65–99)
POTASSIUM: 3.6 mmol/L (ref 3.5–5.1)
Potassium: 3 mmol/L — ABNORMAL LOW (ref 3.5–5.1)
SODIUM: 139 mmol/L (ref 135–145)
Sodium: 140 mmol/L (ref 135–145)
TOTAL PROTEIN: 7.6 g/dL (ref 6.5–8.1)
TOTAL PROTEIN: 8 g/dL (ref 6.5–8.1)

## 2015-09-16 LAB — CBC WITH DIFFERENTIAL/PLATELET
Basophils Absolute: 0 10*3/uL (ref 0.0–0.1)
Basophils Absolute: 0 10*3/uL (ref 0.0–0.1)
Basophils Relative: 0 %
Basophils Relative: 0 %
EOS ABS: 0 10*3/uL (ref 0.0–0.7)
EOS ABS: 0 10*3/uL (ref 0.0–0.7)
EOS PCT: 0 %
Eosinophils Relative: 0 %
HEMATOCRIT: 34.8 % — AB (ref 36.0–46.0)
HEMATOCRIT: 34.5 % — AB (ref 36.0–46.0)
HEMOGLOBIN: 11.5 g/dL — AB (ref 12.0–15.0)
Hemoglobin: 11.5 g/dL — ABNORMAL LOW (ref 12.0–15.0)
LYMPHS ABS: 1.3 10*3/uL (ref 0.7–4.0)
LYMPHS PCT: 19 %
Lymphocytes Relative: 9 %
Lymphs Abs: 0.8 10*3/uL (ref 0.7–4.0)
MCH: 28.8 pg (ref 26.0–34.0)
MCH: 29 pg (ref 26.0–34.0)
MCHC: 33 g/dL (ref 30.0–36.0)
MCHC: 33.3 g/dL (ref 30.0–36.0)
MCV: 87.2 fL (ref 78.0–100.0)
MCV: 86.9 fL (ref 78.0–100.0)
MONO ABS: 0.2 10*3/uL (ref 0.1–1.0)
MONOS PCT: 4 %
MONOS PCT: 2 %
Monocytes Absolute: 0.3 10*3/uL (ref 0.1–1.0)
NEUTROS ABS: 5.1 10*3/uL (ref 1.7–7.7)
NEUTROS PCT: 77 %
Neutro Abs: 7.9 10*3/uL — ABNORMAL HIGH (ref 1.7–7.7)
Neutrophils Relative %: 89 %
PLATELETS: 350 10*3/uL (ref 150–400)
Platelets: 351 10*3/uL (ref 150–400)
RBC: 3.99 MIL/uL (ref 3.87–5.11)
RBC: 3.97 MIL/uL (ref 3.87–5.11)
RDW: 12.5 % (ref 11.5–15.5)
RDW: 12.7 % (ref 11.5–15.5)
WBC: 6.6 10*3/uL (ref 4.0–10.5)
WBC: 8.9 10*3/uL (ref 4.0–10.5)

## 2015-09-16 LAB — I-STAT CG4 LACTIC ACID, ED: Lactic Acid, Venous: 6.34 mmol/L (ref 0.5–2.0)

## 2015-09-16 LAB — URINE MICROSCOPIC-ADD ON: Bacteria, UA: NONE SEEN

## 2015-09-16 LAB — URINALYSIS, ROUTINE W REFLEX MICROSCOPIC
Bilirubin Urine: NEGATIVE
Bilirubin Urine: NEGATIVE
Glucose, UA: >1000 mg/dL — AB
Hgb urine dipstick: NEGATIVE
KETONES UR: 15 mg/dL — AB
LEUKOCYTES UA: NEGATIVE
LEUKOCYTES UA: NEGATIVE
NITRITE: NEGATIVE
Nitrite: NEGATIVE
PH: 7.5 (ref 5.0–8.0)
PH: 7.5 (ref 5.0–8.0)
PROTEIN: NEGATIVE mg/dL
Protein, ur: NEGATIVE mg/dL
SPECIFIC GRAVITY, URINE: 1.023 (ref 1.005–1.030)
Specific Gravity, Urine: 1.022 (ref 1.005–1.030)

## 2015-09-16 LAB — TROPONIN I: Troponin I: 0.18 ng/mL — ABNORMAL HIGH (ref <0.031)

## 2015-09-16 LAB — LIPASE, BLOOD: Lipase: 37 U/L (ref 11–51)

## 2015-09-16 LAB — CBG MONITORING, ED: GLUCOSE-CAPILLARY: 293 mg/dL — AB (ref 65–99)

## 2015-09-16 MED ORDER — DEXTROSE 5 % IV SOLN
1.0000 g | Freq: Once | INTRAVENOUS | Status: AC
  Administered 2015-09-16: 1 g via INTRAVENOUS
  Filled 2015-09-16: qty 10

## 2015-09-16 MED ORDER — PIPERACILLIN-TAZOBACTAM 3.375 G IVPB 30 MIN
3.3750 g | Freq: Once | INTRAVENOUS | Status: AC
  Administered 2015-09-16: 3.375 g via INTRAVENOUS
  Filled 2015-09-16 (×2): qty 50

## 2015-09-16 MED ORDER — ACETAMINOPHEN 500 MG PO TABS
1000.0000 mg | ORAL_TABLET | Freq: Once | ORAL | Status: AC
  Administered 2015-09-16: 1000 mg via ORAL
  Filled 2015-09-16: qty 2

## 2015-09-16 MED ORDER — SODIUM CHLORIDE 0.9 % IV BOLUS (SEPSIS)
1000.0000 mL | Freq: Once | INTRAVENOUS | Status: AC
  Administered 2015-09-17: 1000 mL via INTRAVENOUS

## 2015-09-16 MED ORDER — INSULIN REGULAR HUMAN 100 UNIT/ML IJ SOLN
INTRAMUSCULAR | Status: DC
  Administered 2015-09-16: 2.3 [IU]/h via INTRAVENOUS
  Administered 2015-09-17: 5.3 [IU]/h via INTRAVENOUS
  Administered 2015-09-17: 3.9 [IU]/h via INTRAVENOUS
  Filled 2015-09-16 (×2): qty 2.5

## 2015-09-16 MED ORDER — SODIUM CHLORIDE 0.9 % IV BOLUS (SEPSIS)
1000.0000 mL | Freq: Once | INTRAVENOUS | Status: AC
  Administered 2015-09-16: 1000 mL via INTRAVENOUS

## 2015-09-16 MED ORDER — PROCHLORPERAZINE EDISYLATE 5 MG/ML IJ SOLN
10.0000 mg | Freq: Once | INTRAMUSCULAR | Status: AC
  Administered 2015-09-16: 10 mg via INTRAVENOUS
  Filled 2015-09-16: qty 2

## 2015-09-16 MED ORDER — DEXTROSE-NACL 5-0.45 % IV SOLN
INTRAVENOUS | Status: DC
Start: 2015-09-16 — End: 2015-09-17

## 2015-09-16 MED ORDER — ONDANSETRON HCL 4 MG/2ML IJ SOLN
4.0000 mg | Freq: Once | INTRAMUSCULAR | Status: AC
  Administered 2015-09-16: 4 mg via INTRAVENOUS
  Filled 2015-09-16: qty 2

## 2015-09-16 MED ORDER — VANCOMYCIN HCL IN DEXTROSE 1-5 GM/200ML-% IV SOLN
1000.0000 mg | Freq: Once | INTRAVENOUS | Status: AC
  Administered 2015-09-17: 1000 mg via INTRAVENOUS
  Filled 2015-09-16: qty 200

## 2015-09-16 MED ORDER — PROMETHAZINE HCL 25 MG PO TABS: 25.0000 mg | ORAL_TABLET | Freq: Four times a day (QID) | ORAL | Status: AC | PRN

## 2015-09-16 MED ORDER — MORPHINE SULFATE (PF) 4 MG/ML IV SOLN
4.0000 mg | Freq: Once | INTRAVENOUS | Status: AC
Start: 2015-09-16 — End: 2015-09-16
  Administered 2015-09-16: 4 mg via INTRAVENOUS
  Filled 2015-09-16: qty 1

## 2015-09-16 MED ORDER — SULFAMETHOXAZOLE-TRIMETHOPRIM 800-160 MG PO TABS: 1.0000 | ORAL_TABLET | Freq: Two times a day (BID) | ORAL | Status: DC

## 2015-09-16 MED ORDER — ONDANSETRON HCL 4 MG/2ML IJ SOLN
4.0000 mg | Freq: Once | INTRAMUSCULAR | Status: AC
Start: 2015-09-16 — End: 2015-09-16
  Administered 2015-09-16: 4 mg via INTRAVENOUS
  Filled 2015-09-16: qty 2

## 2015-09-16 MED ORDER — SODIUM CHLORIDE 0.9 % IV BOLUS (SEPSIS)
250.0000 mL | Freq: Once | INTRAVENOUS | Status: AC
  Administered 2015-09-17: 250 mL via INTRAVENOUS

## 2015-09-16 NOTE — ED Provider Notes (Signed)
CSN: [id number]     Arrival date & time 09/16/15  1202 History   First MD Initiated Contact with Patient 09/16/15 1217     Chief Complaint  Patient presents with  . Emesis  PT PRESENTS TO THE ED TODAY WITH VOMITING.  THE PT HAS A HX OF GASTROPARESIS AND HAS A GI DR AT [facility].  PT HAS BEEN TO THE ED NUMEROUS TIMES FOR THE SAME.  THE PT SAID THAT SHE STARTED VOMITING AROUND 0700.  (Consider location/radiation/quality/duration/timing/severity/associated sxs/prior Treatment) Patient is a 50 y.o. female presenting with vomiting. The history is provided by the patient.  Emesis Severity:  Moderate Timing:  Constant Quality:  Bilious material Progression:  Worsening Chronicity:  Recurrent Recent urination:  Normal Relieved by:  Nothing Worsened by:  Nothing tried Ineffective treatments:  None tried Associated symptoms: abdominal pain     Past Medical History  Diagnosis Date  . Asthma   . Diabetes mellitus   . Hyperlipidemia   . Hypertension   . GERD (gastroesophageal reflux disease)   . Cerebellar tumor ([place])   . Obesity   . Sleep apnea   . A-fib ([place])   . Thyroid disease   . Chest pain 03/27/2014    negative lexiscan myoview  . Gastroparesis    Past Surgical History  Procedure Laterality Date  . Cholecystectomy    . Partial hysterectomy      ovaries remain  . Nasal sinus surgery      Dr. Lyn Hollingshead  . Abdominal hysterectomy     Family History  Problem Relation Age of Onset  . Coronary artery disease Father   . Hypertension Father   . Diabetes      maternal grandparents  . Hyperlipidemia Other    Social History  Substance Use Topics  . Smoking status: Never Smoker   . Smokeless tobacco: Never Used  . Alcohol Use: No   OB History    No data available     Review of Systems  Gastrointestinal: Positive for vomiting and abdominal pain.  All other systems reviewed and are negative.     Allergies  Review of patient's allergies indicates no known  allergies.  Home Medications   Prior to Admission medications   Medication Sig Start Date End Date Taking? Authorizing Provider  albuterol (PROVENTIL HFA;VENTOLIN HFA) 108 (90 BASE) MCG/ACT inhaler Inhale 2 puffs into the lungs every 4 (four) hours as needed for wheezing or shortness of breath.    Historical Provider, MD  ALPRAZolam Duanne Moron) 0.5 MG tablet Take 1 tablet (0.5 mg total) by mouth 2 (two) times daily as needed for anxiety. 10/29/14   Midge Minium, MD  amLODipine (NORVASC) 5 MG tablet Take 5 mg by mouth daily.    Historical Provider, MD  atenolol (TENORMIN) 100 MG tablet Take 1 tablet (100 mg total) by mouth every evening. 10/15/14   Midge Minium, MD  atenolol (TENORMIN) 25 MG tablet Take 1 tablet (25 mg total) by mouth daily as needed. 09/13/14   Sueanne Margarita, MD  atorvastatin (LIPITOR) 80 MG tablet Take 1 tablet (80 mg total) by mouth daily. 11/22/14   Sueanne Margarita, MD  azelastine (OPTIVAR) 0.05 % ophthalmic solution Place 1 drop into both eyes 2 (two) times daily. 10/15/14   Midge Minium, MD  beclomethasone (QVAR) 80 MCG/ACT inhaler Inhale 3 puffs into the lungs daily. Patient taking differently: Inhale 3 puffs into the lungs daily as needed (wheezing).  10/12/14   Colon Branch, MD  canagliflozin (INVOKANA) 300 MG TABS tablet Take 300 mg by mouth daily before breakfast.    Historical Provider, MD  cetirizine (ZYRTEC) 10 MG tablet Take 1 tablet (10 mg total) by mouth daily. 10/15/14   Midge Minium, MD  cloNIDine (CATAPRES) 0.1 MG tablet Take 1 tablet (0.1 mg total) by mouth 2 (two) times daily. 12/20/14   Midge Minium, MD  dicyclomine (BENTYL) 20 MG tablet Take 1 tablet (20 mg total) by mouth 2 (two) times daily. 12/12/14   Dorie Rank, MD  dicyclomine (BENTYL) 20 MG tablet Take 1 tablet (20 mg total) by mouth 2 (two) times daily. 03/31/15   April Palumbo, MD  escitalopram (LEXAPRO) 10 MG tablet Take 1 tablet (10 mg total) by mouth daily. 10/15/14   Midge Minium, MD  glipiZIDE (GLUCOTROL XL) 10 MG 24 hr tablet Take 10 mg by mouth daily with breakfast.    Historical Provider, MD  glucose blood test strip 1 each by Other route as needed. Onetouch ultra test strip     Historical Provider, MD  insulin aspart (NOVOLOG FLEXPEN) 100 UNIT/ML FlexPen Inject 6 Units into the skin See admin instructions. Per sliding scale 01/30/15   Historical Provider, MD  insulin glargine (LANTUS) 100 UNIT/ML injection Inject 32 Units into the skin every evening.    Historical Provider, MD  IRON PO Take 2 tablets by mouth 3 (three) times a week.     Historical Provider, MD  LANTUS SOLOSTAR 100 UNIT/ML Solostar Pen Inject 20 Units into the skin 4 (four) times daily -  before meals and at bedtime. AS DIRECTED 02/27/14   Historical Provider, MD  lisinopril-hydrochlorothiazide (PRINZIDE,ZESTORETIC) 20-12.5 MG per tablet Take 1 tablet by mouth 2 (two) times daily. 11/26/14   Midge Minium, MD  metFORMIN (GLUCOPHAGE) 1000 MG tablet Take 1,000 mg by mouth 2 (two) times daily with a meal.    Historical Provider, MD  methimazole (TAPAZOLE) 5 MG tablet Take 7.5 mg by mouth every evening.  02/27/14   Historical Provider, MD  metoCLOPramide (REGLAN) 10 MG tablet Take 1 tablet (10 mg total) by mouth every 6 (six) hours as needed for nausea (nausea/headache). 12/11/14   Orpah Greek, MD  metoCLOPramide (REGLAN) 10 MG tablet Take 1 tablet (10 mg total) by mouth every 6 (six) hours. 03/31/15   April Palumbo, MD  metoCLOPramide (REGLAN) 5 MG tablet Take 1 tablet (5 mg total) by mouth 3 (three) times daily before meals. 02/09/15   Thurnell Lose, MD  Multiple Vitamin (MULTIVITAMIN WITH MINERALS) TABS tablet Take 1 tablet by mouth every evening.     Historical Provider, MD  Omega-3 Fatty Acids (FISH OIL) 1000 MG CAPS Take 2,000 mg by mouth every evening.     Historical Provider, MD  ondansetron (ZOFRAN ODT) 4 MG disintegrating tablet Take 1 tablet (4 mg total) by mouth every 8  (eight) hours as needed for nausea or vomiting. 02/09/15   Thurnell Lose, MD  ondansetron (ZOFRAN ODT) 4 MG disintegrating tablet Take 1 tablet (4 mg total) by mouth every 8 (eight) hours as needed for nausea or vomiting. 03/08/15   Shawn C Joy, PA-C  ondansetron (ZOFRAN ODT) 4 MG disintegrating tablet Take 1 tablet (4 mg total) by mouth every 8 (eight) hours as needed for nausea or vomiting. 08/29/15   Lahoma Crocker Barrett, PA-C  ondansetron (ZOFRAN ODT) 4 MG disintegrating tablet Take 1 tablet (4 mg total) by mouth every 4 (four) hours as needed for nausea or  vomiting. 09/08/15   Charlesetta Shanks, MD  pantoprazole (PROTONIX) 40 MG tablet Take 1 tablet (40 mg total) by mouth daily. Patient taking differently: Take 40 mg by mouth 2 (two) times daily.  06/07/14   Midge Minium, MD  polyethylene glycol [facility] / Floria Raveling) packet Take 17 g by mouth daily. 01/30/15   Historical Provider, MD  potassium chloride (K-DUR) 10 MEQ tablet Take 2 tablets (20 mEq total) by mouth 2 (two) times daily. 02/18/15   Veryl Speak, MD  potassium chloride SA (K-DUR,KLOR-CON) 20 MEQ tablet Take 20 mEq by mouth daily as needed (for leg cramping). Take one tablet by mouth daily. 02/26/12   Midge Minium, MD  promethazine (PHENERGAN) 25 MG tablet Take 1 tablet (25 mg total) by mouth every 6 (six) hours as needed for nausea or vomiting. 09/16/15   Isla Pence, MD  rivaroxaban (XARELTO) 20 MG TABS tablet Take 20 mg by mouth daily. 07/02/14   Historical Provider, MD  sucralfate (CARAFATE) 1 G tablet Take 1 tablet (1 g total) by mouth 4 (four) times daily -  with meals and at bedtime. 12/20/14   Midge Minium, MD  sulfamethoxazole-trimethoprim (BACTRIM DS,SEPTRA DS) 800-160 MG tablet Take 1 tablet by mouth 2 (two) times daily. 09/16/15 09/23/15  Isla Pence, MD  Vitamin D, Ergocalciferol, (DRISDOL) 50000 UNITS CAPS capsule Take 50,000 Units by mouth every 7 (seven) days.    Historical Provider, MD   BP 189/92 mmHg  Pulse 89   Temp(Src) 99 F (37.2 C) (Oral)  Resp 17  Ht 5\' 1"  (1.549 m)  Wt 160 lb (72.576 kg)  BMI 30.25 kg/m2  SpO2 100% Physical Exam  Constitutional: She is oriented to person, place, and time. She appears well-developed and well-nourished.  HENT:  Head: Normocephalic and atraumatic.  Right Ear: External ear normal.  Left Ear: External ear normal.  Nose: Nose normal.  Mouth/Throat: Oropharynx is clear and moist.  Eyes: Conjunctivae and EOM are normal. Pupils are equal, round, and reactive to light.  Neck: Normal range of motion. Neck supple.  Cardiovascular: Normal rate, regular rhythm, normal heart sounds and intact distal pulses.   Pulmonary/Chest: Effort normal and breath sounds normal.  Abdominal: Soft. Bowel sounds are normal.  Musculoskeletal: Normal range of motion.  Neurological: She is alert and oriented to person, place, and time.  Skin: Skin is warm and dry.  Psychiatric: She has a normal mood and affect. Her behavior is normal. Judgment and thought content normal.  Nursing note and vitals reviewed.   ED Course  Procedures (including critical care time) Labs Review Labs Reviewed  URINALYSIS, ROUTINE W REFLEX MICROSCOPIC (NOT AT [facility]) - Abnormal; Notable for the following:    APPearance CLOUDY (*)    Glucose, UA >1000 (*)    Ketones, ur 15 (*)    All other components within normal limits  TROPONIN I - Abnormal; Notable for the following:    Troponin I 0.18 (*)    All other components within normal limits  CBC WITH DIFFERENTIAL/PLATELET - Abnormal; Notable for the following:    Hemoglobin 11.5 (*)    HCT 34.8 (*)    All other components within normal limits  COMPREHENSIVE METABOLIC PANEL - Abnormal; Notable for the following:    Potassium 3.0 (*)    Chloride 99 (*)    Glucose, Bld 302 (*)    All other components within normal limits  URINE MICROSCOPIC-ADD ON - Abnormal; Notable for the following:    Squamous Epithelial /  LPF 6-30 (*)    Bacteria, UA MANY (*)     All other components within normal limits  URINE CULTURE  LIPASE, BLOOD  TROPONIN I  CBC WITH DIFFERENTIAL/PLATELET    Imaging Review No results found. I have personally reviewed and evaluated these images and lab results as part of my medical decision-making.   EKG Interpretation   Date/Time:  Monday Sep 16 2015 13:48:28 EDT Ventricular Rate:  79 PR Interval:  128 QRS Duration: 102 QT Interval:  392 QTC Calculation: 449 R Axis:   0 Text Interpretation:  Sinus rhythm Left ventricular hypertrophy Confirmed  by Hurshel Bouillon MD, Lindsay Soulliere ([id number]) on 09/16/2015 3:04:45 PM      MDM  PT IS FEELING BETTER.  SHE HAD A TROPONIN INITIALLY THAT WAS HIGH, BUT SHE WAS A TOUGH STICK, SO I REPEATED IT AND IT WAS NL.  PT DENIES CP.  PT KNOWS TO RETURN IF WORSE.  Final diagnoses:  Diabetic gastroparesis ([place])  Generalized abdominal pain  UTI (lower urinary tract infection)        Isla Pence, MD 09/16/15 [phone]

## 2015-09-16 NOTE — ED Provider Notes (Signed)
CSN: [id number]     Arrival date & time 09/16/15  2149 History   First MD Initiated Contact with Patient 09/16/15 2223     Chief Complaint  Patient presents with  . Emesis     (Consider location/radiation/quality/duration/timing/severity/associated sxs/prior Treatment) HPI Comments: Patient with history of frequent presentations to the emergency department for diabetic gastroparesis, seen earlier today in the emergency department for same, treated with IV Rocephin for possible UTI, history of HTN -- returns with continued vomiting. Patient now has a fever. She continues to have multiple episodes of vomiting despite using Phenergan that she was discharged with earlier.  No URI symptoms or diarrhea. Patient denies urinary symptoms. States that her entire body feels tingly and her face feels numb. Patient denies signs of stroke including: facial droop, slurred speech, aphasia, weakness in extremities, imbalance/trouble walking.The onset of this condition was acute. The course is constant. Aggravating factors: none. Alleviating factors: none.    Patient is a 49 y.o. female presenting with vomiting. The history is provided by the patient and medical records.  Emesis Associated symptoms: abdominal pain   Associated symptoms: no diarrhea, no headaches, no myalgias and no sore throat     Past Medical History  Diagnosis Date  . Asthma   . Diabetes mellitus   . Hyperlipidemia   . Hypertension   . GERD (gastroesophageal reflux disease)   . Cerebellar tumor ([place])   . Obesity   . Sleep apnea   . A-fib ([place])   . Thyroid disease   . Chest pain 03/27/2014    negative lexiscan myoview  . Gastroparesis   . Chronic abdominal pain    Past Surgical History  Procedure Laterality Date  . Cholecystectomy    . Partial hysterectomy      ovaries remain  . Nasal sinus surgery      Dr. Lyn Hollingshead  . Abdominal hysterectomy     Family History  Problem Relation Age of Onset  . Coronary artery disease  Father   . Hypertension Father   . Diabetes      maternal grandparents  . Hyperlipidemia Other    Social History  Substance Use Topics  . Smoking status: Never Smoker   . Smokeless tobacco: Never Used  . Alcohol Use: No   OB History    No data available     Review of Systems  Constitutional: Positive for fever.  HENT: Negative for congestion, rhinorrhea and sore throat.   Eyes: Negative for redness.  Respiratory: Positive for shortness of breath. Negative for cough and wheezing.   Cardiovascular: Negative for chest pain.  Gastrointestinal: Positive for nausea, vomiting and abdominal pain. Negative for diarrhea.  Genitourinary: Negative for dysuria.  Musculoskeletal: Negative for myalgias.  Skin: Negative for rash.  Neurological: Negative for headaches.    Allergies  Review of patient's allergies indicates no known allergies.  Home Medications   Prior to Admission medications   Medication Sig Start Date End Date Taking? Authorizing Provider  albuterol (PROVENTIL HFA;VENTOLIN HFA) 108 (90 BASE) MCG/ACT inhaler Inhale 2 puffs into the lungs every 4 (four) hours as needed for wheezing or shortness of breath.    Historical Provider, MD  ALPRAZolam Duanne Moron) 0.5 MG tablet Take 1 tablet (0.5 mg total) by mouth 2 (two) times daily as needed for anxiety. 10/29/14   Midge Minium, MD  amLODipine (NORVASC) 5 MG tablet Take 5 mg by mouth daily.    Historical Provider, MD  atenolol (TENORMIN) 100 MG tablet Take 1 tablet (  100 mg total) by mouth every evening. 10/15/14   Midge Minium, MD  atenolol (TENORMIN) 25 MG tablet Take 1 tablet (25 mg total) by mouth daily as needed. 09/13/14   Sueanne Margarita, MD  atorvastatin (LIPITOR) 80 MG tablet Take 1 tablet (80 mg total) by mouth daily. 11/22/14   Sueanne Margarita, MD  azelastine (OPTIVAR) 0.05 % ophthalmic solution Place 1 drop into both eyes 2 (two) times daily. 10/15/14   Midge Minium, MD  beclomethasone (QVAR) 80 MCG/ACT inhaler  Inhale 3 puffs into the lungs daily. Patient taking differently: Inhale 3 puffs into the lungs daily as needed (wheezing).  10/12/14   Colon Branch, MD  canagliflozin (INVOKANA) 300 MG TABS tablet Take 300 mg by mouth daily before breakfast.    Historical Provider, MD  cetirizine (ZYRTEC) 10 MG tablet Take 1 tablet (10 mg total) by mouth daily. 10/15/14   Midge Minium, MD  cloNIDine (CATAPRES) 0.1 MG tablet Take 1 tablet (0.1 mg total) by mouth 2 (two) times daily. 12/20/14   Midge Minium, MD  dicyclomine (BENTYL) 20 MG tablet Take 1 tablet (20 mg total) by mouth 2 (two) times daily. 12/12/14   Dorie Rank, MD  dicyclomine (BENTYL) 20 MG tablet Take 1 tablet (20 mg total) by mouth 2 (two) times daily. 03/31/15   April Palumbo, MD  escitalopram (LEXAPRO) 10 MG tablet Take 1 tablet (10 mg total) by mouth daily. 10/15/14   Midge Minium, MD  glipiZIDE (GLUCOTROL XL) 10 MG 24 hr tablet Take 10 mg by mouth daily with breakfast.    Historical Provider, MD  glucose blood test strip 1 each by Other route as needed. Onetouch ultra test strip     Historical Provider, MD  insulin aspart (NOVOLOG FLEXPEN) 100 UNIT/ML FlexPen Inject 6 Units into the skin See admin instructions. Per sliding scale 01/30/15   Historical Provider, MD  insulin glargine (LANTUS) 100 UNIT/ML injection Inject 32 Units into the skin every evening.    Historical Provider, MD  IRON PO Take 2 tablets by mouth 3 (three) times a week.     Historical Provider, MD  LANTUS SOLOSTAR 100 UNIT/ML Solostar Pen Inject 20 Units into the skin 4 (four) times daily -  before meals and at bedtime. AS DIRECTED 02/27/14   Historical Provider, MD  lisinopril-hydrochlorothiazide (PRINZIDE,ZESTORETIC) 20-12.5 MG per tablet Take 1 tablet by mouth 2 (two) times daily. 11/26/14   Midge Minium, MD  metFORMIN (GLUCOPHAGE) 1000 MG tablet Take 1,000 mg by mouth 2 (two) times daily with a meal.    Historical Provider, MD  methimazole (TAPAZOLE) 5 MG tablet  Take 7.5 mg by mouth every evening.  02/27/14   Historical Provider, MD  metoCLOPramide (REGLAN) 10 MG tablet Take 1 tablet (10 mg total) by mouth every 6 (six) hours as needed for nausea (nausea/headache). 12/11/14   Orpah Greek, MD  metoCLOPramide (REGLAN) 10 MG tablet Take 1 tablet (10 mg total) by mouth every 6 (six) hours. 03/31/15   April Palumbo, MD  metoCLOPramide (REGLAN) 5 MG tablet Take 1 tablet (5 mg total) by mouth 3 (three) times daily before meals. 02/09/15   Thurnell Lose, MD  Multiple Vitamin (MULTIVITAMIN WITH MINERALS) TABS tablet Take 1 tablet by mouth every evening.     Historical Provider, MD  Omega-3 Fatty Acids (FISH OIL) 1000 MG CAPS Take 2,000 mg by mouth every evening.     Historical Provider, MD  ondansetron (ZOFRAN ODT)  4 MG disintegrating tablet Take 1 tablet (4 mg total) by mouth every 8 (eight) hours as needed for nausea or vomiting. 02/09/15   Thurnell Lose, MD  ondansetron (ZOFRAN ODT) 4 MG disintegrating tablet Take 1 tablet (4 mg total) by mouth every 8 (eight) hours as needed for nausea or vomiting. 03/08/15   Shawn C Joy, PA-C  ondansetron (ZOFRAN ODT) 4 MG disintegrating tablet Take 1 tablet (4 mg total) by mouth every 8 (eight) hours as needed for nausea or vomiting. 08/29/15   Lahoma Crocker Barrett, PA-C  ondansetron (ZOFRAN ODT) 4 MG disintegrating tablet Take 1 tablet (4 mg total) by mouth every 4 (four) hours as needed for nausea or vomiting. 09/08/15   Charlesetta Shanks, MD  pantoprazole (PROTONIX) 40 MG tablet Take 1 tablet (40 mg total) by mouth daily. Patient taking differently: Take 40 mg by mouth 2 (two) times daily.  06/07/14   Midge Minium, MD  polyethylene glycol [facility] / Floria Raveling) packet Take 17 g by mouth daily. 01/30/15   Historical Provider, MD  potassium chloride (K-DUR) 10 MEQ tablet Take 2 tablets (20 mEq total) by mouth 2 (two) times daily. 02/18/15   Veryl Speak, MD  potassium chloride SA (K-DUR,KLOR-CON) 20 MEQ tablet Take 20 mEq  by mouth daily as needed (for leg cramping). Take one tablet by mouth daily. 02/26/12   Midge Minium, MD  promethazine (PHENERGAN) 25 MG tablet Take 1 tablet (25 mg total) by mouth every 6 (six) hours as needed for nausea or vomiting. 09/16/15   Isla Pence, MD  rivaroxaban (XARELTO) 20 MG TABS tablet Take 20 mg by mouth daily. 07/02/14   Historical Provider, MD  sucralfate (CARAFATE) 1 G tablet Take 1 tablet (1 g total) by mouth 4 (four) times daily -  with meals and at bedtime. 12/20/14   Midge Minium, MD  sulfamethoxazole-trimethoprim (BACTRIM DS,SEPTRA DS) 800-160 MG tablet Take 1 tablet by mouth 2 (two) times daily. 09/16/15 09/23/15  Isla Pence, MD  Vitamin D, Ergocalciferol, (DRISDOL) 50000 UNITS CAPS capsule Take 50,000 Units by mouth every 7 (seven) days.    Historical Provider, MD   BP 192/96 mmHg  Pulse 124  Temp(Src) 101.8 F (38.8 C) (Oral)  Resp 22  SpO2 100%   Physical Exam  Constitutional: She is oriented to person, place, and time. She appears well-developed and well-nourished. She appears distressed (Hyperventilation).  HENT:  Head: Normocephalic and atraumatic.  Mouth/Throat: Oropharynx is clear and moist.  Eyes: Conjunctivae are normal. Right eye exhibits no discharge. Left eye exhibits no discharge.  Neck: Normal range of motion. Neck supple. No thyromegaly present.  Cardiovascular: Regular rhythm.  Tachycardia present.   Murmur heard.  Systolic murmur is present  Pulmonary/Chest: Effort normal and breath sounds normal. No accessory muscle usage. Tachypnea noted. No bradypnea. No respiratory distress. She has no decreased breath sounds. She has no wheezes. She has no rhonchi. She has no rales.  Mild tachypnea on arrival. No true Kussmaul respirations  Abdominal: Soft. There is tenderness (mild, generalized). There is no rebound and no guarding.  No focal tenderness.  Neurological: She is alert and oriented to person, place, and time. A sensory deficit  (states tingling/decreased sensation BILATERAL hands) is present. No cranial nerve deficit. She exhibits normal muscle tone. Coordination normal. GCS eye subscore is 4. GCS verbal subscore is 5. GCS motor subscore is 6.  Skin: Skin is warm and dry. No rash noted.  Psychiatric: Her mood appears anxious.  Nursing note and  vitals reviewed.   ED Course  Procedures (including critical care time) Labs Review Labs Reviewed  CBC WITH DIFFERENTIAL/PLATELET - Abnormal; Notable for the following:    Hemoglobin 11.5 (*)    HCT 34.5 (*)    Neutro Abs 7.9 (*)    All other components within normal limits  COMPREHENSIVE METABOLIC PANEL - Abnormal; Notable for the following:    Chloride 96 (*)    Glucose, Bld 339 (*)    Anion gap 20 (*)    All other components within normal limits  URINALYSIS, ROUTINE W REFLEX MICROSCOPIC (NOT AT [facility]) - Abnormal; Notable for the following:    Glucose, UA >1000 (*)    Hgb urine dipstick TRACE (*)    Ketones, ur >80 (*)    All other components within normal limits  URINE MICROSCOPIC-ADD ON - Abnormal; Notable for the following:    Squamous Epithelial / LPF 0-5 (*)    All other components within normal limits  CBG MONITORING, ED - Abnormal; Notable for the following:    Glucose-Capillary 293 (*)    All other components within normal limits  I-STAT CG4 LACTIC ACID, ED - Abnormal; Notable for the following:    Lactic Acid, Venous 6.34 (*)    All other components within normal limits  I-STAT ARTERIAL BLOOD GAS, ED - Abnormal; Notable for the following:    pO2, Arterial 75.0 (*)    Bicarbonate 25.9 (*)    All other components within normal limits  CULTURE, BLOOD (ROUTINE X 2)  URINE CULTURE    Imaging Review Dg Chest 2 View  09/16/2015  CLINICAL DATA:  Shortness of breath.  Central chest pain.  Emesis. EXAM: CHEST  2 VIEW COMPARISON:  03/30/2015 FINDINGS: Mild enlargement of the cardiopericardial silhouette . Mildly low lung volumes. The lungs appear clear. No  pleural effusion. IMPRESSION: 1. Mild enlargement of the cardiopericardial silhouette. No edema. Otherwise negative. Electronically Signed   By: Van Clines M.D.   On: 09/16/2015 23:01   I have personally reviewed and evaluated these images and lab results as part of my medical decision-making.  Patient seen and examined. Work-up initiated. Fluids/antiemetics ordered. Vital signs abnormalities as noted.   Vital signs reviewed and are as follows: BP 192/96 mmHg  Pulse 124  Temp(Src) 101.8 F (38.8 C) (Oral)  Resp 22  SpO2 100%  11:05 PM Patient is more comfortable. Lactate is elevated. Fluids ordered. Will get in and out to recheck for true UTI. CXR pending. Will order abx based on these results. Will need admit. Patient updated. No focal abdominal tenderness.   Patient discussed with and seen by Dr. Florina Ou.   12:40 AM Patient stable. PH is normal. It is about time to recheck lactate.   Sepsis - Repeat Assessment  Performed at:    0030  Vitals     Blood pressure 207/82, pulse 105, temperature 99.4 F (37.4 C), temperature source Oral, resp. rate 21, SpO2 99 %.  Heart:     Tachycardic  Lungs:    CTA  Capillary Refill:   <2 sec  Peripheral Pulse:   Radial pulse palpable  Skin:     Normal Color  12:42 AM Awaiting hospitalist callback.   1:05 AM Spoke with Dr. Blaine Hamper who accepts patient. Will transfer to [facility] stepdown.   Requests CT abd/pelvis -- will perform.   Dr. Florina Ou will monitor while here.    CRITICAL CARE Performed by: Faustino Congress Total critical care time: 45 minutes Critical care time was  exclusive of separately billable procedures and treating other patients. Critical care was necessary to treat or prevent imminent or life-threatening deterioration. Critical care was time spent personally by me on the following activities: development of treatment plan with patient and/or surrogate as well as nursing, discussions with consultants, evaluation of patient's  response to treatment, examination of patient, obtaining history from patient or surrogate, ordering and performing treatments and interventions, ordering and review of laboratory studies, ordering and review of radiographic studies, pulse oximetry and re-evaluation of patient's condition.   MDM   Final diagnoses:  Sepsis, due to unspecified organism ([place])  Hyperglycemia  Essential hypertension   Admit.     Carlisle Cater, PA-C 09/17/15 0105  Shanon Rosser, MD 09/17/15 [id number]  Shanon Rosser, MD 09/17/15 [phone]

## 2015-09-16 NOTE — ED Notes (Signed)
Pt attempting to climb out of bed while trying to start IV, pt unable to keep on stretcher. Pt moved to rm 5 to be monitored closer

## 2015-09-16 NOTE — ED Notes (Signed)
C/o vomiting since 5pm-seen  here for same earlier today-presents to triage in w/c

## 2015-09-16 NOTE — ED Notes (Addendum)
Pt helped to stretcher pt with slurred speech and unable to keep eyes open , unable to complete sentence before falling asleep, during assessment

## 2015-09-16 NOTE — ED Notes (Signed)
LAC panic of 6.34 reported to Dr. Tomi Bamberger at 2256.

## 2015-09-16 NOTE — ED Notes (Signed)
Patient requests assistance to bathroom but  Upon standing c/o dizziness due to medication and request bedside commode.

## 2015-09-16 NOTE — ED Notes (Signed)
Patient transported to X-ray 

## 2015-09-16 NOTE — ED Notes (Signed)
Patient in room, moaning and actively vomiting. Patient given meds as ordered.

## 2015-09-16 NOTE — ED Notes (Signed)
Attempt x 2 for piv start unsuccessful.

## 2015-09-16 NOTE — ED Notes (Signed)
C/o "gastroparesis"-vomiting since 7am-presents to triage in w/c

## 2015-09-17 ENCOUNTER — Encounter (HOSPITAL_COMMUNITY): Payer: Self-pay | Admitting: General Practice

## 2015-09-17 ENCOUNTER — Inpatient Hospital Stay (HOSPITAL_BASED_OUTPATIENT_CLINIC_OR_DEPARTMENT_OTHER): Payer: Medicaid Other

## 2015-09-17 DIAGNOSIS — J45909 Unspecified asthma, uncomplicated: Secondary | ICD-10-CM | POA: Diagnosis present

## 2015-09-17 DIAGNOSIS — B373 Candidiasis of vulva and vagina: Secondary | ICD-10-CM | POA: Diagnosis not present

## 2015-09-17 DIAGNOSIS — E1310 Other specified diabetes mellitus with ketoacidosis without coma: Secondary | ICD-10-CM | POA: Diagnosis not present

## 2015-09-17 DIAGNOSIS — A419 Sepsis, unspecified organism: Secondary | ICD-10-CM | POA: Insufficient documentation

## 2015-09-17 DIAGNOSIS — K3184 Gastroparesis: Secondary | ICD-10-CM | POA: Diagnosis present

## 2015-09-17 DIAGNOSIS — N179 Acute kidney failure, unspecified: Secondary | ICD-10-CM | POA: Diagnosis present

## 2015-09-17 DIAGNOSIS — F419 Anxiety disorder, unspecified: Secondary | ICD-10-CM | POA: Diagnosis present

## 2015-09-17 DIAGNOSIS — D649 Anemia, unspecified: Secondary | ICD-10-CM | POA: Diagnosis present

## 2015-09-17 DIAGNOSIS — E860 Dehydration: Secondary | ICD-10-CM | POA: Diagnosis present

## 2015-09-17 DIAGNOSIS — R402252 Coma scale, best verbal response, oriented, at arrival to emergency department: Secondary | ICD-10-CM | POA: Diagnosis present

## 2015-09-17 DIAGNOSIS — I10 Essential (primary) hypertension: Secondary | ICD-10-CM | POA: Diagnosis present

## 2015-09-17 DIAGNOSIS — R402362 Coma scale, best motor response, obeys commands, at arrival to emergency department: Secondary | ICD-10-CM | POA: Diagnosis present

## 2015-09-17 DIAGNOSIS — G4730 Sleep apnea, unspecified: Secondary | ICD-10-CM | POA: Diagnosis present

## 2015-09-17 DIAGNOSIS — E876 Hypokalemia: Secondary | ICD-10-CM | POA: Diagnosis not present

## 2015-09-17 DIAGNOSIS — I480 Paroxysmal atrial fibrillation: Secondary | ICD-10-CM | POA: Diagnosis present

## 2015-09-17 DIAGNOSIS — E1143 Type 2 diabetes mellitus with diabetic autonomic (poly)neuropathy: Secondary | ICD-10-CM | POA: Diagnosis present

## 2015-09-17 DIAGNOSIS — K219 Gastro-esophageal reflux disease without esophagitis: Secondary | ICD-10-CM | POA: Diagnosis present

## 2015-09-17 DIAGNOSIS — R402142 Coma scale, eyes open, spontaneous, at arrival to emergency department: Secondary | ICD-10-CM | POA: Diagnosis present

## 2015-09-17 DIAGNOSIS — R739 Hyperglycemia, unspecified: Secondary | ICD-10-CM | POA: Diagnosis present

## 2015-09-17 DIAGNOSIS — R6510 Systemic inflammatory response syndrome (SIRS) of non-infectious origin without acute organ dysfunction: Secondary | ICD-10-CM | POA: Diagnosis not present

## 2015-09-17 DIAGNOSIS — I959 Hypotension, unspecified: Secondary | ICD-10-CM | POA: Diagnosis not present

## 2015-09-17 DIAGNOSIS — E0500 Thyrotoxicosis with diffuse goiter without thyrotoxic crisis or storm: Secondary | ICD-10-CM | POA: Diagnosis present

## 2015-09-17 DIAGNOSIS — D332 Benign neoplasm of brain, unspecified: Secondary | ICD-10-CM | POA: Diagnosis present

## 2015-09-17 DIAGNOSIS — F329 Major depressive disorder, single episode, unspecified: Secondary | ICD-10-CM | POA: Diagnosis present

## 2015-09-17 DIAGNOSIS — E785 Hyperlipidemia, unspecified: Secondary | ICD-10-CM | POA: Diagnosis present

## 2015-09-17 DIAGNOSIS — E669 Obesity, unspecified: Secondary | ICD-10-CM | POA: Diagnosis present

## 2015-09-17 DIAGNOSIS — Z794 Long term (current) use of insulin: Secondary | ICD-10-CM | POA: Diagnosis not present

## 2015-09-17 DIAGNOSIS — Z6831 Body mass index (BMI) 31.0-31.9, adult: Secondary | ICD-10-CM | POA: Diagnosis not present

## 2015-09-17 DIAGNOSIS — E1110 Type 2 diabetes mellitus with ketoacidosis without coma: Secondary | ICD-10-CM | POA: Insufficient documentation

## 2015-09-17 LAB — BASIC METABOLIC PANEL
ANION GAP: 13 (ref 5–15)
ANION GAP: 7 (ref 5–15)
ANION GAP: 7 (ref 5–15)
Anion gap: 11 (ref 5–15)
BUN: 8 mg/dL (ref 6–20)
BUN: <5 mg/dL — ABNORMAL LOW (ref 6–20)
BUN: <5 mg/dL — ABNORMAL LOW (ref 6–20)
BUN: <5 mg/dL — ABNORMAL LOW (ref 6–20)
CALCIUM: 8.6 mg/dL — AB (ref 8.9–10.3)
CHLORIDE: 102 mmol/L (ref 101–111)
CO2: 28 mmol/L (ref 22–32)
CO2: 25 mmol/L (ref 22–32)
CO2: 27 mmol/L (ref 22–32)
CO2: 28 mmol/L (ref 22–32)
Calcium: 8.3 mg/dL — ABNORMAL LOW (ref 8.9–10.3)
Calcium: 8.2 mg/dL — ABNORMAL LOW (ref 8.9–10.3)
Calcium: 8.4 mg/dL — ABNORMAL LOW (ref 8.9–10.3)
Chloride: 101 mmol/L (ref 101–111)
Chloride: 102 mmol/L (ref 101–111)
Chloride: 103 mmol/L (ref 101–111)
Creatinine, Ser: 0.73 mg/dL (ref 0.44–1.00)
Creatinine, Ser: 0.73 mg/dL (ref 0.44–1.00)
Creatinine, Ser: 0.62 mg/dL (ref 0.44–1.00)
Creatinine, Ser: 0.63 mg/dL (ref 0.44–1.00)
GFR calc Af Amer: >60 mL/min (ref >60)
GFR calc Af Amer: >60 mL/min (ref >60)
GFR calc Af Amer: >60 mL/min (ref >60)
GFR calc Af Amer: >60 mL/min (ref >60)
GFR calc non Af Amer: >60 mL/min (ref >60)
GFR calc non Af Amer: >60 mL/min (ref >60)
GFR calc non Af Amer: >60 mL/min (ref >60)
GLUCOSE: 141 mg/dL — AB (ref 65–99)
GLUCOSE: 150 mg/dL — AB (ref 65–99)
GLUCOSE: 153 mg/dL — AB (ref 65–99)
GLUCOSE: 130 mg/dL — AB (ref 65–99)
POTASSIUM: 2.8 mmol/L — AB (ref 3.5–5.1)
POTASSIUM: 2.7 mmol/L — AB (ref 3.5–5.1)
POTASSIUM: 2.3 mmol/L — AB (ref 3.5–5.1)
POTASSIUM: 2.9 mmol/L — AB (ref 3.5–5.1)
Sodium: 141 mmol/L (ref 135–145)
Sodium: 139 mmol/L (ref 135–145)
Sodium: 136 mmol/L (ref 135–145)
Sodium: 138 mmol/L (ref 135–145)

## 2015-09-17 LAB — I-STAT ARTERIAL BLOOD GAS, ED
Acid-Base Excess: 2 mmol/L (ref 0.0–2.0)
BICARBONATE: 25.9 meq/L — AB (ref 20.0–24.0)
O2 Saturation: 95 %
TCO2: 27 mmol/L (ref 0–100)
pCO2 arterial: 38.1 mmHg (ref 35.0–45.0)
pH, Arterial: 7.441 (ref 7.350–7.450)
pO2, Arterial: 75 mmHg — ABNORMAL LOW (ref 80.0–100.0)

## 2015-09-17 LAB — GLUCOSE, CAPILLARY
GLUCOSE-CAPILLARY: 129 mg/dL — AB (ref 65–99)
GLUCOSE-CAPILLARY: 221 mg/dL — AB (ref 65–99)
GLUCOSE-CAPILLARY: 149 mg/dL — AB (ref 65–99)
GLUCOSE-CAPILLARY: 132 mg/dL — AB (ref 65–99)
GLUCOSE-CAPILLARY: 114 mg/dL — AB (ref 65–99)
GLUCOSE-CAPILLARY: 159 mg/dL — AB (ref 65–99)
Glucose-Capillary: 191 mg/dL — ABNORMAL HIGH (ref 65–99)
Glucose-Capillary: 230 mg/dL — ABNORMAL HIGH (ref 65–99)
Glucose-Capillary: 192 mg/dL — ABNORMAL HIGH (ref 65–99)
Glucose-Capillary: 205 mg/dL — ABNORMAL HIGH (ref 65–99)
Glucose-Capillary: 198 mg/dL — ABNORMAL HIGH (ref 65–99)
Glucose-Capillary: 151 mg/dL — ABNORMAL HIGH (ref 65–99)
Glucose-Capillary: 138 mg/dL — ABNORMAL HIGH (ref 65–99)
Glucose-Capillary: 145 mg/dL — ABNORMAL HIGH (ref 65–99)

## 2015-09-17 LAB — MRSA PCR SCREENING: MRSA by PCR: NEGATIVE

## 2015-09-17 LAB — CBG MONITORING, ED
GLUCOSE-CAPILLARY: 256 mg/dL — AB (ref 65–99)
GLUCOSE-CAPILLARY: 194 mg/dL — AB (ref 65–99)
Glucose-Capillary: 235 mg/dL — ABNORMAL HIGH (ref 65–99)
Glucose-Capillary: 201 mg/dL — ABNORMAL HIGH (ref 65–99)
Glucose-Capillary: 141 mg/dL — ABNORMAL HIGH (ref 65–99)

## 2015-09-17 LAB — I-STAT CG4 LACTIC ACID, ED: LACTIC ACID, VENOUS: 3.21 mmol/L — AB (ref 0.5–2.0)

## 2015-09-17 LAB — LACTIC ACID, PLASMA: Lactic Acid, Venous: 1.5 mmol/L (ref 0.5–2.0)

## 2015-09-17 LAB — MAGNESIUM: Magnesium: 1.5 mg/dL — ABNORMAL LOW (ref 1.7–2.4)

## 2015-09-17 LAB — PROCALCITONIN: Procalcitonin: <0.1 ng/mL

## 2015-09-17 LAB — PROTIME-INR
INR: 1.11 (ref 0.00–1.49)
Prothrombin Time: 14.5 seconds (ref 11.6–15.2)

## 2015-09-17 LAB — BETA-HYDROXYBUTYRIC ACID: BETA-HYDROXYBUTYRIC ACID: 2.02 mmol/L — AB (ref 0.05–0.27)

## 2015-09-17 LAB — PHOSPHORUS: Phosphorus: 2.5 mg/dL (ref 2.5–4.6)

## 2015-09-17 LAB — APTT: APTT: 25 s (ref 24–37)

## 2015-09-17 LAB — TSH: TSH: 0.245 u[IU]/mL — ABNORMAL LOW (ref 0.350–4.500)

## 2015-09-17 MED ORDER — DICYCLOMINE HCL 20 MG PO TABS
20.0000 mg | ORAL_TABLET | Freq: Two times a day (BID) | ORAL | Status: DC
  Administered 2015-09-17 – 2015-09-20 (×5): 20 mg via ORAL
  Filled 2015-09-17 (×7): qty 1

## 2015-09-17 MED ORDER — DEXTROSE-NACL 5-0.45 % IV SOLN
INTRAVENOUS | Status: DC
  Administered 2015-09-17: 20:00:00 via INTRAVENOUS

## 2015-09-17 MED ORDER — INSULIN ASPART 100 UNIT/ML ~~LOC~~ SOLN
0.0000 [IU] | Freq: Every day | SUBCUTANEOUS | Status: DC
Start: 2015-09-17 — End: 2015-09-20
  Administered 2015-09-18: 2 [IU] via SUBCUTANEOUS
  Administered 2015-09-19: 0 [IU] via SUBCUTANEOUS

## 2015-09-17 MED ORDER — PROMETHAZINE HCL 25 MG PO TABS
25.0000 mg | ORAL_TABLET | Freq: Four times a day (QID) | ORAL | Status: DC | PRN
Start: 2015-09-17 — End: 2015-09-20
  Administered 2015-09-19: 25 mg via ORAL
  Filled 2015-09-17: qty 1

## 2015-09-17 MED ORDER — ENOXAPARIN SODIUM 40 MG/0.4ML ~~LOC~~ SOLN: 40.0000 mg | SUBCUTANEOUS | Status: DC

## 2015-09-17 MED ORDER — PANTOPRAZOLE SODIUM 40 MG PO TBEC: 40.0000 mg | DELAYED_RELEASE_TABLET | Freq: Two times a day (BID) | ORAL | Status: DC

## 2015-09-17 MED ORDER — PIPERACILLIN-TAZOBACTAM 3.375 G IVPB
3.3750 g | Freq: Three times a day (TID) | INTRAVENOUS | Status: DC
  Filled 2015-09-17 (×2): qty 50

## 2015-09-17 MED ORDER — MORPHINE SULFATE (PF) 2 MG/ML IV SOLN
2.0000 mg | INTRAVENOUS | Status: DC | PRN
  Administered 2015-09-17 – 2015-09-19 (×3): 2 mg via INTRAVENOUS
  Filled 2015-09-17 (×3): qty 1

## 2015-09-17 MED ORDER — INSULIN GLARGINE 100 UNIT/ML ~~LOC~~ SOLN
20.0000 [IU] | Freq: Two times a day (BID) | SUBCUTANEOUS | Status: DC
Start: 2015-09-17 — End: 2015-09-20
  Administered 2015-09-17 – 2015-09-20 (×6): 20 [IU] via SUBCUTANEOUS
  Filled 2015-09-17 (×8): qty 0.2

## 2015-09-17 MED ORDER — RIVAROXABAN 20 MG PO TABS: 20.0000 mg | ORAL_TABLET | Freq: Every day | ORAL | Status: DC

## 2015-09-17 MED ORDER — SODIUM CHLORIDE 0.9% FLUSH
3.0000 mL | Freq: Two times a day (BID) | INTRAVENOUS | Status: DC
  Administered 2015-09-17 – 2015-09-18 (×3): 3 mL via INTRAVENOUS

## 2015-09-17 MED ORDER — PIPERACILLIN-TAZOBACTAM 3.375 G IVPB 30 MIN
3.3750 g | Freq: Three times a day (TID) | INTRAVENOUS | Status: DC
  Filled 2015-09-17 (×2): qty 50

## 2015-09-17 MED ORDER — LISINOPRIL 20 MG PO TABS
20.0000 mg | ORAL_TABLET | Freq: Two times a day (BID) | ORAL | Status: DC
  Administered 2015-09-17: 20 mg via ORAL
  Filled 2015-09-17: qty 1

## 2015-09-17 MED ORDER — ATORVASTATIN CALCIUM 80 MG PO TABS
80.0000 mg | ORAL_TABLET | Freq: Every day | ORAL | Status: DC
  Administered 2015-09-18 – 2015-09-20 (×3): 80 mg via ORAL
  Filled 2015-09-17 (×3): qty 1

## 2015-09-17 MED ORDER — POTASSIUM CHLORIDE 10 MEQ/100ML IV SOLN
10.0000 meq | INTRAVENOUS | Status: AC
  Administered 2015-09-17 (×2): 10 meq via INTRAVENOUS
  Filled 2015-09-17 (×2): qty 100

## 2015-09-17 MED ORDER — INSULIN GLARGINE 100 UNIT/ML ~~LOC~~ SOLN
20.0000 [IU] | Freq: Two times a day (BID) | SUBCUTANEOUS | Status: DC
  Filled 2015-09-17: qty 0.2

## 2015-09-17 MED ORDER — POTASSIUM CHLORIDE 10 MEQ/100ML IV SOLN
10.0000 meq | INTRAVENOUS | Status: AC
  Administered 2015-09-17 – 2015-09-18 (×6): 10 meq via INTRAVENOUS
  Filled 2015-09-17 (×5): qty 100

## 2015-09-17 MED ORDER — INSULIN ASPART 100 UNIT/ML ~~LOC~~ SOLN
0.0000 [IU] | Freq: Three times a day (TID) | SUBCUTANEOUS | Status: DC
  Administered 2015-09-18 (×2): 4 [IU] via SUBCUTANEOUS

## 2015-09-17 MED ORDER — INSULIN GLARGINE 100 UNIT/ML ~~LOC~~ SOLN
20.0000 [IU] | Freq: Once | SUBCUTANEOUS | Status: DC
  Filled 2015-09-17: qty 0.2

## 2015-09-17 MED ORDER — SODIUM CHLORIDE 0.9 % IV SOLN
INTRAVENOUS | Status: AC
  Filled 2015-09-17: qty 2.5

## 2015-09-17 MED ORDER — INSULIN ASPART 100 UNIT/ML ~~LOC~~ SOLN
0.0000 [IU] | Freq: Three times a day (TID) | SUBCUTANEOUS | Status: DC
Start: 2015-09-18 — End: 2015-09-17

## 2015-09-17 MED ORDER — DEXTROSE 5 % IV SOLN
1.0000 g | INTRAVENOUS | Status: DC
  Administered 2015-09-17 – 2015-09-18 (×2): 1 g via INTRAVENOUS
  Filled 2015-09-17 (×2): qty 10

## 2015-09-17 MED ORDER — INSULIN ASPART 100 UNIT/ML ~~LOC~~ SOLN: 0.0000 [IU] | Freq: Every day | SUBCUTANEOUS | Status: DC

## 2015-09-17 MED ORDER — SODIUM CHLORIDE 0.9 % IV SOLN
INTRAVENOUS | Status: DC
  Administered 2015-09-18 (×2): via INTRAVENOUS

## 2015-09-17 MED ORDER — METHIMAZOLE 5 MG PO TABS
7.5000 mg | ORAL_TABLET | Freq: Every evening | ORAL | Status: DC
  Administered 2015-09-18 – 2015-09-20 (×2): 7.5 mg via ORAL
  Filled 2015-09-17 (×3): qty 2

## 2015-09-17 MED ORDER — HYDRALAZINE HCL 20 MG/ML IJ SOLN
5.0000 mg | INTRAMUSCULAR | Status: DC | PRN
  Administered 2015-09-17: 5 mg via INTRAVENOUS
  Administered 2015-09-19: 10 mg via INTRAVENOUS
  Filled 2015-09-17 (×2): qty 1

## 2015-09-17 MED ORDER — HYDROCHLOROTHIAZIDE 12.5 MG PO CAPS
12.5000 mg | ORAL_CAPSULE | Freq: Two times a day (BID) | ORAL | Status: DC
  Administered 2015-09-17: 12.5 mg via ORAL
  Filled 2015-09-17: qty 1

## 2015-09-17 MED ORDER — ONDANSETRON HCL 4 MG/2ML IJ SOLN
4.0000 mg | Freq: Three times a day (TID) | INTRAMUSCULAR | Status: DC | PRN
  Administered 2015-09-17 – 2015-09-19 (×2): 4 mg via INTRAVENOUS
  Filled 2015-09-17 (×2): qty 2

## 2015-09-17 MED ORDER — POTASSIUM CHLORIDE CRYS ER 20 MEQ PO TBCR: 40.0000 meq | EXTENDED_RELEASE_TABLET | Freq: Once | ORAL | Status: DC

## 2015-09-17 MED ORDER — ATENOLOL 25 MG PO TABS
100.0000 mg | ORAL_TABLET | Freq: Every evening | ORAL | Status: DC
  Administered 2015-09-18 – 2015-09-20 (×2): 100 mg via ORAL
  Filled 2015-09-17 (×3): qty 4

## 2015-09-17 MED ORDER — IOPAMIDOL (ISOVUE-300) INJECTION 61%
100.0000 mL | Freq: Once | INTRAVENOUS | Status: AC | PRN
  Administered 2015-09-17: 100 mL via INTRAVENOUS

## 2015-09-17 MED ORDER — CLONIDINE HCL 0.1 MG PO TABS
0.1000 mg | ORAL_TABLET | Freq: Two times a day (BID) | ORAL | Status: DC
  Administered 2015-09-17: 0.1 mg via ORAL
  Filled 2015-09-17: qty 1

## 2015-09-17 MED ORDER — VANCOMYCIN HCL IN DEXTROSE 1-5 GM/200ML-% IV SOLN
1000.0000 mg | Freq: Two times a day (BID) | INTRAVENOUS | Status: DC
  Filled 2015-09-17: qty 200

## 2015-09-17 MED ORDER — MAGNESIUM SULFATE 2 GM/50ML IV SOLN
2.0000 g | Freq: Once | INTRAVENOUS | Status: AC
  Administered 2015-09-17: 2 g via INTRAVENOUS
  Filled 2015-09-17: qty 50

## 2015-09-17 MED ORDER — AMLODIPINE BESYLATE 5 MG PO TABS
5.0000 mg | ORAL_TABLET | Freq: Every day | ORAL | Status: DC
  Administered 2015-09-18 – 2015-09-20 (×3): 5 mg via ORAL
  Filled 2015-09-17 (×3): qty 1

## 2015-09-17 MED ORDER — POTASSIUM CHLORIDE CRYS ER 20 MEQ PO TBCR: 40.0000 meq | EXTENDED_RELEASE_TABLET | ORAL | Status: DC | PRN

## 2015-09-17 MED ORDER — LISINOPRIL-HYDROCHLOROTHIAZIDE 20-12.5 MG PO TABS: 1.0000 | ORAL_TABLET | Freq: Two times a day (BID) | ORAL | Status: DC

## 2015-09-17 MED ORDER — SODIUM CHLORIDE 0.9 % IV SOLN: INTRAVENOUS | Status: DC

## 2015-09-17 MED ORDER — ATENOLOL 25 MG PO TABS
25.0000 mg | ORAL_TABLET | Freq: Every day | ORAL | Status: DC | PRN
  Filled 2015-09-17: qty 1

## 2015-09-17 MED ORDER — ONDANSETRON HCL 4 MG/2ML IJ SOLN
4.0000 mg | Freq: Once | INTRAMUSCULAR | Status: AC
  Administered 2015-09-17: 4 mg via INTRAVENOUS
  Filled 2015-09-17: qty 2

## 2015-09-17 MED ORDER — PANTOPRAZOLE SODIUM 40 MG IV SOLR
40.0000 mg | Freq: Two times a day (BID) | INTRAVENOUS | Status: DC
  Administered 2015-09-17 (×2): 40 mg via INTRAVENOUS
  Filled 2015-09-17 (×2): qty 40

## 2015-09-17 MED ORDER — METOCLOPRAMIDE HCL 5 MG/ML IJ SOLN
10.0000 mg | Freq: Three times a day (TID) | INTRAMUSCULAR | Status: DC
  Administered 2015-09-17 – 2015-09-20 (×9): 10 mg via INTRAVENOUS
  Filled 2015-09-17 (×9): qty 2

## 2015-09-17 MED ORDER — ALPRAZOLAM 0.5 MG PO TABS: 0.5000 mg | ORAL_TABLET | Freq: Two times a day (BID) | ORAL | Status: DC | PRN

## 2015-09-17 MED ORDER — LORAZEPAM 2 MG/ML IJ SOLN
1.0000 mg | Freq: Four times a day (QID) | INTRAMUSCULAR | Status: DC | PRN
  Administered 2015-09-19: 1 mg via INTRAVENOUS
  Filled 2015-09-17: qty 1

## 2015-09-17 NOTE — ED Notes (Signed)
LAC panic results reported to Dr. Florina Ou [phone]

## 2015-09-17 NOTE — Progress Notes (Addendum)
Transfer from [facility] per [place] PA.  50 year old lady with past medical history of gastroparesis, hypertension, hyperlipidemia, diabetes mellitus, asthma, GERD, depression, anxiety, cerebral tumor, OSA, atrial fibrillation on Xarelto, chronic abdominal pain, who presents with severe nausea and vomiting.   Due to severe nausea and vomiting, patient was seen earlier today in the emergency department and treated with IV Rocephin for possible UTI, but her UA is negative for UTI today. Pt comes back with fever. She continues to have multiple episodes of vomiting despite using Phenergan that she was discharged with earlier. No URI symptoms or diarrhea per EDP. Pt states that her entire body feels tingly and her face feels numb. Per EDP, patient denies signs of stroke including: no facial droop, slurred speech, aphasia, weakness in extremities and imbalance/trouble walking.  The patient was found to have elevated lactate of 6.34, WBC 6.3, temperature 101.5, tachycardia, tachypnea, elevated blood pressure 207/82, elevated anion gap of 20, blood sugar 339, ABG showed pH of 7.441, PCO2 38.1 and a PO2 of 75, negative urinalysis with positive ketone of 80, negative chest x-ray for acute abnormalities. Patient is septic with unclear source of infection. Patient received fluid resuscitation in ED. She was given IV vancomycin and Zosyn in the emergency room. IV insulin gtt was started in ED. Pending CT abdomen/pelvis. Pt is accepted to SDU as intp.  Ivor Costa, MD  [facility] Pager [phone]  If 7PM-7AM, please contact night-coverage [email or web address] Password [email or web address] 09/17/2015, 1:28 AM

## 2015-09-17 NOTE — ED Notes (Signed)
Patient transported to CT 

## 2015-09-17 NOTE — ED Notes (Signed)
Pt transported via [facility] at this time condition stable, Meagan RN 2C called with update.

## 2015-09-17 NOTE — H&P (Signed)
History and Physical    MAKISHA MESKER [id number] DOB: January 15, 1966 DOA: 09/16/2015  PCP: Guadlupe Spanish, MD Patient coming from: [facility]  Chief Complaint: nausea vomiting  HPI: Crystal Peck is a 50 y.o. female with medical history significant of asthma, DM, HTN, GERD, cerebellar tumor, Afib, thyroid disease, gastroparesis/chronic abd pain.  Nausea, vomiting, abdominal pain started approximately one day ago. Gradual onset. Middle of abdomen. [place] with waxing and waning nature. Sharp. Patient's emesis was nonbloody nonbilious. Emesis described as just fluid and mucus. No diarrhea. Little to no oral intake due to symptoms. Patient attempted to eat once which worsened her pain. Patient does endorse some dysuria and frequency over the last week. Has not tried anything for the symptoms. Subjective fevers reported. States that her sugars have been elevated at home with them being over 500.  ED Course: Patient presented to [facility] [place] for treatment. Patient was labeled as being septic and was started on sepsis protocol with vancomycin Zosyn and IV fluids. Patient was also noted to be in DKA and was started on glucose stabilizer prior to transfer to [facility].  Review of Systems: As per HPI otherwise 10 point review of systems negative.   Ambulatory Status: unrestricted  Past Medical History  Diagnosis Date  . Asthma   . Diabetes mellitus   . Hyperlipidemia   . Hypertension   . GERD (gastroesophageal reflux disease)   . Cerebellar tumor ([place])   . Obesity   . Sleep apnea   . A-fib ([place])   . Thyroid disease   . Chest pain 03/27/2014    negative lexiscan myoview  . Gastroparesis   . Chronic abdominal pain     Past Surgical History  Procedure Laterality Date  . Cholecystectomy    . Partial hysterectomy      ovaries remain  . Nasal sinus surgery      Dr. Lyn Hollingshead  . Abdominal hysterectomy       reports that she has never smoked. She has never used smokeless tobacco. She  reports that she does not drink alcohol or use illicit drugs.  No Known Allergies  Family History  Problem Relation Age of Onset  . Coronary artery disease Father   . Hypertension Father   . Diabetes      maternal grandparents  . Hyperlipidemia Other     Prior to Admission medications   Medication Sig Start Date End Date Taking? Authorizing Provider  albuterol (PROVENTIL HFA;VENTOLIN HFA) 108 (90 BASE) MCG/ACT inhaler Inhale 2 puffs into the lungs every 4 (four) hours as needed for wheezing or shortness of breath.    Historical Provider, MD  ALPRAZolam Duanne Moron) 0.5 MG tablet Take 1 tablet (0.5 mg total) by mouth 2 (two) times daily as needed for anxiety. 10/29/14   Midge Minium, MD  amLODipine (NORVASC) 5 MG tablet Take 5 mg by mouth daily.    Historical Provider, MD  atenolol (TENORMIN) 100 MG tablet Take 1 tablet (100 mg total) by mouth every evening. 10/15/14   Midge Minium, MD  atenolol (TENORMIN) 25 MG tablet Take 1 tablet (25 mg total) by mouth daily as needed. 09/13/14   Sueanne Margarita, MD  atorvastatin (LIPITOR) 80 MG tablet Take 1 tablet (80 mg total) by mouth daily. 11/22/14   Sueanne Margarita, MD  azelastine (OPTIVAR) 0.05 % ophthalmic solution Place 1 drop into both eyes 2 (two) times daily. 10/15/14   Midge Minium, MD  beclomethasone (QVAR) 80 MCG/ACT inhaler  Inhale 3 puffs into the lungs daily. Patient taking differently: Inhale 3 puffs into the lungs daily as needed (wheezing).  10/12/14   Colon Branch, MD  canagliflozin (INVOKANA) 300 MG TABS tablet Take 300 mg by mouth daily before breakfast.    Historical Provider, MD  cetirizine (ZYRTEC) 10 MG tablet Take 1 tablet (10 mg total) by mouth daily. 10/15/14   Midge Minium, MD  cloNIDine (CATAPRES) 0.1 MG tablet Take 1 tablet (0.1 mg total) by mouth 2 (two) times daily. 12/20/14   Midge Minium, MD  dicyclomine (BENTYL) 20 MG tablet Take 1 tablet (20 mg total) by mouth 2 (two) times daily. 12/12/14   Dorie Rank, MD   dicyclomine (BENTYL) 20 MG tablet Take 1 tablet (20 mg total) by mouth 2 (two) times daily. 03/31/15   April Palumbo, MD  escitalopram (LEXAPRO) 10 MG tablet Take 1 tablet (10 mg total) by mouth daily. 10/15/14   Midge Minium, MD  glipiZIDE (GLUCOTROL XL) 10 MG 24 hr tablet Take 10 mg by mouth daily with breakfast.    Historical Provider, MD  glucose blood test strip 1 each by Other route as needed. Onetouch ultra test strip     Historical Provider, MD  insulin aspart (NOVOLOG FLEXPEN) 100 UNIT/ML FlexPen Inject 6 Units into the skin See admin instructions. Per sliding scale 01/30/15   Historical Provider, MD  insulin glargine (LANTUS) 100 UNIT/ML injection Inject 32 Units into the skin every evening.    Historical Provider, MD  IRON PO Take 2 tablets by mouth 3 (three) times a week.     Historical Provider, MD  LANTUS SOLOSTAR 100 UNIT/ML Solostar Pen Inject 20 Units into the skin 4 (four) times daily -  before meals and at bedtime. AS DIRECTED 02/27/14   Historical Provider, MD  lisinopril-hydrochlorothiazide (PRINZIDE,ZESTORETIC) 20-12.5 MG per tablet Take 1 tablet by mouth 2 (two) times daily. 11/26/14   Midge Minium, MD  metFORMIN (GLUCOPHAGE) 1000 MG tablet Take 1,000 mg by mouth 2 (two) times daily with a meal.    Historical Provider, MD  methimazole (TAPAZOLE) 5 MG tablet Take 7.5 mg by mouth every evening.  02/27/14   Historical Provider, MD  metoCLOPramide (REGLAN) 10 MG tablet Take 1 tablet (10 mg total) by mouth every 6 (six) hours as needed for nausea (nausea/headache). 12/11/14   Orpah Greek, MD  metoCLOPramide (REGLAN) 10 MG tablet Take 1 tablet (10 mg total) by mouth every 6 (six) hours. 03/31/15   April Palumbo, MD  metoCLOPramide (REGLAN) 5 MG tablet Take 1 tablet (5 mg total) by mouth 3 (three) times daily before meals. 02/09/15   Thurnell Lose, MD  Multiple Vitamin (MULTIVITAMIN WITH MINERALS) TABS tablet Take 1 tablet by mouth every evening.     Historical  Provider, MD  Omega-3 Fatty Acids (FISH OIL) 1000 MG CAPS Take 2,000 mg by mouth every evening.     Historical Provider, MD  ondansetron (ZOFRAN ODT) 4 MG disintegrating tablet Take 1 tablet (4 mg total) by mouth every 8 (eight) hours as needed for nausea or vomiting. 02/09/15   Thurnell Lose, MD  ondansetron (ZOFRAN ODT) 4 MG disintegrating tablet Take 1 tablet (4 mg total) by mouth every 8 (eight) hours as needed for nausea or vomiting. 03/08/15   Shawn C Joy, PA-C  ondansetron (ZOFRAN ODT) 4 MG disintegrating tablet Take 1 tablet (4 mg total) by mouth every 8 (eight) hours as needed for nausea or vomiting. 08/29/15  Stevi Barrett, PA-C  ondansetron (ZOFRAN ODT) 4 MG disintegrating tablet Take 1 tablet (4 mg total) by mouth every 4 (four) hours as needed for nausea or vomiting. 09/08/15   Charlesetta Shanks, MD  pantoprazole (PROTONIX) 40 MG tablet Take 1 tablet (40 mg total) by mouth daily. Patient taking differently: Take 40 mg by mouth 2 (two) times daily.  06/07/14   Midge Minium, MD  polyethylene glycol [facility] / Floria Raveling) packet Take 17 g by mouth daily. 01/30/15   Historical Provider, MD  potassium chloride (K-DUR) 10 MEQ tablet Take 2 tablets (20 mEq total) by mouth 2 (two) times daily. 02/18/15   Veryl Speak, MD  potassium chloride SA (K-DUR,KLOR-CON) 20 MEQ tablet Take 20 mEq by mouth daily as needed (for leg cramping). Take one tablet by mouth daily. 02/26/12   Midge Minium, MD  promethazine (PHENERGAN) 25 MG tablet Take 1 tablet (25 mg total) by mouth every 6 (six) hours as needed for nausea or vomiting. 09/16/15   Isla Pence, MD  rivaroxaban (XARELTO) 20 MG TABS tablet Take 20 mg by mouth daily. 07/02/14   Historical Provider, MD  sucralfate (CARAFATE) 1 G tablet Take 1 tablet (1 g total) by mouth 4 (four) times daily -  with meals and at bedtime. 12/20/14   Midge Minium, MD  sulfamethoxazole-trimethoprim (BACTRIM DS,SEPTRA DS) 800-160 MG tablet Take 1 tablet by mouth 2  (two) times daily. 09/16/15 09/23/15  Isla Pence, MD  Vitamin D, Ergocalciferol, (DRISDOL) 50000 UNITS CAPS capsule Take 50,000 Units by mouth every 7 (seven) days.    Historical Provider, MD    Physical Exam: Filed Vitals:   09/17/15 0500 09/17/15 0528 09/17/15 0544 09/17/15 0645  BP: 105/51  148/74 153/68  Pulse: 86 90 89 82  Temp:  98.8 F (37.1 C)  98.6 F (37 C)  TempSrc:    Oral  Resp: 24 12 17 15   Height:    5\' 1"  (1.549 m)  Weight:    76.4 kg (168 lb 6.9 oz)  SpO2: 99% 100% 100% 100%      Constitutional: NAD, calm, comfortable Eyes:  PERRL, lids and conjunctivae normal ENMT:  Mucous membranes are moist. Posterior pharynx clear of any exudate or lesions.  Neck:  normal, supple, no masses, no thyromegaly Respiratory:  clear to auscultation bilaterally, no wheezing, no crackles. Normal respiratory effort. No accessory muscle use.  Cardiovascular:  Regular rate and rhythm, no murmurs / rubs / gallops. No extremity edema. 2+ pedal pulses. No carotid bruits.  Abdomen: Normoactive bowel sounds, mild suprapubic and epigastric tenderness to palpation. Nondistended, soft .  Musculoskeletal:  no clubbing / cyanosis. No joint deformity upper and lower extremities. Good ROM, no contractures. Normal muscle tone.  Skin:  no rashes, lesions, ulcers. No induration Neurologic:  CN 2-12 grossly intact. Sensation intact, Strength 5/5 in all 4.  Psychiatric:  Normal judgment and insight. Alert and oriented x 3. Normal mood.    Labs on Admission: I have personally reviewed following labs and imaging studies  CBC:  Recent Labs Lab 09/16/15 1255 09/16/15 2240  WBC 6.6 8.9  NEUTROABS 5.1 7.9*  HGB 11.5* 11.5*  HCT 34.8* 34.5*  MCV 87.2 86.9  PLT 351 [id number]   Basic Metabolic Panel:  Recent Labs Lab 09/16/15 1255 09/16/15 2240  NA 140 139  K 3.0* 3.6  CL 99* 96*  CO2 28 23  GLUCOSE 302* 339*  BUN 17 15  CREATININE 0.85 0.89  CALCIUM 9.5 9.2  GFR: Estimated Creatinine  Clearance: 70.7 mL/min (by C-G formula based on Cr of 0.89). Liver Function Tests:  Recent Labs Lab 09/16/15 1255 09/16/15 2240  AST 22 34  ALT 22 24  ALKPHOS 86 89  BILITOT 0.5 1.0  PROT 7.6 8.0  ALBUMIN 4.0 4.1    Recent Labs Lab 09/16/15 1255  LIPASE 37   No results for input(s): AMMONIA in the last 168 hours. Coagulation Profile: No results for input(s): INR, PROTIME in the last 168 hours. Cardiac Enzymes:  Recent Labs Lab 09/16/15 1235 09/16/15 1405  TROPONINI 0.18* <0.03   BNP (last 3 results)  Recent Labs  11/14/14 1033  PROBNP 19.0   HbA1C: No results for input(s): HGBA1C in the last 72 hours. CBG:  Recent Labs Lab 09/17/15 0206 09/17/15 0326 09/17/15 0430 09/17/15 0538 09/17/15 0654  GLUCAP 235* 201* 194* 141* 129*   Lipid Profile: No results for input(s): CHOL, HDL, LDLCALC, TRIG, CHOLHDL, LDLDIRECT in the last 72 hours. Thyroid Function Tests: No results for input(s): TSH, T4TOTAL, FREET4, T3FREE, THYROIDAB in the last 72 hours. Anemia Panel: No results for input(s): VITAMINB12, FOLATE, FERRITIN, TIBC, IRON, RETICCTPCT in the last 72 hours. Urine analysis:    Component Value Date/Time   COLORURINE YELLOW 09/16/2015 2310   APPEARANCEUR CLEAR 09/16/2015 2310   LABSPEC 1.022 09/16/2015 2310   PHURINE 7.5 09/16/2015 2310   GLUCOSEU >1000* 09/16/2015 2310   HGBUR TRACE* 09/16/2015 2310   BILIRUBINUR NEGATIVE 09/16/2015 2310   KETONESUR >80* 09/16/2015 2310   PROTEINUR NEGATIVE 09/16/2015 2310   UROBILINOGEN 0.2 02/16/2015 1848   NITRITE NEGATIVE 09/16/2015 2310   LEUKOCYTESUR NEGATIVE 09/16/2015 2310    Creatinine Clearance: Estimated Creatinine Clearance: 70.7 mL/min (by C-G formula based on Cr of 0.89).  Sepsis Labs: @LABRCNTIP (procalcitonin:4,lacticidven:4) )No results found for this or any previous visit (from the past 240 hour(s)).   Radiological Exams on Admission: Dg Chest 2 View  09/16/2015  CLINICAL DATA:  Shortness of  breath.  Central chest pain.  Emesis. EXAM: CHEST  2 VIEW COMPARISON:  03/30/2015 FINDINGS: Mild enlargement of the cardiopericardial silhouette . Mildly low lung volumes. The lungs appear clear. No pleural effusion. IMPRESSION: 1. Mild enlargement of the cardiopericardial silhouette. No edema. Otherwise negative. Electronically Signed   By: Van Clines M.D.   On: 09/16/2015 23:01   Ct Abdomen Pelvis W Contrast  09/17/2015  CLINICAL DATA:  Acute onset of diarrhea and abdominal pain. Nausea and vomiting. Sepsis and fever. Initial encounter. EXAM: CT ABDOMEN AND PELVIS WITH CONTRAST TECHNIQUE: Multidetector CT imaging of the abdomen and pelvis was performed using the standard protocol following bolus administration of intravenous contrast. CONTRAST:  145mL ISOVUE-300 IOPAMIDOL (ISOVUE-300) INJECTION 61% COMPARISON:  CT of the abdomen and pelvis performed 08/29/2015 FINDINGS: The visualized lung bases are clear. The liver and spleen are unremarkable in appearance. The patient is status post cholecystectomy, with clips noted at the gallbladder fossa. The pancreas and adrenal glands are unremarkable. Mild scarring is noted at the interpole region of the right kidney. There is no evidence of hydronephrosis. No renal or ureteral stones are seen. No perinephric stranding is appreciated. No free fluid is identified. The small bowel is unremarkable in appearance. The stomach is within normal limits. No acute vascular abnormalities are seen. Minimal calcification are seen along the abdominal aorta. The appendix is not well seen; there is no evidence for appendicitis. The ascending colon is somewhat redundant. The colon is grossly unremarkable in appearance. The bladder is mildly distended and  contains a small amount of air, with a Foley catheter in place. The patient is status post hysterectomy. The ovaries are relatively symmetric. No suspicious adnexal masses are seen. No inguinal lymphadenopathy is seen. No acute  osseous abnormalities are identified. IMPRESSION: 1. No acute abnormality seen within the abdomen or pelvis. 2. Mild scarring at the interpole region of the right kidney. Electronically Signed   By: Garald Balding M.D.   On: 09/17/2015 02:52      Assessment/Plan Active Problems:   Benign neoplasm of brain (HCC)   HLD (hyperlipidemia)   Essential hypertension   GERD   PAF (paroxysmal atrial fibrillation) (HCC)   Graves' disease   Diabetic gastroparesis ([place])   DKA, type 2 (HCC)   SIRS (systemic inflammatory response syndrome) ([place])   DKA: Pt presenting w/ n/v/abd pain. Workup shows glucose 339, anion gap 20, lactic acid 6.34 (downtrending to 3), UA unremarkable, WBC 8.9, ABG 7.44, PCO2 38, PO2 75, Bicarb 25. Initially febrile and tachycardic. Home glucose over past week reported to be >500. Glucose stabilizer initiated at [facility]. Multiple recent diabetes regimen changes lately causing confusion. Diabetic regimen will need to be simplified.  - DKA order set initiated - when able restart Lantus (twice daily)  - Diabetes educator - A1c - Hold Invokona  SIRS: WBC 8.9, lactic acid 6.5 (likely from dehydration and DKA), febrile to 101.9 and tachycardic, no identifyable source, CXR, UA, and Abd CT unremarkable. Initially treated w/ IVF and vanc/zosyn per protocol. Center and [place] drawn. Doubt sepsis. Fever may have been from viral gastro in setting of DKA.  - f/u UCX and BCX - DC vancomycin and zosyn - Ceftriaxone until culture returns - management as above  N/V/Abd pain: suspect gastroparesis given DKA. Pt w/ numerous ED visits and admissions for diabetic gastroparesis over the last 9 months.   - reglan - IVF - clear liquid diet, ADAT - continue home phenergan, Bentyl - DC metformin - consider GI consult if not improving.  HTN: - continue home norvasc, clonidine, Lisinopril, HCTZ  Thyroid dysfunction: h/o Graves - TSH - continue tapazole  GERD: - continue ppi  Afib: sinus.  -  continue Xarelto - continue atenolol  HLD: - continue lipitor  Depression: - continue lexapro  Anxiety: - continue xanax  Cerebellar tumor: benign. No further workup at this point. Managed/monitored by PCP   DVT prophylaxis: Xarelto  Code Status: FULL  Family Communication: none  Disposition Plan: pending improvement and transfer out of stepdown  Consults called: none  Admission status: inptatinet    Darshawn Boateng J MD [facility]  If 7PM-7AM, please contact night-coverage [email or web address] Password [email or web address]  09/17/2015, 7:59 AM

## 2015-09-17 NOTE — Progress Notes (Signed)
  Inpatient Diabetes Program Recommendations  AACE/ADA: New Consensus Statement on Inpatient Glycemic Control (2015)  Target Ranges:  Prepandial:   less than 140 mg/dL      Peak postprandial:   less than 180 mg/dL (1-2 hours)      Critically ill patients:  140 - 180 mg/dL   Review of Glycemic Control:  Results for NIKYLA, PRIEM (MRN [id number]) as of 09/17/2015 15:31  Ref. Range 09/17/2015 10:17 09/17/2015 11:32 09/17/2015 12:38 09/17/2015 14:16 09/17/2015 15:08  Glucose-Capillary Latest Ref Range: 65-99 mg/dL 221 (H) 192 (H) 149 (H) 205 (H) 198 (H)   Diabetes history: Type 2 diabetes Outpatient Diabetes medications: Lantus 40 units daily, Novolog 6 units tid with meals, Invokana 300 mg daily Current orders for Inpatient glycemic control:  IV insulin  Inpatient Diabetes Program Recommendations:    Patient continues to be nauseas and states she has vomited through out the day.   She continues to be on the insulin drip as patient still NPO.   Note that patient was on Invokana prior to admit also- Consider d/c of this medication at discharge due to potential risk for DKA/dehydration.   Note need for simplified insulin regimen.  Will follow.  Consider continuation of insulin drip until nausea/vomitting improved.    Thanks, Adah Perl, RN, BC-ADM Inpatient Diabetes Coordinator Pager [phone] (8a-5p)

## 2015-09-17 NOTE — Progress Notes (Signed)
Pharmacy Antibiotic Note  Crystal Peck is a 50 y.o. female admitted on 09/16/2015 with N/V and fever, possible sepsis.  Pharmacy has been consulted for Vancomycin and Zosyn dosing.  Vancomycin 1 g IV given in ED at  midnight  Plan: Vancomycin 1 g IV q12h Zosyn 3.375 g IV q8h     Temp (24hrs), Avg:99.4 F (37.4 C), Min:98.2 F (36.8 C), Max:101.8 F (38.8 C)   Recent Labs Lab 09/16/15 1255 09/16/15 2240 09/16/15 2248 09/17/15 0149  WBC 6.6 8.9  --   --   CREATININE 0.85 0.89  --   --   LATICACIDVEN  --   --  6.34* 3.21*    Estimated Creatinine Clearance: 68.9 mL/min (by C-G formula based on Cr of 0.89).    No Known Allergies   Crystal Peck 09/17/2015 6:33 AM

## 2015-09-18 DIAGNOSIS — I480 Paroxysmal atrial fibrillation: Secondary | ICD-10-CM

## 2015-09-18 DIAGNOSIS — I10 Essential (primary) hypertension: Secondary | ICD-10-CM

## 2015-09-18 DIAGNOSIS — K219 Gastro-esophageal reflux disease without esophagitis: Secondary | ICD-10-CM

## 2015-09-18 DIAGNOSIS — E1310 Other specified diabetes mellitus with ketoacidosis without coma: Principal | ICD-10-CM

## 2015-09-18 DIAGNOSIS — I952 Hypotension due to drugs: Secondary | ICD-10-CM

## 2015-09-18 DIAGNOSIS — E1143 Type 2 diabetes mellitus with diabetic autonomic (poly)neuropathy: Secondary | ICD-10-CM

## 2015-09-18 DIAGNOSIS — D649 Anemia, unspecified: Secondary | ICD-10-CM

## 2015-09-18 DIAGNOSIS — R6510 Systemic inflammatory response syndrome (SIRS) of non-infectious origin without acute organ dysfunction: Secondary | ICD-10-CM

## 2015-09-18 DIAGNOSIS — K3184 Gastroparesis: Secondary | ICD-10-CM

## 2015-09-18 LAB — GLUCOSE, CAPILLARY
GLUCOSE-CAPILLARY: 175 mg/dL — AB (ref 65–99)
Glucose-Capillary: 189 mg/dL — ABNORMAL HIGH (ref 65–99)
Glucose-Capillary: 249 mg/dL — ABNORMAL HIGH (ref 65–99)
Glucose-Capillary: 214 mg/dL — ABNORMAL HIGH (ref 65–99)

## 2015-09-18 LAB — CBC
HCT: 26.8 % — ABNORMAL LOW (ref 36.0–46.0)
Hemoglobin: 8.5 g/dL — ABNORMAL LOW (ref 12.0–15.0)
MCH: 27.6 pg (ref 26.0–34.0)
MCHC: 31.7 g/dL (ref 30.0–36.0)
MCV: 87 fL (ref 78.0–100.0)
PLATELETS: 270 10*3/uL (ref 150–400)
RBC: 3.08 MIL/uL — AB (ref 3.87–5.11)
RDW: 14.1 % (ref 11.5–15.5)
WBC: 7.4 10*3/uL (ref 4.0–10.5)

## 2015-09-18 LAB — BASIC METABOLIC PANEL
Anion gap: 9 (ref 5–15)
CALCIUM: 7.7 mg/dL — AB (ref 8.9–10.3)
CO2: 23 mmol/L (ref 22–32)
CREATININE: 1.02 mg/dL — AB (ref 0.44–1.00)
Chloride: 106 mmol/L (ref 101–111)
GFR calc Af Amer: >60 mL/min (ref >60)
Glucose, Bld: 160 mg/dL — ABNORMAL HIGH (ref 65–99)
POTASSIUM: 3.3 mmol/L — AB (ref 3.5–5.1)
SODIUM: 138 mmol/L (ref 135–145)

## 2015-09-18 LAB — HEMOGLOBIN AND HEMATOCRIT, BLOOD
HCT: 30.6 % — ABNORMAL LOW (ref 36.0–46.0)
HEMOGLOBIN: 9.7 g/dL — AB (ref 12.0–15.0)

## 2015-09-18 LAB — URINE CULTURE
Culture: NO GROWTH
SPECIAL REQUESTS: NORMAL

## 2015-09-18 LAB — CORTISOL-AM, BLOOD: Cortisol - AM: 12 ug/dL (ref 6.7–22.6)

## 2015-09-18 LAB — HEMOGLOBIN A1C
Hgb A1c MFr Bld: 10.8 % — ABNORMAL HIGH (ref 4.8–5.6)
MEAN PLASMA GLUCOSE: 263 mg/dL

## 2015-09-18 MED ORDER — INSULIN ASPART 100 UNIT/ML ~~LOC~~ SOLN
0.0000 [IU] | Freq: Three times a day (TID) | SUBCUTANEOUS | Status: DC
  Administered 2015-09-18: 3 [IU] via SUBCUTANEOUS
  Administered 2015-09-19: 2 [IU] via SUBCUTANEOUS
  Administered 2015-09-19 – 2015-09-20 (×2): 3 [IU] via SUBCUTANEOUS

## 2015-09-18 MED ORDER — MAGNESIUM SULFATE 2 GM/50ML IV SOLN
2.0000 g | Freq: Once | INTRAVENOUS | Status: AC
  Administered 2015-09-18: 2 g via INTRAVENOUS
  Filled 2015-09-18: qty 50

## 2015-09-18 MED ORDER — PANTOPRAZOLE SODIUM 40 MG PO TBEC
40.0000 mg | DELAYED_RELEASE_TABLET | Freq: Two times a day (BID) | ORAL | Status: DC
  Administered 2015-09-18 – 2015-09-20 (×4): 40 mg via ORAL
  Filled 2015-09-18 (×6): qty 1

## 2015-09-18 MED ORDER — SODIUM CHLORIDE 0.9 % IV BOLUS (SEPSIS)
500.0000 mL | Freq: Once | INTRAVENOUS | Status: AC
  Administered 2015-09-18: 500 mL via INTRAVENOUS

## 2015-09-18 MED ORDER — INSULIN ASPART 100 UNIT/ML ~~LOC~~ SOLN
3.0000 [IU] | Freq: Three times a day (TID) | SUBCUTANEOUS | Status: DC
  Administered 2015-09-18 – 2015-09-20 (×2): 3 [IU] via SUBCUTANEOUS

## 2015-09-18 MED ORDER — SODIUM CHLORIDE 0.45 % IV SOLN
INTRAVENOUS | Status: DC
  Administered 2015-09-18: 08:00:00 via INTRAVENOUS
  Filled 2015-09-18 (×4): qty 1000

## 2015-09-18 NOTE — Progress Notes (Signed)
PROGRESS NOTE  Crystal Peck  [id number] DOB: [birth year] DOA: 09/16/2015 PCP: Guadlupe Spanish, MD  Brief Narrative:   The patient is a 50 year old female with history of asthma, diabetes mellitus type 2, hypertension, GERD, atrial fibrillation, thyroid disease, gastroparesis with chronic abdominal pain. She presented with a one-day history of nausea, vomiting, and abdominal pain which became progressively worse. Her pain was located in the middle of her abdomen and was waxing and waning but sharp in nature. She had nonbloody, nonbilious emesis and was unable to keep liquids down. Eating and drinking worsened her pain. She stated she had some dysuria and increased urinary frequency over the last week and that her blood sugars have been over 500 at home. In the emergency department, she was noted to be in DKA and started on insulin drip. There was concern that she may be septic and was started on the sepsis protocol with vancomycin and Zosyn and IV fluids.  Assessment & Plan:   Active Problems:   Benign neoplasm of brain (HCC)   HLD (hyperlipidemia)   Essential hypertension   GERD   PAF (paroxysmal atrial fibrillation) (HCC)   Graves' disease   Diabetic gastroparesis (HCC)   DKA, type 2 (HCC)   SIRS (systemic inflammatory response syndrome) (HCC)   Elevated lactic acid due to dehydration from vomiting.  DKA less likely given normal bicarb and normal pH.  Resolved.  Hyperglycemia with home glucose over past week reported to be >500. Glucose stabilizer initiated at [facility]. Multiple recent diabetes regimen changes lately causing confusion.  - continue BID lantus for now - Changed to low-dose sliding scale insulin with 3 units standing aspart with meals - Diabetes educator assistance appreciated - A1c 10.8 - Hold Invokona and will not resume at discharge due to risk of dehydration   Sepsis ruled out: WBC 8.9, febrile to 101.9 and tachycardic, no identifyable source, CXR, UA, and Abd CT  unremarkable. Initially treated w/ IVF and vanc/zosyn per protocol. Fever may have been from viral gastro in setting of DKA.  - UCX NGF and [place] NGTD - D/c ceftriaxone  N/V/Abd pain: suspect gastroparesis given DKA. Pt w/ numerous ED visits and admissions for diabetic gastroparesis over the last 9 months. Resolving - Continue reglan and IV fluids - continue home phenergan, Bentyl  HTN, hypotensive overnight - continue home norvasc and atenolol -  Discontinue clonidine, HCTZ, lisinopril  AKI, baseline creatinine 0.62 and currently 1.02, likely due to hypotension overnight -  Repeat BMP In AM -  D/c foley catheter  Normocytic anemia, concerning for hemorrhage given history of upper GIB and rate of decline -  Iron studies, B12, folate -  TSH mildly low (as expected) -  Occult stool -  Repeat hgb this afternoon  Thyroid dysfunction: h/o Graves - TSH 0.245 - continue tapazole  GERD: - continue ppi  Paroxysmal afib: sinus.  - CHADs2vasc = 3, (HTN, DM, F) - holding Xarelto - continue atenolol  Elevated troponin, 0.18 peak on 5/29 likely from dehydration and hyperglycemia, history of exertional chest pains for the last month.  -  F/u with Dr. Radford Pax within 1 week of discharge -  Low risk coronary CTa 11/21/2014  HLD: - continue lipitor  Depression: - continue lexapro  Anxiety: - continue xanax  Cerebellar tumor, benign. No further workup at this point. Managed/monitored by PCP  Hypokalemia, oral potassium supplementation   DVT prophylaxis:  SCDs Code Status: FULL  Family Communication: none  Disposition Plan:  Transfer to telemetry, home  possibly tomorrow if blood sugars well controlled, hemglobin and creatinine stable to improved.     Consultants:   none  Procedures:  none  Antimicrobials:   Ceftriaxone   Vancomycin and zosyn     Subjective: Feeling much better.  Abdominal pain has resolved. Nausea and vomiting have improved. She is tolerating  breakfast this morning without difficulty. She has had some exertional chest pains over the last month.  Objective: Filed Vitals:   09/18/15 0800 09/18/15 0900 09/18/15 1016 09/18/15 1128  BP: 101/61 101/56 107/73 110/51  Pulse: 77 67  64  Temp: 98.5 F (36.9 C)   98.4 F (36.9 C)  TempSrc: Oral   Oral  Resp: 13 17  18   Height:      Weight:      SpO2: 100% 100%  100%    Intake/Output Summary (Last 24 hours) at 09/18/15 1330 Last data filed at 09/18/15 1129  Gross per 24 hour  Intake   3943 ml  Output   2725 ml  Net   1218 ml   Filed Weights   09/17/15 0645  Weight: 76.4 kg (168 lb 6.9 oz)    Examination:  General exam:  Adult female.  No acute distress.  HEENT:  NCAT, MMM Respiratory system: Clear to auscultation bilaterally Cardiovascular system: Regular rate and rhythm, normal S1/S2. No murmurs, rubs, gallops or clicks.  Warm extremities Gastrointestinal system: Normal active bowel sounds, soft, nondistended, nontender. MSK:  Normal tone and bulk, no lower extremity edema Neuro:  Grossly intact    Data Reviewed: I have personally reviewed following labs and imaging studies  CBC:  Recent Labs Lab 09/16/15 1255 09/16/15 2240 09/18/15 0318  WBC 6.6 8.9 7.4  NEUTROABS 5.1 7.9*  --   HGB 11.5* 11.5* 8.5*  HCT 34.8* 34.5* 26.8*  MCV 87.2 86.9 87.0  PLT 351 350 [id number]   Basic Metabolic Panel:  Recent Labs Lab 09/17/15 0722 09/17/15 1232 09/17/15 1634 09/17/15 1952 09/18/15 0318  NA 141 139 136 138 138  K 2.8* 2.7* 2.3* 2.9* 3.3*  CL 102 101 102 103 106  CO2 28 25 27 28 23   GLUCOSE 141* 150* 153* 130* 160*  BUN 8 <5* <5* <5* <5*  CREATININE 0.73 0.73 0.62 0.63 1.02*  CALCIUM 8.3* 8.6* 8.2* 8.4* 7.7*  MG 1.5*  --   --   --   --   PHOS 2.5  --   --   --   --    GFR: Estimated Creatinine Clearance: 61.7 mL/min (by C-G formula based on Cr of 1.02). Liver Function Tests:  Recent Labs Lab 09/16/15 1255 09/16/15 2240  AST 22 34  ALT 22 24    ALKPHOS 86 89  BILITOT 0.5 1.0  PROT 7.6 8.0  ALBUMIN 4.0 4.1    Recent Labs Lab 09/16/15 1255  LIPASE 37   No results for input(s): AMMONIA in the last 168 hours. Coagulation Profile:  Recent Labs Lab 09/17/15 0722  INR 1.11   Cardiac Enzymes:  Recent Labs Lab 09/16/15 1235 09/16/15 1405  TROPONINI 0.18* <0.03   BNP (last 3 results)  Recent Labs  11/14/14 1033  PROBNP 19.0   HbA1C:  Recent Labs  09/17/15 0722  HGBA1C 10.8*   CBG:  Recent Labs Lab 09/17/15 2000 09/17/15 2112 09/17/15 2217 09/18/15 0801 09/18/15 1103  GLUCAP 132* 114* 159* 175* 189*   Lipid Profile: No results for input(s): CHOL, HDL, LDLCALC, TRIG, CHOLHDL, LDLDIRECT in the last 72 hours. Thyroid Function  Tests:  Recent Labs  09/17/15 0722  TSH 0.245*   Anemia Panel: No results for input(s): VITAMINB12, FOLATE, FERRITIN, TIBC, IRON, RETICCTPCT in the last 72 hours. Urine analysis:    Component Value Date/Time   COLORURINE YELLOW 09/16/2015 [place] 09/16/2015 2310   LABSPEC 1.022 09/16/2015 2310   PHURINE 7.5 09/16/2015 2310   GLUCOSEU >1000* 09/16/2015 2310   HGBUR TRACE* 09/16/2015 2310   [place] 09/16/2015 2310   KETONESUR >80* 09/16/2015 2310   PROTEINUR NEGATIVE 09/16/2015 2310   UROBILINOGEN 0.2 02/16/2015 1848   NITRITE NEGATIVE 09/16/2015 2310   LEUKOCYTESUR NEGATIVE 09/16/2015 2310   Sepsis Labs: @LABRCNTIP (procalcitonin:4,lacticidven:4)  ) Recent Results (from the past 240 hour(s))  Urine culture     Status: Abnormal   Collection Time: 09/16/15  1:34 PM  Result Value Ref Range Status   Specimen Description URINE, CLEAN CATCH  Final   Special Requests BACTRM Normal  Final   Culture MULTIPLE SPECIES PRESENT, SUGGEST RECOLLECTION (A)  Final   Report Status 09/18/2015 FINAL  Final  Urine culture     Status: None   Collection Time: 09/16/15 11:10 PM  Result Value Ref Range Status   Specimen Description URINE, CLEAN CATCH   Final   Special Requests NONE  Final   Culture NO GROWTH Performed at [facility]   Final   Report Status 09/18/2015 FINAL  Final  Blood Culture (routine x 2)     Status: None (Preliminary result)   Collection Time: 09/16/15 11:30 PM  Result Value Ref Range Status   Specimen Description BLOOD ARM RIGHT  Final   Special Requests IN PEDIATRIC BOTTLE 3cc  Final   Culture   Final    NO GROWTH 1 DAY Performed at [facility]    Report Status PENDING  Incomplete  MRSA PCR Screening     Status: None   Collection Time: 09/17/15  6:59 AM  Result Value Ref Range Status   MRSA by PCR NEGATIVE NEGATIVE Final    Comment:        The GeneXpert MRSA Assay (FDA approved for NASAL specimens only), is one component of a comprehensive MRSA colonization surveillance program. It is not intended to diagnose MRSA infection nor to guide or monitor treatment for MRSA infections.       Radiology Studies: Dg Chest 2 View  09/16/2015  CLINICAL DATA:  Shortness of breath.  Central chest pain.  Emesis. EXAM: CHEST  2 VIEW COMPARISON:  03/30/2015 FINDINGS: Mild enlargement of the cardiopericardial silhouette . Mildly low lung volumes. The lungs appear clear. No pleural effusion. IMPRESSION: 1. Mild enlargement of the cardiopericardial silhouette. No edema. Otherwise negative. Electronically Signed   By: Van Clines M.D.   On: 09/16/2015 23:01   Ct Abdomen Pelvis W Contrast  09/17/2015  CLINICAL DATA:  Acute onset of diarrhea and abdominal pain. Nausea and vomiting. Sepsis and fever. Initial encounter. EXAM: CT ABDOMEN AND PELVIS WITH CONTRAST TECHNIQUE: Multidetector CT imaging of the abdomen and pelvis was performed using the standard protocol following bolus administration of intravenous contrast. CONTRAST:  129mL ISOVUE-300 IOPAMIDOL (ISOVUE-300) INJECTION 61% COMPARISON:  CT of the abdomen and pelvis performed 08/29/2015 FINDINGS: The visualized lung bases are clear. The liver and  spleen are unremarkable in appearance. The patient is status post cholecystectomy, with clips noted at the gallbladder fossa. The pancreas and adrenal glands are unremarkable. Mild scarring is noted at the interpole region of the right kidney. There is no evidence  of hydronephrosis. No renal or ureteral stones are seen. No perinephric stranding is appreciated. No free fluid is identified. The small bowel is unremarkable in appearance. The stomach is within normal limits. No acute vascular abnormalities are seen. Minimal calcification are seen along the abdominal aorta. The appendix is not well seen; there is no evidence for appendicitis. The ascending colon is somewhat redundant. The colon is grossly unremarkable in appearance. The bladder is mildly distended and contains a small amount of air, with a Foley catheter in place. The patient is status post hysterectomy. The ovaries are relatively symmetric. No suspicious adnexal masses are seen. No inguinal lymphadenopathy is seen. No acute osseous abnormalities are identified. IMPRESSION: 1. No acute abnormality seen within the abdomen or pelvis. 2. Mild scarring at the interpole region of the right kidney. Electronically Signed   By: Garald Balding M.D.   On: 09/17/2015 02:52     Scheduled Meds: . amLODipine  5 mg Oral Daily  . atenolol  100 mg Oral QPM  . atorvastatin  80 mg Oral Daily  . cefTRIAXone (ROCEPHIN)  IV  1 g Intravenous Q24H  . dicyclomine  20 mg Oral BID  . insulin aspart  0-20 Units Subcutaneous TID WC  . insulin aspart  0-5 Units Subcutaneous QHS  . insulin glargine  20 Units Subcutaneous BID  . methimazole  7.5 mg Oral QPM  . metoCLOPramide (REGLAN) injection  10 mg Intravenous Q8H  . pantoprazole  40 mg Oral BID AC  . sodium chloride flush  3 mL Intravenous Q12H   Continuous Infusions: . sodium chloride 0.45 % with kcl 75 mL/hr at 09/18/15 0805     LOS: 1 day    Time spent: 30 min    Janece Canterbury, MD [facility] Pager [phone]  If 7PM-7AM, please contact night-coverage [email or web address] Password [email or web address] 09/18/2015, 1:30 PM

## 2015-09-18 NOTE — Progress Notes (Signed)
Inpatient Diabetes Program Recommendations  AACE/ADA: New Consensus Statement on Inpatient Glycemic Control (2015)  Target Ranges:  Prepandial:   less than 140 mg/dL      Peak postprandial:   less than 180 mg/dL (1-2 hours)      Critically ill patients:  140 - 180 mg/dL   Review of Glycemic Control:  Results for Crystal Peck, Crystal Peck (MRN [id number]) as of 09/18/2015 13:05  Ref. Range 09/17/2015 20:00 09/17/2015 21:12 09/17/2015 22:17 09/18/2015 08:01 09/18/2015 11:03  Glucose-Capillary Latest Ref Range: 65-99 mg/dL 132 (H) 114 (H) 159 (H) 175 (H) 189 (H)   Diabetes history: Type 2 diabetes Outpatient Diabetes medications: Lantus 40 units daily, Novolog 6 units tid with meals, Invokana 300 mg daily Current orders for Inpatient glycemic control:  Lantus 20 units bid, Novolog resistant tid with meals and HS  Inpatient Diabetes Program Recommendations:    May consider reducing Novolog correction to sensitive tid with meals.  Also consider adding Novolog meal coverage 3 units tid with meals. Please consider not resuming Invokana at discharge due to risk of DKA/dehydration.  Thanks, Adah Perl, RN, BC-ADM Inpatient Diabetes Coordinator Pager [phone] (8a-5p)

## 2015-09-18 NOTE — Progress Notes (Signed)
Report given to Clarene Critchley, RN on 2W. Patient transferred via bed with belongings- including clothing; to 2W17. CCMD notified of transfer as patient will be on telemetry on 2W but will be transferred off. Chart and Medications sent with patient.  Milford Cage, RN

## 2015-09-19 LAB — COMPREHENSIVE METABOLIC PANEL
ALBUMIN: 2.4 g/dL — AB (ref 3.5–5.0)
ALK PHOS: 60 U/L (ref 38–126)
ALT: 17 U/L (ref 14–54)
AST: 17 U/L (ref 15–41)
Anion gap: 6 (ref 5–15)
BILIRUBIN TOTAL: 0.5 mg/dL (ref 0.3–1.2)
BUN: 19 mg/dL (ref 6–20)
CALCIUM: 8.3 mg/dL — AB (ref 8.9–10.3)
CO2: 25 mmol/L (ref 22–32)
CREATININE: 1.03 mg/dL — AB (ref 0.44–1.00)
Chloride: 109 mmol/L (ref 101–111)
GFR calc Af Amer: >60 mL/min (ref >60)
GFR calc non Af Amer: >60 mL/min (ref >60)
GLUCOSE: 217 mg/dL — AB (ref 65–99)
Potassium: 3.5 mmol/L (ref 3.5–5.1)
SODIUM: 140 mmol/L (ref 135–145)
Total Protein: 5 g/dL — ABNORMAL LOW (ref 6.5–8.1)

## 2015-09-19 LAB — CBC
HEMATOCRIT: 28 % — AB (ref 36.0–46.0)
Hemoglobin: 8.7 g/dL — ABNORMAL LOW (ref 12.0–15.0)
MCH: 27.2 pg (ref 26.0–34.0)
MCHC: 31.1 g/dL (ref 30.0–36.0)
MCV: 87.5 fL (ref 78.0–100.0)
Platelets: 278 10*3/uL (ref 150–400)
RBC: 3.2 MIL/uL — ABNORMAL LOW (ref 3.87–5.11)
RDW: 13.5 % (ref 11.5–15.5)
WBC: 6.3 10*3/uL (ref 4.0–10.5)

## 2015-09-19 LAB — GLUCOSE, CAPILLARY
GLUCOSE-CAPILLARY: 182 mg/dL — AB (ref 65–99)
GLUCOSE-CAPILLARY: 214 mg/dL — AB (ref 65–99)
GLUCOSE-CAPILLARY: 214 mg/dL — AB (ref 65–99)
Glucose-Capillary: 197 mg/dL — ABNORMAL HIGH (ref 65–99)

## 2015-09-19 LAB — IRON AND TIBC
Iron: 61 ug/dL (ref 28–170)
SATURATION RATIOS: 24 % (ref 10.4–31.8)
TIBC: 256 ug/dL (ref 250–450)
UIBC: 195 ug/dL

## 2015-09-19 LAB — OCCULT BLOOD X 1 CARD TO LAB, STOOL: FECAL OCCULT BLD: NEGATIVE

## 2015-09-19 LAB — FERRITIN: Ferritin: 328 ng/mL — ABNORMAL HIGH (ref 11–307)

## 2015-09-19 LAB — VITAMIN B12: VITAMIN B 12: 382 pg/mL (ref 180–914)

## 2015-09-19 LAB — FOLATE: FOLATE: 5.3 ng/mL — AB (ref >5.9)

## 2015-09-19 MED ORDER — RIVAROXABAN 20 MG PO TABS
20.0000 mg | ORAL_TABLET | Freq: Every day | ORAL | Status: DC
  Administered 2015-09-20: 20 mg via ORAL
  Filled 2015-09-19 (×2): qty 1

## 2015-09-19 MED ORDER — SODIUM CHLORIDE 0.9 % IV SOLN
INTRAVENOUS | Status: DC
  Administered 2015-09-19: 1 mL via INTRAVENOUS

## 2015-09-19 MED ORDER — BISACODYL 10 MG RE SUPP
10.0000 mg | Freq: Once | RECTAL | Status: AC
  Administered 2015-09-19: 10 mg via RECTAL
  Filled 2015-09-19: qty 1

## 2015-09-19 MED ORDER — FOLIC ACID 1 MG PO TABS
1.0000 mg | ORAL_TABLET | Freq: Every day | ORAL | Status: DC
  Administered 2015-09-19 – 2015-09-20 (×2): 1 mg via ORAL
  Filled 2015-09-19 (×2): qty 1

## 2015-09-19 MED ORDER — POLYETHYLENE GLYCOL 3350 17 G PO PACK: 17.0000 g | PACK | Freq: Every day | ORAL | Status: DC

## 2015-09-19 MED ORDER — SODIUM CHLORIDE 0.9 % IV SOLN
8.0000 mg | Freq: Once | INTRAVENOUS | Status: AC
  Administered 2015-09-19: 8 mg via INTRAVENOUS
  Filled 2015-09-19: qty 4

## 2015-09-19 MED ORDER — FLUCONAZOLE 100 MG PO TABS
150.0000 mg | ORAL_TABLET | Freq: Once | ORAL | Status: AC
  Administered 2015-09-19: 150 mg via ORAL
  Filled 2015-09-19: qty 2

## 2015-09-19 NOTE — Progress Notes (Signed)
ANTICOAGULATION CONSULT NOTE - Follow Up Consult  Pharmacy Consult for Xarelto Indication: atrial fibrillation  No Known Allergies  Patient Measurements: Height: 5\' 1"  (154.9 cm) Weight: 168 lb 6.9 oz (76.4 kg) IBW/kg (Calculated) : 47.8  Vital Signs: Temp: 98.8 F (37.1 C) (06/01 0624) Temp Source: Oral (06/01 0624) BP: 136/51 mmHg (06/01 1000) Pulse Rate: 65 (06/01 0624)  Labs:  Recent Labs  09/16/15 1235  09/16/15 1405 09/16/15 2240 09/17/15 0722  09/17/15 1952 09/18/15 0318 09/18/15 1405 09/19/15 0320  HGB  --   < >  --  11.5*  --   --   --  8.5* 9.7* 8.7*  HCT  --   < >  --  34.5*  --   --   --  26.8* 30.6* 28.0*  PLT  --   < >  --  350  --   --   --  270  --  278  APTT  --   --   --   --  25  --   --   --   --   --   LABPROT  --   --   --   --  14.5  --   --   --   --   --   INR  --   --   --   --  1.11  --   --   --   --   --   CREATININE  --   < >  --  0.89 0.73  < > 0.63 1.02*  --  1.03*  TROPONINI 0.18*  --  <0.03  --   --   --   --   --   --   --   < > = values in this interval not displayed.  Estimated Creatinine Clearance: 61.1 mL/min (by C-G formula based on Cr of 1.03).   Assessment: 50 year old female admitted with DKA. She is on chronic anticoagulation with Xarelto and this was held on admission. She is to restart Xarelto today. Hemoglobin is stable, no bleeding noted. Calculated CrCl is > 50 ml/min so no dose adjustment is required.  Plan:  Xarelto 20mg  PO daily with the evening meal Monitor for signs and symptoms of bleeding  Legrand Como, Pharm.D., BCPS, AAHIVP Clinical Pharmacist Phone: [phone] or [phone] 09/19/2015, 11:42 AM

## 2015-09-19 NOTE — Progress Notes (Addendum)
PROGRESS NOTE  Crystal Peck  [id number] DOB: Sep 15, 1965 DOA: 09/16/2015 PCP: Guadlupe Spanish, MD  Brief Narrative:   The patient is a 50 year old female with history of asthma, diabetes mellitus type 2, hypertension, GERD, atrial fibrillation, thyroid disease, gastroparesis with chronic abdominal pain. She presented with a one-day history of nausea, vomiting, and abdominal pain which became progressively worse. Her pain was located in the middle of her abdomen and was waxing and waning but sharp in nature. She had nonbloody, nonbilious emesis and was unable to keep liquids down. Eating and drinking worsened her pain. She stated she had some dysuria and increased urinary frequency over the last week and that her blood sugars have been over 500 at home. In the emergency department, she was noted to be in DKA and started on insulin drip. There was concern that she may be septic and was started on the sepsis protocol with vancomycin and Zosyn and IV fluids.  Assessment & Plan:   Active Problems:   Benign neoplasm of brain (HCC)   HLD (hyperlipidemia)   Essential hypertension   GERD   PAF (paroxysmal atrial fibrillation) (HCC)   Graves' disease   Diabetic gastroparesis (HCC)   DKA, type 2 (HCC)   SIRS (systemic inflammatory response syndrome) (HCC)   Elevated lactic acid due to dehydration from vomiting.  DKA less likely given normal bicarb and normal pH.  Resolved.  Hyperglycemia with home glucose over past week reported to be >500. Glucose stabilizer initiated at [facility]. Multiple recent diabetes regimen changes lately causing confusion.  - continue BID lantus for now - Continue low-dose sliding scale insulin with 3 units standing aspart with meals - Diabetes educator assistance appreciated - A1c 10.8 - Hold Invokona and will not resume at discharge due to risk of dehydration   Sepsis ruled out: WBC 8.9, febrile to 101.9 and tachycardic, no identifyable source, CXR, UA, and Abd CT  unremarkable. Initially treated w/ IVF and vanc/zosyn per protocol. Fever may have been from viral gastro in setting of DKA.  - UCX NGF and [place] NGTD  N/V/Abd pain: suspect gastroparesis given DKA. Pt w/ numerous ED visits and admissions for diabetic gastroparesis over the last 9 months. no BM in several days either.  Pain recurred some last night. - Continue reglan and IV fluids - continue home phenergan, Bentyl - continue PPI - start bisacodyl and miralax   HTN, BP low normal - continue home norvasc and atenolol -  Continue to hold clonidine, HCTZ, lisinopril  AKI, baseline creatinine 0.62 and currently 1.03, likely due to hypotension and dehydration.  Urine appears dark this morning -  Repeat BMP In AM -  Continue IVF and encouraged PO fluids, only drank a cup of water yesterday  Normocytic anemia, concerning for hemorrhage given history of upper GIB and rate of decline -  Iron studies and B12 wnl.  Folate mildly low 5.4.  -  Start folate supplementation -  TSH mildly low (as expected) -  Occult stool negative -  hgb approximately stable  Thyroid dysfunction: h/o Graves - TSH 0.245 - continue tapazole  GERD: - continue ppi  Paroxysmal afib: sinus.  - CHADs2vasc = 3, (HTN, DM, F) - resume Xarelto - continue atenolol  Elevated troponin, 0.18 peak on 5/29 likely from dehydration and hyperglycemia, history of exertional chest pains for the last month.  -  F/u with Dr. Radford Pax within 1 week of discharge -  Low risk coronary CTa 11/21/2014  HLD: - continue lipitor  Depression: - continue lexapro  Anxiety: - continue xanax  Cerebellar tumor, benign. No further workup at this point. Managed/monitored by PCP  Hypokalemia, oral potassium supplementation  Vaginal itching, likely candidal vulvovaginitis from hyperglycemia and antibiotics -  Diflucan x 1   DVT prophylaxis:  xarelto Code Status: FULL  Family Communication: none  Disposition Plan:  Home tomorrow if  creatinine improved and tolerating PO   Consultants:   none  Procedures:  none  Antimicrobials:   Ceftriaxone 5/30 > 5/31  Vancomycin 5/30 > 5/31  Zosyn 5/29   Subjective: Feeling much better but had some recurrent abdominal pain this morning.  Ate a little but did not drink much last night.   Objective: Filed Vitals:   09/18/15 1128 09/18/15 2151 09/19/15 0624 09/19/15 1000  BP: 110/51 124/64 140/72 136/51  Pulse: 64 58 65   Temp: 98.4 F (36.9 C) 98.8 F (37.1 C) 98.8 F (37.1 C)   TempSrc: Oral Oral Oral   Resp: 18 18    Height:      Weight:      SpO2: 100% 100% 100%     Intake/Output Summary (Last 24 hours) at 09/19/15 1042 Last data filed at 09/18/15 1129  Gross per 24 hour  Intake 446.25 ml  Output      0 ml  Net 446.25 ml   Filed Weights   09/17/15 0645  Weight: 76.4 kg (168 lb 6.9 oz)    Examination:  General exam:  Adult female.  No acute distress.  HEENT:  NCAT, MMM Respiratory system: Clear to auscultation bilaterally Cardiovascular system: Regular rate and rhythm, normal S1/S2. No murmurs, rubs, gallops or clicks.  Warm extremities Gastrointestinal system: Normal active bowel sounds, soft, nondistended, mildly TTP in the LUQ without rebound or guarding.   MSK:  Normal tone and bulk, no lower extremity edema Neuro:  Grossly intact    Data Reviewed: I have personally reviewed following labs and imaging studies  CBC:  Recent Labs Lab 09/16/15 1255 09/16/15 2240 09/18/15 0318 09/18/15 1405 09/19/15 0320  WBC 6.6 8.9 7.4  --  6.3  NEUTROABS 5.1 7.9*  --   --   --   HGB 11.5* 11.5* 8.5* 9.7* 8.7*  HCT 34.8* 34.5* 26.8* 30.6* 28.0*  MCV 87.2 86.9 87.0  --  87.5  PLT 351 350 270  --  0000000   Basic Metabolic Panel:  Recent Labs Lab 09/17/15 0722 09/17/15 1232 09/17/15 1634 09/17/15 1952 09/18/15 0318 09/19/15 0320  NA 141 139 136 138 138 140  K 2.8* 2.7* 2.3* 2.9* 3.3* 3.5  CL 102 101 102 103 106 109  CO2 28 25 27 28 23 25    GLUCOSE 141* 150* 153* 130* 160* 217*  BUN 8 <5* <5* <5* <5* 19  CREATININE 0.73 0.73 0.62 0.63 1.02* 1.03*  CALCIUM 8.3* 8.6* 8.2* 8.4* 7.7* 8.3*  MG 1.5*  --   --   --   --   --   PHOS 2.5  --   --   --   --   --    GFR: Estimated Creatinine Clearance: 61.1 mL/min (by C-G formula based on Cr of 1.03). Liver Function Tests:  Recent Labs Lab 09/16/15 1255 09/16/15 2240 09/19/15 0320  AST 22 34 17  ALT 22 24 17   ALKPHOS 86 89 60  BILITOT 0.5 1.0 0.5  PROT 7.6 8.0 5.0*  ALBUMIN 4.0 4.1 2.4*    Recent Labs Lab 09/16/15 1255  LIPASE 37   No  results for input(s): AMMONIA in the last 168 hours. Coagulation Profile:  Recent Labs Lab 09/17/15 0722  INR 1.11   Cardiac Enzymes:  Recent Labs Lab 09/16/15 1235 09/16/15 1405  TROPONINI 0.18* <0.03   BNP (last 3 results)  Recent Labs  11/14/14 1033  PROBNP 19.0   HbA1C:  Recent Labs  09/17/15 0722  HGBA1C 10.8*   CBG:  Recent Labs Lab 09/18/15 0801 09/18/15 1103 09/18/15 1637 09/18/15 2149 09/19/15 0614  GLUCAP 175* 189* 249* 214* 182*   Lipid Profile: No results for input(s): CHOL, HDL, LDLCALC, TRIG, CHOLHDL, LDLDIRECT in the last 72 hours. Thyroid Function Tests:  Recent Labs  09/17/15 0722  TSH 0.245*   Anemia Panel:  Recent Labs  09/19/15 0320  VITAMINB12 382  FOLATE 5.3*  FERRITIN 328*  TIBC 256  IRON 61   Urine analysis:    Component Value Date/Time   COLORURINE YELLOW 09/16/2015 2310   APPEARANCEUR CLEAR 09/16/2015 2310   LABSPEC 1.022 09/16/2015 2310   PHURINE 7.5 09/16/2015 2310   GLUCOSEU >1000* 09/16/2015 2310   HGBUR TRACE* 09/16/2015 2310   [place] 09/16/2015 2310   KETONESUR >80* 09/16/2015 2310   PROTEINUR NEGATIVE 09/16/2015 2310   UROBILINOGEN 0.2 02/16/2015 1848   NITRITE NEGATIVE 09/16/2015 2310   LEUKOCYTESUR NEGATIVE 09/16/2015 2310   Sepsis Labs: @LABRCNTIP (procalcitonin:4,lacticidven:4)  ) Recent Results (from the past 240 hour(s))    Urine culture     Status: Abnormal   Collection Time: 09/16/15  1:34 PM  Result Value Ref Range Status   Specimen Description URINE, CLEAN CATCH  Final   Special Requests BACTRM Normal  Final   Culture MULTIPLE SPECIES PRESENT, SUGGEST RECOLLECTION (A)  Final   Report Status 09/18/2015 FINAL  Final  Urine culture     Status: None   Collection Time: 09/16/15 11:10 PM  Result Value Ref Range Status   Specimen Description URINE, CLEAN CATCH  Final   Special Requests NONE  Final   Culture NO GROWTH Performed at [facility]   Final   Report Status 09/18/2015 FINAL  Final  Blood Culture (routine x 2)     Status: None (Preliminary result)   Collection Time: 09/16/15 11:30 PM  Result Value Ref Range Status   Specimen Description BLOOD ARM RIGHT  Final   Special Requests IN PEDIATRIC BOTTLE 3cc  Final   Culture   Final    NO GROWTH 1 DAY Performed at [facility]    Report Status PENDING  Incomplete  MRSA PCR Screening     Status: None   Collection Time: 09/17/15  6:59 AM  Result Value Ref Range Status   MRSA by PCR NEGATIVE NEGATIVE Final    Comment:        The GeneXpert MRSA Assay (FDA approved for NASAL specimens only), is one component of a comprehensive MRSA colonization surveillance program. It is not intended to diagnose MRSA infection nor to guide or monitor treatment for MRSA infections.       Radiology Studies: No results found.   Scheduled Meds: . amLODipine  5 mg Oral Daily  . atenolol  100 mg Oral QPM  . atorvastatin  80 mg Oral Daily  . dicyclomine  20 mg Oral BID  . folic acid  1 mg Oral Daily  . insulin aspart  0-5 Units Subcutaneous QHS  . insulin aspart  0-9 Units Subcutaneous TID WC  . insulin aspart  3 Units Subcutaneous TID WC  . insulin glargine  20 Units Subcutaneous BID  . methimazole  7.5 mg Oral QPM  . metoCLOPramide (REGLAN) injection  10 mg Intravenous Q8H  . pantoprazole  40 mg Oral BID AC  . polyethylene glycol  17 g  Oral Daily  . sodium chloride flush  3 mL Intravenous Q12H   Continuous Infusions: . sodium chloride 1 mL (09/19/15 0815)     LOS: 2 days    Time spent: 30 min    Janece Canterbury, MD [facility] Pager [phone]  If 7PM-7AM, please contact night-coverage [email or web address] Password [facility] 09/19/2015, 10:42 AM

## 2015-09-20 DIAGNOSIS — E785 Hyperlipidemia, unspecified: Secondary | ICD-10-CM

## 2015-09-20 DIAGNOSIS — E0500 Thyrotoxicosis with diffuse goiter without thyrotoxic crisis or storm: Secondary | ICD-10-CM

## 2015-09-20 DIAGNOSIS — E876 Hypokalemia: Secondary | ICD-10-CM

## 2015-09-20 DIAGNOSIS — D332 Benign neoplasm of brain, unspecified: Secondary | ICD-10-CM

## 2015-09-20 LAB — BASIC METABOLIC PANEL
ANION GAP: 9 (ref 5–15)
Anion gap: 6 (ref 5–15)
BUN: 10 mg/dL (ref 6–20)
BUN: 11 mg/dL (ref 6–20)
CALCIUM: 8.4 mg/dL — AB (ref 8.9–10.3)
CALCIUM: 9 mg/dL (ref 8.9–10.3)
CHLORIDE: 105 mmol/L (ref 101–111)
CO2: 25 mmol/L (ref 22–32)
CO2: 25 mmol/L (ref 22–32)
CREATININE: 0.84 mg/dL (ref 0.44–1.00)
Chloride: 104 mmol/L (ref 101–111)
Creatinine, Ser: 0.76 mg/dL (ref 0.44–1.00)
GFR calc Af Amer: >60 mL/min (ref >60)
GFR calc Af Amer: >60 mL/min (ref >60)
GFR calc non Af Amer: >60 mL/min (ref >60)
GLUCOSE: 127 mg/dL — AB (ref 65–99)
GLUCOSE: 213 mg/dL — AB (ref 65–99)
Potassium: 2.7 mmol/L — CL (ref 3.5–5.1)
Potassium: 4 mmol/L (ref 3.5–5.1)
SODIUM: 138 mmol/L (ref 135–145)
Sodium: 136 mmol/L (ref 135–145)

## 2015-09-20 LAB — GLUCOSE, CAPILLARY
GLUCOSE-CAPILLARY: 90 mg/dL (ref 65–99)
GLUCOSE-CAPILLARY: 116 mg/dL — AB (ref 65–99)
Glucose-Capillary: 212 mg/dL — ABNORMAL HIGH (ref 65–99)

## 2015-09-20 MED ORDER — AMLODIPINE BESYLATE 10 MG PO TABS: 10.0000 mg | ORAL_TABLET | Freq: Every day | ORAL | Status: AC

## 2015-09-20 MED ORDER — POTASSIUM CHLORIDE CRYS ER 20 MEQ PO TBCR
40.0000 meq | EXTENDED_RELEASE_TABLET | Freq: Once | ORAL | Status: AC
  Administered 2015-09-20: 40 meq via ORAL
  Filled 2015-09-20: qty 2

## 2015-09-20 MED ORDER — METHIMAZOLE 5 MG PO TABS: 7.5000 mg | ORAL_TABLET | Freq: Every evening | ORAL | Status: AC

## 2015-09-20 MED ORDER — OMEPRAZOLE 20 MG PO CPDR: 20.0000 mg | DELAYED_RELEASE_CAPSULE | Freq: Every day | ORAL | Status: AC

## 2015-09-20 MED ORDER — POTASSIUM CHLORIDE CRYS ER 20 MEQ PO TBCR
20.0000 meq | EXTENDED_RELEASE_TABLET | Freq: Every day | ORAL | Status: DC
Start: 2015-09-20 — End: 2016-02-04

## 2015-09-20 MED ORDER — INSULIN GLARGINE 100 UNIT/ML SOLOSTAR PEN: 20.0000 [IU] | PEN_INJECTOR | Freq: Two times a day (BID) | SUBCUTANEOUS | Status: AC

## 2015-09-20 MED ORDER — POTASSIUM CHLORIDE 10 MEQ/100ML IV SOLN
10.0000 meq | INTRAVENOUS | Status: AC
  Administered 2015-09-20 (×4): 10 meq via INTRAVENOUS
  Filled 2015-09-20 (×4): qty 100

## 2015-09-20 MED ORDER — AMLODIPINE BESYLATE 10 MG PO TABS: 10.0000 mg | ORAL_TABLET | Freq: Every day | ORAL | Status: DC

## 2015-09-20 MED ORDER — ONDANSETRON HCL 8 MG PO TABS: 8.0000 mg | ORAL_TABLET | Freq: Three times a day (TID) | ORAL | Status: AC | PRN

## 2015-09-20 MED ORDER — SODIUM CHLORIDE 0.45 % IV SOLN
INTRAVENOUS | Status: DC
  Administered 2015-09-20: 08:00:00 via INTRAVENOUS
  Filled 2015-09-20 (×3): qty 1000

## 2015-09-20 MED ORDER — FOLIC ACID 1 MG PO TABS: 1.0000 mg | ORAL_TABLET | Freq: Every day | ORAL | Status: AC

## 2015-09-20 MED ORDER — METOCLOPRAMIDE HCL 10 MG PO TABS: 10.0000 mg | ORAL_TABLET | Freq: Three times a day (TID) | ORAL | Status: AC

## 2015-09-20 MED ORDER — MAGNESIUM SULFATE 2 GM/50ML IV SOLN
2.0000 g | Freq: Once | INTRAVENOUS | Status: AC
  Administered 2015-09-20: 2 g via INTRAVENOUS
  Filled 2015-09-20: qty 50

## 2015-09-20 NOTE — Progress Notes (Signed)
Critical lab received- K 2.7. MD paged. New orders given.   Raliegh Ip RN

## 2015-09-20 NOTE — Progress Notes (Signed)
PROGRESS NOTE  Crystal Peck  [id number] DOB: 1965-07-22 DOA: 09/16/2015 PCP: Guadlupe Spanish, MD  Brief Narrative:   The patient is a 50 year old female with history of asthma, diabetes mellitus type 2, hypertension, GERD, atrial fibrillation, thyroid disease, gastroparesis with chronic abdominal pain. She presented with a one-day history of nausea, vomiting, and abdominal pain which became progressively worse. Her pain was located in the middle of her abdomen and was waxing and waning but sharp in nature. She had nonbloody, nonbilious emesis and was unable to keep liquids down. Eating and drinking worsened her pain. She stated she had some dysuria and increased urinary frequency over the last week and that her blood sugars have been over 500 at home. In the emergency department, she was noted to be in DKA and started on insulin drip. There was concern that she may be septic and was started on the sepsis protocol with vancomycin and Zosyn and IV fluids.  Assessment & Plan:   Active Problems:   Benign neoplasm of brain (HCC)   HLD (hyperlipidemia)   Essential hypertension   GERD   PAF (paroxysmal atrial fibrillation) (HCC)   Graves' disease   Diabetic gastroparesis (HCC)   DKA, type 2 (HCC)   SIRS (systemic inflammatory response syndrome) (HCC)   Elevated lactic acid due to dehydration from vomiting.  DKA less likely given normal bicarb and normal pH.  Resolved.  Hyperglycemia with home glucose over past week reported to be >500. Glucose stabilizer initiated at [facility]. Multiple recent diabetes regimen changes lately causing confusion.  - continue BID lantus - Continue low-dose sliding scale insulin with 3 units standing aspart with meals - Diabetes educator assistance appreciated - A1c 10.8 - Hold Invokona and will not resume at discharge due to risk of dehydration   Sepsis ruled out: WBC 8.9, febrile to 101.9 and tachycardic, no identifyable source, CXR, UA, and Abd CT  unremarkable. Initially treated w/ IVF and vanc/zosyn per protocol. Fever may have been from viral gastro in setting of DKA.  - UCX NGF and [place] NGTD  N/V/Abd pain: suspect gastroparesis given DKA. Pt w/ numerous ED visits and admissions for diabetic gastroparesis over the last 9 months. pain with recurrent nausea and vomiting yesterday - Continue reglan and IV fluids - continue home phenergan, Bentyl - continue PPI - continue bisacodyl and miralax   HTN, BP rising - increase norvasc and continue atenolol - Continue to hold clonidine, HCTZ, lisinopril  AKI, baseline creatinine 0.62 and trended up to 1.03, trending down with IVF -  Repeat BMP In AM -  Will hold on resuming ACEI  Normocytic anemia, concerning for hemorrhage given history of upper GIB and rate of decline -  Iron studies and B12 wnl.  Folate mildly low 5.4.  -  Started folate supplementation -  TSH mildly low (as expected) -  Occult stool negative -  hgb approximately stable  Thyroid dysfunction: h/o Graves - TSH 0.245 - continue tapazole  GERD: - continue ppi  Paroxysmal afib: sinus.  - CHADs2vasc = 3, (HTN, DM, F) - resume Xarelto - continue atenolol  Elevated troponin, 0.18 peak on 5/29 likely from dehydration and hyperglycemia, history of exertional chest pains for the last month.  -  F/u with Dr. Radford Pax within 1 week of discharge -  Low risk coronary CTa 11/21/2014  HLD: - continue lipitor  Depression: - continue lexapro  Anxiety: - continue xanax  Cerebellar tumor, benign. No further workup at this point. Managed/monitored by PCP  Hypokalemia, oral potassium supplementation  Vaginal itching, likely candidal vulvovaginitis from hyperglycemia and antibiotics -  Diflucan x 1   DVT prophylaxis:  xarelto Code Status: FULL  Family Communication: none  Disposition Plan:  Home tomorrow if tolerating PO, vomiting improved   Consultants:   none  Procedures:  none  Antimicrobials:    Ceftriaxone 5/30 > 5/31  Vancomycin 5/30 > 5/31  Zosyn 5/29   Subjective: Had severe pain and nausea and vomiting yesterday and was not able to eat well.  Potassium low again today and she is concerned.   Objective: Filed Vitals:   09/19/15 1558 09/19/15 2025 09/20/15 0457 09/20/15 1212  BP: 182/70 177/69 139/65 146/61  Pulse: 77 93 83 84  Temp: 98.9 F (37.2 C) 99.1 F (37.3 C) 99.1 F (37.3 C) 98.4 F (36.9 C)  TempSrc: Oral Oral Oral Oral  Resp: 17 20 18 19   Height:      Weight:      SpO2: 100% 100% 100% 100%    Intake/Output Summary (Last 24 hours) at 09/20/15 1514 Last data filed at 09/20/15 0830  Gross per 24 hour  Intake    240 ml  Output    300 ml  Net    -60 ml   Filed Weights   09/17/15 0645  Weight: 76.4 kg (168 lb 6.9 oz)    Examination:  General exam:  Adult female.  No acute distress.  HEENT:  NCAT, MMM Respiratory system: Clear to auscultation bilaterally Cardiovascular system: Regular rate and rhythm, normal S1/S2. No murmurs, rubs, gallops or clicks.  Warm extremities Gastrointestinal system: Normal active bowel sounds, soft, nondistended, nontender MSK:  Normal tone and bulk, no lower extremity edema Neuro:  Grossly intact    Data Reviewed: I have personally reviewed following labs and imaging studies  CBC:  Recent Labs Lab 09/16/15 1255 09/16/15 2240 09/18/15 0318 09/18/15 1405 09/19/15 0320  WBC 6.6 8.9 7.4  --  6.3  NEUTROABS 5.1 7.9*  --   --   --   HGB 11.5* 11.5* 8.5* 9.7* 8.7*  HCT 34.8* 34.5* 26.8* 30.6* 28.0*  MCV 87.2 86.9 87.0  --  87.5  PLT 351 350 270  --  0000000   Basic Metabolic Panel:  Recent Labs Lab 09/17/15 0722  09/17/15 1634 09/17/15 1952 09/18/15 0318 09/19/15 0320 09/20/15 0313  NA 141  < > 136 138 138 140 138  K 2.8*  < > 2.3* 2.9* 3.3* 3.5 2.7*  CL 102  < > 102 103 106 109 104  CO2 28  < > 27 28 23 25 25   GLUCOSE 141*  < > 153* 130* 160* 217* 127*  BUN 8  < > <5* <5* <5* 19 10  CREATININE  0.73  < > 0.62 0.63 1.02* 1.03* 0.76  CALCIUM 8.3*  < > 8.2* 8.4* 7.7* 8.3* 8.4*  MG 1.5*  --   --   --   --   --   --   PHOS 2.5  --   --   --   --   --   --   < > = values in this interval not displayed. GFR: Estimated Creatinine Clearance: 78.6 mL/min (by C-G formula based on Cr of 0.76). Liver Function Tests:  Recent Labs Lab 09/16/15 1255 09/16/15 2240 09/19/15 0320  AST 22 34 17  ALT 22 24 17   ALKPHOS 86 89 60  BILITOT 0.5 1.0 0.5  PROT 7.6 8.0 5.0*  ALBUMIN 4.0 4.1 2.4*  Recent Labs Lab 09/16/15 1255  LIPASE 37   No results for input(s): AMMONIA in the last 168 hours. Coagulation Profile:  Recent Labs Lab 09/17/15 0722  INR 1.11   Cardiac Enzymes:  Recent Labs Lab 09/16/15 1235 09/16/15 1405  TROPONINI 0.18* <0.03   BNP (last 3 results)  Recent Labs  11/14/14 1033  PROBNP 19.0   HbA1C: No results for input(s): HGBA1C in the last 72 hours. CBG:  Recent Labs Lab 09/19/15 1115 09/19/15 1631 09/19/15 2118 09/20/15 0623 09/20/15 1132  GLUCAP 214* 214* 197* 90 116*   Lipid Profile: No results for input(s): CHOL, HDL, LDLCALC, TRIG, CHOLHDL, LDLDIRECT in the last 72 hours. Thyroid Function Tests: No results for input(s): TSH, T4TOTAL, FREET4, T3FREE, THYROIDAB in the last 72 hours. Anemia Panel:  Recent Labs  09/19/15 0320  VITAMINB12 382  FOLATE 5.3*  FERRITIN 328*  TIBC 256  IRON 61   Urine analysis:    Component Value Date/Time   COLORURINE YELLOW 09/16/2015 2310   APPEARANCEUR CLEAR 09/16/2015 2310   LABSPEC 1.022 09/16/2015 2310   PHURINE 7.5 09/16/2015 2310   GLUCOSEU >1000* 09/16/2015 2310   HGBUR TRACE* 09/16/2015 2310   [place] 09/16/2015 2310   KETONESUR >80* 09/16/2015 2310   PROTEINUR NEGATIVE 09/16/2015 2310   UROBILINOGEN 0.2 02/16/2015 1848   NITRITE NEGATIVE 09/16/2015 2310   LEUKOCYTESUR NEGATIVE 09/16/2015 2310   Sepsis Labs: @LABRCNTIP (procalcitonin:4,lacticidven:4)  ) Recent Results  (from the past 240 hour(s))  Urine culture     Status: Abnormal   Collection Time: 09/16/15  1:34 PM  Result Value Ref Range Status   Specimen Description URINE, CLEAN CATCH  Final   Special Requests BACTRM Normal  Final   Culture MULTIPLE SPECIES PRESENT, SUGGEST RECOLLECTION (A)  Final   Report Status 09/18/2015 FINAL  Final  Urine culture     Status: None   Collection Time: 09/16/15 11:10 PM  Result Value Ref Range Status   Specimen Description URINE, CLEAN CATCH  Final   Special Requests NONE  Final   Culture NO GROWTH Performed at [facility]   Final   Report Status 09/18/2015 FINAL  Final  Blood Culture (routine x 2)     Status: None (Preliminary result)   Collection Time: 09/16/15 11:30 PM  Result Value Ref Range Status   Specimen Description BLOOD ARM RIGHT  Final   Special Requests IN PEDIATRIC BOTTLE 3cc  Final   Culture   Final    NO GROWTH 2 DAYS Performed at [facility]    Report Status PENDING  Incomplete  MRSA PCR Screening     Status: None   Collection Time: 09/17/15  6:59 AM  Result Value Ref Range Status   MRSA by PCR NEGATIVE NEGATIVE Final    Comment:        The GeneXpert MRSA Assay (FDA approved for NASAL specimens only), is one component of a comprehensive MRSA colonization surveillance program. It is not intended to diagnose MRSA infection nor to guide or monitor treatment for MRSA infections.       Radiology Studies: No results found.   Scheduled Meds: . amLODipine  5 mg Oral Daily  . atenolol  100 mg Oral QPM  . atorvastatin  80 mg Oral Daily  . dicyclomine  20 mg Oral BID  . folic acid  1 mg Oral Daily  . insulin aspart  0-5 Units Subcutaneous QHS  . insulin aspart  0-9 Units Subcutaneous TID WC  . insulin  aspart  3 Units Subcutaneous TID WC  . insulin glargine  20 Units Subcutaneous BID  . methimazole  7.5 mg Oral QPM  . metoCLOPramide (REGLAN) injection  10 mg Intravenous Q8H  . pantoprazole  40 mg Oral BID AC    . polyethylene glycol  17 g Oral Daily  . rivaroxaban  20 mg Oral Q supper  . sodium chloride flush  3 mL Intravenous Q12H   Continuous Infusions: . sodium chloride 0.45 % with kcl 100 mL/hr at 09/20/15 0814     LOS: 3 days    Time spent: 30 min    Janece Canterbury, MD [facility] Pager [phone]  If 7PM-7AM, please contact night-coverage [email or web address] Password [facility] 09/20/2015, 3:14 PM

## 2015-09-20 NOTE — Discharge Summary (Addendum)
Physician Discharge Summary  Crystal Peck [id number] DOB: 03-10-1966 DOA: 09/16/2015  PCP: Guadlupe Spanish, MD  Admit date: 09/16/2015 Discharge date: 09/20/2015  Recommendations for Outpatient Follow-up:  1. Follow up with gastroenterology in 1-2 weeks for reevaluation 2. F/u with PCP in 1 week for repeat BMP to check potassium 3. F/u with Dr. Radford Pax in 2-3 weeks for evaluation of chest pains  Discharge Diagnoses:  Principal Problem:   Diabetic gastroparesis ([place]) Active Problems:   Benign neoplasm of brain ([place])   HLD (hyperlipidemia)   Essential hypertension   GERD   PAF (paroxysmal atrial fibrillation) ([facility])   Graves' disease   SIRS (systemic inflammatory response syndrome) ([place])   Discharge Condition: stable, improved  Diet recommendation: diabetic  Wt Readings from Last 3 Encounters:  09/17/15 76.4 kg (168 lb 6.9 oz)  09/16/15 72.576 kg (160 lb)  09/08/15 72.576 kg (160 lb)    History of present illness:   The patient is a 50 year old female with history of asthma, diabetes mellitus type 2, hypertension, GERD, atrial fibrillation, thyroid disease, gastroparesis with chronic abdominal pain. She presented with a one-day history of nausea, vomiting, and abdominal pain which became progressively worse. Her pain was located in the middle of her abdomen and was waxing and waning but sharp in nature. She had nonbloody, nonbilious emesis and was unable to keep liquids down. Eating and drinking worsened her pain. She stated she had some dysuria and increased urinary frequency over the last week and that her blood sugars have been over 500 at home. In the emergency department, she was noted to be in DKA and started on insulin drip. There was concern that she may be septic and was started on the sepsis protocol with vancomycin and Zosyn and IV fluids.  Hospital Course:   Elevated lactic acid due to dehydration from vomiting. DKA less likely given normal bicarb and normal pH.  Resolved with IVF.    Hyperglycemia with home glucose over past week reported to be >500 but was only 302 with normal anion gap and CO2 of 28 at admission.  Glucose stabilizer initiated at [facility]. Multiple recent diabetes regimen changes lately caused confusion which resulted in transient hyperglycemia.  During her hospitalization, she had decreased PO intake and her insulin doses were reduced.  She was advised to continue reduced insulin until she was eating normally and her blood sugars were routinely more than 180 before increasing back to her usual insulin doses.  Her A1c was 10.8.  Her invokana was discontinued for now until she follows up with her PCP to reduce her risk of dehydration should she have a recurrence of her nausea and vomiting before then.    Sepsis ruled out: WBC 8.9, febrile to 101.9 and tachycardic, no identifyable source, CXR, UA, and Abd CT were unremarkable. Initially treated w/ IVF and vanc/zosyn per protocol but her antibiotics were discontinued once her urine and blood cultures were reported negative.  Fever may have been from viral gastroenteritis.   Gastroparesis with nausea, vomiting, and abdominal pain.  Pt w/ numerous ED visits and admissions for diabetic gastroparesis over the last 9 months. She improved with reglan, zofran, and phenergan.  Recommend PPI and bowel regimen to prevent constipation also.  She should push fluids and return to the ER should she have worsening vomiting and dehydration.  Advised her to follow up with her gastroenterologist in 1-2 weeks to discuss further treatment options.    HTN, blood pressures elevated.  I increased her norvasc to  10 mg and she should follow up with her primary care doctor in a few days for reevaluation.  Recommend against HCTZ and ACEI at this time due to risk for dehydration and AKI.  AKI, baseline creatinine 0.62 and trended up to 1.03, trended down with IVF.  Held ACEI and HCTZ.  Normocytic anemia, concerning for  hemorrhage given history of upper GIB and rate of decline.   - Iron studies and B12 wnl. Folate mildly low 5.4.  - Started folate supplementation - TSH mildly low (as expected) - Occult stool negative - hgb approximately stable  Thyroid dysfunction: h/o Graves - TSH 0.245 - continued tapazole  GERD: - continued ppi  Paroxysmal afib: sinus.  - CHADs2vasc = 3, (HTN, DM, F) - resumed Xarelto - continued atenolol  Elevated troponin, 0.18 peak on 5/29 likely from dehydration and hyperglycemia that came down to <0.03, so I suspect her initial test was spurious.  She did not have chest pains during hospitalization, however, she did report some exertional chest pains for the last month. Low risk coronary CTa 11/21/2014.  F/u with Dr. Radford Pax within 1 week of discharge for further testing.    HLD: - continue lipitor  Depression: - continue lexapro  Anxiety: - continue xanax  Cerebellar tumor, benign. No further workup at this point. Managed/monitored by PCP  Hypokalemia, given oral potassium supplementation  Vaginal itching, likely candidal vulvovaginitis from hyperglycemia and antibiotics - Diflucan x 1  Consultants:   none  Procedures:  none  Antimicrobials:   Ceftriaxone 5/30 > 5/31  Vancomycin 5/30 > 5/31  Zosyn 5/29  Discharge Exam: Filed Vitals:   09/20/15 0457 09/20/15 1212  BP: 139/65 146/61  Pulse: 83 84  Temp: 99.1 F (37.3 C) 98.4 F (36.9 C)  Resp: 18 19   Filed Vitals:   09/19/15 1558 09/19/15 2025 09/20/15 0457 09/20/15 1212  BP: 182/70 177/69 139/65 146/61  Pulse: 77 93 83 84  Temp: 98.9 F (37.2 C) 99.1 F (37.3 C) 99.1 F (37.3 C) 98.4 F (36.9 C)  TempSrc: Oral Oral Oral Oral  Resp: 17 20 18 19   Height:      Weight:      SpO2: 100% 100% 100% 100%    General exam: Adult female. No acute distress.  HEENT: NCAT, MMM Respiratory system: Clear to auscultation bilaterally Cardiovascular system: Regular rate and rhythm,  normal S1/S2. No murmurs, rubs, gallops or clicks. Warm extremities Gastrointestinal system: Normal active bowel sounds, soft, nondistended, nontender MSK: Normal tone and bulk, no lower extremity edema Neuro: Grossly intact  Discharge Instructions      Discharge Instructions    Call MD for:  difficulty breathing, headache or visual disturbances    Complete by:  As directed      Call MD for:  extreme fatigue    Complete by:  As directed      Call MD for:  hives    Complete by:  As directed      Call MD for:  persistant dizziness or light-headedness    Complete by:  As directed      Call MD for:  persistant nausea and vomiting    Complete by:  As directed      Call MD for:  severe uncontrolled pain    Complete by:  As directed      Call MD for:  temperature >100.4    Complete by:  As directed      Diet Carb Modified    Complete by:  As directed      Discharge instructions    Complete by:  As directed   I have made some temporary changes to your medications until you can follow up with your primary care doctor.  Please stop your invokana, fish oil, and bactrim (if you had been taking this medication).  Use reglan 10mg  before meals and before bedtime and use zofran and phenergan for breakthrough nausea and vomiting as needed until you follow up with Dr. Collene Mares.  You will also need to have your potassium level and blood pressure rechecked by your primary care doctor in about a week.     Increase activity slowly    Complete by:  As directed             Medication List    STOP taking these medications        Fish Oil 1000 MG Caps     INVOKANA 100 MG Tabs tablet  Generic drug:  canagliflozin     sulfamethoxazole-trimethoprim 800-160 MG tablet  Commonly known as:  BACTRIM DS,SEPTRA DS      TAKE these medications        albuterol 108 (90 Base) MCG/ACT inhaler  Commonly known as:  PROVENTIL HFA;VENTOLIN HFA  Inhale 2 puffs into the lungs every 4 (four) hours as needed for  wheezing or shortness of breath.     amLODipine 10 MG tablet  Commonly known as:  NORVASC  Take 1 tablet (10 mg total) by mouth daily.  Start taking on:  09/21/2015     atenolol 100 MG tablet  Commonly known as:  TENORMIN  Take 1 tablet (100 mg total) by mouth every evening.     atorvastatin 80 MG tablet  Commonly known as:  LIPITOR  Take 1 tablet (80 mg total) by mouth daily.     azelastine 0.05 % ophthalmic solution  Commonly known as:  OPTIVAR  Place 1 drop into both eyes 2 (two) times daily.     beclomethasone 80 MCG/ACT inhaler  Commonly known as:  QVAR  Inhale 3 puffs into the lungs daily.     folic acid 1 MG tablet  Commonly known as:  FOLVITE  Take 1 tablet (1 mg total) by mouth daily.     glucose blood test strip  1 each by Other route as needed. Onetouch ultra test strip     Insulin Glargine 100 UNIT/ML Solostar Pen  Commonly known as:  LANTUS SOLOSTAR  Inject 20 Units into the skin 2 (two) times daily. AS DIRECTED     insulin lispro 100 UNIT/ML injection  Commonly known as:  HUMALOG  Inject 12 Units into the skin 3 (three) times daily before meals.     IRON PO  Take 2 tablets by mouth 3 (three) times a week.     metFORMIN 1000 MG tablet  Commonly known as:  GLUCOPHAGE  Take 1,000 mg by mouth daily.     methimazole 5 MG tablet  Commonly known as:  TAPAZOLE  Take 1.5 tablets (7.5 mg total) by mouth every evening.     metoCLOPramide 10 MG tablet  Commonly known as:  REGLAN  Take 1 tablet (10 mg total) by mouth 4 (four) times daily -  before meals and at bedtime.     multivitamin with minerals Tabs tablet  Take 1 tablet by mouth every evening.     omeprazole 20 MG capsule  Commonly known as:  PRILOSEC  Take 1 capsule (20 mg total) by mouth daily.  ondansetron 8 MG tablet  Commonly known as:  ZOFRAN  Take 1 tablet (8 mg total) by mouth every 8 (eight) hours as needed for nausea or vomiting.     polyethylene glycol packet  Commonly known as:   MIRALAX / GLYCOLAX  Take 17 g by mouth daily.     potassium chloride SA 20 MEQ tablet  Commonly known as:  K-DUR,KLOR-CON  Take 1 tablet (20 mEq total) by mouth daily. Take one tablet by mouth daily.     promethazine 25 MG tablet  Commonly known as:  PHENERGAN  Take 1 tablet (25 mg total) by mouth every 6 (six) hours as needed for nausea or vomiting.     rivaroxaban 20 MG Tabs tablet  Commonly known as:  XARELTO  Take 20 mg by mouth daily.     sucralfate 1 g tablet  Commonly known as:  CARAFATE  Take 1 tablet (1 g total) by mouth 4 (four) times daily -  with meals and at bedtime.     Vitamin D (Ergocalciferol) 50000 units Caps capsule  Commonly known as:  DRISDOL  Take 50,000 Units by mouth every 7 (seven) days.       Follow-up Information    Follow up with Guadlupe Spanish, MD. Schedule an appointment as soon as possible for a visit in 1 week.   Specialty:  Internal Medicine   Contact information:   [place] [address] [phone]       Follow up with Fransico Him, MD In 2 weeks.   Specialty:  Cardiology   Contact information:   [id number] [place] [address] [phone]       Follow up with Juanita Craver, MD. Schedule an appointment as soon as possible for a visit in 2 weeks.   Specialty:  Gastroenterology   Contact information:   [address] [place] [address] [id number]        The results of significant diagnostics from this hospitalization (including imaging, microbiology, ancillary and laboratory) are listed below for reference.    Significant Diagnostic Studies: Dg Chest 2 View  09/16/2015  CLINICAL DATA:  Shortness of breath.  Central chest pain.  Emesis. EXAM: CHEST  2 VIEW COMPARISON:  03/30/2015 FINDINGS: Mild enlargement of the cardiopericardial silhouette . Mildly low lung volumes. The lungs appear clear. No pleural effusion. IMPRESSION: 1. Mild enlargement of the cardiopericardial  silhouette. No edema. Otherwise negative. Electronically Signed   By: Van Clines M.D.   On: 09/16/2015 23:01   Dg Abd 1 View  08/29/2015  CLINICAL DATA:  Mid and lower abdominal pain for 4-5 days. Initial encounter. EXAM: ABDOMEN - 1 VIEW COMPARISON:  CT abdomen and pelvis 03/31/2015. Chest in two views abdomen 03/30/2015. FINDINGS: The bowel gas pattern is normal. No radio-opaque calculi or other significant radiographic abnormality are seen. A few scattered phleboliths are noted IMPRESSION: No acute abnormality. Electronically Signed   By: Inge Rise M.D.   On: 08/29/2015 10:03   Ct Abdomen Pelvis W Contrast  09/17/2015  CLINICAL DATA:  Acute onset of diarrhea and abdominal pain. Nausea and vomiting. Sepsis and fever. Initial encounter. EXAM: CT ABDOMEN AND PELVIS WITH CONTRAST TECHNIQUE: Multidetector CT imaging of the abdomen and pelvis was performed using the standard protocol following bolus administration of intravenous contrast. CONTRAST:  137mL ISOVUE-300 IOPAMIDOL (ISOVUE-300) INJECTION 61% COMPARISON:  CT of the abdomen and pelvis performed 08/29/2015 FINDINGS: The visualized lung bases are clear. The liver  and spleen are unremarkable in appearance. The patient is status post cholecystectomy, with clips noted at the gallbladder fossa. The pancreas and adrenal glands are unremarkable. Mild scarring is noted at the interpole region of the right kidney. There is no evidence of hydronephrosis. No renal or ureteral stones are seen. No perinephric stranding is appreciated. No free fluid is identified. The small bowel is unremarkable in appearance. The stomach is within normal limits. No acute vascular abnormalities are seen. Minimal calcification are seen along the abdominal aorta. The appendix is not well seen; there is no evidence for appendicitis. The ascending colon is somewhat redundant. The colon is grossly unremarkable in appearance. The bladder is mildly distended and contains a  small amount of air, with a Foley catheter in place. The patient is status post hysterectomy. The ovaries are relatively symmetric. No suspicious adnexal masses are seen. No inguinal lymphadenopathy is seen. No acute osseous abnormalities are identified. IMPRESSION: 1. No acute abnormality seen within the abdomen or pelvis. 2. Mild scarring at the interpole region of the right kidney. Electronically Signed   By: Garald Balding M.D.   On: 09/17/2015 02:52   Ct Abdomen Pelvis W Contrast  08/29/2015  CLINICAL DATA:  Abdominal pain and leukocytosis. EXAM: CT ABDOMEN AND PELVIS WITH CONTRAST TECHNIQUE: Multidetector CT imaging of the abdomen and pelvis was performed using the standard protocol following bolus administration of intravenous contrast. CONTRAST:  177mL ISOVUE-300 IOPAMIDOL (ISOVUE-300) INJECTION 61% COMPARISON:  03/31/2015 FINDINGS: Lower chest and abdominal wall:  No contributory findings. Hepatobiliary: Hepatic steatosis.Cholecystectomy with normal common bile duct diameter. Pancreas: Unremarkable. Spleen: Unremarkable. Adrenals/Urinary Tract: Negative adrenals. No hydronephrosis or stone. Focal cortical scarring or fissure in the interpolar right kidney. No hydronephrosis or stone . Negative bladder Reproductive:Hysterectomy.  Pelvic floor laxity.  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 The appendix is in the lower abdominal midline and otherwise unremarkable. No obstruction or inflammatory wall thickening Vascular/Lymphatic: Mild scattered atherosclerotic calcification. No acute vascular finding No mass or adenopathy. Peritoneal: No ascites or pneumoperitoneum. Musculoskeletal: Spondylosis.  Lower lumbar facet arthropathy. IMPRESSION: 1. No acute finding. 2. Hepatic steatosis. 3. Congenital non rotation of bowel. Electronically Signed   By: Monte Fantasia M.D.   On: 08/29/2015 11:33    Microbiology: Recent  Results (from the past 240 hour(s))  Urine culture     Status: Abnormal   Collection Time: 09/16/15  1:34 PM  Result Value Ref Range Status   Specimen Description URINE, CLEAN CATCH  Final   Special Requests BACTRM Normal  Final   Culture MULTIPLE SPECIES PRESENT, SUGGEST RECOLLECTION (A)  Final   Report Status 09/18/2015 FINAL  Final  Urine culture     Status: None   Collection Time: 09/16/15 11:10 PM  Result Value Ref Range Status   Specimen Description URINE, CLEAN CATCH  Final   Special Requests NONE  Final   Culture NO GROWTH Performed at [facility]   Final   Report Status 09/18/2015 FINAL  Final  Blood Culture (routine x 2)     Status: None (Preliminary result)   Collection Time: 09/16/15 11:30 PM  Result Value Ref Range Status   Specimen Description BLOOD ARM RIGHT  Final   Special Requests IN PEDIATRIC BOTTLE 3cc  Final   Culture   Final    NO GROWTH 3 DAYS Performed at [facility]    Report Status PENDING  Incomplete  MRSA  PCR Screening     Status: None   Collection Time: 09/17/15  6:59 AM  Result Value Ref Range Status   MRSA by PCR NEGATIVE NEGATIVE Final    Comment:        The GeneXpert MRSA Assay (FDA approved for NASAL specimens only), is one component of a comprehensive MRSA colonization surveillance program. It is not intended to diagnose MRSA infection nor to guide or monitor treatment for MRSA infections.      Labs: Basic Metabolic Panel:  Recent Labs Lab 09/17/15 0722  09/17/15 1952 09/18/15 0318 09/19/15 0320 09/20/15 0313 09/20/15 1535  NA 141  < > 138 138 140 138 136  K 2.8*  < > 2.9* 3.3* 3.5 2.7* 4.0  CL 102  < > 103 106 109 104 105  CO2 28  < > 28 23 25 25 25   GLUCOSE 141*  < > 130* 160* 217* 127* 213*  BUN 8  < > <5* <5* 19 10 11   CREATININE 0.73  < > 0.63 1.02* 1.03* 0.76 0.84  CALCIUM 8.3*  < > 8.4* 7.7* 8.3* 8.4* 9.0  MG 1.5*  --   --   --   --   --   --   PHOS 2.5  --   --   --   --   --   --   < > =  values in this interval not displayed. Liver Function Tests:  Recent Labs Lab 09/16/15 1255 09/16/15 2240 09/19/15 0320  AST 22 34 17  ALT 22 24 17   ALKPHOS 86 89 60  BILITOT 0.5 1.0 0.5  PROT 7.6 8.0 5.0*  ALBUMIN 4.0 4.1 2.4*    Recent Labs Lab 09/16/15 1255  LIPASE 37   No results for input(s): AMMONIA in the last 168 hours. CBC:  Recent Labs Lab 09/16/15 1255 09/16/15 2240 09/18/15 0318 09/18/15 1405 09/19/15 0320  WBC 6.6 8.9 7.4  --  6.3  NEUTROABS 5.1 7.9*  --   --   --   HGB 11.5* 11.5* 8.5* 9.7* 8.7*  HCT 34.8* 34.5* 26.8* 30.6* 28.0*  MCV 87.2 86.9 87.0  --  87.5  PLT 351 350 270  --  278   Cardiac Enzymes:  Recent Labs Lab 09/16/15 1235 09/16/15 1405  TROPONINI 0.18* <0.03   BNP: BNP (last 3 results) No results for input(s): BNP in the last 8760 hours.  ProBNP (last 3 results)  Recent Labs  11/14/14 1033  PROBNP 19.0    CBG:  Recent Labs Lab 09/19/15 1631 09/19/15 2118 09/20/15 0623 09/20/15 1132 09/20/15 1601  GLUCAP 214* 197* 90 116* 212*    Time coordinating discharge: 35 minutes  Signed:  Rickey Sadowski  [facility] 09/20/2015, 5:19 PM

## 2015-09-21 ENCOUNTER — Emergency Department (HOSPITAL_BASED_OUTPATIENT_CLINIC_OR_DEPARTMENT_OTHER)
Admission: EM | Admit: 2015-09-21 | Discharge: 2015-09-21 | Disposition: A | Discharge status: Home / Self Care | Payer: Medicaid Other | Attending: Emergency Medicine | Admitting: Emergency Medicine

## 2015-09-21 ENCOUNTER — Encounter (HOSPITAL_BASED_OUTPATIENT_CLINIC_OR_DEPARTMENT_OTHER): Payer: Self-pay

## 2015-09-21 DIAGNOSIS — R109 Unspecified abdominal pain: Secondary | ICD-10-CM | POA: Insufficient documentation

## 2015-09-21 DIAGNOSIS — I4891 Unspecified atrial fibrillation: Secondary | ICD-10-CM | POA: Insufficient documentation

## 2015-09-21 DIAGNOSIS — Z79899 Other long term (current) drug therapy: Secondary | ICD-10-CM | POA: Diagnosis not present

## 2015-09-21 DIAGNOSIS — Z794 Long term (current) use of insulin: Secondary | ICD-10-CM | POA: Insufficient documentation

## 2015-09-21 DIAGNOSIS — E785 Hyperlipidemia, unspecified: Secondary | ICD-10-CM | POA: Insufficient documentation

## 2015-09-21 DIAGNOSIS — G8929 Other chronic pain: Secondary | ICD-10-CM

## 2015-09-21 DIAGNOSIS — Z7984 Long term (current) use of oral hypoglycemic drugs: Secondary | ICD-10-CM | POA: Diagnosis not present

## 2015-09-21 DIAGNOSIS — E119 Type 2 diabetes mellitus without complications: Secondary | ICD-10-CM | POA: Diagnosis not present

## 2015-09-21 DIAGNOSIS — I10 Essential (primary) hypertension: Secondary | ICD-10-CM | POA: Insufficient documentation

## 2015-09-21 DIAGNOSIS — R112 Nausea with vomiting, unspecified: Secondary | ICD-10-CM | POA: Diagnosis not present

## 2015-09-21 DIAGNOSIS — J45909 Unspecified asthma, uncomplicated: Secondary | ICD-10-CM | POA: Diagnosis not present

## 2015-09-21 MED ORDER — HALOPERIDOL LACTATE 5 MG/ML IJ SOLN
5.0000 mg | Freq: Once | INTRAMUSCULAR | Status: AC
  Administered 2015-09-21: 5 mg via INTRAVENOUS
  Filled 2015-09-21: qty 1

## 2015-09-21 MED ORDER — DIPHENHYDRAMINE HCL 50 MG/ML IJ SOLN
50.0000 mg | Freq: Once | INTRAMUSCULAR | Status: AC
  Administered 2015-09-21: 50 mg via INTRAVENOUS
  Filled 2015-09-21: qty 1

## 2015-09-21 MED ORDER — METOCLOPRAMIDE HCL 5 MG/ML IJ SOLN
10.0000 mg | Freq: Once | INTRAMUSCULAR | Status: AC
  Administered 2015-09-21: 10 mg via INTRAVENOUS
  Filled 2015-09-21: qty 2

## 2015-09-21 NOTE — ED Provider Notes (Signed)
CSN: [id number]     Arrival date & time 09/21/15  [id number] History   First MD Initiated Contact with Patient 09/21/15 [phone]     Chief Complaint  Patient presents with  . Emesis     (Consider location/radiation/quality/duration/timing/severity/associated sxs/prior Treatment) HPI  Crystal Peck is a 50 y.o. female with PMH of DM, chronic abdominal pain and gastroparesis here with an acute exacerbation.  Patient was just DC'ed from [facility] earlier today and presents for repeat vomiting.  She denies fever or diarrhea.  She states her abdomen hurts as well and this is her normal gastroparesis.  She denies urinary of vaginal complaints.  She states only haldol helps her with vomiting, she is also requesting narcotic pain medication.  10 Systems reviewed and are negative for acute change except as noted in the HPI.    Past Medical History  Diagnosis Date  . Asthma   . Diabetes mellitus   . Hyperlipidemia   . Hypertension   . GERD (gastroesophageal reflux disease)   . Cerebellar tumor ([place])   . Obesity   . Sleep apnea   . A-fib ([place])   . Thyroid disease   . Chest pain 03/27/2014    negative lexiscan myoview  . Gastroparesis   . Chronic abdominal pain   . Nausea & vomiting 08/2015  . DKA (diabetic ketoacidoses) ([place])   . Sepsis ([place]) 08/2015  . DKA (diabetic ketoacidoses) ([place]) 08/2015   Past Surgical History  Procedure Laterality Date  . Cholecystectomy    . Partial hysterectomy      ovaries remain  . Nasal sinus surgery      Dr. Lyn Hollingshead  . Abdominal hysterectomy     Family History  Problem Relation Age of Onset  . Coronary artery disease Father   . Hypertension Father   . Diabetes      maternal grandparents  . Hyperlipidemia Other    Social History  Substance Use Topics  . Smoking status: Never Smoker   . Smokeless tobacco: Never Used  . Alcohol Use: No   OB History    No data available     Review of Systems    Allergies  Review of patient's allergies indicates no  known allergies.  Home Medications   Prior to Admission medications   Medication Sig Start Date End Date Taking? Authorizing Provider  albuterol (PROVENTIL HFA;VENTOLIN HFA) 108 (90 BASE) MCG/ACT inhaler Inhale 2 puffs into the lungs every 4 (four) hours as needed for wheezing or shortness of breath.    Historical Provider, MD  amLODipine (NORVASC) 10 MG tablet Take 1 tablet (10 mg total) by mouth daily. 09/21/15   Janece Canterbury, MD  atenolol (TENORMIN) 100 MG tablet Take 1 tablet (100 mg total) by mouth every evening. 10/15/14   Midge Minium, MD  atorvastatin (LIPITOR) 80 MG tablet Take 1 tablet (80 mg total) by mouth daily. 11/22/14   Sueanne Margarita, MD  azelastine (OPTIVAR) 0.05 % ophthalmic solution Place 1 drop into both eyes 2 (two) times daily. 10/15/14   Midge Minium, MD  beclomethasone (QVAR) 80 MCG/ACT inhaler Inhale 3 puffs into the lungs daily. Patient taking differently: Inhale 3 puffs into the lungs daily as needed (wheezing).  10/12/14   Colon Branch, MD  folic acid (FOLVITE) 1 MG tablet Take 1 tablet (1 mg total) by mouth daily. 09/20/15   Janece Canterbury, MD  glucose blood test strip 1 each by Other route as needed. Onetouch ultra test strip  Historical Provider, MD  Insulin Glargine (LANTUS SOLOSTAR) 100 UNIT/ML Solostar Pen Inject 20 Units into the skin 2 (two) times daily. AS DIRECTED 09/20/15   Janece Canterbury, MD  insulin lispro (HUMALOG) 100 UNIT/ML injection Inject 12 Units into the skin 3 (three) times daily before meals.    Historical Provider, MD  IRON PO Take 2 tablets by mouth 3 (three) times a week.     Historical Provider, MD  metFORMIN (GLUCOPHAGE) 1000 MG tablet Take 1,000 mg by mouth daily.     Historical Provider, MD  methimazole (TAPAZOLE) 5 MG tablet Take 1.5 tablets (7.5 mg total) by mouth every evening. 09/20/15   Janece Canterbury, MD  metoCLOPramide (REGLAN) 10 MG tablet Take 1 tablet (10 mg total) by mouth 4 (four) times daily -  before meals and at  bedtime. 09/20/15 09/30/15  Janece Canterbury, MD  Multiple Vitamin (MULTIVITAMIN WITH MINERALS) TABS tablet Take 1 tablet by mouth every evening.     Historical Provider, MD  omeprazole (PRILOSEC) 20 MG capsule Take 1 capsule (20 mg total) by mouth daily. 09/20/15   Janece Canterbury, MD  ondansetron (ZOFRAN) 8 MG tablet Take 1 tablet (8 mg total) by mouth every 8 (eight) hours as needed for nausea or vomiting. 09/20/15   Janece Canterbury, MD  polyethylene glycol [facility] / GLYCOLAX) packet Take 17 g by mouth daily. 01/30/15   Historical Provider, MD  potassium chloride SA (K-DUR,KLOR-CON) 20 MEQ tablet Take 1 tablet (20 mEq total) by mouth daily. Take one tablet by mouth daily. 09/20/15   Janece Canterbury, MD  promethazine (PHENERGAN) 25 MG tablet Take 1 tablet (25 mg total) by mouth every 6 (six) hours as needed for nausea or vomiting. 09/16/15   Isla Pence, MD  rivaroxaban (XARELTO) 20 MG TABS tablet Take 20 mg by mouth daily. 07/02/14   Historical Provider, MD  sucralfate (CARAFATE) 1 G tablet Take 1 tablet (1 g total) by mouth 4 (four) times daily -  with meals and at bedtime. 12/20/14   Midge Minium, MD  Vitamin D, Ergocalciferol, (DRISDOL) 50000 UNITS CAPS capsule Take 50,000 Units by mouth every 7 (seven) days.    Historical Provider, MD   BP 191/102 mmHg  Pulse 89  Temp(Src) 99 F (37.2 C) (Oral)  Resp 20  SpO2 100% Physical Exam  Constitutional: She is oriented to person, place, and time. She appears well-developed and well-nourished. No distress.  HENT:  Head: Normocephalic and atraumatic.  Nose: Nose normal.  Mouth/Throat: Oropharynx is clear and moist. No oropharyngeal exudate.  Eyes: Conjunctivae and EOM are normal. Pupils are equal, round, and reactive to light. No scleral icterus.  Neck: Normal range of motion. Neck supple. No JVD present. No tracheal deviation present. No thyromegaly present.  Cardiovascular: Normal rate, regular rhythm and normal heart sounds.  Exam reveals no  gallop and no friction rub.   No murmur heard. Pulmonary/Chest: Effort normal and breath sounds normal. No respiratory distress. She has no wheezes. She exhibits no tenderness.  Abdominal: Soft. Bowel sounds are normal. She exhibits no distension and no mass. There is no tenderness. There is no rebound and no guarding.  Musculoskeletal: Normal range of motion. She exhibits no edema or tenderness.  Lymphadenopathy:    She has no cervical adenopathy.  Neurological: She is alert and oriented to person, place, and time. No cranial nerve deficit. She exhibits normal muscle tone.  Skin: Skin is warm and dry. No rash noted. No erythema. No pallor.  Nursing note and  vitals reviewed.   ED Course  Procedures (including critical care time) Labs Review Labs Reviewed - No data to display  Imaging Review No results found. I have personally reviewed and evaluated these images and lab results as part of my medical decision-making.   EKG Interpretation None      MDM   Final diagnoses:  Chronic abdominal pain  Non-intractable vomiting with nausea, vomiting of unspecified type    Patient presents to the ED for abd pain/N/V. She has chronic symptoms and drug seeking behavior. She was given benadryl, haldol, and relgan for her nausea.  She continues to ask for narcotics and they were not given to her.  She was advised to fu with PCP for chronic pain.  Upon repeat evaluation, patient was laying comfortable in bed.  I did not witness any episodes of vomiting through her ED stay.  She appears well and in NAD.  VS remain within her normal limits and she is safe for DC.    Everlene Balls, MD 09/21/15 [phone]

## 2015-09-21 NOTE — ED Notes (Signed)
Pt states d/c from [facility] yesterday at 1830, started vomiting at 2330, states took reglan and zofran with no relief, states morphine is the only thing that helps but don't have any pain meds at home, c/o abdominal pain also

## 2015-09-21 NOTE — Discharge Instructions (Signed)
Chronic Pain Crystal Peck, see your primary care doctor within 3 days for close follow up of your vomiting and abdominal pain. If any symptoms worsen, come back to the ED Immediately. Thank you. Chronic pain can be defined as pain that is off and on and lasts for 3-6 months or longer. Many things cause chronic pain, which can make it difficult to make a diagnosis. There are many treatment options available for chronic pain. However, finding a treatment that works well for you may require trying various approaches until the right one is found. Many people benefit from a combination of two or more types of treatment to control their pain. SYMPTOMS  Chronic pain can occur anywhere in the body and can range from mild to very severe. Some types of chronic pain include:  Headache.  Low back pain.  Cancer pain.  Arthritis pain.  Neurogenic pain. This is pain resulting from damage to nerves. People with chronic pain may also have other symptoms such as:  Depression.  Anger.  Insomnia.  Anxiety. DIAGNOSIS  Your health care provider will help diagnose your condition over time. In many cases, the initial focus will be on excluding possible conditions that could be causing the pain. Depending on your symptoms, your health care provider may order tests to diagnose your condition. Some of these tests may include:   Blood tests.   CT scan.   MRI.   X-rays.   Ultrasounds.   Nerve conduction studies.  You may need to see a specialist.  TREATMENT  Finding treatment that works well may take time. You may be referred to a pain specialist. He or she may prescribe medicine or therapies, such as:   Mindful meditation or yoga.  Shots (injections) of numbing or pain-relieving medicines into the spine or area of pain.  Local electrical stimulation.  Acupuncture.   Massage therapy.   Aroma, color, light, or sound therapy.   Biofeedback.   Working with a physical therapist to  keep from getting stiff.   Regular, gentle exercise.   Cognitive or behavioral therapy.   Group support.  Sometimes, surgery may be recommended.  HOME CARE INSTRUCTIONS   Take all medicines as directed by your health care provider.   Lessen stress in your life by relaxing and doing things such as listening to calming music.   Exercise or be active as directed by your health care provider.   Eat a healthy diet and include things such as vegetables, fruits, fish, and lean meats in your diet.   Keep all follow-up appointments with your health care provider.   Attend a support group with others suffering from chronic pain. SEEK MEDICAL CARE IF:   Your pain gets worse.   You develop a new pain that was not there before.   You cannot tolerate medicines given to you by your health care provider.   You have new symptoms since your last visit with your health care provider.  SEEK IMMEDIATE MEDICAL CARE IF:   You feel weak.   You have decreased sensation or numbness.   You lose control of bowel or bladder function.   Your pain suddenly gets much worse.   You develop shaking.  You develop chills.  You develop confusion.  You develop chest pain.  You develop shortness of breath.  MAKE SURE YOU:  Understand these instructions.  Will watch your condition.  Will get help right away if you are not doing well or get worse.   This information  is not intended to replace advice given to you by your health care provider. Make sure you discuss any questions you have with your health care provider.   Document Released: 12/27/2001 Document Revised: 12/07/2012 Document Reviewed: 09/30/2012 Elsevier Interactive Patient Education 2016 [employer] Nausea and Vomiting Nausea means you feel sick to your stomach. Throwing up (vomiting) is a reflex where stomach contents come out of your mouth. HOME CARE   Take medicine as told by your doctor.  Do not force  yourself to eat. However, you do need to drink fluids.  If you feel like eating, eat a normal diet as told by your doctor.  Eat rice, wheat, potatoes, bread, lean meats, yogurt, fruits, and vegetables.  Avoid high-fat foods.  Drink enough fluids to keep your pee (urine) clear or pale yellow.  Ask your doctor how to replace body fluid losses (rehydrate). Signs of body fluid loss (dehydration) include:  Feeling very thirsty.  Dry lips and mouth.  Feeling dizzy.  Dark pee.  Peeing less than normal.  Feeling confused.  Fast breathing or heart rate. GET HELP RIGHT AWAY IF:   You have blood in your throw up.  You have black or bloody poop (stool).  You have a bad headache or stiff neck.  You feel confused.  You have bad belly (abdominal) pain.  You have chest pain or trouble breathing.  You do not pee at least once every 8 hours.  You have cold, clammy skin.  You keep throwing up after 24 to 48 hours.  You have a fever. MAKE SURE YOU:   Understand these instructions.  Will watch your condition.  Will get help right away if you are not doing well or get worse.   This information is not intended to replace advice given to you by your health care provider. Make sure you discuss any questions you have with your health care provider.   Document Released: 09/23/2007 Document Revised: 06/29/2011 Document Reviewed: 09/05/2010 Elsevier Interactive Patient Education [employer].

## 2015-09-22 LAB — CULTURE, BLOOD (ROUTINE X 2): CULTURE: NO GROWTH

## 2016-02-04 ENCOUNTER — Encounter (HOSPITAL_BASED_OUTPATIENT_CLINIC_OR_DEPARTMENT_OTHER): Payer: Self-pay | Admitting: Emergency Medicine

## 2016-02-04 ENCOUNTER — Emergency Department (HOSPITAL_BASED_OUTPATIENT_CLINIC_OR_DEPARTMENT_OTHER)
Admission: EM | Admit: 2016-02-04 | Discharge: 2016-02-05 | Disposition: A | Discharge status: Home / Self Care | Payer: Medicaid Other | Attending: Emergency Medicine | Admitting: Emergency Medicine

## 2016-02-04 DIAGNOSIS — Z794 Long term (current) use of insulin: Secondary | ICD-10-CM | POA: Insufficient documentation

## 2016-02-04 DIAGNOSIS — E876 Hypokalemia: Secondary | ICD-10-CM | POA: Diagnosis not present

## 2016-02-04 DIAGNOSIS — R112 Nausea with vomiting, unspecified: Secondary | ICD-10-CM | POA: Diagnosis present

## 2016-02-04 DIAGNOSIS — G8929 Other chronic pain: Secondary | ICD-10-CM

## 2016-02-04 DIAGNOSIS — R197 Diarrhea, unspecified: Secondary | ICD-10-CM | POA: Diagnosis not present

## 2016-02-04 DIAGNOSIS — Z79899 Other long term (current) drug therapy: Secondary | ICD-10-CM | POA: Diagnosis not present

## 2016-02-04 DIAGNOSIS — R1012 Left upper quadrant pain: Secondary | ICD-10-CM | POA: Insufficient documentation

## 2016-02-04 DIAGNOSIS — Z7984 Long term (current) use of oral hypoglycemic drugs: Secondary | ICD-10-CM | POA: Diagnosis not present

## 2016-02-04 DIAGNOSIS — E119 Type 2 diabetes mellitus without complications: Secondary | ICD-10-CM | POA: Diagnosis not present

## 2016-02-04 DIAGNOSIS — J45909 Unspecified asthma, uncomplicated: Secondary | ICD-10-CM | POA: Insufficient documentation

## 2016-02-04 DIAGNOSIS — R109 Unspecified abdominal pain: Secondary | ICD-10-CM

## 2016-02-04 DIAGNOSIS — R1011 Right upper quadrant pain: Secondary | ICD-10-CM | POA: Insufficient documentation

## 2016-02-04 DIAGNOSIS — I10 Essential (primary) hypertension: Secondary | ICD-10-CM | POA: Insufficient documentation

## 2016-02-04 DIAGNOSIS — R1013 Epigastric pain: Secondary | ICD-10-CM | POA: Diagnosis not present

## 2016-02-04 HISTORY — DX: Somatization disorder: F45.0

## 2016-02-04 LAB — CBC WITH DIFFERENTIAL/PLATELET
Basophils Absolute: 0 10*3/uL (ref 0.0–0.1)
Basophils Relative: 0 %
EOS PCT: 0 %
Eosinophils Absolute: 0 10*3/uL (ref 0.0–0.7)
HEMATOCRIT: 36.1 % (ref 36.0–46.0)
Hemoglobin: 12.2 g/dL (ref 12.0–15.0)
LYMPHS ABS: 1.3 10*3/uL (ref 0.7–4.0)
LYMPHS PCT: 15 %
MCH: 28.3 pg (ref 26.0–34.0)
MCHC: 33.8 g/dL (ref 30.0–36.0)
MCV: 83.8 fL (ref 78.0–100.0)
MONO ABS: 0.3 10*3/uL (ref 0.1–1.0)
Monocytes Relative: 3 %
Neutro Abs: 7 10*3/uL (ref 1.7–7.7)
Neutrophils Relative %: 82 %
PLATELETS: 334 10*3/uL (ref 150–400)
RBC: 4.31 MIL/uL (ref 3.87–5.11)
RDW: 12.1 % (ref 11.5–15.5)
WBC: 8.5 10*3/uL (ref 4.0–10.5)

## 2016-02-04 LAB — LIPASE, BLOOD: LIPASE: 21 U/L (ref 11–51)

## 2016-02-04 LAB — COMPREHENSIVE METABOLIC PANEL
ALT: 29 U/L (ref 14–54)
AST: 26 U/L (ref 15–41)
Albumin: 4.2 g/dL (ref 3.5–5.0)
Alkaline Phosphatase: 97 U/L (ref 38–126)
Anion gap: 18 — ABNORMAL HIGH (ref 5–15)
BILIRUBIN TOTAL: 0.7 mg/dL (ref 0.3–1.2)
BUN: 19 mg/dL (ref 6–20)
CHLORIDE: 96 mmol/L — AB (ref 101–111)
CO2: 22 mmol/L (ref 22–32)
CREATININE: 0.77 mg/dL (ref 0.44–1.00)
Calcium: 9.7 mg/dL (ref 8.9–10.3)
Glucose, Bld: 325 mg/dL — ABNORMAL HIGH (ref 65–99)
Potassium: 2.9 mmol/L — ABNORMAL LOW (ref 3.5–5.1)
Sodium: 136 mmol/L (ref 135–145)
TOTAL PROTEIN: 8.1 g/dL (ref 6.5–8.1)

## 2016-02-04 MED ORDER — DIPHENHYDRAMINE HCL 50 MG/ML IJ SOLN
25.0000 mg | Freq: Once | INTRAMUSCULAR | Status: AC
  Administered 2016-02-04: 25 mg via INTRAVENOUS
  Filled 2016-02-04: qty 1

## 2016-02-04 MED ORDER — ACETAMINOPHEN 325 MG PO TABS
650.0000 mg | ORAL_TABLET | Freq: Once | ORAL | Status: AC
  Administered 2016-02-04: 650 mg via ORAL
  Filled 2016-02-04: qty 2

## 2016-02-04 MED ORDER — HALOPERIDOL LACTATE 5 MG/ML IJ SOLN
5.0000 mg | Freq: Once | INTRAMUSCULAR | Status: DC
  Filled 2016-02-04: qty 1

## 2016-02-04 MED ORDER — METOCLOPRAMIDE HCL 5 MG/ML IJ SOLN
10.0000 mg | Freq: Once | INTRAMUSCULAR | Status: AC
  Administered 2016-02-04: 10 mg via INTRAVENOUS
  Filled 2016-02-04: qty 2

## 2016-02-04 MED ORDER — POTASSIUM CHLORIDE CRYS ER 20 MEQ PO TBCR
40.0000 meq | EXTENDED_RELEASE_TABLET | Freq: Once | ORAL | Status: AC
  Administered 2016-02-04: 40 meq via ORAL
  Filled 2016-02-04: qty 2

## 2016-02-04 MED ORDER — POTASSIUM CHLORIDE 10 MEQ/100ML IV SOLN
10.0000 meq | INTRAVENOUS | Status: AC
Start: 2016-02-04 — End: 2016-02-05
  Administered 2016-02-04 – 2016-02-05 (×2): 10 meq via INTRAVENOUS
  Filled 2016-02-04 (×2): qty 100

## 2016-02-04 MED ORDER — KETOROLAC TROMETHAMINE 30 MG/ML IJ SOLN
15.0000 mg | Freq: Once | INTRAMUSCULAR | Status: AC
  Administered 2016-02-04: 15 mg via INTRAVENOUS
  Filled 2016-02-04: qty 1

## 2016-02-04 MED ORDER — SODIUM CHLORIDE 0.9 % IV BOLUS (SEPSIS)
1000.0000 mL | Freq: Once | INTRAVENOUS | Status: AC
  Administered 2016-02-04: 1000 mL via INTRAVENOUS

## 2016-02-04 MED ORDER — ONDANSETRON HCL 4 MG/2ML IJ SOLN
4.0000 mg | Freq: Once | INTRAMUSCULAR | Status: DC
  Filled 2016-02-04: qty 2

## 2016-02-04 MED ORDER — POTASSIUM CHLORIDE CRYS ER 20 MEQ PO TBCR: 20.0000 meq | EXTENDED_RELEASE_TABLET | Freq: Two times a day (BID) | ORAL | 0 refills | Status: AC

## 2016-02-04 MED ORDER — DICYCLOMINE HCL 20 MG PO TABS: 20.0000 mg | ORAL_TABLET | Freq: Two times a day (BID) | ORAL | 0 refills | Status: AC

## 2016-02-04 NOTE — ED Provider Notes (Signed)
[facility] DEPT [facility] Provider Note   CSN: [id number] Arrival date & time: 02/04/16  1927  By signing my name below, I, Irene Pap, attest that this documentation has been prepared under the direction and in the presence of Sherwood Gambler, MD. Electronically Signed: Irene Pap, ED Scribe. 02/04/16. 9:44 PM.  History   Chief Complaint Chief Complaint  Patient presents with  . Nausea   The history is provided by the patient. No language interpreter was used.   HPI Comments: Crystal Peck is a 50 y.o. female with a hx of chronic nausea and vomiting, GERD, DKA, gastroparesis, HTN, DM, A-Fib, and cerebellar tumor who presents to the Emergency Department complaining of gradually worsening generalized abdominal pain onset one day ago. She reports associated nausea, vomiting, and diarrhea. Pt has been seen in the ED multiple times for the same. She says that this pain feels typical of past gastroparesis flare-ups. Pt says that Haldol has worked in the past for her symptoms. She denies fever, hematochezia, dysuria, or hematuria.   Past Medical History:  Diagnosis Date  . A-fib ([place])   . Asthma   . Cerebellar tumor ([place])   . Chest pain 03/27/2014   negative lexiscan myoview  . Chronic abdominal pain   . Diabetes mellitus   . DKA (diabetic ketoacidoses) ([place])   . DKA (diabetic ketoacidoses) ([place]) 08/2015  . Gastroparesis   . GERD (gastroesophageal reflux disease)   . Hyperlipidemia   . Hypertension   . Nausea & vomiting 08/2015  . Obesity   . Sepsis ([place]) 08/2015  . Sleep apnea   . Somatization disorder   . Thyroid disease     Patient Active Problem List   Diagnosis Date Noted  . Sepsis ([place]) 09/17/2015  . DKA, type 2 ([place]) 09/17/2015  . SIRS (systemic inflammatory response syndrome) ([place]) 09/17/2015  . Abdominal pain   . Diabetic gastroparesis ([place]) 02/04/2015  . Intractable nausea and vomiting 02/04/2015  . Nonspecific abnormal electrocardiogram (ECG) (EKG) 02/04/2015   . GERD (gastroesophageal reflux disease) 02/04/2015  . Esophagitis 12/20/2014  . Hypokalemia 11/26/2014  . Edema extremities 11/14/2014  . Graves' disease 10/26/2014  . Anxiety state 10/15/2014  . Physical exam 07/02/2014  . Chest pain 03/28/2014  . Hyperthyroidism 02/21/2014  . URI, acute 02/21/2014  . Dyslipidemia 02/14/2014  . Diabetes mellitus with no complication ([place]) [id number]  . PAF (paroxysmal atrial fibrillation) ([place]) 02/14/2014  . OSA (obstructive sleep apnea) 08/25/2013  . Type II or unspecified type diabetes mellitus without mention of complication, uncontrolled 07/13/2012  . Left leg cellulitis 12/30/2011  . GERD 02/17/2010  . TRANSAMINASES, SERUM, ELEVATED 10/25/2009  . Benign neoplasm of brain ([place]) 09/27/2009  . HLD (hyperlipidemia) 09/27/2009  . Essential hypertension 09/27/2009  . RHINITIS 09/27/2009  . Asthma 09/27/2009    Past Surgical History:  Procedure Laterality Date  . ABDOMINAL HYSTERECTOMY    . CHOLECYSTECTOMY    . NASAL SINUS SURGERY     Dr. Lyn Hollingshead  . PARTIAL HYSTERECTOMY     ovaries remain    OB History    No data available       Home Medications    Prior to Admission medications   Medication Sig Start Date End Date Taking? Authorizing Provider  albuterol (PROVENTIL HFA;VENTOLIN HFA) 108 (90 BASE) MCG/ACT inhaler Inhale 2 puffs into the lungs every 4 (four) hours as needed for wheezing or shortness of breath.    Historical Provider, MD  amLODipine (NORVASC) 10 MG tablet Take 1 tablet (10  mg total) by mouth daily. 09/21/15   Janece Canterbury, MD  atenolol (TENORMIN) 100 MG tablet Take 1 tablet (100 mg total) by mouth every evening. 10/15/14   Midge Minium, MD  atorvastatin (LIPITOR) 80 MG tablet Take 1 tablet (80 mg total) by mouth daily. 11/22/14   Sueanne Margarita, MD  azelastine (OPTIVAR) 0.05 % ophthalmic solution Place 1 drop into both eyes 2 (two) times daily. 10/15/14   Midge Minium, MD  beclomethasone (QVAR) 80 MCG/ACT  inhaler Inhale 3 puffs into the lungs daily. Patient taking differently: Inhale 3 puffs into the lungs daily as needed (wheezing).  10/12/14   Colon Branch, MD  dicyclomine (BENTYL) 20 MG tablet Take 1 tablet (20 mg total) by mouth 2 (two) times daily. 02/04/16   Sherwood Gambler, MD  folic acid (FOLVITE) 1 MG tablet Take 1 tablet (1 mg total) by mouth daily. 09/20/15   Janece Canterbury, MD  glucose blood test strip 1 each by Other route as needed. Onetouch ultra test strip     Historical Provider, MD  Insulin Glargine (LANTUS SOLOSTAR) 100 UNIT/ML Solostar Pen Inject 20 Units into the skin 2 (two) times daily. AS DIRECTED 09/20/15   Janece Canterbury, MD  insulin lispro (HUMALOG) 100 UNIT/ML injection Inject 12 Units into the skin 3 (three) times daily before meals.    Historical Provider, MD  IRON PO Take 2 tablets by mouth 3 (three) times a week.     Historical Provider, MD  metFORMIN (GLUCOPHAGE) 1000 MG tablet Take 1,000 mg by mouth daily.     Historical Provider, MD  methimazole (TAPAZOLE) 5 MG tablet Take 1.5 tablets (7.5 mg total) by mouth every evening. 09/20/15   Janece Canterbury, MD  metoCLOPramide (REGLAN) 10 MG tablet Take 1 tablet (10 mg total) by mouth 4 (four) times daily -  before meals and at bedtime. 09/20/15 09/30/15  Janece Canterbury, MD  Multiple Vitamin (MULTIVITAMIN WITH MINERALS) TABS tablet Take 1 tablet by mouth every evening.     Historical Provider, MD  omeprazole (PRILOSEC) 20 MG capsule Take 1 capsule (20 mg total) by mouth daily. 09/20/15   Janece Canterbury, MD  ondansetron (ZOFRAN) 8 MG tablet Take 1 tablet (8 mg total) by mouth every 8 (eight) hours as needed for nausea or vomiting. 09/20/15   Janece Canterbury, MD  polyethylene glycol [facility] / GLYCOLAX) packet Take 17 g by mouth daily. 01/30/15   Historical Provider, MD  potassium chloride SA (K-DUR,KLOR-CON) 20 MEQ tablet Take 1 tablet (20 mEq total) by mouth 2 (two) times daily. 02/04/16   Sherwood Gambler, MD  promethazine (PHENERGAN) 25  MG tablet Take 1 tablet (25 mg total) by mouth every 6 (six) hours as needed for nausea or vomiting. 09/16/15   Isla Pence, MD  rivaroxaban (XARELTO) 20 MG TABS tablet Take 20 mg by mouth daily. 07/02/14   Historical Provider, MD  sucralfate (CARAFATE) 1 G tablet Take 1 tablet (1 g total) by mouth 4 (four) times daily -  with meals and at bedtime. 12/20/14   Midge Minium, MD  Vitamin D, Ergocalciferol, (DRISDOL) 50000 UNITS CAPS capsule Take 50,000 Units by mouth every 7 (seven) days.    Historical Provider, MD    Family History Family History  Problem Relation Age of Onset  . Coronary artery disease Father   . Hypertension Father   . Diabetes      maternal grandparents  . Hyperlipidemia Other     Social History Social History  Substance Use Topics  . Smoking status: Never Smoker  . Smokeless tobacco: Never Used  . Alcohol use No     Allergies   Review of patient's allergies indicates no known allergies.   Review of Systems Review of Systems  Constitutional: Negative for fever.  Gastrointestinal: Positive for abdominal pain, diarrhea, nausea and vomiting. Negative for blood in stool.  Genitourinary: Negative for dysuria and hematuria.  All other systems reviewed and are negative.  Physical Exam Updated Vital Signs BP 169/71   Pulse 87   Temp 98.1 F (36.7 C) (Oral)   Resp 22   Ht 5\' 1"  (1.549 m)   Wt 163 lb (73.9 kg)   SpO2 100%   BMI 30.80 kg/m   Physical Exam  Constitutional: She is oriented to person, place, and time. She appears well-developed and well-nourished.  Frequently changing positions and grunting  HENT:  Head: Normocephalic and atraumatic.  Right Ear: External ear normal.  Left Ear: External ear normal.  Nose: Nose normal.  Eyes: Right eye exhibits no discharge. Left eye exhibits no discharge.  Cardiovascular: Normal rate, regular rhythm and normal heart sounds.   Pulmonary/Chest: Effort normal and breath sounds normal.  Abdominal: Soft.  There is tenderness in the right upper quadrant, epigastric area and left upper quadrant.  Neurological: She is alert and oriented to person, place, and time.  Skin: Skin is warm and dry.  Nursing note and vitals reviewed.  ED Treatments / Results  DIAGNOSTIC STUDIES: Oxygen Saturation is 100% on RA, normal by my interpretation.   COORDINATION OF CARE: 9:43 PM-Discussed treatment plan which includes labs and Haldol with pt at bedside and pt agreed to plan.    Labs (all labs ordered are listed, but only abnormal results are displayed) Labs Reviewed  COMPREHENSIVE METABOLIC PANEL - Abnormal; Notable for the following:       Result Value   Potassium 2.9 (*)    Chloride 96 (*)    Glucose, Bld 325 (*)    Anion gap 18 (*)    All other components within normal limits  CBC WITH DIFFERENTIAL/PLATELET  LIPASE, BLOOD    EKG  EKG Interpretation  Date/Time:  Tuesday February 04 2016 21:54:03 EDT Ventricular Rate:  87 PR Interval:    QRS Duration: 93 QT Interval:  423 QTC Calculation: 509 R Axis:   10 Text Interpretation:  Sinus rhythm Short PR interval Abnormal R-wave progression, early transition Left ventricular hypertrophy Borderline prolonged QT interval Confirmed by Regenia Skeeter MD, Aaiden Depoy [id number]) on 02/04/2016 10:52:11 PM       Radiology No results found.  Procedures Procedures (including critical care time)  Medications Ordered in ED Medications  potassium chloride 10 mEq in 100 mL IVPB (10 mEq Intravenous New Bag/Given 02/04/16 2302)  sodium chloride 0.9 % bolus 1,000 mL (1,000 mLs Intravenous New Bag/Given 02/04/16 2216)  metoCLOPramide (REGLAN) injection 10 mg (10 mg Intravenous Given 02/04/16 2209)  diphenhydrAMINE (BENADRYL) injection 25 mg (25 mg Intravenous Given 02/04/16 2209)  potassium chloride SA (K-DUR,KLOR-CON) CR tablet 40 mEq (40 mEq Oral Given 02/04/16 2253)  ketorolac (TORADOL) 30 MG/ML injection 15 mg (15 mg Intravenous Given 02/04/16 2301)  acetaminophen  (TYLENOL) tablet 650 mg (650 mg Oral Given 02/04/16 2253)     Initial Impression / Assessment and Plan / ED Course  I have reviewed the triage vital signs and the nursing notes.  Pertinent labs & imaging results that were available during my care of the patient were reviewed by me and considered  in my medical decision making (see chart for details).  Clinical Course  Comment By Time  Will do haldol (patient states this helps her nausea/vomiting and reglan doesn't). This appears to be an exacerbation of chronic pain/gastroparesis, no narcotics. Fluids, ECG for QT, and cardiac monitoring.  Sherwood Gambler, MD 10/17 2152  QTC on ECG today is 509. Will cancel haldol. Discussed with patient/family, will try other options such as reglan/benadryl Sherwood Gambler, MD 10/17 2157  Patient has stopped vomiting but still in pain. We discussed her chronic pain and that there will be no narcotics tonight. Will try tylenol/toradol. Will give PO/IV K for hypokalemia Sherwood Gambler, MD 10/17 2239  Patient is no longer vomiting. Has taken po fluids and po meds without vomiting. Still in pain but I think this is a chronic abdominal issue. Will offer bentyl and d/c with PO potassium. IV potassium still running, plan to d/c after this is done. F/u with GI, PCP, and will give pain specialist referrals for her chronic pain Sherwood Gambler, MD 10/17 2324    Dr. Florina Ou to follow up and if patient is still not vomiting can be discharged after IV potassium runs.  Final Clinical Impressions(s) / ED Diagnoses   Final diagnoses:  Nausea and vomiting in adult  Hypokalemia  Chronic abdominal pain   I personally performed the services described in this documentation, which was scribed in my presence. The recorded information has been reviewed and is accurate.   New Prescriptions New Prescriptions   DICYCLOMINE (BENTYL) 20 MG TABLET    Take 1 tablet (20 mg total) by mouth 2 (two) times daily.   POTASSIUM CHLORIDE SA  (K-DUR,KLOR-CON) 20 MEQ TABLET    Take 1 tablet (20 mEq total) by mouth 2 (two) times daily.     Sherwood Gambler, MD 02/04/16 [phone]

## 2016-02-04 NOTE — ED Notes (Signed)
Patient family came to nurses station requesting a diaper for patient, Patient assisted in getting a brief put on and a new emisis bag given to patient.

## 2016-02-04 NOTE — ED Triage Notes (Addendum)
Patient is alert and oriented x4.  She is being seen for nausea and vomiting that started this morning.  Patient states that this is a chronic issue and she has been vomiting most of the day.

## 2016-02-04 NOTE — ED Notes (Signed)
C/op nausea vomited diarrhea and abd pain x 1 day  Hx of same

## 2016-02-05 ENCOUNTER — Encounter (HOSPITAL_BASED_OUTPATIENT_CLINIC_OR_DEPARTMENT_OTHER): Payer: Self-pay | Admitting: *Deleted

## 2016-02-05 MED ORDER — LORAZEPAM 2 MG/ML IJ SOLN
1.0000 mg | Freq: Once | INTRAMUSCULAR | Status: AC
  Administered 2016-02-05: 1 mg via INTRAVENOUS
  Filled 2016-02-05: qty 1

## 2016-02-05 NOTE — ED Notes (Signed)
Pt waiting for ride to be dc home

## 2016-03-04 IMAGING — DX DG ABDOMEN ACUTE W/ 1V CHEST
4 series · 4 of 4 positions shown · non-contrast
Comparison: Radiograph 03/11/2015

CLINICAL DATA: Vomiting and lower abdominal pain. Constipation for
3 days

EXAM:
DG ABDOMEN ACUTE W/ 1V CHEST

[chest pa]
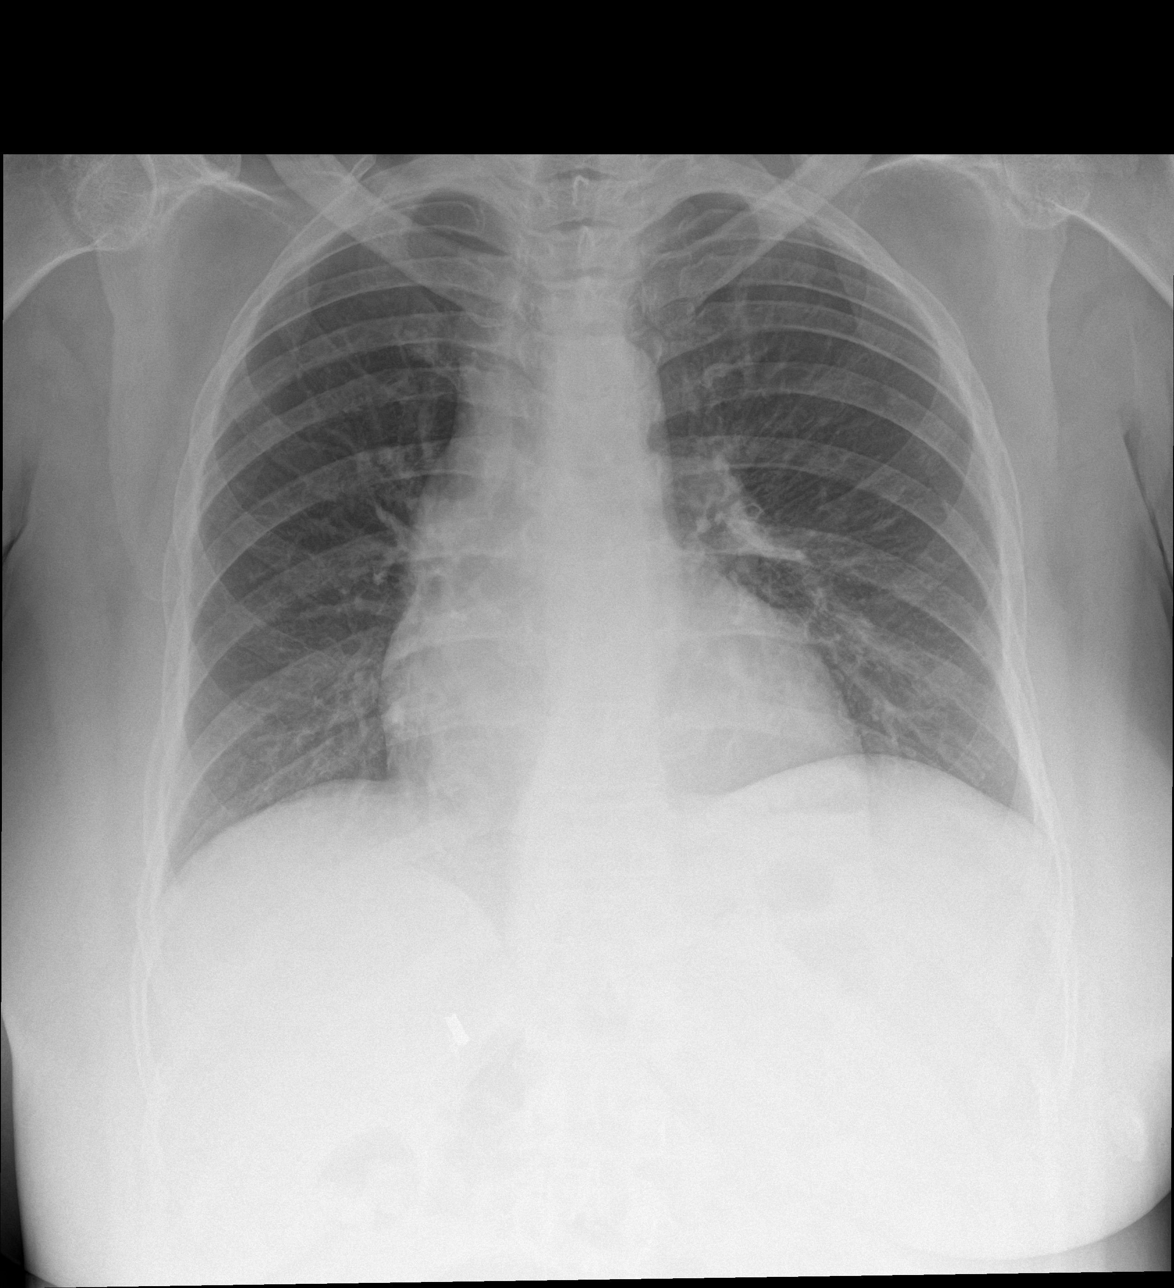

[abdomen erect]
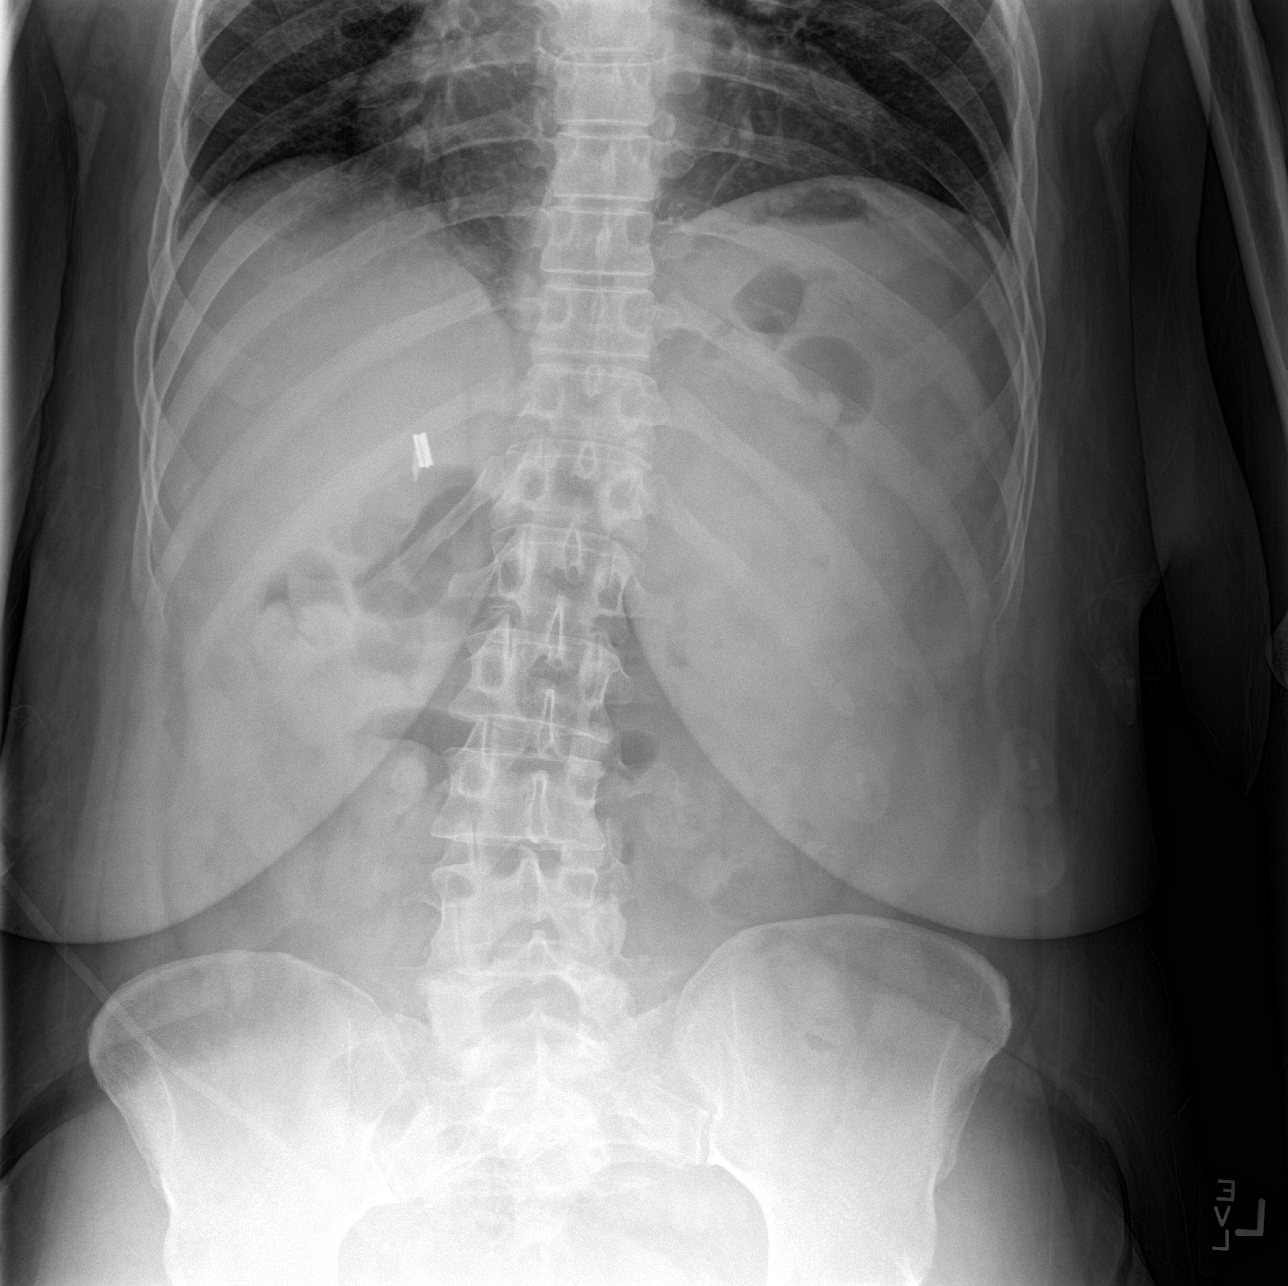

[abdomen supine (1 of 2)]
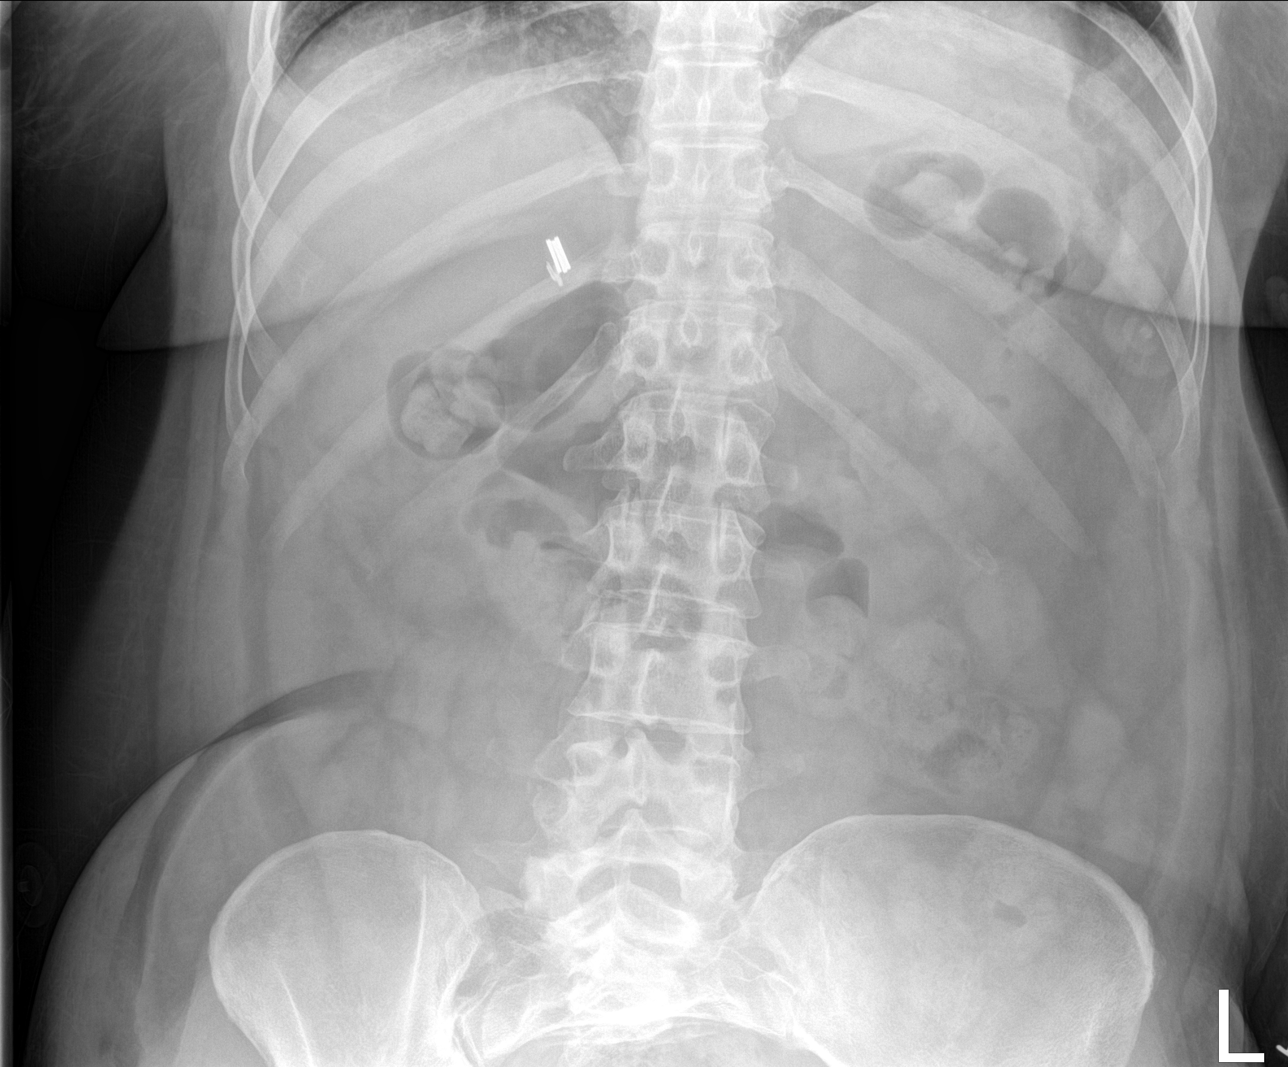

[abdomen supine (2 of 2)]
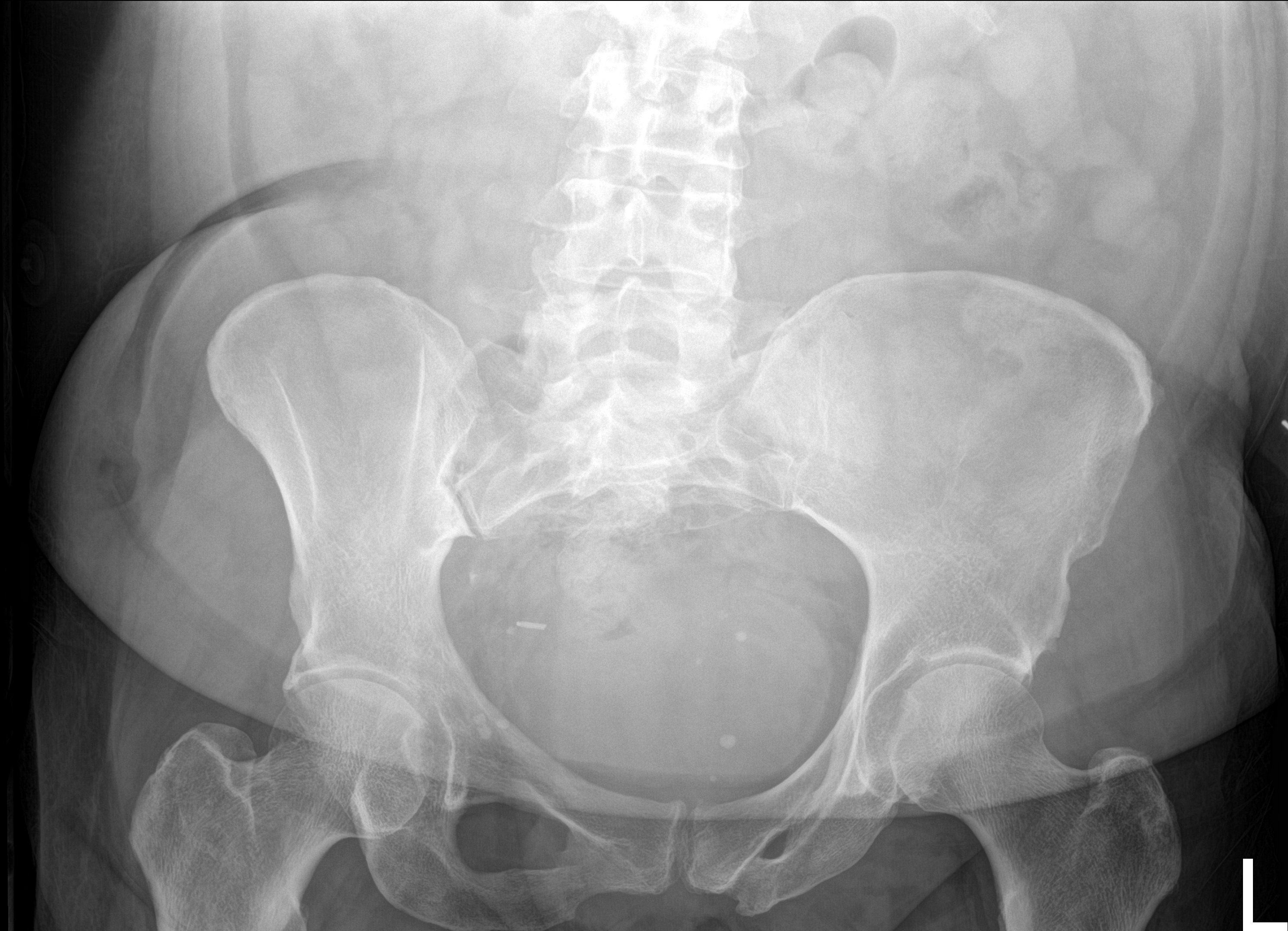

[4 of 4 positions shown; findings below may reference images not displayed]

FINDINGS: Insert clear Chest

No dilated loops of large or small bowel. Gas and stool in the
rectum. No pathologic calcifications. No organomegaly. No acute
osseous abnormality
IMPRESSION: 1.  No acute cardiopulmonary process.
2. No bowel obstruction or free air.
3. Post cholecystectomy.

## 2016-08-22 IMAGING — CT CT ABD-PELV W/ CM
2 of 5 series · 16 of 46 positions shown, 18 images · IV contrast (APPLIED)
Comparison: CT of the abdomen and pelvis performed 08/29/2015

CLINICAL DATA: Acute onset of diarrhea and abdominal pain. Nausea
and vomiting. Sepsis and fever. Initial encounter.

EXAM:
CT ABDOMEN AND PELVIS WITH CONTRAST
TECHNIQUE: Multidetector CT imaging of the abdomen and pelvis was performed
using the standard protocol following bolus administration of
intravenous contrast.
CONTRAST:  100mL [id number] IOPAMIDOL ([id number]) INJECTION 61%

[Series 2: axial st · axial · 0.90mm/px · z∈[-427,-2]mm · 13 of 97 slices shown, 15 images]
[im 6/97  soft-tissue]
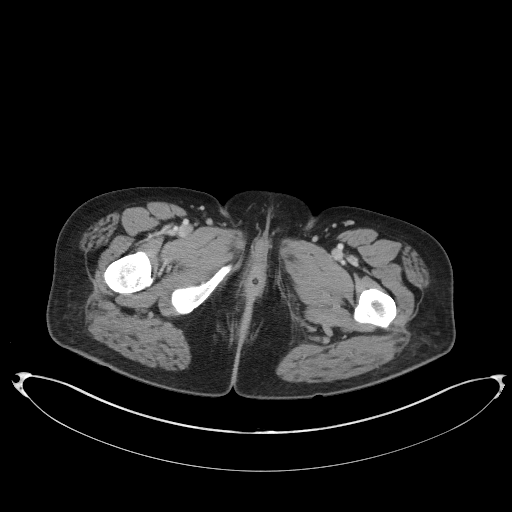
[im 6/97  bone]
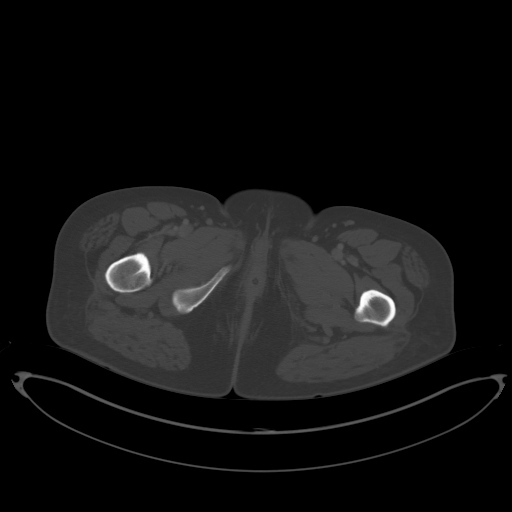
[im 11/97  soft-tissue]
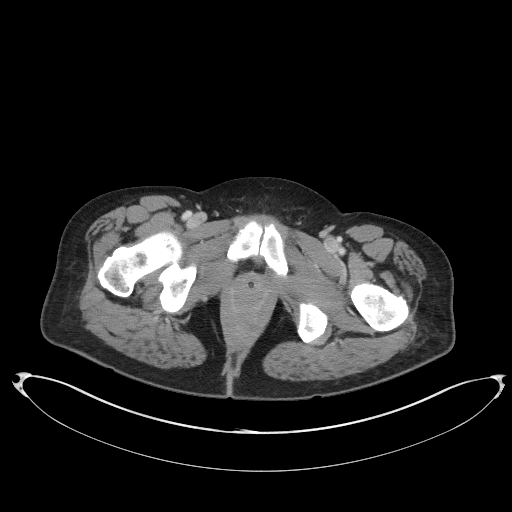
[im 22/97  soft-tissue]
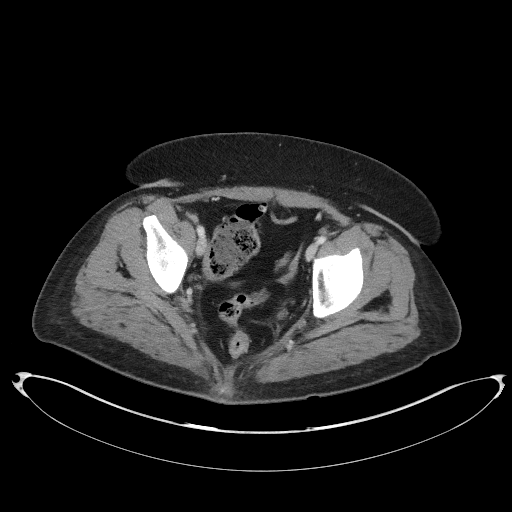
[im 27/97  soft-tissue]
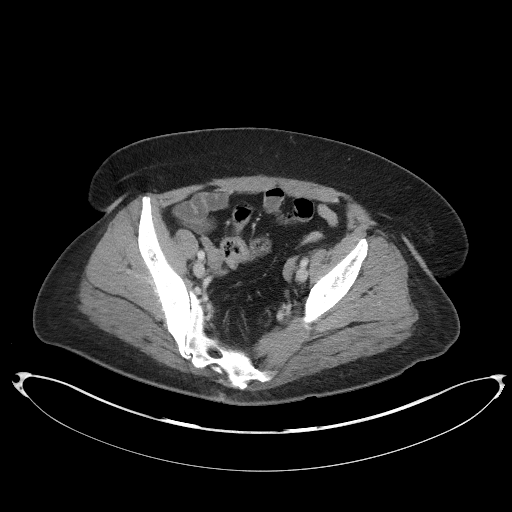
[im 33/97  soft-tissue]
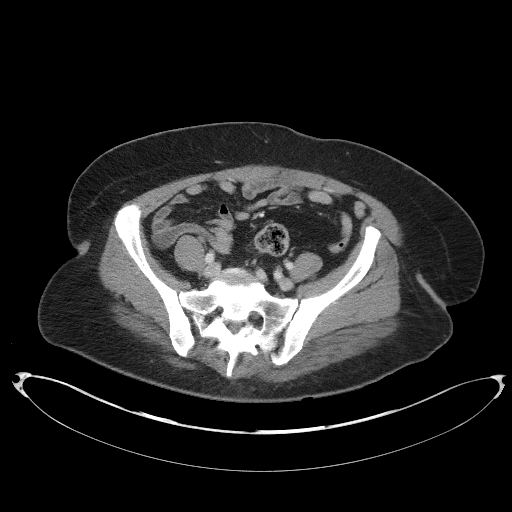
[im 43/97  soft-tissue]
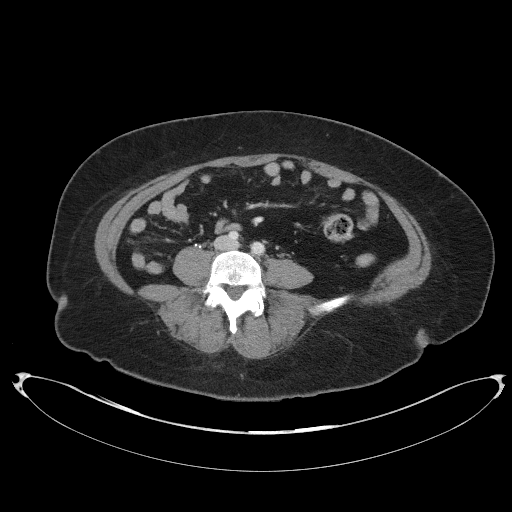
[im 49/97  soft-tissue]
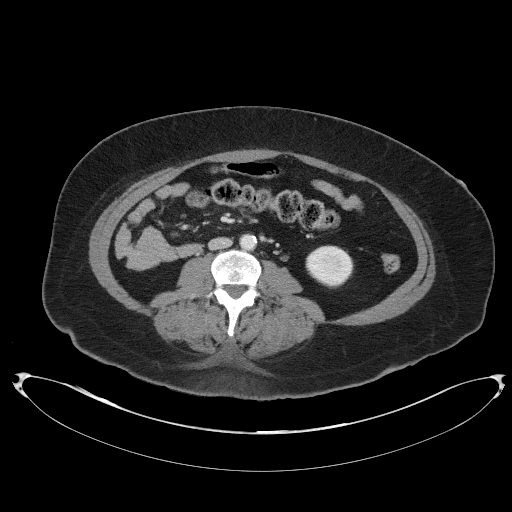
[im 54/97  soft-tissue]
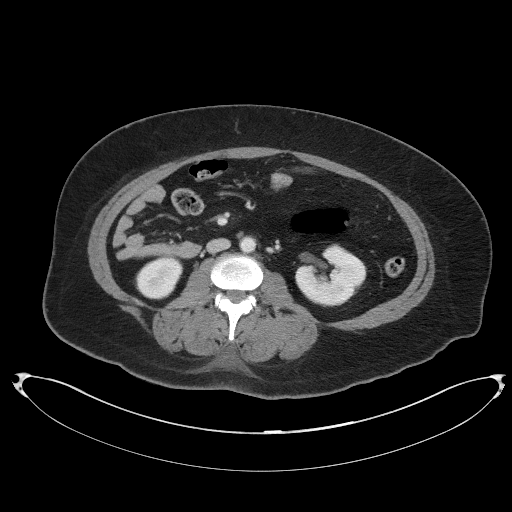
[im 65/97  soft-tissue]
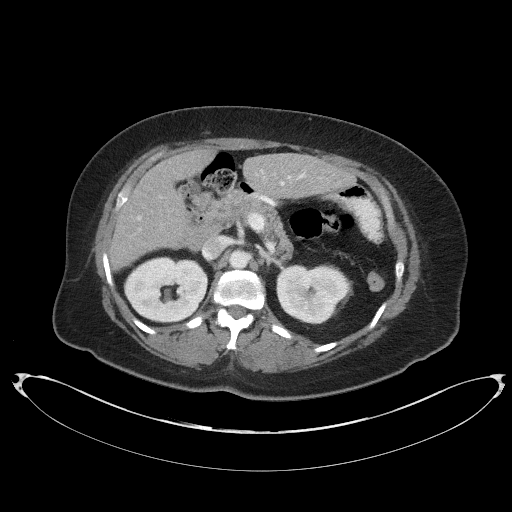
[im 65/97  bone]
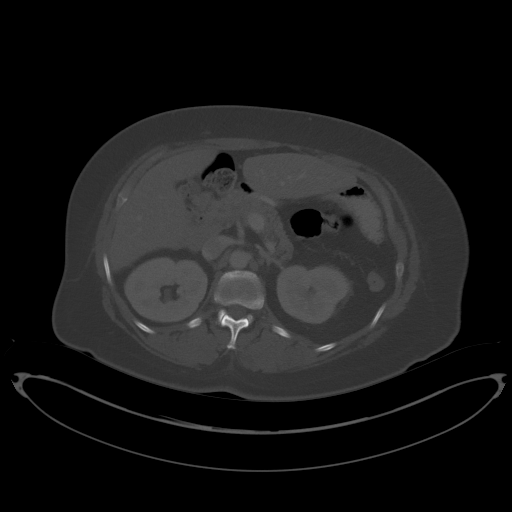
[im 70/97  soft-tissue]
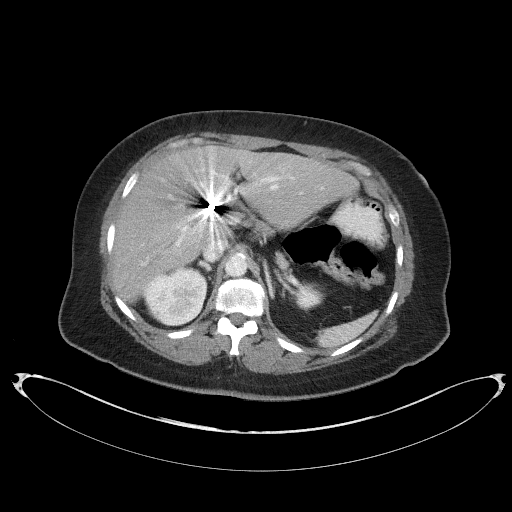
[im 75/97  soft-tissue]
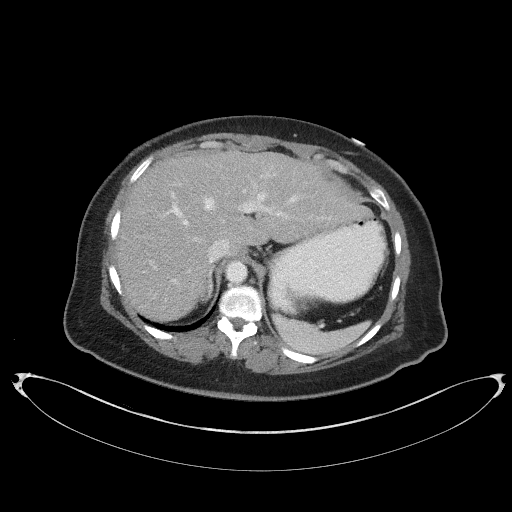
[im 86/97  soft-tissue]
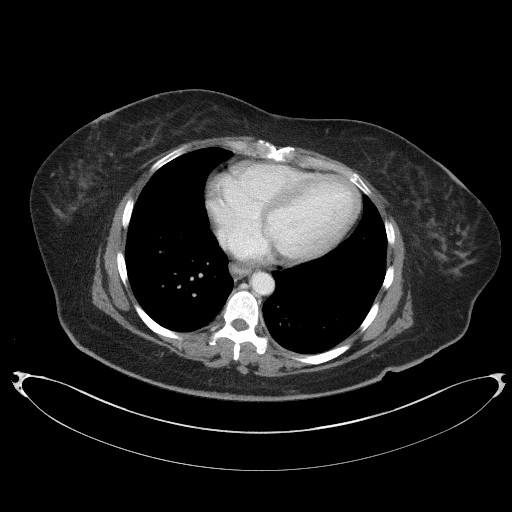
[im 91/97  soft-tissue]
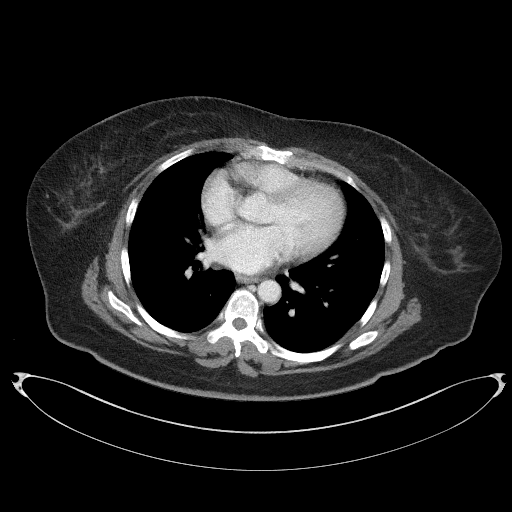

[Series 5: coronal st · coronal · 0.74mm/px · 3 of 83 slices shown]
[im 28/83  soft-tissue]
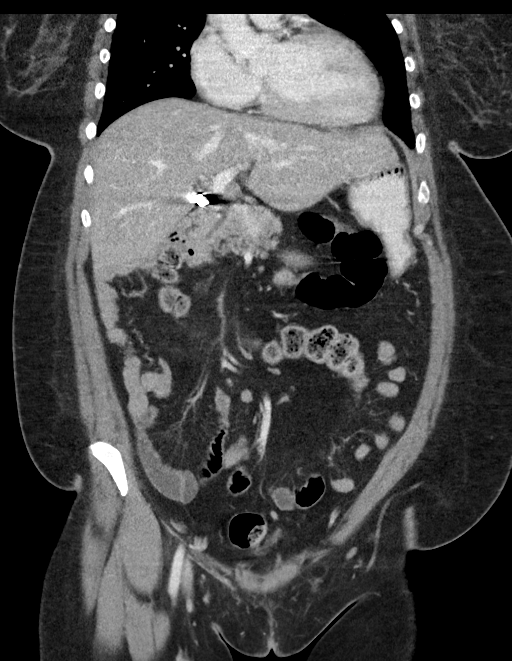
[im 37/83  soft-tissue]
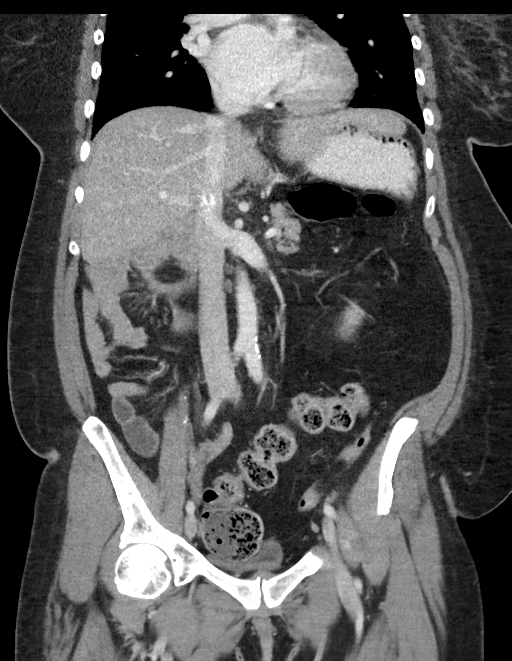
[im 46/83  soft-tissue]
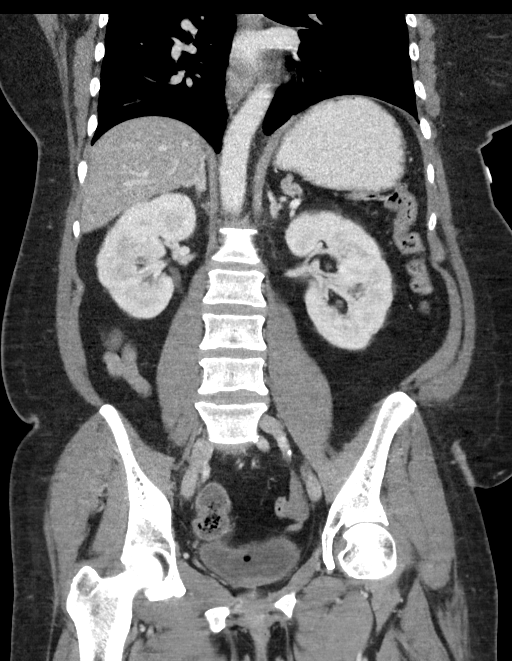

[16 of 46 positions shown; findings below may reference images not displayed]

FINDINGS: The visualized lung bases are clear.

The liver and spleen are unremarkable in appearance. The patient is
status post cholecystectomy, with clips noted at the gallbladder
fossa. The pancreas and adrenal glands are unremarkable.

Mild scarring is noted at the interpole region of the right kidney.
There is no evidence of hydronephrosis. No renal or ureteral stones
are seen. No perinephric stranding is appreciated.

No free fluid is identified. The small bowel is unremarkable in
appearance. The stomach is within normal limits. No acute vascular
abnormalities are seen. Minimal calcification are seen along the
abdominal aorta.

The appendix is not well seen; there is no evidence for
appendicitis. The ascending colon is somewhat redundant. The colon
is grossly unremarkable in appearance.

The bladder is mildly distended and contains a small amount of air,
with a Foley catheter in place. The patient is status post
hysterectomy. The ovaries are relatively symmetric. No suspicious
adnexal masses are seen. No inguinal lymphadenopathy is seen.

No acute osseous abnormalities are identified.
IMPRESSION: 1. No acute abnormality seen within the abdomen or pelvis.
2. Mild scarring at the interpole region of the right kidney.

## 2016-10-18 DIAGNOSIS — 419620001 Death: Secondary | SNOMED CT | POA: Diagnosis not present

## 2016-10-18 DEATH — deceased
